# Patient Record
Sex: Female | Born: 1935
Health system: Southern US, Community
[De-identification: ages and names within clinical notes are randomized; demographics above are authoritative.]

## PROBLEM LIST (undated history)

## (undated) DIAGNOSIS — M1990 Unspecified osteoarthritis, unspecified site: Secondary | ICD-10-CM

## (undated) DIAGNOSIS — I4891 Unspecified atrial fibrillation: Secondary | ICD-10-CM

## (undated) DIAGNOSIS — S82853A Displaced trimalleolar fracture of unspecified lower leg, initial encounter for closed fracture: Secondary | ICD-10-CM

## (undated) DIAGNOSIS — I700 Atherosclerosis of aorta: Secondary | ICD-10-CM

## (undated) DIAGNOSIS — J449 Chronic obstructive pulmonary disease, unspecified: Secondary | ICD-10-CM

## (undated) DIAGNOSIS — G629 Polyneuropathy, unspecified: Secondary | ICD-10-CM

## (undated) DIAGNOSIS — L089 Local infection of the skin and subcutaneous tissue, unspecified: Secondary | ICD-10-CM

## (undated) DIAGNOSIS — T148XXA Other injury of unspecified body region, initial encounter: Secondary | ICD-10-CM

## (undated) DIAGNOSIS — E039 Hypothyroidism, unspecified: Secondary | ICD-10-CM

## (undated) DIAGNOSIS — I509 Heart failure, unspecified: Secondary | ICD-10-CM

## (undated) DIAGNOSIS — L98429 Non-pressure chronic ulcer of back with unspecified severity: Secondary | ICD-10-CM

## (undated) HISTORY — DX: Polyneuropathy, unspecified: G62.9

## (undated) HISTORY — DX: Unspecified atrial fibrillation: I48.91

## (undated) HISTORY — DX: Hypothyroidism, unspecified: E03.9

## (undated) HISTORY — DX: Unspecified osteoarthritis, unspecified site: M19.90

## (undated) HISTORY — DX: Displaced trimalleolar fracture of unspecified lower leg, initial encounter for closed fracture: S82.853A

## (undated) HISTORY — PX: VAGINAL HYSTERECTOMY: SUR661

---

## 2004-06-17 ENCOUNTER — Ambulatory Visit: Payer: Self-pay | Admitting: Family Medicine

## 2004-07-08 ENCOUNTER — Ambulatory Visit: Payer: Self-pay | Admitting: Family Medicine

## 2004-08-19 ENCOUNTER — Ambulatory Visit: Payer: Self-pay | Admitting: Family Medicine

## 2004-09-22 ENCOUNTER — Ambulatory Visit: Payer: Self-pay | Admitting: Family Medicine

## 2004-10-22 ENCOUNTER — Ambulatory Visit: Payer: Self-pay | Admitting: Family Medicine

## 2005-05-28 ENCOUNTER — Ambulatory Visit: Payer: Self-pay | Admitting: Family Medicine

## 2005-08-19 ENCOUNTER — Ambulatory Visit (HOSPITAL_COMMUNITY): Admission: RE | Admit: 2005-08-19 | Discharge: 2005-08-19 | Payer: Self-pay | Admitting: Family Medicine

## 2007-01-31 ENCOUNTER — Encounter (HOSPITAL_COMMUNITY): Admission: RE | Admit: 2007-01-31 | Discharge: 2007-04-19 | Payer: Self-pay | Admitting: Neurology

## 2007-03-02 DIAGNOSIS — I4891 Unspecified atrial fibrillation: Secondary | ICD-10-CM | POA: Insufficient documentation

## 2007-03-02 DIAGNOSIS — E039 Hypothyroidism, unspecified: Secondary | ICD-10-CM | POA: Insufficient documentation

## 2007-03-02 DIAGNOSIS — H409 Unspecified glaucoma: Secondary | ICD-10-CM | POA: Insufficient documentation

## 2007-03-02 DIAGNOSIS — L408 Other psoriasis: Secondary | ICD-10-CM | POA: Insufficient documentation

## 2007-05-09 ENCOUNTER — Encounter (HOSPITAL_COMMUNITY): Admission: RE | Admit: 2007-05-09 | Discharge: 2007-08-07 | Payer: Self-pay | Admitting: Neurology

## 2007-12-26 ENCOUNTER — Encounter: Admission: RE | Admit: 2007-12-26 | Discharge: 2007-12-26 | Payer: Self-pay | Admitting: Family Medicine

## 2008-01-05 ENCOUNTER — Encounter: Admission: RE | Admit: 2008-01-05 | Discharge: 2008-01-05 | Payer: Self-pay | Admitting: Family Medicine

## 2008-02-07 ENCOUNTER — Emergency Department (HOSPITAL_COMMUNITY): Admission: EM | Admit: 2008-02-07 | Discharge: 2008-02-07 | Payer: Self-pay | Admitting: Emergency Medicine

## 2008-02-09 ENCOUNTER — Inpatient Hospital Stay (HOSPITAL_COMMUNITY): Admission: RE | Admit: 2008-02-09 | Discharge: 2008-02-11 | Payer: Self-pay | Admitting: Orthopedic Surgery

## 2010-11-16 ENCOUNTER — Emergency Department (HOSPITAL_COMMUNITY): Payer: Medicare Other

## 2010-11-16 ENCOUNTER — Inpatient Hospital Stay (HOSPITAL_COMMUNITY)
Admission: EM | Admit: 2010-11-16 | Discharge: 2010-11-29 | DRG: 492 | Disposition: A | Discharge status: Home Health | Payer: Medicare Other | Attending: Internal Medicine | Admitting: Internal Medicine

## 2010-11-16 DIAGNOSIS — Z7901 Long term (current) use of anticoagulants: Secondary | ICD-10-CM

## 2010-11-16 DIAGNOSIS — Z87311 Personal history of (healed) other pathological fracture: Secondary | ICD-10-CM

## 2010-11-16 DIAGNOSIS — D62 Acute posthemorrhagic anemia: Secondary | ICD-10-CM | POA: Diagnosis not present

## 2010-11-16 DIAGNOSIS — S82853A Displaced trimalleolar fracture of unspecified lower leg, initial encounter for closed fracture: Principal | ICD-10-CM | POA: Diagnosis present

## 2010-11-16 DIAGNOSIS — K644 Residual hemorrhoidal skin tags: Secondary | ICD-10-CM | POA: Diagnosis present

## 2010-11-16 DIAGNOSIS — D126 Benign neoplasm of colon, unspecified: Secondary | ICD-10-CM | POA: Diagnosis present

## 2010-11-16 DIAGNOSIS — E876 Hypokalemia: Secondary | ICD-10-CM | POA: Diagnosis not present

## 2010-11-16 DIAGNOSIS — E039 Hypothyroidism, unspecified: Secondary | ICD-10-CM | POA: Diagnosis present

## 2010-11-16 DIAGNOSIS — J9600 Acute respiratory failure, unspecified whether with hypoxia or hypercapnia: Secondary | ICD-10-CM | POA: Diagnosis not present

## 2010-11-16 DIAGNOSIS — I4891 Unspecified atrial fibrillation: Secondary | ICD-10-CM | POA: Diagnosis present

## 2010-11-16 DIAGNOSIS — F329 Major depressive disorder, single episode, unspecified: Secondary | ICD-10-CM | POA: Diagnosis present

## 2010-11-16 DIAGNOSIS — H9190 Unspecified hearing loss, unspecified ear: Secondary | ICD-10-CM | POA: Diagnosis present

## 2010-11-16 DIAGNOSIS — K648 Other hemorrhoids: Secondary | ICD-10-CM | POA: Diagnosis present

## 2010-11-16 DIAGNOSIS — W010XXA Fall on same level from slipping, tripping and stumbling without subsequent striking against object, initial encounter: Secondary | ICD-10-CM | POA: Diagnosis present

## 2010-11-16 DIAGNOSIS — M810 Age-related osteoporosis without current pathological fracture: Secondary | ICD-10-CM | POA: Diagnosis present

## 2010-11-16 DIAGNOSIS — G609 Hereditary and idiopathic neuropathy, unspecified: Secondary | ICD-10-CM | POA: Diagnosis present

## 2010-11-16 DIAGNOSIS — F3289 Other specified depressive episodes: Secondary | ICD-10-CM | POA: Diagnosis present

## 2010-11-16 DIAGNOSIS — J189 Pneumonia, unspecified organism: Secondary | ICD-10-CM | POA: Diagnosis not present

## 2010-11-16 DIAGNOSIS — Y92009 Unspecified place in unspecified non-institutional (private) residence as the place of occurrence of the external cause: Secondary | ICD-10-CM

## 2010-11-16 HISTORY — PX: COLONOSCOPY W/ POLYPECTOMY: SHX1380

## 2010-11-16 LAB — CBC
HCT: 36.9 % (ref 36.0–46.0)
HCT: 34.7 % — ABNORMAL LOW (ref 36.0–46.0)
Hemoglobin: 11.2 g/dL — ABNORMAL LOW (ref 12.0–15.0)
MCH: 30.9 pg (ref 26.0–34.0)
MCH: 29.9 pg (ref 26.0–34.0)
MCV: 94.1 fL (ref 78.0–100.0)
MCV: 92.5 fL (ref 78.0–100.0)
RBC: 3.92 MIL/uL (ref 3.87–5.11)
RBC: 3.75 MIL/uL — ABNORMAL LOW (ref 3.87–5.11)
RDW: 12.8 % (ref 11.5–15.5)
WBC: 8.7 10*3/uL (ref 4.0–10.5)

## 2010-11-16 LAB — DIFFERENTIAL
Eosinophils Relative: 1 % (ref 0–5)
Lymphocytes Relative: 6 % — ABNORMAL LOW (ref 12–46)
Lymphs Abs: 0.6 10*3/uL — ABNORMAL LOW (ref 0.7–4.0)
Monocytes Relative: 4 % (ref 3–12)

## 2010-11-16 LAB — BASIC METABOLIC PANEL
BUN: 25 mg/dL — ABNORMAL HIGH (ref 6–23)
CO2: 24 mEq/L (ref 19–32)
Chloride: 108 mEq/L (ref 96–112)
Creatinine, Ser: 1.23 mg/dL — ABNORMAL HIGH (ref 0.4–1.2)
Glucose, Bld: 113 mg/dL — ABNORMAL HIGH (ref 70–99)

## 2010-11-16 LAB — POCT I-STAT, CHEM 8
BUN: 28 mg/dL — ABNORMAL HIGH (ref 6–23)
Calcium, Ion: 1.24 mmol/L (ref 1.12–1.32)
Chloride: 108 mEq/L (ref 96–112)
Glucose, Bld: 118 mg/dL — ABNORMAL HIGH (ref 70–99)
TCO2: 25 mmol/L (ref 0–100)

## 2010-11-17 LAB — PROTIME-INR: INR: 2.44 — ABNORMAL HIGH (ref 0.00–1.49)

## 2010-11-18 LAB — BASIC METABOLIC PANEL
BUN: 15 mg/dL (ref 6–23)
Chloride: 105 mEq/L (ref 96–112)
Potassium: 3.7 mEq/L (ref 3.5–5.1)

## 2010-11-18 LAB — CBC
HCT: 31.1 % — ABNORMAL LOW (ref 36.0–46.0)
Hemoglobin: 10 g/dL — ABNORMAL LOW (ref 12.0–15.0)
MCH: 30.5 pg (ref 26.0–34.0)
MCHC: 32.2 g/dL (ref 30.0–36.0)
MCV: 94.8 fL (ref 78.0–100.0)

## 2010-11-18 LAB — PROTIME-INR: INR: 3.03 — ABNORMAL HIGH (ref 0.00–1.49)

## 2010-11-18 LAB — BRAIN NATRIURETIC PEPTIDE: Pro B Natriuretic peptide (BNP): 293 pg/mL — ABNORMAL HIGH (ref 0.0–100.0)

## 2010-11-19 LAB — HEPARIN LEVEL (UNFRACTIONATED): Heparin Unfractionated: <0.1 IU/mL — ABNORMAL LOW (ref 0.30–0.70)

## 2010-11-19 NOTE — H&P (Signed)
[name], [name]                ACCOUNT NO.:  [id number]  MEDICAL RECORD NO.:  [id number]           PATIENT TYPE:  I  LOCATION:  1440                         FACILITY:  [facility]  PHYSICIAN:  Della Goo, M.D. DATE OF BIRTH:  August 13, 1927  DATE OF ADMISSION:  11/16/2010 DATE OF DISCHARGE:                             HISTORY & PHYSICAL   PRIMARY CARE PHYSICIAN:  Dr. Aida Puffer.  CHIEF COMPLAINT:  Fall, left ankle pain.  HISTORY OF PRESENT ILLNESS:  This is an 77 year old female who was brought to the emergency department emergently after suffering a fall. The patient denies having any syncope or dizziness associated with the fall.  She reports having neuropathy and occasionally falls and fell this afternoon and suffered intense left ankle pain.  EMS was called. The patient was brought to the emergency department, evaluated and x- rays were performed which revealed a left trimalleolar fracture, dislocation with oblique fracture of the distal fibular diaphysis and this was noted as being mildly comminuted.  The orthopedic service was called.  The patient regularly sees Dr. Brynda Greathouse and Dr. Priscille Kluver was the on-call consultant.  The patient was referred for medical service for admission since she has multiple medical problems and is on Coumadin therapy.  There are no plans for immediate surgery at this time.  The patient was placed in a splint in the emergency department.  PAST MEDICAL HISTORY:  Significant for: 1. Atrial fibrillation, on chronic Coumadin therapy. 2. Hypothyroidism. 3. Glaucoma. 4. Peripheral neuropathy. 5. Osteoporosis. 6. Osteoarthritis. 7. She is extremely hard-of-hearing and has bilateral hearing aids.  PAST SURGICAL HISTORY: 1. History of hysterectomy. 2. History of an open reduction internal fixation of the right lower     tibia and fibula fracture.  MEDICATIONS:  Her medications at this time include: 1. Alendronate 70 mg 1 p.o. q. week. 2. Coumadin  2.5 mg on Mondays, Wednesdays, Fridays and 5 mg on the     other days. 3. Vitamin D2 50,000 units once weekly. 4. Fluoxetine 20 mg one p.o. daily. 5. Calcium tablets 1 p.o. b.i.d. 6. Levothyroxine 112 mcg 1 p.o. daily. 7. Oxycodone/APAP 5/225 one p.o. b.i.d. p.r.n. pain. 8. Pilocarpine ophthalmic drops 4%, 1 drop both eyes twice daily. 9. Travatan ophthalmic drops 0.004% 1 drop to both eyes once daily. 10.Dorzolamide/Timolol 2.23%/0.66% 1 drop both eyes twice daily. 11.The patient is on an eye vitamin that she takes daily.  ALLERGIES:  No known drug allergies.  SOCIAL HISTORY:  The patient lives alone.  She is a nonsmoker, nondrinker.  No history of illicit drug usage.  FAMILY HISTORY:  Noncontributory.  REVIEW OF SYSTEMS:  Pertinents mentioned above.  They are all negative.  PHYSICAL EXAMINATION FINDINGS:  GENERAL:  This is an elderly thin, frail 77 year old Caucasian female who is in no acute distress. VITAL SIGNS:  Temperature 97.4, blood pressure 131/90, heart rate 84, respirations 20, O2 sats 95 to 97%. HEENT EXAMINATION:  Normocephalic, atraumatic.  Pupils equally round and reactive to light.  Extraocular movements intact.  Funduscopic unable to visualize at this time.  Nares are patent bilaterally.  Oropharynx is clear. NECK:  Supple.  Full range of motion.  No thyromegaly, adenopathy, jugular venous distention. CARDIOVASCULAR:  Irregular rhythm.  No murmurs, gallops or rubs appreciated. LUNGS:  Clear to auscultation bilaterally.  No rales, rhonchi or wheezes.  Normal excursion bilaterally.  Breathing is unlabored. ABDOMEN:  Positive bowel sounds, soft, nontender, nondistended.  No hepatosplenomegaly. EXTREMITIES:  The right lower extremity is without cyanosis, clubbing or edema.  The left lower extremity is immobilized at this time in a temporary splint/cast. NEUROLOGIC EXAMINATION:  Grossly nonfocal except for her hearing and visual deficits.  LABORATORY STUDIES:   White blood cell count 8.7, hemoglobin 12.1, hematocrit 36.9, MCV 94.1, platelets 197, neutrophils 89%, lymphocytes 6%.  Sodium 141, potassium 4.3, chloride 108.  CO2 25, BUN 28, creatinine 1.4 and glucose 118, ionized calcium 1.24.  Protime 25.9, INR 2.36.  Left ankle x-ray as mentioned above in the HPI.  Chest x-ray performed revealed clear lung fields.  ASSESSMENT:  An 77 year old female being admitted with: 1. Left ankle fracture, status post mechanical fall. 2. Peripheral neuropathy. 3. Atrial fibrillation, on chronic Coumadin therapy. 4. Coagulopathy secondary to Coumadin therapy. 5. Osteoporosis. 6. Hypothyroidism. 7. Glaucoma.  PLAN:  The patient will be admitted to telemetry area for monitoring. The patient will be placed on pain medication as needed for her pain. The patient's regular medications will be reconciled and the orthopedic service will see the patient for formal consultation in the a.m.  Dr. Brynda Greathouse is on call coverage.  The patient will continue on her Coumadin therapy until concrete plans for surgical intervention have been made.  A TSH level will be ordered and laboratory studies will be ordered in the a.m. and corrected as needed.  The patient is a full code at this time.     Della Goo, M.D.     HJ/MEDQ  D:  11/16/2010  T:  11/16/2010  Job:  [id number]  cc:   Aida Puffer Fax: [phone]  Electronically Signed by Della Goo M.D. on 11/19/2010 06:28:46 AM

## 2010-11-20 LAB — HEPARIN LEVEL (UNFRACTIONATED)
Heparin Unfractionated: 0.26 IU/mL — ABNORMAL LOW (ref 0.30–0.70)
Heparin Unfractionated: 0.46 IU/mL (ref 0.30–0.70)

## 2010-11-20 LAB — PROTIME-INR: INR: 1.53 — ABNORMAL HIGH (ref 0.00–1.49)

## 2010-11-21 ENCOUNTER — Inpatient Hospital Stay (HOSPITAL_COMMUNITY): Payer: Medicare Other

## 2010-11-21 DIAGNOSIS — J9600 Acute respiratory failure, unspecified whether with hypoxia or hypercapnia: Secondary | ICD-10-CM

## 2010-11-21 DIAGNOSIS — J810 Acute pulmonary edema: Secondary | ICD-10-CM

## 2010-11-21 LAB — BLOOD GAS, ARTERIAL
Acid-base deficit: 11.6 mmol/L — ABNORMAL HIGH (ref 0.0–2.0)
Acid-base deficit: 5.8 mmol/L — ABNORMAL HIGH (ref 0.0–2.0)
Acid-base deficit: 3.6 mmol/L — ABNORMAL HIGH (ref 0.0–2.0)
Bicarbonate: 21.3 mEq/L (ref 20.0–24.0)
Drawn by: 30996
Drawn by: 103701
FIO2: 0.4 %
MECHVT: 500 mL
MECHVT: 500 mL
O2 Saturation: 98.3 %
O2 Saturation: 99.4 %
Patient temperature: 98.6
Patient temperature: 98.6
RATE: 14 resp/min
RATE: 14 resp/min
TCO2: 22.2 mmol/L (ref 0–100)
TCO2: 18.8 mmol/L (ref 0–100)
pH, Arterial: 6.982 — CL (ref 7.350–7.400)
pH, Arterial: 7.296 — ABNORMAL LOW (ref 7.350–7.400)

## 2010-11-21 LAB — CBC
MCH: 31.2 pg (ref 26.0–34.0)
MCHC: 33.8 g/dL (ref 30.0–36.0)
MCV: 92.3 fL (ref 78.0–100.0)
Platelets: 191 10*3/uL (ref 150–400)
RDW: 12.6 % (ref 11.5–15.5)
WBC: 5.2 10*3/uL (ref 4.0–10.5)

## 2010-11-21 LAB — BASIC METABOLIC PANEL
BUN: 9 mg/dL (ref 6–23)
Calcium: 8.6 mg/dL (ref 8.4–10.5)
Creatinine, Ser: 0.84 mg/dL (ref 0.4–1.2)
GFR calc non Af Amer: >60 mL/min (ref >60)

## 2010-11-21 LAB — CARDIAC PANEL(CRET KIN+CKTOT+MB+TROPI): Troponin I: 0.03 ng/mL (ref 0.00–0.06)

## 2010-11-21 LAB — ABO/RH: ABO/RH(D): A POS

## 2010-11-21 LAB — TYPE AND SCREEN: ABO/RH(D): A POS

## 2010-11-21 LAB — HEPARIN LEVEL (UNFRACTIONATED): Heparin Unfractionated: 0.51 IU/mL (ref 0.30–0.70)

## 2010-11-22 ENCOUNTER — Inpatient Hospital Stay (HOSPITAL_COMMUNITY): Payer: Medicare Other

## 2010-11-22 DIAGNOSIS — I4891 Unspecified atrial fibrillation: Secondary | ICD-10-CM

## 2010-11-22 DIAGNOSIS — I059 Rheumatic mitral valve disease, unspecified: Secondary | ICD-10-CM

## 2010-11-22 LAB — CBC
MCH: 30.1 pg (ref 26.0–34.0)
MCHC: 32.3 g/dL (ref 30.0–36.0)
MCV: 93.2 fL (ref 78.0–100.0)
Platelets: 217 10*3/uL (ref 150–400)
RDW: 12.6 % (ref 11.5–15.5)

## 2010-11-22 LAB — CARDIAC PANEL(CRET KIN+CKTOT+MB+TROPI)
Total CK: 175 U/L (ref 7–177)
Troponin I: 0.05 ng/mL (ref 0.00–0.06)

## 2010-11-22 LAB — BLOOD GAS, ARTERIAL
Acid-base deficit: 1.6 mmol/L (ref 0.0–2.0)
Drawn by: 340271
FIO2: 0.3 %
RATE: 14 resp/min
pCO2 arterial: 29.7 mmHg — ABNORMAL LOW (ref 35.0–45.0)
pO2, Arterial: 98.4 mmHg (ref 80.0–100.0)

## 2010-11-22 LAB — HEPARIN LEVEL (UNFRACTIONATED): Heparin Unfractionated: 0.44 IU/mL (ref 0.30–0.70)

## 2010-11-22 LAB — COMPREHENSIVE METABOLIC PANEL
AST: 178 U/L — ABNORMAL HIGH (ref 0–37)
BUN: 9 mg/dL (ref 6–23)
CO2: 23 mEq/L (ref 19–32)
Chloride: 105 mEq/L (ref 96–112)
Creatinine, Ser: 0.91 mg/dL (ref 0.4–1.2)
GFR calc non Af Amer: 59 mL/min — ABNORMAL LOW (ref >60)
Total Bilirubin: 1.2 mg/dL (ref 0.3–1.2)

## 2010-11-22 LAB — URINALYSIS, ROUTINE W REFLEX MICROSCOPIC
Glucose, UA: NEGATIVE mg/dL
Ketones, ur: 40 mg/dL — AB
Protein, ur: NEGATIVE mg/dL

## 2010-11-22 LAB — BRAIN NATRIURETIC PEPTIDE: Pro B Natriuretic peptide (BNP): 496 pg/mL — ABNORMAL HIGH (ref 0.0–100.0)

## 2010-11-22 LAB — VITAMIN B12: Vitamin B-12: 1091 pg/mL — ABNORMAL HIGH (ref 211–911)

## 2010-11-22 LAB — IRON AND TIBC: Iron: 22 ug/dL — ABNORMAL LOW (ref 42–135)

## 2010-11-23 ENCOUNTER — Inpatient Hospital Stay (HOSPITAL_COMMUNITY): Payer: Medicare Other

## 2010-11-23 DIAGNOSIS — I4891 Unspecified atrial fibrillation: Secondary | ICD-10-CM

## 2010-11-23 LAB — URINE CULTURE
Colony Count: NO GROWTH
Culture: NO GROWTH
Special Requests: NEGATIVE

## 2010-11-23 LAB — BASIC METABOLIC PANEL
BUN: 9 mg/dL (ref 6–23)
CO2: 24 mEq/L (ref 19–32)
Chloride: 106 mEq/L (ref 96–112)
Creatinine, Ser: 1.01 mg/dL (ref 0.4–1.2)
Glucose, Bld: 76 mg/dL (ref 70–99)

## 2010-11-23 LAB — CBC
MCH: 30 pg (ref 26.0–34.0)
MCHC: 32.6 g/dL (ref 30.0–36.0)
MCV: 92.2 fL (ref 78.0–100.0)
Platelets: 197 10*3/uL (ref 150–400)
RDW: 12.8 % (ref 11.5–15.5)
WBC: 6.2 10*3/uL (ref 4.0–10.5)

## 2010-11-23 LAB — CARDIAC PANEL(CRET KIN+CKTOT+MB+TROPI): Relative Index: 0.4 (ref 0.0–2.5)

## 2010-11-24 DIAGNOSIS — I4891 Unspecified atrial fibrillation: Secondary | ICD-10-CM

## 2010-11-24 DIAGNOSIS — J9600 Acute respiratory failure, unspecified whether with hypoxia or hypercapnia: Secondary | ICD-10-CM

## 2010-11-24 DIAGNOSIS — J810 Acute pulmonary edema: Secondary | ICD-10-CM

## 2010-11-24 LAB — CULTURE, BAL-QUANTITATIVE W GRAM STAIN: Colony Count: NO GROWTH

## 2010-11-24 LAB — BASIC METABOLIC PANEL
CO2: 27 mEq/L (ref 19–32)
Calcium: 8.5 mg/dL (ref 8.4–10.5)
Chloride: 107 mEq/L (ref 96–112)
Glucose, Bld: 93 mg/dL (ref 70–99)
Sodium: 139 mEq/L (ref 135–145)

## 2010-11-24 LAB — CBC
Hemoglobin: 8 g/dL — ABNORMAL LOW (ref 12.0–15.0)
MCH: 30.3 pg (ref 26.0–34.0)
MCHC: 32.7 g/dL (ref 30.0–36.0)
MCV: 92.8 fL (ref 78.0–100.0)

## 2010-11-24 LAB — CARDIAC PANEL(CRET KIN+CKTOT+MB+TROPI): CK, MB: 1.8 ng/mL (ref 0.3–4.0)

## 2010-11-24 LAB — HEPARIN LEVEL (UNFRACTIONATED): Heparin Unfractionated: 0.34 IU/mL (ref 0.30–0.70)

## 2010-11-25 ENCOUNTER — Inpatient Hospital Stay (HOSPITAL_COMMUNITY): Payer: Medicare Other

## 2010-11-25 LAB — BASIC METABOLIC PANEL
CO2: 25 mEq/L (ref 19–32)
Calcium: 8.9 mg/dL (ref 8.4–10.5)
Chloride: 111 mEq/L (ref 96–112)
GFR calc Af Amer: >60 mL/min (ref >60)
Glucose, Bld: 97 mg/dL (ref 70–99)
Potassium: 3.5 mEq/L (ref 3.5–5.1)
Sodium: 142 mEq/L (ref 135–145)

## 2010-11-25 LAB — CBC
HCT: 25.3 % — ABNORMAL LOW (ref 36.0–46.0)
Hemoglobin: 8.3 g/dL — ABNORMAL LOW (ref 12.0–15.0)
MCH: 30.5 pg (ref 26.0–34.0)
MCHC: 32.8 g/dL (ref 30.0–36.0)
MCV: 93 fL (ref 78.0–100.0)
RBC: 2.72 MIL/uL — ABNORMAL LOW (ref 3.87–5.11)

## 2010-11-26 LAB — BASIC METABOLIC PANEL
CO2: 25 mEq/L (ref 19–32)
Chloride: 108 mEq/L (ref 96–112)
Creatinine, Ser: 0.88 mg/dL (ref 0.4–1.2)
GFR calc Af Amer: >60 mL/min (ref >60)
Glucose, Bld: 91 mg/dL (ref 70–99)

## 2010-11-26 LAB — DIFFERENTIAL
Basophils Absolute: 0 10*3/uL (ref 0.0–0.1)
Basophils Relative: 0 % (ref 0–1)
Eosinophils Relative: 3 % (ref 0–5)
Lymphocytes Relative: 21 % (ref 12–46)
Lymphs Abs: 1.3 10*3/uL (ref 0.7–4.0)
Monocytes Relative: 10 % (ref 3–12)
Neutro Abs: 3.9 10*3/uL (ref 1.7–7.7)

## 2010-11-26 LAB — CBC
HCT: 19.6 % — ABNORMAL LOW (ref 36.0–46.0)
Hemoglobin: 6.5 g/dL — CL (ref 12.0–15.0)
MCH: 30.4 pg (ref 26.0–34.0)
RBC: 2.14 MIL/uL — ABNORMAL LOW (ref 3.87–5.11)

## 2010-11-26 LAB — PROTIME-INR
INR: 1.48 (ref 0.00–1.49)
Prothrombin Time: 18.1 seconds — ABNORMAL HIGH (ref 11.6–15.2)

## 2010-11-26 LAB — TSH: TSH: 0.205 u[IU]/mL — ABNORMAL LOW (ref 0.350–4.500)

## 2010-11-27 DIAGNOSIS — D649 Anemia, unspecified: Secondary | ICD-10-CM

## 2010-11-27 DIAGNOSIS — K921 Melena: Secondary | ICD-10-CM

## 2010-11-27 LAB — CROSSMATCH
ABO/RH(D): A POS
Antibody Screen: NEGATIVE
Unit division: 0

## 2010-11-27 LAB — CBC
HCT: 32 % — ABNORMAL LOW (ref 36.0–46.0)
MCH: 29.1 pg (ref 26.0–34.0)
MCHC: 32.8 g/dL (ref 30.0–36.0)
MCV: 88.6 fL (ref 78.0–100.0)
RDW: 15 % (ref 11.5–15.5)

## 2010-11-27 LAB — BASIC METABOLIC PANEL
BUN: 5 mg/dL — ABNORMAL LOW (ref 6–23)
CO2: 24 mEq/L (ref 19–32)
Chloride: 111 mEq/L (ref 96–112)
Creatinine, Ser: 0.81 mg/dL (ref 0.4–1.2)
Glucose, Bld: 94 mg/dL (ref 70–99)

## 2010-11-27 NOTE — Consult Note (Signed)
NAMEFRADEL, BALDONADO                ACCOUNT NO.:  [id number]  MEDICAL RECORD NO.:  [id number]           PATIENT TYPE:  I  LOCATION:  1231                         FACILITY:  [facility]  PHYSICIAN:  Cassell Clement, M.D. DATE OF BIRTH:  1927-06-15  DATE OF CONSULTATION:  11/22/2010 DATE OF DISCHARGE:                                CONSULTATION   HISTORY:  We were asked to see this pleasant 77 year old Caucasian female in regard to management of her postoperative atrial fibrillation with rapid ventricular response.  The patient was admitted on November 16, 2010, with a fractured ankle.  She underwent surgery on her ankle on November 20, 2010.  Postoperatively, she required reintubation in the PACU because of increased respiratory distress secondary to atrial fibrillation with a rapid ventricular response and probable congestive heart failure.  She remains on a ventilator today.  Overnight, her ventricular response remained rapid and she is now on an IV Cardizem drip at 5 mg per minute.  The patient does have a history of chronic atrial fibrillation.  She is a former patient of Dr. Alonza Smoker.  He attempted to have her see a cardiologist years ago but she refused.  She has been on long-term Coumadin.  She now sees Dr. Aida Puffer in [place] who manages her Coumadin.  He has also talked to her about seeing a cardiologist and having a workup of her atrial fibrillation but she has refused.  The patient has been on long-term warfarin but has not been on any long-term AV blocking drugs.  The patient does not have any history of known angina pectoris, according to the family.  She does not have any history of a myocardial infarction.  The patient has been noted to have exertional dyspnea by the family.  The patient is relatively sedentary but walks with a walker at home and manages her own household at home.  The patient does not have any history of diabetes mellitus. She does have a history of a severe  idiopathic peripheral neuropathy which was unresponsive to IV gammaglobulin or to p.o. Neurontin.  She previously had seen Dr. Avie Echevaria for evaluation and treatment of her neuropathy.  The patient does not have any past history of high blood pressure, according to the family.  FAMILY HISTORY:  Her family history is positive for congestive heart failure and myocardial infarctions in her parents and in the brother.  SOCIAL HISTORY:  Reveals that she does not use alcohol or tobacco.  She is a widow.  She has 2 children, in good health.  She lives by herself but family members live nearby.  PAST SURGICAL HISTORY:  Positive for open reduction and internal fixation of a right lower tibia and fibula fracture several years ago and since then she has walked with a walker.  The patient also has a history of remote total hysterectomy.  PAST MEDICAL HISTORY:  Positive for hypothyroidism, glaucoma, peripheral neuropathy, osteoporosis with compression fractures, osteoarthritis, atrial fibrillation and on Coumadin, and extremely hard of hearing and wears bilateral hearing aids.  REVIEW OF SYSTEMS:  Not obtainable at present since the patient is on  a ventilator and is sedated.  PHYSICAL EXAMINATION:  VITAL SIGNS:  On physical examination, blood pressure is 106/66, pulse is 106 on IV Cardizem 5 mg per hour.  The patient is sedated on a vent.  She is in no respiratory distress.  Color is good. SKIN:  Warm and dry. HEENT:  Head and neck exam unremarkable.  Carotids reveal no bruits. The jugular venous pressure is normal.  The thyroid is prominent.  There is no lymphadenopathy. CHEST:  The chest reveals a few scattered rhonchi. HEART:  Reveals no murmur, gallop, rub or click.  Aortic closure sound is crisp. ABDOMEN:  The abdomen is soft and nontender without palpable masses or organomegaly. EXTREMITIES:  Show 1+ right dorsalis pedis pulse.  Left foot is a cast  LABORATORY DATA AND RADIOLOGIC  STUDIES:  Chest x-ray shows mild congestive heart failure, heart size is upper limit of normal.  Laboratory work includes sodium 134, potassium 4.1, BUN 9, creatinine 0.91.  Her B natriuretic peptide is elevated at 496, hemoglobin is 10.1, CK-MB is 1.8 and troponin 0.03 yesterday with repeat values pending today.  Her electrocardiogram dated November 22, 2010, shows atrial fib with a rapid ventricular response and she has increased lateral ST-segment depression when compared to previous EKGs.  She has occasional PVCs.  IMPRESSION: 1. Chronic atrial fibrillation, now with rapid ventricular response     secondary to stress of surgery, rule out contributing causes such     as "silent" myocardial infarction and repeat cardiac enzymes have     been ordered for this morning. 2. Congestive heart failure, etiology to be determined and her echo     has been done and will be reviewed later today at [facility]  RECOMMENDATIONS:  Continue rate control with low-dose IV Cardizem at this point.  If rate increases further later, we can consider increasing the dose of Cardizem of her soft blood pressure will allow it.  Would continue gentle diuresis with Lasix.  We will follow with you.  Her long- term goal will be to reestablish to oral anticoagulation with Coumadin and to assure adequate rate control with oral agent  Many thanks for the opportunity to see this pleasant woman with you.          ______________________________ Cassell Clement, M.D.     TB/MEDQ  D:  11/22/2010  T:  11/22/2010  Job:  [id number]  cc:   Charlcie Cradle. Delford Field, MD, FCCP [address] [place] [address]  Aida Puffer Fax: [phone]  Toni Arthurs, MD Fax: [phone]  Electronically Signed by Cassell Clement M.D. on 11/27/2010 12:32:17 PM

## 2010-11-28 ENCOUNTER — Other Ambulatory Visit: Payer: Self-pay | Admitting: Gastroenterology

## 2010-11-28 DIAGNOSIS — D126 Benign neoplasm of colon, unspecified: Secondary | ICD-10-CM

## 2010-11-28 DIAGNOSIS — K5730 Diverticulosis of large intestine without perforation or abscess without bleeding: Secondary | ICD-10-CM

## 2010-11-28 LAB — CBC
HCT: 36.8 % (ref 36.0–46.0)
MCHC: 33.4 g/dL (ref 30.0–36.0)
MCV: 89.3 fL (ref 78.0–100.0)
RDW: 14.8 % (ref 11.5–15.5)
WBC: 6.9 10*3/uL (ref 4.0–10.5)

## 2010-11-28 LAB — CULTURE, BLOOD (ROUTINE X 2)
Culture  Setup Time: 201204071719
Culture: NO GROWTH

## 2010-11-29 LAB — BASIC METABOLIC PANEL
CO2: 22 mEq/L (ref 19–32)
Chloride: 110 mEq/L (ref 96–112)
Creatinine, Ser: 0.78 mg/dL (ref 0.4–1.2)
GFR calc Af Amer: >60 mL/min (ref >60)

## 2010-11-29 LAB — CBC
HCT: 36.6 % (ref 36.0–46.0)
MCV: 88.8 fL (ref 78.0–100.0)
RDW: 14.5 % (ref 11.5–15.5)
WBC: 6.6 10*3/uL (ref 4.0–10.5)

## 2010-12-01 NOTE — Discharge Summary (Signed)
NAMEMCKENZEE, BEEM                ACCOUNT NO.:  [id number]  MEDICAL RECORD NO.:  [id number]           PATIENT TYPE:  I  LOCATION:  1519                         FACILITY:  [facility]  PHYSICIAN:  Conley Canal, MD      DATE OF BIRTH:  09-10-1926  DATE OF ADMISSION:  11/16/2010 DATE OF DISCHARGE:                         DISCHARGE SUMMARY-REFERRING   PROBLEM LIST: 1. Gastrointestinal bleeding on anticoagulation. 2. Left ankle fracture after a fall status post open reduction and     internal fixation of left ankle trimalleolar fracture. 3. Atrial fibrillation, chronically anticoagulated. 4. Hypothyroidism. 5. Peripheral neuropathy. 6. Osteoporosis. 7. Osteoarthritis. 8. Hard-of-hearing. 9. Glaucoma. 10.Acute blood loss anemia requiring PRBC transfusion related to     rectal bleeding. 11.Pneumonia, day 6 of antibiotics.  CONSULTING PHYSICIANS:  Pulmonary and Critical Care, Dr. Toni Arthurs Orthopedics, and Dr. Cassell Clement Cardiology, [facility].  PROCEDURES PERFORMED: 1. Endotracheal intubation for ventilator support. 2. Open reduction and internal fixation of left ankle trimalleolar     fracture on November 21, 2010. 3. Chest x-ray, latest one on November 25, 2010, which showed bibasilar     linear atelectasis. 4. X-ray of the left ankle on November 16, 2010, showed unstable     trimalleolar fracture dislocation of the ankle. 5. 2-D echocardiogram on November 22, 2010, showed EF 50% to 55%, trivial     aortic regurgitation, and mild mitral regurgitation.  HOSPITAL COURSE:  [name] is a pleasant 77 year old female who came in after a mechanical fall at home resulting in trimalleolar fracture, which was eventually fixed by Dr. Toni Arthurs; however, the patient required prolonged ventilator support and was thought to have some pneumonia for which she is on moxifloxacin to finish course of antibiotics on November 28, 2010.  The patient was also noted to have AFib with rapid  ventricular response, hence seen by Cardiology, and now rate controlled on Lopressor; however, she developed rectal bleeding with hemoglobin falling from 12 to 6.5 yesterday, but has responded to 2 units of PRBC transfusion with hemoglobin of 10.5, hematocrit 32 today. The patient was taken off Coumadin and heparin due to the bleeding and she will be seen by [facility] today for further evaluation and to make recommendations regarding long-term anticoagulation.  Otherwise, she has made significant progress.  She feels generally weak, but she would prefer to go home, although Physical Therapy has recommended Skilled Nursing Facility for the short term.  I discussed this with the patient and her daughter yesterday and this needs further followup.  Otherwise, the patient says she feels weak, no other complaints, and normal bleeding.  PHYSICAL EXAMINATION:  VITAL SIGNS:  Blood pressure 135/87, heart rate 90, temperature 99.1, respirations 20, oxygen saturation 96% on room air. HEAD, EYES, EARS, NOSE, AND THROAT:  Pupils equal and reacting to light. No jugular venous distention.  No carotid bruits. RESPIRATORY:  Bilateral air entry with no rhonchi, rales, or wheezes. CARDIOVASCULAR:  First and second heart sounds heard.  No murmurs. Pulse rate irregular. ABDOMEN:  Scaphoid, soft, nontender.  No palpable organomegaly.  Bowel sounds are normal. CNS:  The patient is alert  and oriented to person, place, and time with no acute focal neurological deficits. EXTREMITIES:  Left leg in dressing.  No pedal edema.  Peripheral pulses equal.  Labs reviewed significant for WBC 6.1, hemoglobin 10.5, hematocrit 32, platelet count 257.  Electrolytes unremarkable.  PT 15.9, INR 1.25.  PLAN: 1. Status post fall with trimalleolar fracture status post ORIF,     management per Orthopedics.  Continue physical therapy. 2. Acute rectal bleeding with acute blood loss anemia, cause unclear.     The  patient to be seen by Gastroenterology today.  She responded to     PRBC transfusion and is off heparin and Coumadin. 3. Atrial fibrillation, now rate controlled on Lopressor, heparin, and     Coumadin on hold, awaiting GI evaluation.  We will place the     patient on low-dose aspirin meanwhile. 4. Depression.  We will resume fluoxetine. 5. Hypothyroidism.  TSH 0.205, which is on the low side.  Synthroid     dose reduced to 100 mcg daily.  This will need further evaluation     in another 4-6 weeks. 6. DVT/GI prophylaxis. 7. Disposition home versus short-term rehab to follow up with family.     Conley Canal, MD     SR/MEDQ  D:  11/27/2010  T:  11/27/2010  Job:  [id number]  Electronically Signed by Conley Canal  on 11/30/2010 05:04:25 PM

## 2010-12-01 NOTE — Discharge Summary (Signed)
NAMEMAEVE, [name]                ACCOUNT NO.:  [id number]  MEDICAL RECORD NO.:  [id number]           PATIENT TYPE:  I  LOCATION:  1519                         [facility]ACILITY:  [facility]  PHYSICIAN:  Andreas Blower, MD       DATE OF BIRTH:  [birth year]  DATE OF ADMISSION:  11/16/2010 DATE OF DISCHARGE:                              DISCHARGE SUMMARY   This is an addendum discharge summary dictation.  Please refer to discharge summary dictated by Dr. Venetia Constable on November 27, 2010 for more details.  DISCHARGE DIAGNOSES: 1. Gastrointestinal bleed most likely due to internal and external     hemorrhoids while on anticoagulation. 2. Left ankle fracture status post open reduction internal fixation of     left ankle trimalleolar fracture. 3. Atrial fibrillation, rate controlled, chronically anticoagulated. 4. History of depression. 5. Hypothyroidism. 6. Osteoporosis. 7. Osteoarthritis. 8. Hard of hearing. 9. History of glaucoma. 10.Acute blood loss anemia due to gastrointestinal bleed requiring     packed red blood cell transfusion. 11.Healthcare-associated pneumonia, completed course of antibiotics. 12.History of the lumbar compression fractures. 13.History of peripheral neuropathy.  DISCHARGE MEDICATIONS: 1. Docusate 100 mg p.o. twice daily. 2. Hydrocortisone suppository 25 mg rectally daily at bedtime as     needed. 3. Metoprolol 25 mg p.o. twice daily. 4. Polyethylene glycol 17 g daily as needed. 5. Senna 1 tablet p.o. twice daily. 6. Oxycodone/acetaminophen 5/325 mg every 6 hours as needed for pain.     The patient given total of 20 tablets. 7. Alendronate 70 mg p.o. every week. 8. Calcium citrate/vitamin D 600/400 units 1 tablet p.o. twice daily. 9. Coumadin to resume her home dose 5 mg on Monday, Tuesday,     Wednesday, Thursday; 1.5 mg on Friday, Saturday and Sunday. 10.Fluoxetine 20 mg p.o. q.a.m. 11.Levothyroxine 112 mcg p.o. q.a.m. 12.Pilocarpine ophthalmic solution 4% one drop in  left eye twice daily 13.Timolol/dorzolamide 0.6%/2.33% one drop left eye twice daily. 14.Travoprost 0.004% one drop in left eye daily at bedtime. 15.Vitamin D2 50,000 units 1 capsule p.o. every week. 16.Vision Formula over-the-counter 1 tablet p.o. q.a.m.  BRIEF ADMITTING HISTORY AND PHYSICAL:  [name] is an 77 year old Caucasian female who was brought into the he ER after the patient had a fall with left ankle pain on April 1 of 2012.  The patient has had a prolonged hospitalization requiring prolonged ventilatory support and for pneumonia, she also went into AFib with RVR and was started on metoprolol.  The patient also had rectal bleeding with hemoglobin drop from 12 to 6.5 which has showed improved with 2 units of PRBC.  RADIOLOGY/IMAGING:  The patient had 2-D echocardiogram on November 22, 2010, which showed left ventricle cavity size was normal.  Borderline LVH. Systolic function was low normal.  Ejection fraction was 50-55%.  Aortic valve showed mildly calcified annulus, trileaflet, showed mildly calcified leaflets, trivial regurgitation.  Mitral valve showed calcified annulus, mild regurgitation.  Left atrium was moderately dilated.  Right ventricle cavity size was normal.  Wall thickness was mildly increased.  Right atrium was mildly dilated.  Atrial septum shows no defect or patent foramen ovale.  The patient has had multiple chest x-rays most recently on November 25, 2010, which showed little change to slightly improved aeration. Endotracheal tube was removed.  The patient had x-ray of the left ankle on November 16, 2010, which showed unstable trimalleolar fracture/dislocation of the ankle.  LABORATORY DATA:  CBC shows a white count of 6.6, hemoglobin 12.3, hematocrit 36.6, platelet count 320, INR is 1.34.  Electrolytes normal with a creatinine of 0.78.  HOSPITAL COURSE BY PROBLEM: 1. Status post fall with trimalleolar fracture status post surgery by     Dr. Victorino Dike.  The  patient is to continue Physical Therapy.  After     initial evaluation by Physical Therapy, they recommended skilled     nursing facility, however, the patient declined.  As a result, home     health will be arranged for her at the time of discharge. 2. AFib.  During the course of hospital stay, the patient went into     rapid ventricular rate.  She was started on metoprolol with good     heart rate control.  Initially was on heparin.  However, this was     discontinued due to rectal bleed.  The patient at the time of     discharge was resumed on Coumadin.  The patient to follow up with     [facility] as an outpatient.  [facility] Dr.     Patty Sermons evaluated the patient during the course of hospital stay.     A 2-D echocardiogram was obtained with results as above.  Further     management as an outpatient.  At the time of discharge, the     patient's heart rate was well controlled at 64. 3. Acute GI bleed likely from hemorrhoidal source from internal and     external.  The patient was evaluated by GI for her acute blood     loss anemia, received 2 units of PRBC, with appropriate response.     The patient had a colonoscopy and had multiple polyps removed.      Found no other source of bleeding.  The patient had to be called     with biopsy results of the polyps as outpatient. 4. Depression.  Continue the patient of fluoxetine. 5. Hypothyroidism.  TSH was low.  Her Synthroid dose initially had     been reduced to 100 mcg p.o. daily; however, at the time of     discharge, the patient will be resumed on home dose because we are     uncertain when thyroid dose was changed.  We will defer to her     primary care physician for further management and followup. 6. Pneumonia and ventilatory dependent respiratory failure.  During     the course of hospital stay, the patient was difficult to wean off     ventilation, however, was weaned successfully.  She was started on      moxifloxacin for healthcare associated pneumonia.  The patient     completed course of antibiotics for her pneumonia during the course     of her hospital stay.  DISPOSITION AND FOLLOWUP:  The patient to follow up with Dr. Aida Puffer, her primary care physician, in 1 week.  The patient to follow with Dr. Victorino Dike with Ortho in 10 days.  The patient to follow up with [facility] in 2 to 3 weeks.  The patient to follow up with [facility] and they will call the patient with the results  of the biopsy.  The patient to also have a PT/INR checked on December 01, 2010, or December 02, 2010, and have her dose adjusted thereafter.  [facility] agency has been arranged for her at the time of discharge.  Time spent on discharge, talking to the patient, talking to the daughter, and coordinating care was 40 minutes.   Andreas Blower, MD   SR/MEDQ  D:  11/29/2010  T:  11/29/2010  Job:  [id number]  Electronically Signed by Wardell Heath Yasiel Goyne  on 11/30/2010 10:55:08 AM

## 2010-12-05 ENCOUNTER — Telehealth: Payer: Self-pay | Admitting: Gastroenterology

## 2010-12-05 NOTE — Consult Note (Signed)
[name], [name]                ACCOUNT NO.:  [id number]  MEDICAL RECORD NO.:  [id number]           PATIENT TYPE:  I  LOCATION:  1440                         FACILITY:  [facility]  PHYSICIAN:  Toni Arthurs, MD        DATE OF BIRTH:  17-Jan-1927  DATE OF CONSULTATION:  11/17/2010 DATE OF DISCHARGE:                                CONSULTATION   PRIMARY CARE PROVIDER:  Aida Puffer, MD, in [place], [place].  REASON FOR CONSULTATION:  Left ankle injury.  HISTORY OF PRESENT ILLNESS:  The patient is an 77 year old female who fell at home yesterday.  She was brought to the emergency department. She has a history of atrial fibrillation and takes Coumadin.  She also has a history of right tibial pilon and fibular fractures.  She was treated by Dr. Priscille Kluver several years ago for this injury.  She is healed up from this and was bearing weight as tolerated with a walker prior to her injury.  At this time, she complains of left ankle pain, it is dull and aching, it is worse with keeping it down and better with pain medicine and elevation.  She denies numbness, tingling, or weakness in her foot.  She is not diabetic.  She is not a smoker.  She does have a history of psoriasis.  Her pain is moderate in intensity.  PAST MEDICAL HISTORY: 1. Atrial fibrillation, on chronic Coumadin. 2. Hypothyroidism. 3. Glaucoma. 4. Peripheral neuropathy. 5. Osteoporosis. 6. Hard of hearing. 7. Status post ORIF, right distal tibia and fibula fractures.  PAST SURGICAL HISTORY:  Hysterectomy and ORIF, as above.  MEDICATIONS:  See admission med rec sheet.  SOCIAL HISTORY:  The patient lives alone.  She does not use any tobacco products.  FAMILY HISTORY:  Negative for hypertension and diabetes.  REVIEW OF SYSTEMS:  No recent fever, chills, nausea, vomiting, or changes in her appetite, frequent falls.  Review of systems as above and otherwise negative.  PHYSICAL EXAMINATION:  GENERAL:  The patient is an  elderly woman, in no apparent stress, alert and oriented x4.  Mood and affect normal. HEENT:  Extraocular motions are intact. LUNGS:  Respirations are unlabored. NECK:  No lymphadenopathy is noted. EXTREMITIES:  Her left lower extremity is immobilized in a splint.  The skin is otherwise healthy and intact.  Sensibility to light touch is intact in the toes. She has brisk capillary refill in the toes with 5/5 strength in plantar flexion and dorsiflexion.  X-RAYS:  Plain films from the ER of her left ankle show a displaced trimalleolar ankle fracture.  ASSESSMENT: 1. Left displaced ankle trimalleolar fracture. 2. Atrial fibrillation with Coumadin chronically.  PLAN:  I explained to the patient and her daughter in detail the nature of this injury.  She needs an open reduction and internal fixation of this unstable displaced fracture.  She will stop her Coumadin.  She will keep her foot elevated.  We are going to schedule for surgery at the end of the week once her INR has normalized.  In the meantime, she will have SCDs and keep her foot elevated.  She and her daughter understand this plan and agree.     Toni Arthurs, MD     JH/MEDQ  D:  11/17/2010  T:  11/18/2010  Job:  [id number]  cc:   Dr. Benn Moulder, [place]  Electronically Signed by Toni Arthurs  on 12/05/2010 10:31:34 PM

## 2010-12-05 NOTE — Op Note (Signed)
[name], [name]                ACCOUNT NO.:  [id number]  MEDICAL RECORD NO.:  [id number]           PATIENT TYPE:  I  LOCATION:  1440                         FACILITY:  [facility]  PHYSICIAN:  Toni Arthurs, MD        DATE OF BIRTH:  [birth year]  DATE OF PROCEDURE:  11/21/2010 DATE OF DISCHARGE:                              OPERATIVE REPORT   PREOPERATIVE DIAGNOSIS:  Left ankle trimalleolar fracture.  POSTOPERATIVE DIAGNOSIS:  Left ankle trimalleolar fracture.  PROCEDURE: 1. Open reduction and internal fixation of left ankle trimalleolar     fracture. 2. Intraoperative stress examination under fluoroscopy. 3. Intraoperative interpretation of fluoroscopic images greater than 1     hour.  SURGEON:  Toni Arthurs, MD  ANESTHESIA:  General.  IV FLUIDS:  See Anesthesia record.  ESTIMATED BLOOD LOSS:  Minimal.  TOURNIQUET TIME:  1 hour and 5 minutes at 250 mmHg.  COMPLICATIONS:  None apparent.  DISPOSITION:  Extubated, awakened, stable to recovery.  INDICATIONS FOR PROCEDURE:  The patient is an 77 year old female who slipped and fell at home injuring her left ankle approximately 5 days ago.  She sustained a trimalleolar ankle fracture.  She was admitted to the hospital due to chronic Coumadin therapy for atrial fibrillation. She presents now for operative treatment now that her INR has normalized.  She understands the risks and benefits of this treatment including bleeding, infection, nerve damage, blood clots, need for additional surgery, amputation, and death.  She understands the risks and alternative treatment options as well, she would like to proceed.  PROCEDURE IN DETAIL:  After preoperative consent was obtained and the correct operative site was identified, the patient was brought to the operating room and placed supine on the operating table.  General anesthesia was induced.  Preoperative antibiotics were administered. Surgical time-out was taken.  The left lower  extremity was prepped and draped in standard sterile fashion with tourniquet around the thigh. Longitudinal incision were marked over the medial lateral malleoli. Extremity was exsanguinated and tourniquet was inflated to 250 mmHg.  A lateral incision was made.  Sharp dissection was carried down through the skin.  Blunt dissection was carried down through the subcutaneous tissue to the fracture site.  Fracture was noted to be comminuted into many small fragments.  The anterior cortex was still intact so this was used as a reference for reestablishing the length of the fibula.  An anatomic fibular plate was selected from the DePuy system.  It was applied to the lateral fibula and pinned into position.  It was pinned proximally and the fracture site was clamped in the central portion to the plate.  AP and lateral fluoroscopic views were obtained showing appropriate reduction of the fracture and appropriate position of the plate.  The distal end of the plate was drilled and filled with approximately 5 unicortical locking screws and 1 bicortical nonlocking screw to pull the plate securely down to the bone.  The proximal end of the plate was secured with 3 bicortical nonlocking screws and a single locking screw.  The AP and lateral x-rays again showed anatomic reduction of  the fibular fracture and appropriate position and length of the hardware.  Attention was then turned to the medial malleolus. Curvilinear incision was marked over the tip of the medial malleolus. Blunt dissection was carried down through the skin.  The fracture site was identified and cleared of all periosteum intervening.  A tenaculum was used to reduce and clamp the fractured medial malleolus.  Two K- wires were inserted through the tip of the medial malleolus up into metaphyseal bone of the tibia.  AP and lateral views confirmed appropriate position of both pins.  Two 4.0 partially-threaded cannulated screws were then  inserted over the guidewires and were noted to have excellent purchase.  The guide pins were removed and mortise view was then obtained showing appropriate reduction of the medial malleolus fracture and appropriate position and length of both screws. A dorsiflexion and external rotation stress was then applied under live fluoroscopic imaging.  The stress views showed no evidence of widening at either the medial clear space or the syndesmosis.  Both wounds were then irrigated copiously.  Subcutaneous tissue was closed with inverted simple sutures of 3-0 Monocryl, skin was closed with running 3-0 Prolene sutures.  Sterile dressings were applied followed by a well-padded short leg splint.  Tourniquet was released at an hour and 5 minutes after application of the dressings.  The patient was awakened by Anesthesia and transported to the recovery room in stable condition.  FOLLOWUP PLAN:  The patient will be nonweightbearing on the left lower extremity.  She will resume her Coumadin therapy and followup with me in approximately 2 weeks.     Toni Arthurs, MD     JH/MEDQ  D:  11/21/2010  T:  11/22/2010  Job:  [id number]  Electronically Signed by Toni Arthurs  on 12/05/2010 10:31:37 PM

## 2010-12-05 NOTE — Telephone Encounter (Signed)
Left message for patient to call back  

## 2010-12-05 NOTE — Telephone Encounter (Signed)
Granddaughter notified of the results.

## 2010-12-10 ENCOUNTER — Encounter: Payer: Self-pay | Admitting: Gastroenterology

## 2010-12-26 ENCOUNTER — Encounter: Payer: Self-pay | Admitting: Nurse Practitioner

## 2010-12-26 DIAGNOSIS — M810 Age-related osteoporosis without current pathological fracture: Secondary | ICD-10-CM | POA: Insufficient documentation

## 2010-12-26 DIAGNOSIS — H409 Unspecified glaucoma: Secondary | ICD-10-CM | POA: Insufficient documentation

## 2010-12-26 DIAGNOSIS — E039 Hypothyroidism, unspecified: Secondary | ICD-10-CM | POA: Insufficient documentation

## 2010-12-26 DIAGNOSIS — G629 Polyneuropathy, unspecified: Secondary | ICD-10-CM | POA: Insufficient documentation

## 2010-12-26 DIAGNOSIS — I4891 Unspecified atrial fibrillation: Secondary | ICD-10-CM | POA: Insufficient documentation

## 2010-12-26 DIAGNOSIS — W19XXXA Unspecified fall, initial encounter: Secondary | ICD-10-CM | POA: Insufficient documentation

## 2010-12-26 DIAGNOSIS — M1990 Unspecified osteoarthritis, unspecified site: Secondary | ICD-10-CM | POA: Insufficient documentation

## 2010-12-26 DIAGNOSIS — S82853A Displaced trimalleolar fracture of unspecified lower leg, initial encounter for closed fracture: Secondary | ICD-10-CM | POA: Insufficient documentation

## 2010-12-29 ENCOUNTER — Other Ambulatory Visit: Payer: Self-pay | Admitting: Nurse Practitioner

## 2010-12-29 ENCOUNTER — Ambulatory Visit: Payer: Medicare Other | Admitting: Nurse Practitioner

## 2010-12-29 MED ORDER — METOPROLOL TARTRATE 25 MG PO TABS: 25.0000 mg | ORAL_TABLET | Freq: Two times a day (BID) | ORAL | Status: DC

## 2010-12-29 NOTE — Telephone Encounter (Signed)
Cancelled app today due to weather and having broken leg. Wanted Metoprolol called in. Rescheduled app.

## 2010-12-29 NOTE — Telephone Encounter (Signed)
Patient cancelled for today due to weather and her having a broken leg. She is Out of her meds for metoprolol 25mg . Which was given to her in hospital. We  Rescheduled the appointment for  Wed. 5/16. Is there anyway she can get some called into [facility] on [address]  [phone]

## 2010-12-30 NOTE — Op Note (Signed)
NAMEMARILUZ, [name]                ACCOUNT NO.:  [id number]   MEDICAL RECORD NO.:  [id number]          PATIENT TYPE:  INP   LOCATION:  0006                         FACILITY:  [facility]   PHYSICIAN:  John L. Rendall, M.D.  DATE OF BIRTH:  23-Nov-1926   DATE OF PROCEDURE:  02/09/2008  DATE OF DISCHARGE:                               OPERATIVE REPORT   PREOPERATIVE DIAGNOSIS:  Displaced bimalleolar ankle fracture right.   POSTOPERATIVE DIAGNOSIS:  Displaced bimalleolar ankle fracture right.   SURGICAL PROCEDURE:  Open treatment of bimalleolar ankle fracture with  two screws medially and 10 hole plate and screws laterally.   SURGEON:  John L. Rendall, M.D.   ASSISTANT:  Legrand Pitts. Duffy, PAC   ANESTHESIA:  General.   PATHOLOGY:  The patient has an abduction external rotation type of  fracture with an unstable medial malleolus that is obliquely angled  proximally from the corner of the plafond and laterally.  There is  external rotation fracture beginning obliquely at the level of the  plafond extending superiorly and laterally approximately 2-1/2 inches in  length.  This is an intrinsically unstable fracture and her bone is very  soft.  The syndesmosis is most certainly disrupted.   PROCEDURE IN DETAIL:  Under general anesthesia the right foot and ankle  was prepared with DuraPrep and draped as a sterile field.  The medial  malleolar incision is made first and the medial malleolus is reduced  with two small K-wires for Ace fracture small fragment fracture set and  two cancellous lag screws are used.  The first one gave relatively  little bite in her cancellous bone and the second screw was actually  placed through the cortex on purpose in the region of the tib-fib  syndesmosis to grab the cortex to maintain better grip.  With these two  in place the medial malleolus was virtually anatomically reduced.  Attention was turned to the lateral malleolus.  An approximate 6 inch  incision is  made.  The fracture is seen to be reduced and a 10 hole  plate was applied and locking screws were used on the distal five holes  and standard screws on the proximal four.  One screw hole was left empty  as it crosses a very obvious portion of the fracture.  With the plate in  place the tib-fib syndesmosis was reduced and the talus was anatomic in  the mortise.  Two screws nonlocking are started in the diaphysis  proximally and then the locking screws were placed distally and then  additional standard screws at the proximal end of the plate.  This is a  new low-profile baseplate and fit the bone very nicely.  Following this  the wound was irrigated after C-arm pictures documented rib position and  alignment.  The periosteal tissue is closed over the plate, subcu with 2-  0 Vicryl and skin with clips.  Operative time skin to skin an hour and  nine minutes.      John L. Rendall, M.D.  Electronically Signed     JLR/MEDQ  D:  02/09/2008  T:  02/09/2008  Job:  [id number]

## 2010-12-31 ENCOUNTER — Ambulatory Visit: Payer: Medicare Other | Admitting: Nurse Practitioner

## 2011-01-02 NOTE — Discharge Summary (Signed)
[name], [name]                ACCOUNT NO.:  [id number]   MEDICAL RECORD NO.:  [id number]          PATIENT TYPE:  INP   LOCATION:  1617                         FACILITY:  [facility]   PHYSICIAN:  John L. Rendall, M.D.  DATE OF BIRTH:  April 20, 1927   DATE OF ADMISSION:  02/09/2008  DATE OF DISCHARGE:  02/11/2008                               DISCHARGE SUMMARY   ADMISSION DIAGNOSES:  1. Right bimalleolar ankle fracture.  2. Peripheral neuropathy.  3. Lumbar compression fractures.  4. Glaucoma.  5. Hypothyroidism.  6. History of atrial fibrillation.  7. Osteoporosis.   DISCHARGE DIAGNOSES:  1. Right displaced bimalleolar ankle fracture status post open      reduction with internal fixation.  2. Acute blood loss anemia secondary to surgery.  3. Urinary retention, now resolved.  4. Peripheral neuropathy.  5. Lumbar compression fractures.  6. Glaucoma.  7. Hypothyroidism.  8. Atrial fibrillation.  9. Osteoporosis.   SURGICAL PROCEDURES:  On February 09, 2008, [name] underwent an open  reduction with internal fixation of a displaced right bimalleolar ankle  fracture by Dr. Jonny Ruiz L.Rendall assisted by Arnoldo Morale, PA-C.   COMPLICATIONS:  None.   CONSULTANT:  1. Pharmacy consult for Coumadin therapy February 09, 2008, in addition to      a case management consult.  2. Physical therapy consult February 10, 2008, and occupational therapy      consult also on that day.   HISTORY OF PRESENT ILLNESS:  A 77 year old white female patient  presented to our office with right ankle pain after a fall.  She reports  she got up from a chair on June 23 and her foot inverted and she fell.  She continued to complain of pain and when she was seen in the office  she was found to have a right bimalleolar ankle fracture.  She was  placed in a splint and is admitted now for surgical fixation of  fracture.   HOSPITAL COURSE:  [name] tolerated her surgical procedure well  without immediate postoperative  complications.  She was noted to have  very soft bone during the surgical procedure.  She was transferred to  the orthopedic floor.  On postop day #1 she was complaining of the  splint being too tight.  It was loosened and did relieve her pain.  She  did require I&O cath twice during the night and that was monitored.  She  was afebrile with vitals stable.  Hemoglobin was 10.3 and hematocrit 30.  The leg was neurovascularly intact.  She was weaned off her PCA an  oxygen and started on Arixtra until her Coumadin was therapeutic.   Postop day #2 she was doing well.  She was voiding without difficulty.  Pain was well controlled.  She was doing well with therapy.  Hemoglobin  10.4, hematocrit 29.9.  Leg was neurovascularly intact.  It was felt she  was ready for discharge home and was discharged home with her family at  that time.   DISCHARGE DIET:  She can resume her regular prehospitalization diet.   MEDICATIONS:  She  may resume her home meds as follows:  1. Levothyroxine 12 mcg p.o. q.a.m..  2. Simvastatin 10 mg p.o. q.p.m.Marland Kitchen  3. Coumadin:  She is to have no Coumadin today and then 2.5 mg the day      after discharge and then resume her home dosing regimen which is 5      mg 1/2 tablet every Monday and Thursday and then 1 tablet the rest      of the week.  4. Multivitamin 1 tablet p.o. q.a.m..  5. Pilocarpine 4% ophthalmic solution 1 drop left eye q.i.d.  6. Atenolol 0.5 ophthalmic solution 1 drop left eye q.a.m.Marland Kitchen  7. Travatan Z 0.004% 1 drop left eye q.p.m.  8. Artificial tears p.r.n..  9. Fosamax 70 mg p.o. q. week.  10.Calcium plus vitamin D 1 tablet p.o. q.a.m.  11.Percocet 1 tablet p.o. q.i.d. p.r.n. pain.  12.Flexeril 10 mg p.o. q.8 h. p.r.n. for spasms.  13.Prozac 10 mg p.o. q.a.m.Marland Kitchen  14.Vitamin B12 shots every month.   Additional meds at this time include:  Percocet 5/325, 1-2 p.o. q.4 h p.r.n. for pain, 60 with no refills.  She  will not use her other Percocet while on  that.   ACTIVITY:  She can increase activity slowly.  Nonweightbearing on that  right leg with use of the walker.  No lifting or driving for six weeks.  Keep the splint clean and dry.   WOUND CARE:  The splint needs to be kept clean and dry.  Elevate the  right leg on pillows and above the heart as much as possible.  Notify  Dr. Priscille Kluver if temperature greater than 101.5, chills, pain unrelieved  by pain meds, or foul-smelling drainage from the wound.   FOLLOWUP:  She needs to follow up with Dr. Priscille Kluver in our office on  Thursday, July 2 and needs to call [phone] for that appointment.   She is arranged for home health PT, OT and RN per [facility].   LABORATORY DATA:  Hemoglobin/hematocrit ranged from 12.4 and 37 on the  25th to 10.4 and 29.9 on the 27th.  Platelet and white count were within  normal limits.   PT and INR were 17.6 and 1.4 with a PTT of 40 on the 25th and then went  to 28.7 and 2.6 on the 27th.   Potassium was 5.4 but hemolyzed on the 25th and then was 4.3 on the 26.  Sodium dropped to a low of 133 on the 26th.  Glucose was high at 103 on  the 25th.   Calcium dropped to a low of 8 on the 26th.  Total bilirubin was high at  2.4 on the 25th.  All other laboratory studies were within normal  limits.      Legrand Pitts Duffy, P.A.      John L. Rendall, M.D.  Electronically Signed    KED/MEDQ  D:  02/29/2008  T:  02/29/2008  Job:  [id number]

## 2011-05-14 LAB — CBC
HCT: 29.9 — ABNORMAL LOW
HCT: 30 — ABNORMAL LOW
Hemoglobin: 10.3 — ABNORMAL LOW
Hemoglobin: 12.4
MCHC: 34.3
MCHC: 34.8
MCV: 90.9
MCV: 91.4
Platelets: 198
Platelets: 255
Platelets: 286
RDW: 13.5
RDW: 13.6
RDW: 13.9
WBC: 8.7

## 2011-05-14 LAB — BASIC METABOLIC PANEL
BUN: 8
CO2: 27
Chloride: 102
GFR calc non Af Amer: >60
Glucose, Bld: 91
Potassium: 4.3
Sodium: 133 — ABNORMAL LOW

## 2011-05-14 LAB — COMPREHENSIVE METABOLIC PANEL
ALT: 12
AST: 30
Albumin: 3.5
Alkaline Phosphatase: 81
Chloride: 105
Potassium: 5.4 — ABNORMAL HIGH
Sodium: 140
Total Bilirubin: 2.4 — ABNORMAL HIGH
Total Protein: 6.9

## 2011-05-14 LAB — PROTIME-INR: Prothrombin Time: 28.7 — ABNORMAL HIGH

## 2011-08-20 DIAGNOSIS — I720 Aneurysm of carotid artery: Secondary | ICD-10-CM | POA: Diagnosis not present

## 2011-08-20 DIAGNOSIS — G9009 Other idiopathic peripheral autonomic neuropathy: Secondary | ICD-10-CM | POA: Diagnosis not present

## 2011-08-20 DIAGNOSIS — I4891 Unspecified atrial fibrillation: Secondary | ICD-10-CM | POA: Diagnosis not present

## 2011-08-20 DIAGNOSIS — M810 Age-related osteoporosis without current pathological fracture: Secondary | ICD-10-CM | POA: Diagnosis not present

## 2011-08-20 DIAGNOSIS — D518 Other vitamin B12 deficiency anemias: Secondary | ICD-10-CM | POA: Diagnosis not present

## 2011-09-30 DIAGNOSIS — E782 Mixed hyperlipidemia: Secondary | ICD-10-CM | POA: Diagnosis not present

## 2011-09-30 DIAGNOSIS — I4891 Unspecified atrial fibrillation: Secondary | ICD-10-CM | POA: Diagnosis not present

## 2011-09-30 DIAGNOSIS — D518 Other vitamin B12 deficiency anemias: Secondary | ICD-10-CM | POA: Diagnosis not present

## 2011-09-30 DIAGNOSIS — G894 Chronic pain syndrome: Secondary | ICD-10-CM | POA: Diagnosis not present

## 2011-10-09 DIAGNOSIS — H4011X0 Primary open-angle glaucoma, stage unspecified: Secondary | ICD-10-CM | POA: Diagnosis not present

## 2011-10-09 DIAGNOSIS — H409 Unspecified glaucoma: Secondary | ICD-10-CM | POA: Diagnosis not present

## 2011-10-09 DIAGNOSIS — H2510 Age-related nuclear cataract, unspecified eye: Secondary | ICD-10-CM | POA: Diagnosis not present

## 2011-11-02 DIAGNOSIS — G609 Hereditary and idiopathic neuropathy, unspecified: Secondary | ICD-10-CM | POA: Diagnosis not present

## 2011-11-02 DIAGNOSIS — Z7901 Long term (current) use of anticoagulants: Secondary | ICD-10-CM | POA: Diagnosis not present

## 2011-11-02 DIAGNOSIS — M8448XA Pathological fracture, other site, initial encounter for fracture: Secondary | ICD-10-CM | POA: Diagnosis not present

## 2011-11-02 DIAGNOSIS — H269 Unspecified cataract: Secondary | ICD-10-CM | POA: Diagnosis not present

## 2011-11-02 DIAGNOSIS — Z5181 Encounter for therapeutic drug level monitoring: Secondary | ICD-10-CM | POA: Diagnosis not present

## 2011-11-02 DIAGNOSIS — H2510 Age-related nuclear cataract, unspecified eye: Secondary | ICD-10-CM | POA: Diagnosis not present

## 2011-11-17 DIAGNOSIS — D518 Other vitamin B12 deficiency anemias: Secondary | ICD-10-CM | POA: Diagnosis not present

## 2011-11-17 DIAGNOSIS — I720 Aneurysm of carotid artery: Secondary | ICD-10-CM | POA: Diagnosis not present

## 2011-11-17 DIAGNOSIS — G9009 Other idiopathic peripheral autonomic neuropathy: Secondary | ICD-10-CM | POA: Diagnosis not present

## 2011-11-17 DIAGNOSIS — F17200 Nicotine dependence, unspecified, uncomplicated: Secondary | ICD-10-CM | POA: Diagnosis not present

## 2011-11-26 DIAGNOSIS — H4011X0 Primary open-angle glaucoma, stage unspecified: Secondary | ICD-10-CM | POA: Diagnosis not present

## 2011-11-26 DIAGNOSIS — M8448XA Pathological fracture, other site, initial encounter for fracture: Secondary | ICD-10-CM | POA: Diagnosis not present

## 2011-11-26 DIAGNOSIS — Z7901 Long term (current) use of anticoagulants: Secondary | ICD-10-CM | POA: Diagnosis not present

## 2011-11-26 DIAGNOSIS — Z961 Presence of intraocular lens: Secondary | ICD-10-CM | POA: Diagnosis not present

## 2011-11-26 DIAGNOSIS — G609 Hereditary and idiopathic neuropathy, unspecified: Secondary | ICD-10-CM | POA: Diagnosis not present

## 2011-11-26 DIAGNOSIS — E039 Hypothyroidism, unspecified: Secondary | ICD-10-CM | POA: Diagnosis not present

## 2011-11-26 DIAGNOSIS — H2589 Other age-related cataract: Secondary | ICD-10-CM | POA: Diagnosis not present

## 2011-11-26 DIAGNOSIS — H409 Unspecified glaucoma: Secondary | ICD-10-CM | POA: Diagnosis not present

## 2011-11-26 DIAGNOSIS — M810 Age-related osteoporosis without current pathological fracture: Secondary | ICD-10-CM | POA: Diagnosis not present

## 2011-11-26 DIAGNOSIS — I10 Essential (primary) hypertension: Secondary | ICD-10-CM | POA: Diagnosis not present

## 2011-11-26 DIAGNOSIS — I4891 Unspecified atrial fibrillation: Secondary | ICD-10-CM | POA: Diagnosis not present

## 2011-12-28 DIAGNOSIS — I4891 Unspecified atrial fibrillation: Secondary | ICD-10-CM | POA: Diagnosis not present

## 2011-12-28 DIAGNOSIS — D518 Other vitamin B12 deficiency anemias: Secondary | ICD-10-CM | POA: Diagnosis not present

## 2011-12-28 DIAGNOSIS — G894 Chronic pain syndrome: Secondary | ICD-10-CM | POA: Diagnosis not present

## 2012-01-12 DIAGNOSIS — M25579 Pain in unspecified ankle and joints of unspecified foot: Secondary | ICD-10-CM | POA: Diagnosis not present

## 2012-02-01 DIAGNOSIS — D518 Other vitamin B12 deficiency anemias: Secondary | ICD-10-CM | POA: Diagnosis not present

## 2012-02-01 DIAGNOSIS — Z0189 Encounter for other specified special examinations: Secondary | ICD-10-CM | POA: Diagnosis not present

## 2012-02-01 DIAGNOSIS — E782 Mixed hyperlipidemia: Secondary | ICD-10-CM | POA: Diagnosis not present

## 2012-02-01 DIAGNOSIS — E039 Hypothyroidism, unspecified: Secondary | ICD-10-CM | POA: Diagnosis not present

## 2012-02-01 DIAGNOSIS — G894 Chronic pain syndrome: Secondary | ICD-10-CM | POA: Diagnosis not present

## 2012-02-01 DIAGNOSIS — I4891 Unspecified atrial fibrillation: Secondary | ICD-10-CM | POA: Diagnosis not present

## 2012-02-17 DIAGNOSIS — H903 Sensorineural hearing loss, bilateral: Secondary | ICD-10-CM | POA: Diagnosis not present

## 2012-02-17 DIAGNOSIS — H93299 Other abnormal auditory perceptions, unspecified ear: Secondary | ICD-10-CM | POA: Diagnosis not present

## 2012-02-22 DIAGNOSIS — H4011X0 Primary open-angle glaucoma, stage unspecified: Secondary | ICD-10-CM | POA: Diagnosis not present

## 2012-02-22 DIAGNOSIS — H409 Unspecified glaucoma: Secondary | ICD-10-CM | POA: Diagnosis not present

## 2012-02-22 DIAGNOSIS — H2510 Age-related nuclear cataract, unspecified eye: Secondary | ICD-10-CM | POA: Diagnosis not present

## 2012-03-01 DIAGNOSIS — H905 Unspecified sensorineural hearing loss: Secondary | ICD-10-CM | POA: Diagnosis not present

## 2012-03-08 DIAGNOSIS — Z79899 Other long term (current) drug therapy: Secondary | ICD-10-CM | POA: Diagnosis not present

## 2012-03-08 DIAGNOSIS — D518 Other vitamin B12 deficiency anemias: Secondary | ICD-10-CM | POA: Diagnosis not present

## 2012-03-08 DIAGNOSIS — I4891 Unspecified atrial fibrillation: Secondary | ICD-10-CM | POA: Diagnosis not present

## 2012-03-08 DIAGNOSIS — E782 Mixed hyperlipidemia: Secondary | ICD-10-CM | POA: Diagnosis not present

## 2012-03-08 DIAGNOSIS — Z5181 Encounter for therapeutic drug level monitoring: Secondary | ICD-10-CM | POA: Diagnosis not present

## 2012-03-14 DIAGNOSIS — R5383 Other fatigue: Secondary | ICD-10-CM | POA: Diagnosis not present

## 2012-03-14 DIAGNOSIS — Z79899 Other long term (current) drug therapy: Secondary | ICD-10-CM | POA: Diagnosis not present

## 2012-03-14 DIAGNOSIS — I4891 Unspecified atrial fibrillation: Secondary | ICD-10-CM | POA: Diagnosis not present

## 2012-03-14 DIAGNOSIS — R5381 Other malaise: Secondary | ICD-10-CM | POA: Diagnosis not present

## 2012-03-14 DIAGNOSIS — I259 Chronic ischemic heart disease, unspecified: Secondary | ICD-10-CM | POA: Diagnosis not present

## 2012-03-14 DIAGNOSIS — G894 Chronic pain syndrome: Secondary | ICD-10-CM | POA: Diagnosis not present

## 2012-03-14 DIAGNOSIS — Z5181 Encounter for therapeutic drug level monitoring: Secondary | ICD-10-CM | POA: Diagnosis not present

## 2012-04-01 ENCOUNTER — Ambulatory Visit
Admission: RE | Admit: 2012-04-01 | Discharge: 2012-04-01 | Disposition: A | Discharge status: Home / Self Care | Payer: Medicare Other | Source: Ambulatory Visit | Attending: Cardiology | Admitting: Cardiology

## 2012-04-01 ENCOUNTER — Other Ambulatory Visit: Payer: Self-pay | Admitting: Cardiology

## 2012-04-01 DIAGNOSIS — M8448XA Pathological fracture, other site, initial encounter for fracture: Secondary | ICD-10-CM | POA: Diagnosis not present

## 2012-04-01 DIAGNOSIS — R0602 Shortness of breath: Secondary | ICD-10-CM | POA: Diagnosis not present

## 2012-04-01 DIAGNOSIS — R0989 Other specified symptoms and signs involving the circulatory and respiratory systems: Secondary | ICD-10-CM | POA: Diagnosis not present

## 2012-04-01 DIAGNOSIS — E781 Pure hyperglyceridemia: Secondary | ICD-10-CM | POA: Diagnosis not present

## 2012-04-01 DIAGNOSIS — R0609 Other forms of dyspnea: Secondary | ICD-10-CM | POA: Diagnosis not present

## 2012-04-01 DIAGNOSIS — K449 Diaphragmatic hernia without obstruction or gangrene: Secondary | ICD-10-CM | POA: Diagnosis not present

## 2012-04-01 DIAGNOSIS — Z7901 Long term (current) use of anticoagulants: Secondary | ICD-10-CM | POA: Diagnosis not present

## 2012-04-01 DIAGNOSIS — I4891 Unspecified atrial fibrillation: Secondary | ICD-10-CM | POA: Diagnosis not present

## 2012-04-06 DIAGNOSIS — Z79899 Other long term (current) drug therapy: Secondary | ICD-10-CM | POA: Diagnosis not present

## 2012-04-06 DIAGNOSIS — I4891 Unspecified atrial fibrillation: Secondary | ICD-10-CM | POA: Diagnosis not present

## 2012-04-06 DIAGNOSIS — F17200 Nicotine dependence, unspecified, uncomplicated: Secondary | ICD-10-CM | POA: Diagnosis not present

## 2012-04-06 DIAGNOSIS — G9009 Other idiopathic peripheral autonomic neuropathy: Secondary | ICD-10-CM | POA: Diagnosis not present

## 2012-04-06 DIAGNOSIS — G894 Chronic pain syndrome: Secondary | ICD-10-CM | POA: Diagnosis not present

## 2012-04-06 DIAGNOSIS — Z5181 Encounter for therapeutic drug level monitoring: Secondary | ICD-10-CM | POA: Diagnosis not present

## 2012-04-06 DIAGNOSIS — D518 Other vitamin B12 deficiency anemias: Secondary | ICD-10-CM | POA: Diagnosis not present

## 2012-04-06 DIAGNOSIS — M810 Age-related osteoporosis without current pathological fracture: Secondary | ICD-10-CM | POA: Diagnosis not present

## 2012-04-21 DIAGNOSIS — R0602 Shortness of breath: Secondary | ICD-10-CM | POA: Diagnosis not present

## 2012-04-21 DIAGNOSIS — I4891 Unspecified atrial fibrillation: Secondary | ICD-10-CM | POA: Diagnosis not present

## 2012-05-06 DIAGNOSIS — I4891 Unspecified atrial fibrillation: Secondary | ICD-10-CM | POA: Diagnosis not present

## 2012-05-06 DIAGNOSIS — R0602 Shortness of breath: Secondary | ICD-10-CM | POA: Diagnosis not present

## 2012-05-11 DIAGNOSIS — Z5181 Encounter for therapeutic drug level monitoring: Secondary | ICD-10-CM | POA: Diagnosis not present

## 2012-05-11 DIAGNOSIS — G894 Chronic pain syndrome: Secondary | ICD-10-CM | POA: Diagnosis not present

## 2012-05-11 DIAGNOSIS — F17200 Nicotine dependence, unspecified, uncomplicated: Secondary | ICD-10-CM | POA: Diagnosis not present

## 2012-05-11 DIAGNOSIS — D518 Other vitamin B12 deficiency anemias: Secondary | ICD-10-CM | POA: Diagnosis not present

## 2012-05-11 DIAGNOSIS — E039 Hypothyroidism, unspecified: Secondary | ICD-10-CM | POA: Diagnosis not present

## 2012-05-11 DIAGNOSIS — I4891 Unspecified atrial fibrillation: Secondary | ICD-10-CM | POA: Diagnosis not present

## 2012-05-11 DIAGNOSIS — Z79899 Other long term (current) drug therapy: Secondary | ICD-10-CM | POA: Diagnosis not present

## 2012-05-12 DIAGNOSIS — R0609 Other forms of dyspnea: Secondary | ICD-10-CM | POA: Diagnosis not present

## 2012-05-12 DIAGNOSIS — E781 Pure hyperglyceridemia: Secondary | ICD-10-CM | POA: Diagnosis not present

## 2012-05-12 DIAGNOSIS — Z7901 Long term (current) use of anticoagulants: Secondary | ICD-10-CM | POA: Diagnosis not present

## 2012-05-12 DIAGNOSIS — R0989 Other specified symptoms and signs involving the circulatory and respiratory systems: Secondary | ICD-10-CM | POA: Diagnosis not present

## 2012-05-12 DIAGNOSIS — I4891 Unspecified atrial fibrillation: Secondary | ICD-10-CM | POA: Diagnosis not present

## 2012-05-24 ENCOUNTER — Emergency Department (HOSPITAL_COMMUNITY)
Admission: EM | Admit: 2012-05-24 | Discharge: 2012-05-24 | Disposition: A | Discharge status: Home / Self Care | Payer: Medicare Other | Attending: Emergency Medicine | Admitting: Emergency Medicine

## 2012-05-24 ENCOUNTER — Emergency Department (HOSPITAL_COMMUNITY): Payer: Medicare Other

## 2012-05-24 ENCOUNTER — Encounter (HOSPITAL_COMMUNITY): Payer: Self-pay | Admitting: Emergency Medicine

## 2012-05-24 DIAGNOSIS — J4489 Other specified chronic obstructive pulmonary disease: Secondary | ICD-10-CM | POA: Diagnosis not present

## 2012-05-24 DIAGNOSIS — M810 Age-related osteoporosis without current pathological fracture: Secondary | ICD-10-CM | POA: Diagnosis not present

## 2012-05-24 DIAGNOSIS — J449 Chronic obstructive pulmonary disease, unspecified: Secondary | ICD-10-CM | POA: Diagnosis not present

## 2012-05-24 DIAGNOSIS — Z7901 Long term (current) use of anticoagulants: Secondary | ICD-10-CM | POA: Insufficient documentation

## 2012-05-24 DIAGNOSIS — M4150 Other secondary scoliosis, site unspecified: Secondary | ICD-10-CM | POA: Diagnosis not present

## 2012-05-24 DIAGNOSIS — W19XXXA Unspecified fall, initial encounter: Secondary | ICD-10-CM | POA: Insufficient documentation

## 2012-05-24 DIAGNOSIS — G319 Degenerative disease of nervous system, unspecified: Secondary | ICD-10-CM | POA: Diagnosis not present

## 2012-05-24 DIAGNOSIS — F17200 Nicotine dependence, unspecified, uncomplicated: Secondary | ICD-10-CM | POA: Diagnosis not present

## 2012-05-24 DIAGNOSIS — R0602 Shortness of breath: Secondary | ICD-10-CM | POA: Diagnosis not present

## 2012-05-24 DIAGNOSIS — S22009A Unspecified fracture of unspecified thoracic vertebra, initial encounter for closed fracture: Secondary | ICD-10-CM | POA: Diagnosis not present

## 2012-05-24 DIAGNOSIS — M549 Dorsalgia, unspecified: Secondary | ICD-10-CM | POA: Insufficient documentation

## 2012-05-24 DIAGNOSIS — S22050A Wedge compression fracture of T5-T6 vertebra, initial encounter for closed fracture: Secondary | ICD-10-CM

## 2012-05-24 DIAGNOSIS — S0990XA Unspecified injury of head, initial encounter: Secondary | ICD-10-CM | POA: Diagnosis not present

## 2012-05-24 DIAGNOSIS — Y92009 Unspecified place in unspecified non-institutional (private) residence as the place of occurrence of the external cause: Secondary | ICD-10-CM | POA: Insufficient documentation

## 2012-05-24 HISTORY — DX: Chronic obstructive pulmonary disease, unspecified: J44.9

## 2012-05-24 LAB — PROTIME-INR
INR: 2.11 — ABNORMAL HIGH (ref 0.00–1.49)
Prothrombin Time: 22.8 seconds — ABNORMAL HIGH (ref 11.6–15.2)

## 2012-05-24 LAB — CBC
MCH: 30.4 pg (ref 26.0–34.0)
MCHC: 33.5 g/dL (ref 30.0–36.0)
MCV: 90.7 fL (ref 78.0–100.0)
Platelets: 208 10*3/uL (ref 150–400)
RBC: 3.75 MIL/uL — ABNORMAL LOW (ref 3.87–5.11)

## 2012-05-24 LAB — BASIC METABOLIC PANEL
CO2: 22 mEq/L (ref 19–32)
Calcium: 9.6 mg/dL (ref 8.4–10.5)
Creatinine, Ser: 0.75 mg/dL (ref 0.50–1.10)
GFR calc non Af Amer: 76 mL/min — ABNORMAL LOW (ref >90)
Sodium: 138 mEq/L (ref 135–145)

## 2012-05-24 MED ORDER — HYDROMORPHONE HCL PF 1 MG/ML IJ SOLN
0.5000 mg | Freq: Once | INTRAMUSCULAR | Status: AC
  Administered 2012-05-24: 12:00:00 via INTRAVENOUS
  Filled 2012-05-24: qty 1

## 2012-05-24 MED ORDER — ONDANSETRON HCL 4 MG/2ML IJ SOLN
4.0000 mg | Freq: Once | INTRAMUSCULAR | Status: AC
  Administered 2012-05-24: 4 mg via INTRAVENOUS
  Filled 2012-05-24: qty 2

## 2012-05-24 MED ORDER — IPRATROPIUM BROMIDE 0.02 % IN SOLN
RESPIRATORY_TRACT | Status: AC
  Filled 2012-05-24: qty 2.5

## 2012-05-24 MED ORDER — OXYCODONE-ACETAMINOPHEN 5-325 MG PO TABS: 1.0000 | ORAL_TABLET | ORAL | Status: DC | PRN

## 2012-05-24 MED ORDER — PREDNISONE 10 MG PO TABS: 20.0000 mg | ORAL_TABLET | Freq: Every day | ORAL | Status: DC

## 2012-05-24 MED ORDER — ALBUTEROL SULFATE HFA 108 (90 BASE) MCG/ACT IN AERS
2.0000 | INHALATION_SPRAY | RESPIRATORY_TRACT | Status: DC | PRN
  Administered 2012-05-24: 2 via RESPIRATORY_TRACT
  Filled 2012-05-24: qty 6.7

## 2012-05-24 MED ORDER — AEROCHAMBER PLUS FLO-VU LARGE MISC
1.0000 | Freq: Once | Status: AC
  Administered 2012-05-24: 1
  Filled 2012-05-24: qty 1

## 2012-05-24 MED ORDER — PREDNISONE 20 MG PO TABS
40.0000 mg | ORAL_TABLET | Freq: Once | ORAL | Status: AC
  Administered 2012-05-24: 40 mg via ORAL
  Filled 2012-05-24: qty 2

## 2012-05-24 MED ORDER — CALCITONIN (SALMON) 200 UNIT/ACT NA SOLN: 1.0000 | Freq: Every day | NASAL | Status: DC

## 2012-05-24 MED ORDER — IPRATROPIUM BROMIDE 0.02 % IN SOLN
0.5000 mg | RESPIRATORY_TRACT | Status: DC
  Administered 2012-05-24 (×2): 0.5 mg via RESPIRATORY_TRACT
  Filled 2012-05-24: qty 2.5

## 2012-05-24 MED ORDER — ALBUTEROL SULFATE (5 MG/ML) 0.5% IN NEBU
INHALATION_SOLUTION | RESPIRATORY_TRACT | Status: AC
  Filled 2012-05-24: qty 2

## 2012-05-24 MED ORDER — HYDROMORPHONE HCL PF 1 MG/ML IJ SOLN
0.5000 mg | Freq: Once | INTRAMUSCULAR | Status: AC
  Administered 2012-05-24: 11:00:00 via INTRAVENOUS
  Filled 2012-05-24: qty 1

## 2012-05-24 MED ORDER — ALBUTEROL SULFATE (5 MG/ML) 0.5% IN NEBU
2.5000 mg | INHALATION_SOLUTION | RESPIRATORY_TRACT | Status: DC
  Administered 2012-05-24 (×2): 2.5 mg via RESPIRATORY_TRACT
  Filled 2012-05-24: qty 0.5

## 2012-05-24 NOTE — ED Notes (Signed)
Unable to get iv started poor vein iv team called

## 2012-05-24 NOTE — ED Notes (Signed)
rn present when vital signs were taken.

## 2012-05-24 NOTE — ED Notes (Signed)
HQI:[id number]<XB> Expected date:05/24/12<BR> Expected time: 7:28 AM<BR> Means of arrival:Ambulance<BR> Comments:<BR> Difficulty breathing

## 2012-05-24 NOTE — ED Provider Notes (Signed)
History     CSN: [id number]  Arrival date & time 05/24/12  4098   First MD Initiated Contact with Patient 05/24/12 [phone]      Chief Complaint  Patient presents with  . Shortness of Breath    (Consider location/radiation/quality/duration/timing/severity/associated sxs/prior treatment) HPIPeggy Reece Agar [name] is a 78 y.o. female a history of multiple falls in the past as well as Coumadin therapy for atrial fibrillation presents with worsening shortness of breath, recent diagnosis of COPD and multiple falls including the most recent last Friday. She fell in her kitchen on Friday, hit the back of her head, did not lose consciousness. She denies any focal neurologic deficits. Patient presents complaining about upper back pain and shortness of breath. She says her upper back pain is severe, nonradiating, is in the mid back and in the region of prior compression fractures. Does not hurt worse to take a deep breath. She denies any chest pain. She denies any productive cough, fevers or chills. No nausea vomiting or diarrhea. Pain has not gotten better with Percocet at home.  Past Medical History  Diagnosis Date  . Atrial fibrillation   . Hypothyroidism   . Glaucoma(365)   . Neuropathy     PERIPHERAL  . Osteoporosis   . Osteoarthritis   . Fall   . Trimalleolar fracture     LEFT TRIMALLEOLAR FRACTURE, DISLOCATION WITH OBLIQUE FRACTURE OF THE DISTAL FIBULAR DIAPHYSIS, AND NOTED BEING MILDLY COMMINUTED  . COPD (chronic obstructive pulmonary disease)     Past Surgical History  Procedure Date  . Orif tibia & fibula fractures   . Vaginal hysterectomy     History reviewed. No pertinent family history.  History  Substance Use Topics  . Smoking status: Never Smoker   . Smokeless tobacco: Not on file  . Alcohol Use: No    OB History    Grav Para Term Preterm Abortions TAB SAB Ect Mult Living                  Review of Systems At least 10pt or greater review of systems completed and are  negative except where specified in the HPI.  Allergies  Review of patient's allergies indicates no known allergies.  Home Medications   Current Outpatient Rx  Name Route Sig Dispense Refill  . CALCIUM PO Oral Take by mouth 2 (two) times daily.      . DORZOLAMIDE HCL-TIMOLOL MAL 22.3-6.8 MG/ML OP SOLN Both Eyes Place 1 drop into both eyes 2 (two) times daily.      . ERGOCALCIFEROL 50000 UNITS PO CAPS Oral Take 50,000 Units by mouth once a week.      Marland Kitchen FLUOXETINE HCL 20 MG PO TABS Oral Take 20 mg by mouth daily.      Marland Kitchen LEVOTHYROXINE SODIUM 112 MCG PO TABS Oral Take 112 mcg by mouth daily.      Marland Kitchen EYE VITAMINS PO Oral Take by mouth daily.      Marland Kitchen PILOCARPINE HCL 4 % OP SOLN Both Eyes Place 1 drop into both eyes 2 (two) times daily.      . TRAVOPROST 0.004 % OP SOLN Both Eyes Place 1 drop into both eyes at bedtime.      . WARFARIN SODIUM 5 MG PO TABS Oral Take 5 mg by mouth daily. AS DIRECTED       BP 146/65  Pulse 33  Temp 99 F (37.2 C) (Oral)  Resp 25  SpO2 94%  Physical Exam  Nursing notes reviewed.  Electronic medical record reviewed. VITAL SIGNS:   Filed Vitals:   05/24/12 0821 05/24/12 0828 05/24/12 0909  BP:  146/65   Pulse:  33   Temp:  98.2 F (36.8 C) 99 F (37.2 C)  TempSrc:  Oral Oral  Resp:  25   SpO2: 98% 94%    CONSTITUTIONAL: Awake, oriented, appears non-toxic HENT: Atraumatic, normocephalic, oral mucosa pink and moist, airway patent. Nares patent without drainage. External ears normal. EYES: Conjunctiva clear, EOMI, people in the right is 5 mm and not perfectly round-history of glaucoma in this eye, left eye 3 mm, bilateral pseudophakia NECK: Trachea midline, non-tender, supple CARDIOVASCULAR: Normal heart rate, irregular rhythm, No murmurs, rubs, gallops PULMONARY/CHEST: Clear to auscultation, no rhonchi, wheezes, or rales. Symmetrical breath sounds. Non-tender. ABDOMINAL: Non-distended, soft, non-tender - no rebound or guarding.  BS normal. NEUROLOGIC:  Non-focal, moving all four extremities, no gross sensory or motor deficits. BACK: Tenderness to palpation in the midline T8-T10 EXTREMITIES: No clubbing, cyanosis, or edema SKIN: Warm, Dry, No erythema, No rash  ED Course  Procedures (including critical care time)   Labs Reviewed  CBC  BASIC METABOLIC PANEL  PROTIME-INR   Dg Chest 2 View  05/24/2012  *RADIOLOGY REPORT*  Clinical Data: Back pain, smoker, shortness of breath, COPD  CHEST - 2 VIEW  Comparison: 04/01/2012  Findings: Enlargement of cardiac silhouette. Calcified tortuous aorta. Moderate sized hiatal hernia. Pulmonary vascular congestion. Emphysematous changes without acute infiltrate, pleural effusion or pneumothorax. Diffuse osseous demineralization with marked compression deformity of a lower thoracic vertebra, unchanged.  IMPRESSION: Enlargement of cardiac silhouette with pulmonary vascular congestion. Moderate sized hiatal hernia. Changes of COPD. Old inferior thoracic compression fracture.   Original Report Authenticated By: Lollie Marrow, M.D.    Dg Thoracic Spine 2 View  05/24/2012  *RADIOLOGY REPORT*  Clinical Data: Back pain  THORACIC SPINE - 2 VIEW  Comparison: Chest radiograph 04/01/2012  Findings: Osseous demineralization. 12 pairs of ribs. Old marked anterior compression fracture of the T11 with associated focal thoracic kyphosis. Additional moderate anterior compression fracture of T5 vertebral body with 50-60% anterior height loss, new since previous exam. No additional fracture, subluxation or bone destruction. Cardiac silhouette appears enlarged. Visualized portions of the posterior ribs appear intact.  IMPRESSION: New moderate anterior compression fracture of the T5 vertebral body with 50-60% anterior height loss. Old marked anterior compression fracture of the T11 vertebral body.   Original Report Authenticated By: Lollie Marrow, M.D.    Ct Head Wo Contrast  05/24/2012  *RADIOLOGY REPORT*  Clinical Data: Fall 3 days  ago.  CT HEAD WITHOUT CONTRAST  Technique:  Contiguous axial images were obtained from the base of the skull through the vertex without contrast.  Comparison: None.  Findings: No skull fracture or intracranial hemorrhage.  Small vessel disease type changes without CT evidence of large acute infarct.  Global atrophy without hydrocephalus.  No intracranial mass lesion detected on this unenhanced exam.  Vascular calcifications.  IMPRESSION: No skull fracture or intracranial hemorrhage.  Small vessel disease type changes without CT evidence of large acute infarct.  Global atrophy without hydrocephalus.   Original Report Authenticated By: Fuller Canada, M.D.      1. Wedge compression fracture of T5 vertebra   2. Back pain   3. Shortness of breath       MDM  [name] is a 78 y.o. female history of atrial fibrillation on Coumadin therapy presenting with a history of falls, hitting her head without  loss of consciousness as well as worsened back pain which is now severe in the mid to lower thoracic region.  We'll treat the patient's pain aggressively obtain some basic labs including a CBC to rule out anemia as a cause of her shortness of breath, chest x-ray but a free focal infiltrates rule out pneumonia as a cause of her shortness of breath, also a dedicated thoracic spine series to see she's got worsening of compression fractures versus other fracture in the thoracic spine. Also obtain a CT of the head, her she has a normal neurologic exam-he did fall on Friday, but my suspicion of an intracranial bleed causing mass effect is extremely low.  Patient has no history of DVT or PE.  Patient does demonstrate a new moderate anterior compression fracture at T5-this is gone a 50-60% anterior height loss. I discussed this with neurosurgery on call, who agreed that a vertebroplasty has limited efficacy and would not be appropriate. No other aggressive therapies are needed at this time.   Patient's granddaughter  will be able to stay with her, and she'll have pain medicine to go home with. I discussed this medical plan with the patient, her daughter and granddaughter - they are urged to extreme caution with use of pain medicine as it may increase her risk of falls.  She does not ambulate at home due to her severe neuropathy instead has a scooter chair which she is reticent to use. I have reiterated to the patient that she must use her scooter chair to get around her home because of her extreme osteopenia/osteoporosis. She understands that any further falls may result in either lethal bleed or fracture such as hip fracture with this subsequent increase in morbidity and mortality.  I have prescribed calcitonin to use intranasally every day for 2-4 weeks as an adjunct if she is not getting adequate pain relief from oral pain relievers. They do have the option of not filling this prescription should it be too expensive- family and patient acknowledged understanding.  I explained the diagnosis and have given explicit precautions to return to the ER including worsening pain despite medication or any other new or worsening symptoms. The patient understands and accepts the medical plan as it's been dictated and I have answered their questions. Discharge instructions concerning home care and prescriptions have been given.  The patient is STABLE and is discharged to home in good condition.           Jones Skene, MD 05/25/12 [phone]

## 2012-05-24 NOTE — ED Notes (Signed)
Pt brought in by ems for SOB pt c/o mid back pain after a fall last week, bruise notedfamily stated that pt started having sob,doe last night with has progressed to bad.pt  given Duoneb in route to [facility]

## 2012-05-24 NOTE — ED Notes (Addendum)
Pt and family given instructions for the use of the aerochamber and albuterol HFA voiced understanding and concerns

## 2012-05-25 DIAGNOSIS — S22009A Unspecified fracture of unspecified thoracic vertebra, initial encounter for closed fracture: Secondary | ICD-10-CM | POA: Diagnosis not present

## 2012-05-25 DIAGNOSIS — M810 Age-related osteoporosis without current pathological fracture: Secondary | ICD-10-CM | POA: Diagnosis not present

## 2012-05-25 DIAGNOSIS — G894 Chronic pain syndrome: Secondary | ICD-10-CM | POA: Diagnosis not present

## 2012-06-16 DIAGNOSIS — E039 Hypothyroidism, unspecified: Secondary | ICD-10-CM | POA: Diagnosis not present

## 2012-06-16 DIAGNOSIS — Z79899 Other long term (current) drug therapy: Secondary | ICD-10-CM | POA: Diagnosis not present

## 2012-06-16 DIAGNOSIS — Z23 Encounter for immunization: Secondary | ICD-10-CM | POA: Diagnosis not present

## 2012-06-16 DIAGNOSIS — I720 Aneurysm of carotid artery: Secondary | ICD-10-CM | POA: Diagnosis not present

## 2012-06-16 DIAGNOSIS — G894 Chronic pain syndrome: Secondary | ICD-10-CM | POA: Diagnosis not present

## 2012-06-16 DIAGNOSIS — D518 Other vitamin B12 deficiency anemias: Secondary | ICD-10-CM | POA: Diagnosis not present

## 2012-06-17 DIAGNOSIS — Z23 Encounter for immunization: Secondary | ICD-10-CM | POA: Diagnosis not present

## 2012-07-19 DIAGNOSIS — G894 Chronic pain syndrome: Secondary | ICD-10-CM | POA: Diagnosis not present

## 2012-07-19 DIAGNOSIS — D518 Other vitamin B12 deficiency anemias: Secondary | ICD-10-CM | POA: Diagnosis not present

## 2012-07-19 DIAGNOSIS — Z5181 Encounter for therapeutic drug level monitoring: Secondary | ICD-10-CM | POA: Diagnosis not present

## 2012-07-19 DIAGNOSIS — G9009 Other idiopathic peripheral autonomic neuropathy: Secondary | ICD-10-CM | POA: Diagnosis not present

## 2012-07-19 DIAGNOSIS — Z79899 Other long term (current) drug therapy: Secondary | ICD-10-CM | POA: Diagnosis not present

## 2012-07-19 DIAGNOSIS — M810 Age-related osteoporosis without current pathological fracture: Secondary | ICD-10-CM | POA: Diagnosis not present

## 2012-08-16 DIAGNOSIS — S22009A Unspecified fracture of unspecified thoracic vertebra, initial encounter for closed fracture: Secondary | ICD-10-CM | POA: Diagnosis not present

## 2012-08-16 DIAGNOSIS — I720 Aneurysm of carotid artery: Secondary | ICD-10-CM | POA: Diagnosis not present

## 2012-08-16 DIAGNOSIS — M810 Age-related osteoporosis without current pathological fracture: Secondary | ICD-10-CM | POA: Diagnosis not present

## 2012-08-16 DIAGNOSIS — D518 Other vitamin B12 deficiency anemias: Secondary | ICD-10-CM | POA: Diagnosis not present

## 2012-08-22 DIAGNOSIS — H4011X0 Primary open-angle glaucoma, stage unspecified: Secondary | ICD-10-CM | POA: Diagnosis not present

## 2012-08-22 DIAGNOSIS — H353190 Nonexudative age-related macular degeneration, unspecified eye, stage unspecified: Secondary | ICD-10-CM | POA: Diagnosis not present

## 2012-08-22 DIAGNOSIS — H409 Unspecified glaucoma: Secondary | ICD-10-CM | POA: Diagnosis not present

## 2012-08-22 DIAGNOSIS — Z961 Presence of intraocular lens: Secondary | ICD-10-CM | POA: Diagnosis not present

## 2012-08-29 DIAGNOSIS — S20219A Contusion of unspecified front wall of thorax, initial encounter: Secondary | ICD-10-CM | POA: Diagnosis not present

## 2012-09-19 DIAGNOSIS — I4891 Unspecified atrial fibrillation: Secondary | ICD-10-CM | POA: Diagnosis not present

## 2012-09-19 DIAGNOSIS — Z5181 Encounter for therapeutic drug level monitoring: Secondary | ICD-10-CM | POA: Diagnosis not present

## 2012-09-19 DIAGNOSIS — G894 Chronic pain syndrome: Secondary | ICD-10-CM | POA: Diagnosis not present

## 2012-09-19 DIAGNOSIS — Z79899 Other long term (current) drug therapy: Secondary | ICD-10-CM | POA: Diagnosis not present

## 2012-09-19 DIAGNOSIS — S22009A Unspecified fracture of unspecified thoracic vertebra, initial encounter for closed fracture: Secondary | ICD-10-CM | POA: Diagnosis not present

## 2012-09-19 DIAGNOSIS — D518 Other vitamin B12 deficiency anemias: Secondary | ICD-10-CM | POA: Diagnosis not present

## 2012-10-20 DIAGNOSIS — G894 Chronic pain syndrome: Secondary | ICD-10-CM | POA: Diagnosis not present

## 2012-10-20 DIAGNOSIS — D518 Other vitamin B12 deficiency anemias: Secondary | ICD-10-CM | POA: Diagnosis not present

## 2012-10-20 DIAGNOSIS — I720 Aneurysm of carotid artery: Secondary | ICD-10-CM | POA: Diagnosis not present

## 2012-10-20 DIAGNOSIS — I4891 Unspecified atrial fibrillation: Secondary | ICD-10-CM | POA: Diagnosis not present

## 2012-11-16 DIAGNOSIS — I720 Aneurysm of carotid artery: Secondary | ICD-10-CM | POA: Diagnosis not present

## 2012-11-16 DIAGNOSIS — G894 Chronic pain syndrome: Secondary | ICD-10-CM | POA: Diagnosis not present

## 2012-11-16 DIAGNOSIS — D518 Other vitamin B12 deficiency anemias: Secondary | ICD-10-CM | POA: Diagnosis not present

## 2012-12-01 DIAGNOSIS — D518 Other vitamin B12 deficiency anemias: Secondary | ICD-10-CM | POA: Diagnosis not present

## 2012-12-01 DIAGNOSIS — Z79899 Other long term (current) drug therapy: Secondary | ICD-10-CM | POA: Diagnosis not present

## 2012-12-01 DIAGNOSIS — E039 Hypothyroidism, unspecified: Secondary | ICD-10-CM | POA: Diagnosis not present

## 2012-12-01 DIAGNOSIS — G894 Chronic pain syndrome: Secondary | ICD-10-CM | POA: Diagnosis not present

## 2012-12-01 DIAGNOSIS — I720 Aneurysm of carotid artery: Secondary | ICD-10-CM | POA: Diagnosis not present

## 2012-12-14 DIAGNOSIS — I4891 Unspecified atrial fibrillation: Secondary | ICD-10-CM | POA: Diagnosis not present

## 2012-12-14 DIAGNOSIS — G894 Chronic pain syndrome: Secondary | ICD-10-CM | POA: Diagnosis not present

## 2012-12-14 DIAGNOSIS — D518 Other vitamin B12 deficiency anemias: Secondary | ICD-10-CM | POA: Diagnosis not present

## 2012-12-22 DIAGNOSIS — H4011X0 Primary open-angle glaucoma, stage unspecified: Secondary | ICD-10-CM | POA: Diagnosis not present

## 2012-12-22 DIAGNOSIS — Z961 Presence of intraocular lens: Secondary | ICD-10-CM | POA: Diagnosis not present

## 2012-12-22 DIAGNOSIS — H353190 Nonexudative age-related macular degeneration, unspecified eye, stage unspecified: Secondary | ICD-10-CM | POA: Diagnosis not present

## 2012-12-22 DIAGNOSIS — H409 Unspecified glaucoma: Secondary | ICD-10-CM | POA: Diagnosis not present

## 2013-01-11 DIAGNOSIS — D518 Other vitamin B12 deficiency anemias: Secondary | ICD-10-CM | POA: Diagnosis not present

## 2013-01-11 DIAGNOSIS — I4891 Unspecified atrial fibrillation: Secondary | ICD-10-CM | POA: Diagnosis not present

## 2013-01-11 DIAGNOSIS — E039 Hypothyroidism, unspecified: Secondary | ICD-10-CM | POA: Diagnosis not present

## 2013-02-08 DIAGNOSIS — G9009 Other idiopathic peripheral autonomic neuropathy: Secondary | ICD-10-CM | POA: Diagnosis not present

## 2013-02-08 DIAGNOSIS — I4891 Unspecified atrial fibrillation: Secondary | ICD-10-CM | POA: Diagnosis not present

## 2013-02-08 DIAGNOSIS — E782 Mixed hyperlipidemia: Secondary | ICD-10-CM | POA: Diagnosis not present

## 2013-03-08 DIAGNOSIS — I4891 Unspecified atrial fibrillation: Secondary | ICD-10-CM | POA: Diagnosis not present

## 2013-03-08 DIAGNOSIS — Z79899 Other long term (current) drug therapy: Secondary | ICD-10-CM | POA: Diagnosis not present

## 2013-03-08 DIAGNOSIS — D518 Other vitamin B12 deficiency anemias: Secondary | ICD-10-CM | POA: Diagnosis not present

## 2013-03-08 DIAGNOSIS — E782 Mixed hyperlipidemia: Secondary | ICD-10-CM | POA: Diagnosis not present

## 2013-03-08 DIAGNOSIS — Z5181 Encounter for therapeutic drug level monitoring: Secondary | ICD-10-CM | POA: Diagnosis not present

## 2013-03-08 DIAGNOSIS — G894 Chronic pain syndrome: Secondary | ICD-10-CM | POA: Diagnosis not present

## 2013-04-05 DIAGNOSIS — I4891 Unspecified atrial fibrillation: Secondary | ICD-10-CM | POA: Diagnosis not present

## 2013-04-05 DIAGNOSIS — G894 Chronic pain syndrome: Secondary | ICD-10-CM | POA: Diagnosis not present

## 2013-04-18 DIAGNOSIS — M810 Age-related osteoporosis without current pathological fracture: Secondary | ICD-10-CM | POA: Diagnosis not present

## 2013-04-18 DIAGNOSIS — E782 Mixed hyperlipidemia: Secondary | ICD-10-CM | POA: Diagnosis not present

## 2013-04-18 DIAGNOSIS — G894 Chronic pain syndrome: Secondary | ICD-10-CM | POA: Diagnosis not present

## 2013-04-18 DIAGNOSIS — I4891 Unspecified atrial fibrillation: Secondary | ICD-10-CM | POA: Diagnosis not present

## 2013-04-24 DIAGNOSIS — H409 Unspecified glaucoma: Secondary | ICD-10-CM | POA: Diagnosis not present

## 2013-04-24 DIAGNOSIS — H4011X0 Primary open-angle glaucoma, stage unspecified: Secondary | ICD-10-CM | POA: Diagnosis not present

## 2013-04-24 DIAGNOSIS — H353190 Nonexudative age-related macular degeneration, unspecified eye, stage unspecified: Secondary | ICD-10-CM | POA: Diagnosis not present

## 2013-05-02 DIAGNOSIS — E039 Hypothyroidism, unspecified: Secondary | ICD-10-CM | POA: Diagnosis not present

## 2013-05-02 DIAGNOSIS — R791 Abnormal coagulation profile: Secondary | ICD-10-CM | POA: Diagnosis not present

## 2013-05-02 DIAGNOSIS — IMO0002 Reserved for concepts with insufficient information to code with codable children: Secondary | ICD-10-CM | POA: Diagnosis not present

## 2013-05-02 DIAGNOSIS — E559 Vitamin D deficiency, unspecified: Secondary | ICD-10-CM | POA: Diagnosis not present

## 2013-05-02 DIAGNOSIS — E538 Deficiency of other specified B group vitamins: Secondary | ICD-10-CM | POA: Diagnosis not present

## 2013-05-03 ENCOUNTER — Other Ambulatory Visit: Payer: Self-pay | Admitting: Family Medicine

## 2013-05-03 ENCOUNTER — Ambulatory Visit
Admission: RE | Admit: 2013-05-03 | Discharge: 2013-05-03 | Disposition: A | Discharge status: Home / Self Care | Payer: Medicare Other | Source: Ambulatory Visit | Attending: Family Medicine | Admitting: Family Medicine

## 2013-05-03 DIAGNOSIS — M79609 Pain in unspecified limb: Secondary | ICD-10-CM

## 2013-05-03 DIAGNOSIS — M25559 Pain in unspecified hip: Secondary | ICD-10-CM | POA: Diagnosis not present

## 2013-05-09 DIAGNOSIS — R791 Abnormal coagulation profile: Secondary | ICD-10-CM | POA: Diagnosis not present

## 2013-05-16 DIAGNOSIS — R791 Abnormal coagulation profile: Secondary | ICD-10-CM | POA: Diagnosis not present

## 2013-06-01 DIAGNOSIS — Z23 Encounter for immunization: Secondary | ICD-10-CM | POA: Diagnosis not present

## 2013-06-01 DIAGNOSIS — E538 Deficiency of other specified B group vitamins: Secondary | ICD-10-CM | POA: Diagnosis not present

## 2013-06-01 DIAGNOSIS — R791 Abnormal coagulation profile: Secondary | ICD-10-CM | POA: Diagnosis not present

## 2013-06-05 DIAGNOSIS — R791 Abnormal coagulation profile: Secondary | ICD-10-CM | POA: Diagnosis not present

## 2013-06-12 DIAGNOSIS — R791 Abnormal coagulation profile: Secondary | ICD-10-CM | POA: Diagnosis not present

## 2013-06-28 DIAGNOSIS — R791 Abnormal coagulation profile: Secondary | ICD-10-CM | POA: Diagnosis not present

## 2013-07-05 DIAGNOSIS — E538 Deficiency of other specified B group vitamins: Secondary | ICD-10-CM | POA: Diagnosis not present

## 2013-07-05 DIAGNOSIS — R791 Abnormal coagulation profile: Secondary | ICD-10-CM | POA: Diagnosis not present

## 2013-07-20 DIAGNOSIS — R791 Abnormal coagulation profile: Secondary | ICD-10-CM | POA: Diagnosis not present

## 2013-08-21 DIAGNOSIS — Z7901 Long term (current) use of anticoagulants: Secondary | ICD-10-CM | POA: Diagnosis not present

## 2013-08-29 ENCOUNTER — Other Ambulatory Visit (HOSPITAL_COMMUNITY): Payer: Self-pay | Admitting: Family Medicine

## 2013-08-29 DIAGNOSIS — I4891 Unspecified atrial fibrillation: Secondary | ICD-10-CM | POA: Diagnosis not present

## 2013-08-29 DIAGNOSIS — E538 Deficiency of other specified B group vitamins: Secondary | ICD-10-CM | POA: Diagnosis not present

## 2013-08-29 DIAGNOSIS — R0989 Other specified symptoms and signs involving the circulatory and respiratory systems: Principal | ICD-10-CM

## 2013-08-29 DIAGNOSIS — E039 Hypothyroidism, unspecified: Secondary | ICD-10-CM | POA: Diagnosis not present

## 2013-08-29 DIAGNOSIS — R0609 Other forms of dyspnea: Secondary | ICD-10-CM

## 2013-08-29 DIAGNOSIS — Z79899 Other long term (current) drug therapy: Secondary | ICD-10-CM | POA: Diagnosis not present

## 2013-09-05 ENCOUNTER — Encounter (HOSPITAL_COMMUNITY): Payer: Medicare Other

## 2013-09-08 DIAGNOSIS — Z79899 Other long term (current) drug therapy: Secondary | ICD-10-CM | POA: Diagnosis not present

## 2013-09-08 DIAGNOSIS — Z7901 Long term (current) use of anticoagulants: Secondary | ICD-10-CM | POA: Diagnosis not present

## 2013-09-12 ENCOUNTER — Ambulatory Visit (HOSPITAL_COMMUNITY)
Admission: RE | Admit: 2013-09-12 | Discharge: 2013-09-12 | Disposition: A | Discharge status: Home / Self Care | Payer: Medicare Other | Source: Ambulatory Visit | Attending: Family Medicine | Admitting: Family Medicine

## 2013-09-12 DIAGNOSIS — R0609 Other forms of dyspnea: Secondary | ICD-10-CM | POA: Insufficient documentation

## 2013-09-12 DIAGNOSIS — Z87891 Personal history of nicotine dependence: Secondary | ICD-10-CM | POA: Diagnosis not present

## 2013-09-12 DIAGNOSIS — R0989 Other specified symptoms and signs involving the circulatory and respiratory systems: Secondary | ICD-10-CM | POA: Diagnosis not present

## 2013-09-12 MED ORDER — ALBUTEROL SULFATE (2.5 MG/3ML) 0.083% IN NEBU
2.5000 mg | INHALATION_SOLUTION | Freq: Once | RESPIRATORY_TRACT | Status: AC
  Administered 2013-09-12: 2.5 mg via RESPIRATORY_TRACT

## 2013-09-14 LAB — PULMONARY FUNCTION TEST
FEF 25-75 POST: 0.53 L/s
FEF 25-75 PRE: 0.71 L/s
FEF2575-%CHANGE-POST: -25 %
FEF2575-%PRED-PRE: 57 %
FEF2575-%Pred-Post: 42 %
FEV1-%Change-Post: -5 %
FEV1-%PRED-PRE: 52 %
FEV1-%Pred-Post: 49 %
FEV1-PRE: 0.98 L
FEV1-Post: 0.92 L
FEV1FVC-%Change-Post: 0 %
FEV1FVC-%PRED-PRE: 97 %
FEV6-%CHANGE-POST: -5 %
FEV6-%PRED-POST: 54 %
FEV6-%Pred-Pre: 57 %
FEV6-POST: 1.29 L
FEV6-Pre: 1.37 L
FEV6FVC-%Pred-Post: 106 %
FEV6FVC-%Pred-Pre: 106 %
FVC-%CHANGE-POST: -5 %
FVC-%PRED-POST: 51 %
FVC-%PRED-PRE: 54 %
FVC-POST: 1.29 L
FVC-Pre: 1.37 L
POST FEV1/FVC RATIO: 71 %
Post FEV6/FVC ratio: 100 %
Pre FEV1/FVC ratio: 71 %
Pre FEV6/FVC Ratio: 100 %
RV % pred: 86 %
RV: 2.19 L
TLC % PRED: 68 %
TLC: 3.57 L

## 2013-09-22 DIAGNOSIS — Z7901 Long term (current) use of anticoagulants: Secondary | ICD-10-CM | POA: Diagnosis not present

## 2013-10-09 DIAGNOSIS — I4891 Unspecified atrial fibrillation: Secondary | ICD-10-CM | POA: Diagnosis not present

## 2013-10-31 DIAGNOSIS — Z7901 Long term (current) use of anticoagulants: Secondary | ICD-10-CM | POA: Diagnosis not present

## 2013-11-21 DIAGNOSIS — Z7901 Long term (current) use of anticoagulants: Secondary | ICD-10-CM | POA: Diagnosis not present

## 2013-12-08 DIAGNOSIS — R791 Abnormal coagulation profile: Secondary | ICD-10-CM | POA: Diagnosis not present

## 2014-01-11 DIAGNOSIS — Z5181 Encounter for therapeutic drug level monitoring: Secondary | ICD-10-CM | POA: Diagnosis not present

## 2014-01-11 DIAGNOSIS — Z961 Presence of intraocular lens: Secondary | ICD-10-CM | POA: Diagnosis not present

## 2014-01-11 DIAGNOSIS — H353190 Nonexudative age-related macular degeneration, unspecified eye, stage unspecified: Secondary | ICD-10-CM | POA: Diagnosis not present

## 2014-01-11 DIAGNOSIS — Z7901 Long term (current) use of anticoagulants: Secondary | ICD-10-CM | POA: Diagnosis not present

## 2014-01-11 DIAGNOSIS — H4011X0 Primary open-angle glaucoma, stage unspecified: Secondary | ICD-10-CM | POA: Diagnosis not present

## 2014-01-11 DIAGNOSIS — H409 Unspecified glaucoma: Secondary | ICD-10-CM | POA: Diagnosis not present

## 2014-01-16 DIAGNOSIS — R791 Abnormal coagulation profile: Secondary | ICD-10-CM | POA: Diagnosis not present

## 2014-01-18 DIAGNOSIS — R791 Abnormal coagulation profile: Secondary | ICD-10-CM | POA: Diagnosis not present

## 2014-01-25 DIAGNOSIS — R791 Abnormal coagulation profile: Secondary | ICD-10-CM | POA: Diagnosis not present

## 2014-02-08 DIAGNOSIS — I4891 Unspecified atrial fibrillation: Secondary | ICD-10-CM | POA: Diagnosis not present

## 2014-03-16 DIAGNOSIS — Z7901 Long term (current) use of anticoagulants: Secondary | ICD-10-CM | POA: Diagnosis not present

## 2014-03-16 DIAGNOSIS — Z79899 Other long term (current) drug therapy: Secondary | ICD-10-CM | POA: Diagnosis not present

## 2014-03-16 DIAGNOSIS — E039 Hypothyroidism, unspecified: Secondary | ICD-10-CM | POA: Diagnosis not present

## 2014-03-16 DIAGNOSIS — E538 Deficiency of other specified B group vitamins: Secondary | ICD-10-CM | POA: Diagnosis not present

## 2014-03-16 DIAGNOSIS — G8929 Other chronic pain: Secondary | ICD-10-CM | POA: Diagnosis not present

## 2014-03-16 DIAGNOSIS — I4891 Unspecified atrial fibrillation: Secondary | ICD-10-CM | POA: Diagnosis not present

## 2014-03-16 DIAGNOSIS — E559 Vitamin D deficiency, unspecified: Secondary | ICD-10-CM | POA: Diagnosis not present

## 2014-04-18 DIAGNOSIS — R791 Abnormal coagulation profile: Secondary | ICD-10-CM | POA: Diagnosis not present

## 2014-05-04 DIAGNOSIS — H9190 Unspecified hearing loss, unspecified ear: Secondary | ICD-10-CM | POA: Diagnosis not present

## 2014-05-04 DIAGNOSIS — Z23 Encounter for immunization: Secondary | ICD-10-CM | POA: Diagnosis not present

## 2014-05-04 DIAGNOSIS — R791 Abnormal coagulation profile: Secondary | ICD-10-CM | POA: Diagnosis not present

## 2014-05-11 DIAGNOSIS — H903 Sensorineural hearing loss, bilateral: Secondary | ICD-10-CM | POA: Diagnosis not present

## 2014-06-05 DIAGNOSIS — Z7901 Long term (current) use of anticoagulants: Secondary | ICD-10-CM | POA: Diagnosis not present

## 2014-07-03 DIAGNOSIS — S7002XA Contusion of left hip, initial encounter: Secondary | ICD-10-CM | POA: Diagnosis not present

## 2014-07-03 DIAGNOSIS — S72112A Displaced fracture of greater trochanter of left femur, initial encounter for closed fracture: Secondary | ICD-10-CM | POA: Diagnosis not present

## 2014-07-03 DIAGNOSIS — S300XXA Contusion of lower back and pelvis, initial encounter: Secondary | ICD-10-CM | POA: Diagnosis not present

## 2014-07-03 DIAGNOSIS — S72115A Nondisplaced fracture of greater trochanter of left femur, initial encounter for closed fracture: Secondary | ICD-10-CM | POA: Diagnosis not present

## 2014-07-06 DIAGNOSIS — R0609 Other forms of dyspnea: Secondary | ICD-10-CM | POA: Diagnosis not present

## 2014-07-06 DIAGNOSIS — W19XXXA Unspecified fall, initial encounter: Secondary | ICD-10-CM | POA: Diagnosis not present

## 2014-07-06 DIAGNOSIS — S72112A Displaced fracture of greater trochanter of left femur, initial encounter for closed fracture: Secondary | ICD-10-CM | POA: Diagnosis not present

## 2014-07-06 DIAGNOSIS — R791 Abnormal coagulation profile: Secondary | ICD-10-CM | POA: Diagnosis not present

## 2014-07-13 DIAGNOSIS — R791 Abnormal coagulation profile: Secondary | ICD-10-CM | POA: Diagnosis not present

## 2014-07-30 DIAGNOSIS — Z7901 Long term (current) use of anticoagulants: Secondary | ICD-10-CM | POA: Diagnosis not present

## 2014-07-30 DIAGNOSIS — R791 Abnormal coagulation profile: Secondary | ICD-10-CM | POA: Diagnosis not present

## 2014-08-03 DIAGNOSIS — S72115A Nondisplaced fracture of greater trochanter of left femur, initial encounter for closed fracture: Secondary | ICD-10-CM | POA: Diagnosis not present

## 2014-08-03 DIAGNOSIS — S72102A Unspecified trochanteric fracture of left femur, initial encounter for closed fracture: Secondary | ICD-10-CM | POA: Diagnosis not present

## 2014-08-17 HISTORY — PX: ORIF TIBIA & FIBULA FRACTURES: SHX2131

## 2014-09-17 DIAGNOSIS — R791 Abnormal coagulation profile: Secondary | ICD-10-CM | POA: Diagnosis not present

## 2014-10-16 DIAGNOSIS — Z1389 Encounter for screening for other disorder: Secondary | ICD-10-CM | POA: Diagnosis not present

## 2014-10-16 DIAGNOSIS — I4891 Unspecified atrial fibrillation: Secondary | ICD-10-CM | POA: Diagnosis not present

## 2014-10-16 DIAGNOSIS — R791 Abnormal coagulation profile: Secondary | ICD-10-CM | POA: Diagnosis not present

## 2014-10-16 DIAGNOSIS — E559 Vitamin D deficiency, unspecified: Secondary | ICD-10-CM | POA: Diagnosis not present

## 2014-10-16 DIAGNOSIS — G8929 Other chronic pain: Secondary | ICD-10-CM | POA: Diagnosis not present

## 2014-10-16 DIAGNOSIS — N183 Chronic kidney disease, stage 3 (moderate): Secondary | ICD-10-CM | POA: Diagnosis not present

## 2014-10-16 DIAGNOSIS — Z79899 Other long term (current) drug therapy: Secondary | ICD-10-CM | POA: Diagnosis not present

## 2014-10-16 DIAGNOSIS — E538 Deficiency of other specified B group vitamins: Secondary | ICD-10-CM | POA: Diagnosis not present

## 2014-10-16 DIAGNOSIS — E039 Hypothyroidism, unspecified: Secondary | ICD-10-CM | POA: Diagnosis not present

## 2014-10-16 DIAGNOSIS — Z9181 History of falling: Secondary | ICD-10-CM | POA: Diagnosis not present

## 2014-11-19 DIAGNOSIS — R791 Abnormal coagulation profile: Secondary | ICD-10-CM | POA: Diagnosis not present

## 2014-12-03 DIAGNOSIS — H4011X3 Primary open-angle glaucoma, severe stage: Secondary | ICD-10-CM | POA: Diagnosis not present

## 2014-12-03 DIAGNOSIS — H3531 Nonexudative age-related macular degeneration: Secondary | ICD-10-CM | POA: Diagnosis not present

## 2014-12-10 DIAGNOSIS — R791 Abnormal coagulation profile: Secondary | ICD-10-CM | POA: Diagnosis not present

## 2015-01-16 DIAGNOSIS — R791 Abnormal coagulation profile: Secondary | ICD-10-CM | POA: Diagnosis not present

## 2015-01-28 DIAGNOSIS — R791 Abnormal coagulation profile: Secondary | ICD-10-CM | POA: Diagnosis not present

## 2015-02-20 DIAGNOSIS — Z7901 Long term (current) use of anticoagulants: Secondary | ICD-10-CM | POA: Diagnosis not present

## 2015-03-26 DIAGNOSIS — Z7901 Long term (current) use of anticoagulants: Secondary | ICD-10-CM | POA: Diagnosis not present

## 2015-04-19 DIAGNOSIS — I4891 Unspecified atrial fibrillation: Secondary | ICD-10-CM | POA: Diagnosis not present

## 2015-04-19 DIAGNOSIS — Z79899 Other long term (current) drug therapy: Secondary | ICD-10-CM | POA: Diagnosis not present

## 2015-04-19 DIAGNOSIS — R079 Chest pain, unspecified: Secondary | ICD-10-CM | POA: Diagnosis not present

## 2015-04-19 DIAGNOSIS — G8929 Other chronic pain: Secondary | ICD-10-CM | POA: Diagnosis not present

## 2015-04-19 DIAGNOSIS — E559 Vitamin D deficiency, unspecified: Secondary | ICD-10-CM | POA: Diagnosis not present

## 2015-04-19 DIAGNOSIS — E039 Hypothyroidism, unspecified: Secondary | ICD-10-CM | POA: Diagnosis not present

## 2015-04-19 DIAGNOSIS — N183 Chronic kidney disease, stage 3 (moderate): Secondary | ICD-10-CM | POA: Diagnosis not present

## 2015-04-19 DIAGNOSIS — Z9181 History of falling: Secondary | ICD-10-CM | POA: Diagnosis not present

## 2015-04-29 DIAGNOSIS — Z7901 Long term (current) use of anticoagulants: Secondary | ICD-10-CM | POA: Diagnosis not present

## 2015-04-30 ENCOUNTER — Encounter (HOSPITAL_COMMUNITY): Payer: Self-pay | Admitting: *Deleted

## 2015-04-30 ENCOUNTER — Emergency Department (HOSPITAL_COMMUNITY): Payer: Medicare Other

## 2015-04-30 ENCOUNTER — Emergency Department (HOSPITAL_COMMUNITY)
Admission: EM | Admit: 2015-04-30 | Discharge: 2015-04-30 | Disposition: A | Discharge status: Home / Self Care | Payer: Medicare Other | Attending: Emergency Medicine | Admitting: Emergency Medicine

## 2015-04-30 DIAGNOSIS — I4891 Unspecified atrial fibrillation: Secondary | ICD-10-CM | POA: Insufficient documentation

## 2015-04-30 DIAGNOSIS — J441 Chronic obstructive pulmonary disease with (acute) exacerbation: Secondary | ICD-10-CM | POA: Diagnosis not present

## 2015-04-30 DIAGNOSIS — J449 Chronic obstructive pulmonary disease, unspecified: Secondary | ICD-10-CM | POA: Diagnosis not present

## 2015-04-30 DIAGNOSIS — Z7901 Long term (current) use of anticoagulants: Secondary | ICD-10-CM | POA: Insufficient documentation

## 2015-04-30 DIAGNOSIS — R0602 Shortness of breath: Secondary | ICD-10-CM | POA: Diagnosis not present

## 2015-04-30 DIAGNOSIS — M1990 Unspecified osteoarthritis, unspecified site: Secondary | ICD-10-CM | POA: Diagnosis not present

## 2015-04-30 DIAGNOSIS — G629 Polyneuropathy, unspecified: Secondary | ICD-10-CM | POA: Insufficient documentation

## 2015-04-30 DIAGNOSIS — E039 Hypothyroidism, unspecified: Secondary | ICD-10-CM | POA: Diagnosis not present

## 2015-04-30 DIAGNOSIS — Z79899 Other long term (current) drug therapy: Secondary | ICD-10-CM | POA: Insufficient documentation

## 2015-04-30 DIAGNOSIS — Z87828 Personal history of other (healed) physical injury and trauma: Secondary | ICD-10-CM | POA: Diagnosis not present

## 2015-04-30 LAB — CBC WITH DIFFERENTIAL/PLATELET
BASOS ABS: 0 10*3/uL (ref 0.0–0.1)
Basophils Relative: 0 % (ref 0–1)
EOS ABS: 0 10*3/uL (ref 0.0–0.7)
Eosinophils Relative: 0 % (ref 0–5)
HCT: 35.2 % — ABNORMAL LOW (ref 36.0–46.0)
HEMOGLOBIN: 11.9 g/dL — AB (ref 12.0–15.0)
LYMPHS ABS: 0.4 10*3/uL — AB (ref 0.7–4.0)
LYMPHS PCT: 4 % — AB (ref 12–46)
MCH: 32.1 pg (ref 26.0–34.0)
MCHC: 33.8 g/dL (ref 30.0–36.0)
MCV: 94.9 fL (ref 78.0–100.0)
Monocytes Absolute: 0.3 10*3/uL (ref 0.1–1.0)
Monocytes Relative: 4 % (ref 3–12)
NEUTROS PCT: 92 % — AB (ref 43–77)
Neutro Abs: 8 10*3/uL — ABNORMAL HIGH (ref 1.7–7.7)
Platelets: 204 10*3/uL (ref 150–400)
RBC: 3.71 MIL/uL — AB (ref 3.87–5.11)
RDW: 12.9 % (ref 11.5–15.5)
WBC: 8.8 10*3/uL (ref 4.0–10.5)

## 2015-04-30 LAB — COMPREHENSIVE METABOLIC PANEL
ALT: 12 U/L — ABNORMAL LOW (ref 14–54)
ANION GAP: 8 (ref 5–15)
AST: 19 U/L (ref 15–41)
Albumin: 4.2 g/dL (ref 3.5–5.0)
Alkaline Phosphatase: 53 U/L (ref 38–126)
BUN: 12 mg/dL (ref 6–20)
CHLORIDE: 104 mmol/L (ref 101–111)
CO2: 27 mmol/L (ref 22–32)
Calcium: 9.3 mg/dL (ref 8.9–10.3)
Creatinine, Ser: 0.84 mg/dL (ref 0.44–1.00)
Glucose, Bld: 123 mg/dL — ABNORMAL HIGH (ref 65–99)
POTASSIUM: 4.3 mmol/L (ref 3.5–5.1)
Sodium: 139 mmol/L (ref 135–145)
TOTAL PROTEIN: 7.5 g/dL (ref 6.5–8.1)
Total Bilirubin: 1.5 mg/dL — ABNORMAL HIGH (ref 0.3–1.2)

## 2015-04-30 LAB — I-STAT TROPONIN, ED
TROPONIN I, POC: 0 ng/mL (ref 0.00–0.08)
TROPONIN I, POC: 0 ng/mL (ref 0.00–0.08)

## 2015-04-30 LAB — PROTIME-INR
INR: 2.6 — AB (ref 0.00–1.49)
PROTHROMBIN TIME: 27.5 s — AB (ref 11.6–15.2)

## 2015-04-30 LAB — BRAIN NATRIURETIC PEPTIDE: B NATRIURETIC PEPTIDE 5: 490.1 pg/mL — AB (ref 0.0–100.0)

## 2015-04-30 MED ORDER — AZITHROMYCIN 250 MG PO TABS: 250.0000 mg | ORAL_TABLET | Freq: Every day | ORAL | Status: DC

## 2015-04-30 MED ORDER — PREDNISONE 10 MG PO TABS: 40.0000 mg | ORAL_TABLET | Freq: Every day | ORAL | Status: DC

## 2015-04-30 NOTE — ED Notes (Signed)
Pt is a&ox3, baseline orientation. Pt /family denied questions r/t dc instruction.

## 2015-04-30 NOTE — ED Provider Notes (Signed)
CSN: [id number]     Arrival date & time 04/30/15  1049 History   First MD Initiated Contact with Patient 04/30/15 1053     Chief Complaint  Patient presents with  . Shortness of Breath     (Consider location/radiation/quality/duration/timing/severity/associated sxs/prior Treatment)  HPI 81 year old female who presents with shortness of breath. History of atrial fibrillation on Coumadin and COPD. Poor historian and reports waking up this morning feeling short of breath and called EMS. On EMS arrival they noticed diffuse wheezing and was given nebulizing treatments and 125 mg of Solu-Medrol. Patient poor history, but reports maybe feeling short of breath off and on for past few weeks. Her son, who accompanies her today, reports that for the past 2 weeks she has had on-and-off difficulty breathing, which she has been taking her inhaler for. He reports that she had called him this morning because her inhalers did not seem to help her symptoms. Denies any chest pain, lower extremity edema, PND, but does difficulty lying flat at night, which is her baseline. Denies cough, fevers, chills, congestion, sore throat or runny nose. Has noticed more phelgm than more recently.   Past Medical History  Diagnosis Date  . Atrial fibrillation   . Hypothyroidism   . Glaucoma   . Neuropathy     PERIPHERAL  . Osteoporosis   . Osteoarthritis   . Fall   . Trimalleolar fracture     LEFT TRIMALLEOLAR FRACTURE, DISLOCATION WITH OBLIQUE FRACTURE OF THE DISTAL FIBULAR DIAPHYSIS, AND NOTED BEING MILDLY COMMINUTED  . COPD (chronic obstructive pulmonary disease)    Past Surgical History  Procedure Laterality Date  . Orif tibia & fibula fractures    . Vaginal hysterectomy     History reviewed. No pertinent family history. Social History  Substance Use Topics  . Smoking status: Never Smoker   . Smokeless tobacco: None  . Alcohol Use: No   OB History    No data available     Review of Systems 10/14  systems reviewed and are negative other than those stated in the HPI    Allergies  Review of patient's allergies indicates no known allergies.  Home Medications   Prior to Admission medications   Medication Sig Start Date End Date Taking? Authorizing Provider  acetaminophen (TYLENOL) 500 MG tablet Take 500 mg by mouth every 6 (six) hours as needed for mild pain.   Yes Historical Provider, MD  albuterol (PROVENTIL HFA;VENTOLIN HFA) 108 (90 BASE) MCG/ACT inhaler Inhale 1-2 puffs into the lungs every 6 (six) hours as needed for wheezing or shortness of breath.   Yes Historical Provider, MD  cyanocobalamin (,VITAMIN B-12,) 1000 MCG/ML injection Inject 1,000 mcg into the muscle every 30 (thirty) days. Pt gets this at her md office   Yes Historical Provider, MD  dorzolamide-timolol (COSOPT) 22.3-6.8 MG/ML ophthalmic solution Place 1 drop into the left eye daily.    Yes Historical Provider, MD  ergocalciferol (VITAMIN D2) 50000 UNITS capsule Take 50,000 Units by mouth once a week. thursday   Yes Historical Provider, MD  FLUoxetine (PROZAC) 20 MG tablet Take 20 mg by mouth daily.     Yes Historical Provider, MD  gabapentin (NEURONTIN) 600 MG tablet Take 600 mg by mouth at bedtime. 03/27/15  Yes Historical Provider, MD  levothyroxine (SYNTHROID, LEVOTHROID) 112 MCG tablet Take 112 mcg by mouth daily.     Yes Historical Provider, MD  methocarbamol (ROBAXIN) 750 MG tablet Take 750 mg by mouth at bedtime. 03/27/15  Yes Historical  Provider, MD  metoprolol tartrate (LOPRESSOR) 25 MG tablet Take 1 tablet (25 mg total) by mouth 2 (two) times daily. 12/29/10 04/30/15 Yes Peter M Martinique, MD  Multiple Vitamins-Minerals (EYE VITAMINS PO) Take by mouth daily.     Yes Historical Provider, MD  oxyCODONE-acetaminophen (PERCOCET/ROXICET) 5-325 MG per tablet Take 1-2 tablets by mouth every 4 (four) hours as needed for pain. Patient taking differently: Take 1-2 tablets by mouth every 4 (four) hours as needed.  05/24/12  Yes  John-Adam Bonk, MD  pilocarpine (PILOCAR) 4 % ophthalmic solution Place 1 drop into the left eye 2 (two) times daily.    Yes Historical Provider, MD  travoprost, benzalkonium, (TRAVATAN) 0.004 % ophthalmic solution Place 1 drop into the left eye at bedtime.    Yes Historical Provider, MD  warfarin (COUMADIN) 3 MG tablet Take 1.5-3 mg by mouth daily. Mon,wed,fri,sat take 3 mgs tues,thurs,sun 1.5mg s   Yes Historical Provider, MD  azithromycin (ZITHROMAX) 250 MG tablet Take 1 tablet (250 mg total) by mouth daily. Take first 2 tablets together, then 1 every day until finished. 04/30/15   Forde Dandy, MD  predniSONE (DELTASONE) 10 MG tablet Take 4 tablets (40 mg total) by mouth daily. 04/30/15   Forde Dandy, MD   BP 105/83 mmHg  Pulse 79  Temp(Src) 98 F (36.7 C) (Oral)  Resp 18  SpO2 95% Physical Exam Physical Exam  Nursing note and vitals reviewed. Constitutional: Thin, well developed, well nourished, non-toxic, and in no acute distress Head: Normocephalic and atraumatic.  Mouth/Throat: Oropharynx is clear and moist.  Neck: Normal range of motion. Neck supple. No appreciable JVD.  Cardiovascular: Normal rate and regular rhythm.  No lower extremity edema. Pulmonary/Chest: Effort normal. Coarse breath sounds at bases. No wheezing.  Abdominal: Soft. There is no tenderness. There is no rebound and no guarding.  Musculoskeletal: Normal range of motion.  Neurological: Alert, no facial droop, fluent speech, moves all extremities symmetrically Skin: Skin is warm and dry.  Psychiatric: Cooperative  ED Course  Procedures (including critical care time) Labs Review Labs Reviewed  CBC WITH DIFFERENTIAL/PLATELET - Abnormal; Notable for the following:    RBC 3.71 (*)    Hemoglobin 11.9 (*)    HCT 35.2 (*)    Neutrophils Relative % 92 (*)    Neutro Abs 8.0 (*)    Lymphocytes Relative 4 (*)    Lymphs Abs 0.4 (*)    All other components within normal limits  BRAIN NATRIURETIC PEPTIDE - Abnormal;  Notable for the following:    B Natriuretic Peptide 490.1 (*)    All other components within normal limits  PROTIME-INR - Abnormal; Notable for the following:    Prothrombin Time 27.5 (*)    INR 2.60 (*)    All other components within normal limits  COMPREHENSIVE METABOLIC PANEL - Abnormal; Notable for the following:    Glucose, Bld 123 (*)    ALT 12 (*)    Total Bilirubin 1.5 (*)    All other components within normal limits  I-STAT TROPOININ, ED  I-STAT TROPOININ, ED    Imaging Review Dg Chest 2 View  04/30/2015   CLINICAL DATA:  Shortness of breath upon awakening at 0500 hours today, no relief with inhaler, BILATERAL lower lobe wheezing, vomiting with vagal response, atrial fibrillation, COPD  EXAM: CHEST  2 VIEW  COMPARISON:  05/24/2012 Enlargement of cardiac silhouette.  FINDINGS: Enlargement of cardiac silhouette.  Kyphotic positioning.  Atherosclerotic calcification aorta.  Lungs appear emphysematous with  minimal bibasilar atelectasis.  No definite acute infiltrate, pleural effusion or pneumothorax.  Bones demineralized.  Old RIGHT rib fractures.  Marked compression fracture of a lower thoracic vertebra approximately T11 with additional compression fractures of mid thoracic vertebra, stable a T5 and new at T7.  IMPRESSION: COPD changes with minimal bibasilar atelectasis.  Osseous demineralization with multiple compression fractures of the thoracic spine as above, new at T7.   Electronically Signed   By: Lavonia Tiffnay Bossi M.D.   On: 04/30/2015 12:28   I have personally reviewed and evaluated these images and lab results as part of my medical decision-making.   EKG Interpretation   Date/Time:  Tuesday April 30 2015 11:01:00 EDT Ventricular Rate:  77 PR Interval:  70 QRS Duration: 84 QT Interval:  436 QTC Calculation: 493 R Axis:   20 Text Interpretation:  Partially atrially paced rhythm Atrial fibrillation  ST depression in lateral leads Confirmed by Chelci Wintermute MD, Analyse Angst ([id number]) on   04/30/2015 11:08:44 AM      MDM   Final diagnoses:  COPD with exacerbation    81 year old female with history of COPD and atrial fibrillation on Coumadin who presents with shortness of breath. She on presentation is nontoxic and in no acute distress. She is on room air, speaking in full sentences, with normal work of breathing and normal oxygenation. She reports that at time of arrival, her symptoms had resolved and that treatments with nebulizers had improved her symptoms en route by EMS. On my examination, she no longer has any wheezing that was initially noted by EMS. She does not appear fluid overloaded, and symptoms does not seem consistent with new onset CHF. No evidence of pulmonary edema on chest x-ray or infiltrate. EKG is nonischemic, and serial troponins and serial EKGs are unremarkable. The symptoms does not seem consistent with that of underlying ACS at this time. No concern for PE, as she is not tachycardia, tachypneic, hypoxic and symptoms fully resolved with COPD treatment. She is observed in the ED with no recurrent wheezing or shortness of breath. Likely COPD exacerbation and will be discharged home with a course of steroids and Z-Pak for treatment. Strict return and follow-up instructions are reviewed with the patient and her family. They express understanding of all discharge instructions and felt comfortable with the plan of care.    Forde Dandy, MD 04/30/15 [phone]

## 2015-04-30 NOTE — ED Notes (Signed)
Bed: WA09 Expected date:  Expected time:  Means of arrival:  Comments: EMS sob

## 2015-04-30 NOTE — ED Notes (Signed)
Pt is from home. She awoke at about 5 am this am with SOB, inhaler used without relief. Wheezes in BLL. Duo neb, 125 mg solumedrol given in field. C/O CP with respiration, 12 lead shows Afib/COPD, pt has hx Afib. Pt did have a vagal episode with vomiting, 4mg  Zofran given.

## 2015-04-30 NOTE — Discharge Instructions (Signed)
Return without fail for worsening symptoms including difficulty breathing, chest pain, fever, confusion, or any other symptoms concerning to you. Use your rescue inhaler every 4 hours schedule for first day. Then use as needed. Take medications as prescribed.  Chronic Obstructive Pulmonary Disease Chronic obstructive pulmonary disease (COPD) is a common lung problem. In COPD, the flow of air from the lungs is limited. The way your lungs work will probably never return to normal, but there are things you can do to improve your lungs and make yourself feel better. HOME CARE  Take all medicines as told by your doctor.  Avoid medicines or cough syrups that dry up your airway (such as antihistamines) and do not allow you to get rid of thick spit. You do not need to avoid them if told differently by your doctor.  If you smoke, stop. Smoking makes the problem worse.  Avoid being around things that make your breathing worse (like smoke, chemicals, and fumes).  Use oxygen therapy and therapy to help improve your lungs (pulmonary rehabilitation) if told by your doctor. If you need home oxygen therapy, ask your doctor if you should buy a tool to measure your oxygen level (oximeter).  Avoid people who have a sickness you can catch (contagious).  Avoid going outside when it is very hot, cold, or humid.  Eat healthy foods. Eat smaller meals more often. Rest before meals.  Stay active, but remember to also rest.  Make sure to get all the shots (vaccines) your doctor recommends. Ask your doctor if you need a pneumonia shot.  Learn and use tips on how to relax.  Learn and use tips on how to control your breathing as told by your doctor. Try:  Breathing in (inhaling) through your nose for 1 second. Then, pucker your lips and breath out (exhale) through your lips for 2 seconds.  Putting one hand on your belly (abdomen). Breathe in slowly through your nose for 1 second. Your hand on your belly should  move out. Pucker your lips and breathe out slowly through your lips. Your hand on your belly should move in as you breathe out.  Learn and use controlled coughing to clear thick spit from your lungs. The steps are: 1. Lean your head a little forward. 2. Breathe in deeply. 3. Try to hold your breath for 3 seconds. 4. Keep your mouth slightly open while coughing 2 times. 5. Spit any thick spit out into a tissue. 6. Rest and do the steps again 1 or 2 times as needed. GET HELP IF:  You cough up more thick spit than usual.  There is a change in the color or thickness of the spit.  It is harder to breathe than usual.  Your breathing is faster than usual. GET HELP RIGHT AWAY IF:   You have shortness of breath while resting.  You have shortness of breath that stops you from:  Being able to talk.  Doing normal activities.  You chest hurts for longer than 5 minutes.  Your skin color is more blue than usual.  Your pulse oximeter shows that you have low oxygen for longer than 5 minutes. MAKE SURE YOU:   Understand these instructions.  Will watch your condition.  Will get help right away if you are not doing well or get worse. Document Released: 01/20/2008 Document Revised: 12/18/2013 Document Reviewed: 03/30/2013 [facility] Patient Information 2015 [place], [place]. This information is not intended to replace advice given to you by your health care provider.  Make sure you discuss any questions you have with your health care provider. ° °

## 2015-04-30 NOTE — ED Notes (Signed)
Pt not able to walk, per norm, due to neuropathy

## 2015-05-03 DIAGNOSIS — R791 Abnormal coagulation profile: Secondary | ICD-10-CM | POA: Diagnosis not present

## 2015-05-03 DIAGNOSIS — J441 Chronic obstructive pulmonary disease with (acute) exacerbation: Secondary | ICD-10-CM | POA: Diagnosis not present

## 2015-05-06 DIAGNOSIS — H4011X3 Primary open-angle glaucoma, severe stage: Secondary | ICD-10-CM | POA: Diagnosis not present

## 2015-05-06 DIAGNOSIS — H52203 Unspecified astigmatism, bilateral: Secondary | ICD-10-CM | POA: Diagnosis not present

## 2015-05-06 DIAGNOSIS — H3531 Nonexudative age-related macular degeneration: Secondary | ICD-10-CM | POA: Diagnosis not present

## 2015-05-06 DIAGNOSIS — H524 Presbyopia: Secondary | ICD-10-CM | POA: Diagnosis not present

## 2015-05-07 ENCOUNTER — Emergency Department (HOSPITAL_COMMUNITY): Payer: Medicare Other

## 2015-05-07 ENCOUNTER — Emergency Department (HOSPITAL_COMMUNITY)
Admission: EM | Admit: 2015-05-07 | Discharge: 2015-05-07 | Disposition: A | Discharge status: Home / Self Care | Payer: Medicare Other | Attending: Emergency Medicine | Admitting: Emergency Medicine

## 2015-05-07 ENCOUNTER — Encounter (HOSPITAL_COMMUNITY): Payer: Self-pay | Admitting: Emergency Medicine

## 2015-05-07 DIAGNOSIS — Z9181 History of falling: Secondary | ICD-10-CM | POA: Insufficient documentation

## 2015-05-07 DIAGNOSIS — R404 Transient alteration of awareness: Secondary | ICD-10-CM | POA: Diagnosis not present

## 2015-05-07 DIAGNOSIS — E039 Hypothyroidism, unspecified: Secondary | ICD-10-CM | POA: Insufficient documentation

## 2015-05-07 DIAGNOSIS — I4891 Unspecified atrial fibrillation: Secondary | ICD-10-CM | POA: Insufficient documentation

## 2015-05-07 DIAGNOSIS — J449 Chronic obstructive pulmonary disease, unspecified: Secondary | ICD-10-CM | POA: Diagnosis not present

## 2015-05-07 DIAGNOSIS — Z79899 Other long term (current) drug therapy: Secondary | ICD-10-CM | POA: Insufficient documentation

## 2015-05-07 DIAGNOSIS — H409 Unspecified glaucoma: Secondary | ICD-10-CM | POA: Diagnosis not present

## 2015-05-07 DIAGNOSIS — Z7901 Long term (current) use of anticoagulants: Secondary | ICD-10-CM | POA: Diagnosis not present

## 2015-05-07 DIAGNOSIS — R531 Weakness: Secondary | ICD-10-CM | POA: Diagnosis not present

## 2015-05-07 DIAGNOSIS — M1990 Unspecified osteoarthritis, unspecified site: Secondary | ICD-10-CM | POA: Insufficient documentation

## 2015-05-07 DIAGNOSIS — G629 Polyneuropathy, unspecified: Secondary | ICD-10-CM | POA: Insufficient documentation

## 2015-05-07 DIAGNOSIS — J441 Chronic obstructive pulmonary disease with (acute) exacerbation: Secondary | ICD-10-CM | POA: Insufficient documentation

## 2015-05-07 DIAGNOSIS — R0602 Shortness of breath: Secondary | ICD-10-CM | POA: Diagnosis not present

## 2015-05-07 LAB — PROTIME-INR
INR: 2.64 — ABNORMAL HIGH (ref 0.00–1.49)
Prothrombin Time: 27.8 seconds — ABNORMAL HIGH (ref 11.6–15.2)

## 2015-05-07 LAB — COMPREHENSIVE METABOLIC PANEL
ALK PHOS: 44 U/L (ref 38–126)
ALT: 15 U/L (ref 14–54)
ANION GAP: 8 (ref 5–15)
AST: 20 U/L (ref 15–41)
Albumin: 3.7 g/dL (ref 3.5–5.0)
BUN: 18 mg/dL (ref 6–20)
CALCIUM: 9.1 mg/dL (ref 8.9–10.3)
CO2: 25 mmol/L (ref 22–32)
Chloride: 102 mmol/L (ref 101–111)
Creatinine, Ser: 0.98 mg/dL (ref 0.44–1.00)
GFR, EST AFRICAN AMERICAN: 59 mL/min — AB (ref >60)
GFR, EST NON AFRICAN AMERICAN: 51 mL/min — AB (ref >60)
Glucose, Bld: 127 mg/dL — ABNORMAL HIGH (ref 65–99)
Potassium: 3.4 mmol/L — ABNORMAL LOW (ref 3.5–5.1)
SODIUM: 135 mmol/L (ref 135–145)
Total Bilirubin: 1.5 mg/dL — ABNORMAL HIGH (ref 0.3–1.2)
Total Protein: 6.8 g/dL (ref 6.5–8.1)

## 2015-05-07 LAB — CBC WITH DIFFERENTIAL/PLATELET
Basophils Absolute: 0 10*3/uL (ref 0.0–0.1)
Basophils Relative: 0 %
EOS ABS: 0 10*3/uL (ref 0.0–0.7)
EOS PCT: 0 %
HCT: 36.6 % (ref 36.0–46.0)
HEMOGLOBIN: 12.5 g/dL (ref 12.0–15.0)
LYMPHS ABS: 0.6 10*3/uL — AB (ref 0.7–4.0)
Lymphocytes Relative: 7 %
MCH: 31.9 pg (ref 26.0–34.0)
MCHC: 34.2 g/dL (ref 30.0–36.0)
MCV: 93.4 fL (ref 78.0–100.0)
MONOS PCT: 9 %
Monocytes Absolute: 0.9 10*3/uL (ref 0.1–1.0)
Neutro Abs: 8 10*3/uL — ABNORMAL HIGH (ref 1.7–7.7)
Neutrophils Relative %: 84 %
PLATELETS: 251 10*3/uL (ref 150–400)
RBC: 3.92 MIL/uL (ref 3.87–5.11)
RDW: 12.9 % (ref 11.5–15.5)
WBC: 9.5 10*3/uL (ref 4.0–10.5)

## 2015-05-07 LAB — I-STAT TROPONIN, ED: TROPONIN I, POC: 0.01 ng/mL (ref 0.00–0.08)

## 2015-05-07 MED ORDER — SODIUM CHLORIDE 0.9 % IV BOLUS (SEPSIS)
1000.0000 mL | Freq: Once | INTRAVENOUS | Status: AC
  Administered 2015-05-07: 1000 mL via INTRAVENOUS

## 2015-05-07 MED ORDER — ALBUTEROL (5 MG/ML) CONTINUOUS INHALATION SOLN
10.0000 mg/h | INHALATION_SOLUTION | Freq: Once | RESPIRATORY_TRACT | Status: AC
  Administered 2015-05-07: 10 mg/h via RESPIRATORY_TRACT
  Filled 2015-05-07: qty 20

## 2015-05-07 MED ORDER — METHYLPREDNISOLONE SODIUM SUCC 125 MG IJ SOLR
125.0000 mg | Freq: Once | INTRAMUSCULAR | Status: AC
  Administered 2015-05-07: 125 mg via INTRAVENOUS
  Filled 2015-05-07: qty 2

## 2015-05-07 MED ORDER — PREDNISONE 20 MG PO TABS: ORAL_TABLET | ORAL | Status: DC

## 2015-05-07 MED ORDER — ALBUTEROL SULFATE (2.5 MG/3ML) 0.083% IN NEBU
5.0000 mg | INHALATION_SOLUTION | Freq: Once | RESPIRATORY_TRACT | Status: AC
Start: 2015-05-07 — End: 2015-05-07
  Administered 2015-05-07: 5 mg via RESPIRATORY_TRACT
  Filled 2015-05-07: qty 6

## 2015-05-07 NOTE — ED Notes (Signed)
Bed: WA07 Expected date:  Expected time:  Means of arrival:  Comments: EMS gen weakness x 1 week

## 2015-05-07 NOTE — Discharge Instructions (Signed)
Take prednisone as prescribed for 6 days.   Use albuterol 4-6 times daily for 2 days then as needed.  Stay hydrated.   See your doctor.  Return to ER if you have trouble breathing, worse wheezing, fever, weakness.

## 2015-05-07 NOTE — ED Notes (Signed)
Patient states that she is having trouble breathing. Patient went to dr office this AM for a breathing treatment. Patient says it got better but now she is having trouble breathing.

## 2015-05-07 NOTE — ED Provider Notes (Signed)
CSN: [id number]     Arrival date & time 05/07/15  2013 History   First MD Initiated Contact with Patient 05/07/15 2100     Chief Complaint  Patient presents with  . Shortness of Breath    Patient states that she is having trouble breathing. Patient went to dr office this AM for a breathing treatment. Patient says it got better but now she is having trouble breathing.     (Consider location/radiation/quality/duration/timing/severity/associated sxs/prior Treatment) The history is provided by the patient.  [name] is a 81 y.o. female hx of afib on coumadin, hypothyroidism, COPD here with shortness of breath. Patient has been having shortness of breath since yesterday. She has been coughing as well.  Also has generalized weakness. Denies any fevers. She went to see her primary care doctor and was given one nebulizer treatment. Patient went home afterwards was supposed to get another nebulizer but was too weak and has some shortness of breath so came here for evaluation. Of note patient was seen in the ED about a week ago and finished 3 days of prednisone, Zpack. Hx of COPD, former smoker.    Past Medical History  Diagnosis Date  . Atrial fibrillation   . Hypothyroidism   . Glaucoma   . Neuropathy     PERIPHERAL  . Osteoporosis   . Osteoarthritis   . Fall   . Trimalleolar fracture     LEFT TRIMALLEOLAR FRACTURE, DISLOCATION WITH OBLIQUE FRACTURE OF THE DISTAL FIBULAR DIAPHYSIS, AND NOTED BEING MILDLY COMMINUTED  . COPD (chronic obstructive pulmonary disease)    Past Surgical History  Procedure Laterality Date  . Orif tibia & fibula fractures    . Vaginal hysterectomy     History reviewed. No pertinent family history. Social History  Substance Use Topics  . Smoking status: Never Smoker   . Smokeless tobacco: None  . Alcohol Use: No   OB History    No data available     Review of Systems  Respiratory: Positive for shortness of breath.   All other systems reviewed and  are negative.     Allergies  Review of patient's allergies indicates no known allergies.  Home Medications   Prior to Admission medications   Medication Sig Start Date End Date Taking? Authorizing Provider  acetaminophen (TYLENOL) 500 MG tablet Take 500 mg by mouth every 6 (six) hours as needed for mild pain.   Yes Historical Provider, MD  albuterol (PROVENTIL HFA;VENTOLIN HFA) 108 (90 BASE) MCG/ACT inhaler Inhale 1-2 puffs into the lungs every 6 (six) hours as needed for wheezing or shortness of breath.   Yes Historical Provider, MD  cyanocobalamin (,VITAMIN B-12,) 1000 MCG/ML injection Inject 1,000 mcg into the muscle every 30 (thirty) days. Pt gets this at her md office   Yes Historical Provider, MD  dorzolamide-timolol (COSOPT) 22.3-6.8 MG/ML ophthalmic solution Place 1 drop into the left eye daily.    Yes Historical Provider, MD  ergocalciferol (VITAMIN D2) 50000 UNITS capsule Take 50,000 Units by mouth once a week. thursday   Yes Historical Provider, MD  FLUoxetine (PROZAC) 20 MG tablet Take 20 mg by mouth daily.     Yes Historical Provider, MD  gabapentin (NEURONTIN) 600 MG tablet Take 600 mg by mouth at bedtime. 03/27/15  Yes Historical Provider, MD  levothyroxine (SYNTHROID, LEVOTHROID) 112 MCG tablet Take 112 mcg by mouth daily.     Yes Historical Provider, MD  methocarbamol (ROBAXIN) 750 MG tablet Take 750 mg by mouth at bedtime.  03/27/15  Yes Historical Provider, MD  Multiple Vitamins-Minerals (EYE VITAMINS PO) Take by mouth daily.     Yes Historical Provider, MD  oxyCODONE-acetaminophen (PERCOCET/ROXICET) 5-325 MG per tablet Take 1-2 tablets by mouth every 4 (four) hours as needed for pain. Patient taking differently: Take 1-2 tablets by mouth every 4 (four) hours as needed.  05/24/12  Yes John-Adam Bonk, MD  pilocarpine (PILOCAR) 4 % ophthalmic solution Place 1 drop into the left eye 2 (two) times daily.    Yes Historical Provider, MD  travoprost, benzalkonium, (TRAVATAN) 0.004 %  ophthalmic solution Place 1 drop into the left eye at bedtime.    Yes Historical Provider, MD  warfarin (COUMADIN) 3 MG tablet Take 1.5-3 mg by mouth daily. Mon,wed,fri,sat take 3 mgs tues,thurs,sun 1.5mg s   Yes Historical Provider, MD  azithromycin (ZITHROMAX) 250 MG tablet Take 1 tablet (250 mg total) by mouth daily. Take first 2 tablets together, then 1 every day until finished. Patient not taking: Reported on 05/07/2015 04/30/15   Forde Dandy, MD  metoprolol tartrate (LOPRESSOR) 25 MG tablet Take 1 tablet (25 mg total) by mouth 2 (two) times daily. 12/29/10 04/30/15  Peter M Martinique, MD  predniSONE (DELTASONE) 10 MG tablet Take 4 tablets (40 mg total) by mouth daily. Patient not taking: Reported on 05/07/2015 04/30/15   Forde Dandy, MD   BP 126/56 mmHg  Pulse 71  Temp(Src) 98.4 F (36.9 C) (Oral)  Resp 24  Ht 5\' 7"  (1.702 m)  Wt 138 lb (62.596 kg)  BMI 21.61 kg/m2  SpO2 100% Physical Exam  Constitutional: She is oriented to person, place, and time.  Chronically ill   HENT:  Head: Normocephalic.  Mouth/Throat: Oropharynx is clear and moist.  Eyes: Conjunctivae are normal. Pupils are equal, round, and reactive to light.  Neck: Normal range of motion. Neck supple.  Cardiovascular: Normal rate, regular rhythm and normal heart sounds.   Pulmonary/Chest:  Diminished throughout. Mild diffuse wheezing. No retractions   Abdominal: Soft. Bowel sounds are normal. She exhibits no distension. There is no tenderness. There is no rebound.  Musculoskeletal: Normal range of motion. She exhibits no edema or tenderness.  Neurological: She is alert and oriented to person, place, and time.  Skin: Skin is warm and dry.  Psychiatric: She has a normal mood and affect. Her behavior is normal. Judgment and thought content normal.  Nursing note and vitals reviewed.   ED Course  Procedures (including critical care time) Labs Review Labs Reviewed  CBC WITH DIFFERENTIAL/PLATELET - Abnormal; Notable for the  following:    Neutro Abs 8.0 (*)    Lymphs Abs 0.6 (*)    All other components within normal limits  COMPREHENSIVE METABOLIC PANEL - Abnormal; Notable for the following:    Potassium 3.4 (*)    Glucose, Bld 127 (*)    Total Bilirubin 1.5 (*)    GFR calc non Af Amer 51 (*)    GFR calc Af Amer 59 (*)    All other components within normal limits  PROTIME-INR - Abnormal; Notable for the following:    Prothrombin Time 27.8 (*)    INR 2.64 (*)    All other components within normal limits  I-STAT TROPOININ, ED    Imaging Review Dg Chest 2 View  05/07/2015   CLINICAL DATA:  Shortness of breath and weakness. History of COPD and atrial fibrillation.  EXAM: CHEST  2 VIEW  COMPARISON:  04/30/2015  FINDINGS: The heart is enlarged. Aorta is tortuous.  Patient is rotated. There are no focal consolidations or pleural effusions. Mild bronchitic changes are present and probably chronic. Hiatal hernia is present. Stable wedge compression fractures noted in the thoracic spine.  IMPRESSION: 1. Cardiomegaly. 2.  No evidence for acute pulmonary abnormality.   Electronically Signed   By: Nolon Nations M.D.   On: 05/07/2015 21:43   I have personally reviewed and evaluated these images and lab results as part of my medical decision-making.   EKG Interpretation   Date/Time:  Tuesday May 07 2015 20:23:04 EDT Ventricular Rate:  63 PR Interval:    QRS Duration: 93 QT Interval:  492 QTC Calculation: 504 R Axis:   15 Text Interpretation:  Atrial fibrillation Borderline repolarization  abnormality Prolonged QT interval No significant change since last tracing  Confirmed by YAO  MD, DAVID ([id number]) on 05/07/2015 9:04:42 PM      MDM   Final diagnoses:  None    [name] is a 81 y.o. female here with cough, wheezing, shortness of breath. On coumadin for afib and INR 2.6 so I doubt PE. Likely COPD vs pneumonia. Will get CXR, labs, give continuous nebs and IV steroids and reassess.   11:18  PM Labs at baseline. INR 2.6. CXR showed no pneumonia. Felt better now. Never hypoxic. Increased breath sounds now, minimal wheezing. Will dc home with longer course of steroids.     Wandra Arthurs, MD 05/07/15 [phone]

## 2015-05-20 DIAGNOSIS — J40 Bronchitis, not specified as acute or chronic: Secondary | ICD-10-CM | POA: Diagnosis not present

## 2015-05-20 DIAGNOSIS — G894 Chronic pain syndrome: Secondary | ICD-10-CM | POA: Diagnosis not present

## 2015-05-20 DIAGNOSIS — D511 Vitamin B12 deficiency anemia due to selective vitamin B12 malabsorption with proteinuria: Secondary | ICD-10-CM | POA: Diagnosis not present

## 2015-05-27 DIAGNOSIS — Z7901 Long term (current) use of anticoagulants: Secondary | ICD-10-CM | POA: Diagnosis not present

## 2015-05-27 DIAGNOSIS — J189 Pneumonia, unspecified organism: Secondary | ICD-10-CM | POA: Diagnosis not present

## 2015-06-03 DIAGNOSIS — G894 Chronic pain syndrome: Secondary | ICD-10-CM | POA: Diagnosis not present

## 2015-06-03 DIAGNOSIS — F410 Panic disorder [episodic paroxysmal anxiety] without agoraphobia: Secondary | ICD-10-CM | POA: Diagnosis not present

## 2015-06-03 DIAGNOSIS — E039 Hypothyroidism, unspecified: Secondary | ICD-10-CM | POA: Diagnosis not present

## 2015-06-03 DIAGNOSIS — J449 Chronic obstructive pulmonary disease, unspecified: Secondary | ICD-10-CM | POA: Diagnosis not present

## 2015-06-03 DIAGNOSIS — F17200 Nicotine dependence, unspecified, uncomplicated: Secondary | ICD-10-CM | POA: Diagnosis not present

## 2015-06-05 DIAGNOSIS — J449 Chronic obstructive pulmonary disease, unspecified: Secondary | ICD-10-CM | POA: Diagnosis not present

## 2015-06-06 DIAGNOSIS — Z961 Presence of intraocular lens: Secondary | ICD-10-CM | POA: Diagnosis not present

## 2015-06-06 DIAGNOSIS — H401133 Primary open-angle glaucoma, bilateral, severe stage: Secondary | ICD-10-CM | POA: Diagnosis not present

## 2015-06-06 DIAGNOSIS — H353112 Nonexudative age-related macular degeneration, right eye, intermediate dry stage: Secondary | ICD-10-CM | POA: Diagnosis not present

## 2015-06-06 DIAGNOSIS — H353123 Nonexudative age-related macular degeneration, left eye, advanced atrophic without subfoveal involvement: Secondary | ICD-10-CM | POA: Diagnosis not present

## 2015-06-17 DIAGNOSIS — Z23 Encounter for immunization: Secondary | ICD-10-CM | POA: Diagnosis not present

## 2015-06-17 DIAGNOSIS — J449 Chronic obstructive pulmonary disease, unspecified: Secondary | ICD-10-CM | POA: Diagnosis not present

## 2015-06-17 DIAGNOSIS — I4891 Unspecified atrial fibrillation: Secondary | ICD-10-CM | POA: Diagnosis not present

## 2015-06-17 DIAGNOSIS — G629 Polyneuropathy, unspecified: Secondary | ICD-10-CM | POA: Diagnosis not present

## 2015-06-17 DIAGNOSIS — R269 Unspecified abnormalities of gait and mobility: Secondary | ICD-10-CM | POA: Diagnosis not present

## 2015-06-17 DIAGNOSIS — S90819A Abrasion, unspecified foot, initial encounter: Secondary | ICD-10-CM | POA: Diagnosis not present

## 2015-06-27 DIAGNOSIS — R791 Abnormal coagulation profile: Secondary | ICD-10-CM | POA: Diagnosis not present

## 2015-07-01 DIAGNOSIS — R791 Abnormal coagulation profile: Secondary | ICD-10-CM | POA: Diagnosis not present

## 2015-07-08 DIAGNOSIS — Z7901 Long term (current) use of anticoagulants: Secondary | ICD-10-CM | POA: Diagnosis not present

## 2015-07-17 DIAGNOSIS — R0602 Shortness of breath: Secondary | ICD-10-CM | POA: Diagnosis not present

## 2015-07-17 DIAGNOSIS — L97509 Non-pressure chronic ulcer of other part of unspecified foot with unspecified severity: Secondary | ICD-10-CM | POA: Diagnosis not present

## 2015-07-17 DIAGNOSIS — J432 Centrilobular emphysema: Secondary | ICD-10-CM | POA: Diagnosis not present

## 2015-07-17 DIAGNOSIS — R791 Abnormal coagulation profile: Secondary | ICD-10-CM | POA: Diagnosis not present

## 2015-07-17 DIAGNOSIS — I482 Chronic atrial fibrillation: Secondary | ICD-10-CM | POA: Diagnosis not present

## 2015-07-24 DIAGNOSIS — R0602 Shortness of breath: Secondary | ICD-10-CM | POA: Diagnosis not present

## 2015-07-24 DIAGNOSIS — I4891 Unspecified atrial fibrillation: Secondary | ICD-10-CM | POA: Diagnosis not present

## 2015-08-01 ENCOUNTER — Emergency Department (HOSPITAL_COMMUNITY)
Admission: EM | Admit: 2015-08-01 | Discharge: 2015-08-01 | Disposition: A | Discharge status: Home / Self Care | Payer: Medicare Other | Attending: Emergency Medicine | Admitting: Emergency Medicine

## 2015-08-01 ENCOUNTER — Emergency Department (HOSPITAL_COMMUNITY): Payer: Medicare Other

## 2015-08-01 ENCOUNTER — Encounter (HOSPITAL_COMMUNITY): Payer: Self-pay | Admitting: Emergency Medicine

## 2015-08-01 DIAGNOSIS — M810 Age-related osteoporosis without current pathological fracture: Secondary | ICD-10-CM | POA: Diagnosis not present

## 2015-08-01 DIAGNOSIS — W010XXA Fall on same level from slipping, tripping and stumbling without subsequent striking against object, initial encounter: Secondary | ICD-10-CM | POA: Diagnosis not present

## 2015-08-01 DIAGNOSIS — M1990 Unspecified osteoarthritis, unspecified site: Secondary | ICD-10-CM | POA: Insufficient documentation

## 2015-08-01 DIAGNOSIS — Z8679 Personal history of other diseases of the circulatory system: Secondary | ICD-10-CM | POA: Insufficient documentation

## 2015-08-01 DIAGNOSIS — S82141A Displaced bicondylar fracture of right tibia, initial encounter for closed fracture: Secondary | ICD-10-CM

## 2015-08-01 DIAGNOSIS — S8991XA Unspecified injury of right lower leg, initial encounter: Secondary | ICD-10-CM | POA: Diagnosis present

## 2015-08-01 DIAGNOSIS — Y9389 Activity, other specified: Secondary | ICD-10-CM | POA: Insufficient documentation

## 2015-08-01 DIAGNOSIS — Z7901 Long term (current) use of anticoagulants: Secondary | ICD-10-CM | POA: Diagnosis not present

## 2015-08-01 DIAGNOSIS — Y998 Other external cause status: Secondary | ICD-10-CM | POA: Diagnosis not present

## 2015-08-01 DIAGNOSIS — H409 Unspecified glaucoma: Secondary | ICD-10-CM | POA: Insufficient documentation

## 2015-08-01 DIAGNOSIS — Y92009 Unspecified place in unspecified non-institutional (private) residence as the place of occurrence of the external cause: Secondary | ICD-10-CM | POA: Insufficient documentation

## 2015-08-01 DIAGNOSIS — Z79899 Other long term (current) drug therapy: Secondary | ICD-10-CM | POA: Diagnosis not present

## 2015-08-01 DIAGNOSIS — R52 Pain, unspecified: Secondary | ICD-10-CM | POA: Diagnosis not present

## 2015-08-01 DIAGNOSIS — M79604 Pain in right leg: Secondary | ICD-10-CM | POA: Diagnosis not present

## 2015-08-01 DIAGNOSIS — S82234A Nondisplaced oblique fracture of shaft of right tibia, initial encounter for closed fracture: Secondary | ICD-10-CM | POA: Diagnosis not present

## 2015-08-01 DIAGNOSIS — J449 Chronic obstructive pulmonary disease, unspecified: Secondary | ICD-10-CM | POA: Insufficient documentation

## 2015-08-01 DIAGNOSIS — E039 Hypothyroidism, unspecified: Secondary | ICD-10-CM | POA: Insufficient documentation

## 2015-08-01 DIAGNOSIS — S82191A Other fracture of upper end of right tibia, initial encounter for closed fracture: Secondary | ICD-10-CM | POA: Diagnosis not present

## 2015-08-01 MED ORDER — HYDROCODONE-ACETAMINOPHEN 5-325 MG PO TABS: 1.0000 | ORAL_TABLET | Freq: Four times a day (QID) | ORAL | Status: DC | PRN

## 2015-08-01 MED ORDER — HYDROCODONE-ACETAMINOPHEN 5-325 MG PO TABS
1.0000 | ORAL_TABLET | Freq: Once | ORAL | Status: AC
  Administered 2015-08-01: 1 via ORAL
  Filled 2015-08-01: qty 1

## 2015-08-01 NOTE — ED Notes (Signed)
Bed: WA15 Expected date:  Expected time:  Means of arrival:  Comments: fall 

## 2015-08-01 NOTE — Care Management Note (Signed)
Case Management Note  Patient Details  Name: [name] MRN: [id number] Date of Birth: [birth year]  Subjective/Objective:  Patient presents to ED past fall sustaining fracture to right tibia.  Knee immobilizer applied.  EDCM consulted to speak to patient and family about home health services.              Action/Plan:  EDCM spoke to patient and family at bedside.  Provided patient's daughter [name] [phone] home health agency list and private duty nursing list.  [facility] explained that private duty nursing services would be an out of pocket expense.  Patient and family agreeable to home health services and chose [facility] as they have used this agency in the past.  Patient has a bedside commode, walker, lift chair, shower bench and electric wheelchair at home.  Patient's daughter reports patient does not use the walker at home because the patient is unable to stand per her baseline.  Patient's daughter reports patient receives 24 hour care at home between family members.  EDCM assessed for further dme needs at this time.  No further dme needs per family.  Discussed patient with EDP who has placed home health orders for RN, PT, aide and Education officer, museum.  [facility] faxed home health orders to [facility] with confirmation of receipt.  No further EDCM needs at this time.   Expected Discharge Date:                  Expected Discharge Plan:  [place]  In-House Referral:     Discharge planning Services  CM Consult  Post Acute Care Choice:  Home Health Choice offered to:  Patient, Adult Children  DME Arranged:    DME Agency:  [place] Arranged:  RN, PT, OT, Nurse's Aide, Social Work [employer] Agency:     Status of Service:  Completed, signed off  Medicare Important Message Given:    Date Medicare IM Given:    Medicare IM give by:    Date Additional Medicare IM Given:    Additional Medicare Important Message give by:     If discussed at [place] of Stay Meetings, dates  discussed:    Additional CommentsLivia Snellen, RN 08/01/2015, 9:45 PM

## 2015-08-01 NOTE — ED Notes (Signed)
Patient presents from home via EMS for fall. Fell when transferring self from toilet to w/c. No LOC, no anticoagulants, pain to right leg 8/10. No deformities, no swelling.   Last VS: 144/74, 74hr, 96%ra.

## 2015-08-01 NOTE — Discharge Instructions (Signed)
Nondisplaced Tibial Plateau Fracture °A tibial plateau fracture is a break in the bone that forms the bottom of your knee joint (tibia or shin bone). The lower end of your thigh bone (femur) forms the upper surface of your knee joint. The top of the tibia has a flat, smooth surface (tibial plateau). This part of the shin bone is made of softer bone than the shaft of the shin bone. If a strong force shoves your femur down into your tibial plateau, the tibial plateau can collapse or break away at the edges. This is also called an intra-articular fracture. °A nondisplaced fracture means that the broken piece or pieces of your tibial plateau have not moved out of their normal position. This type of fracture can usually be treated without surgery. You will need to wear a brace or a splint and avoid using your knee to support your body weight while the fracture is healing. °CAUSES °Common causes of this type of fracture include: °· Car accidents. °· Jumps or falls from a significant height. °· Injuries from high-energy sports or contact sports. °RISK FACTORS °You may be at higher risk for this type of fracture if: °· You play high-energy sports or contact sports. °· You have a history of bone infections. °· You are an older person with a condition that causes weak bones (osteoporosis). °SIGNS AND SYMPTOMS °Signs and symptoms of a nondisplaced tibial plateau fracture begin immediately after the injury. They may include: °· Pain that is worse when you use your knee to support your body weight. °· Swelling of your knee. °· Bruising around your knee. °DIAGNOSIS °Your health care provider may suspect a nondisplaced tibial plateau fracture based on your signs and symptoms, especially if you had a recent injury. Your health care provider will also do a physical exam. This may include imaging tests such as: °· X-ray of your knee. This is used to confirm the diagnosis. °· CT scan or MRI. This checks to make sure that no bones have  moved out of place and that there are no other injuries to your knee. °TREATMENT °Treatment for a nondisplaced tibial plateau fracture may involve wearing a brace or a splint to keep your leg in one position while it heals. You may also need to use crutches, a scooter, a walker, or a wheelchair so you can move around without using your injured leg to support your body weight. You may also be prescribed pain medicine. °While your knee is healing, you may see a physical therapist. Your physical therapist will move your knee without putting weight on it (passive exercise). This helps to keep your knee from becoming stiff. After your leg has healed, your health care provider will show you how to do range-of-motion exercises to strengthen your knee muscles and prevent stiffness. °HOME CARE INSTRUCTIONS °If You Have a Brace or a Splint: °· Wear it as directed by your health care provider. Remove it only as directed by your health care provider. °· Loosen the brace or splint if your toes become numb and tingle, or if they turn cold and blue. °Bathing °· Cover the brace or splint with a watertight plastic bag to protect it from water while you bathe or shower. Do not let the brace or splint get wet. °Managing Pain, Stiffness, and Swelling °· If directed, apply ice to the injured area: °¨ Put ice in a plastic bag. °¨ Place a towel between your skin and the bag. °¨ Leave the ice on for 20   minutes, 2-3 times a day. °· Move your toes and ankle often to avoid stiffness and to lessen swelling. °· Raise the injured area above the level of your heart while you are sitting or lying down. °Driving °· Do not drive or operate heavy machinery while taking pain medicine. °· Do not drive while wearing a brace or a splint on a leg that you use for driving. °Activity °· Return to your normal activities as directed by your health care provider. Ask your health care provider what activities are safe for you. °· Perform range-of-motion  exercises only as directed by your health care provider. °Safety °· Do not use the injured limb to support your body weight until your health care provider says that you can. Use crutches, a scooter, a walker, or a wheelchair as directed by your health care provider. °General Instructions °· Keep the brace or splint clean and dry. °· Do not use any tobacco products, including cigarettes, chewing tobacco, or electronic cigarettes. Tobacco can delay bone healing. If you need help quitting, ask your health care provider. °· Take medicines only as directed by your health care provider. °· Keep all follow-up visits as directed by your health care provider. This is important. °SEEK MEDICAL CARE IF: °· Your pain is getting worse. °· Your pain medicine is not helping. °SEEK IMMEDIATE MEDICAL CARE IF: °· You have very bad pain in your injured leg. °· You have swelling or redness in your foot that is getting worse. °· You begin to lose feeling in your foot or your toes. °· Your foot or your toes are cold or turn blue. °  °This information is not intended to replace advice given to you by your health care provider. Make sure you discuss any questions you have with your health care provider. °  °Document Released: 05/13/2005 Document Revised: 08/24/2014 Document Reviewed: 03/21/2014 °Elsevier Interactive Patient Education ©2016 [employer] ° °

## 2015-08-02 NOTE — ED Provider Notes (Signed)
CSN: [id number]     Arrival date & time 08/01/15  1842 History   First MD Initiated Contact with Patient 08/01/15 1844     No chief complaint on file.    (Consider location/radiation/quality/duration/timing/severity/associated sxs/prior Treatment) Patient is a 81 y.o. female presenting with knee pain. The history is provided by the patient and a relative.  Knee Pain Location:  Knee Time since incident:  30 minutes Injury: yes   Mechanism of injury: fall   Fall:    Fall occurred:  Tripped   Entrapped after fall: no   Knee location:  R knee Pain details:    Quality:  Aching   Radiates to:  Does not radiate   Severity:  Moderate   Onset quality:  Sudden   Timing:  Constant   Progression:  Unchanged Chronicity:  New Prior injury to area:  No Relieved by:  Nothing Worsened by:  Nothing tried Ineffective treatments:  None tried Associated symptoms: decreased ROM and swelling   Associated symptoms: no numbness     Past Medical History  Diagnosis Date  . Atrial fibrillation ([place])   . Hypothyroidism   . Glaucoma   . Neuropathy ([facility])     PERIPHERAL  . Osteoporosis   . Osteoarthritis   . Fall   . Trimalleolar fracture     LEFT TRIMALLEOLAR FRACTURE, DISLOCATION WITH OBLIQUE FRACTURE OF THE DISTAL FIBULAR DIAPHYSIS, AND NOTED BEING MILDLY COMMINUTED  . COPD (chronic obstructive pulmonary disease) [facility])    Past Surgical History  Procedure Laterality Date  . Orif tibia & fibula fractures    . Vaginal hysterectomy     No family history on file. Social History  Substance Use Topics  . Smoking status: Never Smoker   . Smokeless tobacco: None  . Alcohol Use: No   OB History    No data available     Review of Systems  All other systems reviewed and are negative.     Allergies  Review of patient's allergies indicates no known allergies.  Home Medications   Prior to Admission medications   Medication Sig Start Date End Date Taking? Authorizing Provider   acetaminophen (TYLENOL) 500 MG tablet Take 500 mg by mouth every 6 (six) hours as needed for mild pain.    Historical Provider, MD  albuterol (PROVENTIL HFA;VENTOLIN HFA) 108 (90 BASE) MCG/ACT inhaler Inhale 1-2 puffs into the lungs every 6 (six) hours as needed for wheezing or shortness of breath.    Historical Provider, MD  azithromycin (ZITHROMAX) 250 MG tablet Take 1 tablet (250 mg total) by mouth daily. Take first 2 tablets together, then 1 every day until finished. Patient not taking: Reported on 05/07/2015 04/30/15   Forde Dandy, MD  cyanocobalamin (,VITAMIN B-12,) 1000 MCG/ML injection Inject 1,000 mcg into the muscle every 30 (thirty) days. Pt gets this at her md office    Historical Provider, MD  dorzolamide-timolol (COSOPT) 22.3-6.8 MG/ML ophthalmic solution Place 1 drop into the left eye daily.     Historical Provider, MD  ergocalciferol (VITAMIN D2) 50000 UNITS capsule Take 50,000 Units by mouth once a week. thursday    Historical Provider, MD  FLUoxetine (PROZAC) 20 MG tablet Take 20 mg by mouth daily.      Historical Provider, MD  gabapentin (NEURONTIN) 600 MG tablet Take 600 mg by mouth at bedtime. 03/27/15   Historical Provider, MD  HYDROcodone-acetaminophen (NORCO/VICODIN) 5-325 MG tablet Take 1 tablet by mouth every 6 (six) hours as needed for severe pain.  08/01/15   Leo Grosser, MD  levothyroxine (SYNTHROID, LEVOTHROID) 112 MCG tablet Take 112 mcg by mouth daily.      Historical Provider, MD  methocarbamol (ROBAXIN) 750 MG tablet Take 750 mg by mouth at bedtime. 03/27/15   Historical Provider, MD  metoprolol tartrate (LOPRESSOR) 25 MG tablet Take 1 tablet (25 mg total) by mouth 2 (two) times daily. 12/29/10 04/30/15  Peter M Martinique, MD  Multiple Vitamins-Minerals (EYE VITAMINS PO) Take by mouth daily.      Historical Provider, MD  oxyCODONE-acetaminophen (PERCOCET/ROXICET) 5-325 MG per tablet Take 1-2 tablets by mouth every 4 (four) hours as needed for pain. Patient taking  differently: Take 1-2 tablets by mouth every 4 (four) hours as needed.  05/24/12   John-Adam Bonk, MD  pilocarpine (PILOCAR) 4 % ophthalmic solution Place 1 drop into the left eye 2 (two) times daily.     Historical Provider, MD  predniSONE (DELTASONE) 20 MG tablet Take 60 mg daily x 2 days then 40 mg daily x 2 days then 20 mg daily x 2 days 05/07/15   Wandra Arthurs, MD  travoprost, benzalkonium, (TRAVATAN) 0.004 % ophthalmic solution Place 1 drop into the left eye at bedtime.     Historical Provider, MD  warfarin (COUMADIN) 3 MG tablet Take 1.5-3 mg by mouth daily. Mon,wed,fri,sat take 3 mgs tues,thurs,sun 1.5mg s    Historical Provider, MD   BP 165/81 mmHg  Pulse 78  Temp(Src) 98.1 F (36.7 C) (Oral)  Resp 23  SpO2 95% Physical Exam  Constitutional: She is oriented to person, place, and time. She appears well-developed and well-nourished. No distress.  HENT:  Head: Normocephalic.  Eyes: Conjunctivae are normal.  Neck: Neck supple. No tracheal deviation present.  Cardiovascular: Normal rate and regular rhythm.   Pulmonary/Chest: Effort normal. No respiratory distress.  Abdominal: Soft. She exhibits no distension.  Musculoskeletal:       Right knee: She exhibits decreased range of motion, swelling and effusion. She exhibits no deformity, normal alignment, no LCL laxity and no MCL laxity. Tenderness (anterior tibia) found.  Neurological: She is alert and oriented to person, place, and time.  Skin: Skin is warm and dry.  Psychiatric: She has a normal mood and affect.    ED Course  Procedures (including critical care time)  SPLINT APPLICATION Date/Time: 0000000 PM Authorized by: Leo Grosser Consent: Verbal consent obtained. Risks and benefits: risks, benefits and alternatives were discussed Consent given by: patient Splint applied by: orthopedic technician Location details: right knee Splint type: knee immobilizer Supplies used: splint Post-procedure: The splinted body part was  neurovascularly unchanged following the procedure. Patient tolerance: Patient tolerated the procedure well with no immediate complications.     Labs Review Labs Reviewed - No data to display  Imaging Review Dg Knee Complete 4 Views Right  08/01/2015  CLINICAL DATA:  Right anterior knee pain and swelling after a fall. EXAM: RIGHT KNEE - COMPLETE 4+ VIEW COMPARISON:  None. FINDINGS: There is an oblique fracture of the right tibial metaphysis with fracture line beginning at the lateral margin of the distal aspect of the tibial metaphysis and extending in an oblique fraction through the tibial metaphysis superiorly and to the central aspect of the medial tibial plateau at the base of the tibial spine. There is no significant displacement of the fracture fragments. There is a moderate size right knee effusion. The distal femur, patella, and fibula appear intact. IMPRESSION: Nondisplaced acute oblique linear fracture through the right proximal tibial metaphysis and  extending to the medial tibial plateau at at the tibial spine. Electronically Signed   By: Lucienne Capers M.D.   On: 08/01/2015 19:27   I have personally reviewed and evaluated these images and lab results as part of my medical decision-making.   EKG Interpretation None      MDM   Final diagnoses:  Fracture, tibial plateau, right, closed, initial encounter    81 y.o. female presents with fall from standing height to ground attempting transfer. Pt non-ambulatory at baseline. Sustained tibia fracture involving plateau that is nondisplaced. Placed in knee immobilizer. NWB although Pt is nonambulatory at baseline. Son and daughter who care for Pt in turns agree with follow up, pain control, and plan for home health visit to help with mobility and care going forward.     Leo Grosser, MD 08/02/15 [phone]

## 2015-08-03 DIAGNOSIS — G4710 Hypersomnia, unspecified: Secondary | ICD-10-CM | POA: Diagnosis not present

## 2015-08-03 DIAGNOSIS — J449 Chronic obstructive pulmonary disease, unspecified: Secondary | ICD-10-CM | POA: Diagnosis not present

## 2015-08-03 DIAGNOSIS — M810 Age-related osteoporosis without current pathological fracture: Secondary | ICD-10-CM | POA: Diagnosis not present

## 2015-08-03 DIAGNOSIS — R5383 Other fatigue: Secondary | ICD-10-CM | POA: Diagnosis not present

## 2015-08-03 DIAGNOSIS — Z9181 History of falling: Secondary | ICD-10-CM | POA: Diagnosis not present

## 2015-08-03 DIAGNOSIS — R32 Unspecified urinary incontinence: Secondary | ICD-10-CM | POA: Diagnosis not present

## 2015-08-03 DIAGNOSIS — I5033 Acute on chronic diastolic (congestive) heart failure: Secondary | ICD-10-CM | POA: Diagnosis not present

## 2015-08-03 DIAGNOSIS — J9811 Atelectasis: Secondary | ICD-10-CM | POA: Diagnosis not present

## 2015-08-03 DIAGNOSIS — M25561 Pain in right knee: Secondary | ICD-10-CM | POA: Diagnosis not present

## 2015-08-03 DIAGNOSIS — G629 Polyneuropathy, unspecified: Secondary | ICD-10-CM | POA: Diagnosis not present

## 2015-08-03 DIAGNOSIS — M6281 Muscle weakness (generalized): Secondary | ICD-10-CM | POA: Diagnosis not present

## 2015-08-03 DIAGNOSIS — J9601 Acute respiratory failure with hypoxia: Secondary | ICD-10-CM | POA: Diagnosis not present

## 2015-08-03 DIAGNOSIS — S82141D Displaced bicondylar fracture of right tibia, subsequent encounter for closed fracture with routine healing: Secondary | ICD-10-CM | POA: Diagnosis not present

## 2015-08-03 DIAGNOSIS — Z993 Dependence on wheelchair: Secondary | ICD-10-CM | POA: Diagnosis not present

## 2015-08-03 DIAGNOSIS — R4182 Altered mental status, unspecified: Secondary | ICD-10-CM | POA: Diagnosis not present

## 2015-08-03 DIAGNOSIS — R0602 Shortness of breath: Secondary | ICD-10-CM | POA: Diagnosis not present

## 2015-08-03 DIAGNOSIS — I4891 Unspecified atrial fibrillation: Secondary | ICD-10-CM | POA: Diagnosis not present

## 2015-08-03 DIAGNOSIS — S9031XD Contusion of right foot, subsequent encounter: Secondary | ICD-10-CM | POA: Diagnosis not present

## 2015-08-03 DIAGNOSIS — Z7901 Long term (current) use of anticoagulants: Secondary | ICD-10-CM | POA: Diagnosis not present

## 2015-08-03 DIAGNOSIS — E876 Hypokalemia: Secondary | ICD-10-CM | POA: Diagnosis not present

## 2015-08-03 DIAGNOSIS — I110 Hypertensive heart disease with heart failure: Secondary | ICD-10-CM | POA: Diagnosis not present

## 2015-08-03 DIAGNOSIS — R600 Localized edema: Secondary | ICD-10-CM | POA: Diagnosis not present

## 2015-08-03 DIAGNOSIS — G9340 Encephalopathy, unspecified: Secondary | ICD-10-CM | POA: Diagnosis not present

## 2015-08-03 DIAGNOSIS — R791 Abnormal coagulation profile: Secondary | ICD-10-CM | POA: Diagnosis not present

## 2015-08-03 DIAGNOSIS — H3530 Unspecified macular degeneration: Secondary | ICD-10-CM | POA: Diagnosis not present

## 2015-08-04 DIAGNOSIS — S82254A Nondisplaced comminuted fracture of shaft of right tibia, initial encounter for closed fracture: Secondary | ICD-10-CM | POA: Diagnosis not present

## 2015-08-04 DIAGNOSIS — I517 Cardiomegaly: Secondary | ICD-10-CM | POA: Diagnosis not present

## 2015-08-04 DIAGNOSIS — K222 Esophageal obstruction: Secondary | ICD-10-CM | POA: Diagnosis present

## 2015-08-04 DIAGNOSIS — I482 Chronic atrial fibrillation: Secondary | ICD-10-CM | POA: Diagnosis not present

## 2015-08-04 DIAGNOSIS — R1310 Dysphagia, unspecified: Secondary | ICD-10-CM | POA: Diagnosis not present

## 2015-08-04 DIAGNOSIS — J449 Chronic obstructive pulmonary disease, unspecified: Secondary | ICD-10-CM | POA: Diagnosis present

## 2015-08-04 DIAGNOSIS — Z6823 Body mass index (BMI) 23.0-23.9, adult: Secondary | ICD-10-CM | POA: Diagnosis not present

## 2015-08-04 DIAGNOSIS — Z79899 Other long term (current) drug therapy: Secondary | ICD-10-CM | POA: Diagnosis not present

## 2015-08-04 DIAGNOSIS — S82121A Displaced fracture of lateral condyle of right tibia, initial encounter for closed fracture: Secondary | ICD-10-CM | POA: Diagnosis not present

## 2015-08-04 DIAGNOSIS — I083 Combined rheumatic disorders of mitral, aortic and tricuspid valves: Secondary | ICD-10-CM | POA: Diagnosis not present

## 2015-08-04 DIAGNOSIS — R9430 Abnormal result of cardiovascular function study, unspecified: Secondary | ICD-10-CM | POA: Diagnosis not present

## 2015-08-04 DIAGNOSIS — S82141A Displaced bicondylar fracture of right tibia, initial encounter for closed fracture: Secondary | ICD-10-CM | POA: Diagnosis not present

## 2015-08-04 DIAGNOSIS — I5020 Unspecified systolic (congestive) heart failure: Secondary | ICD-10-CM | POA: Diagnosis not present

## 2015-08-04 DIAGNOSIS — I509 Heart failure, unspecified: Secondary | ICD-10-CM | POA: Diagnosis not present

## 2015-08-04 DIAGNOSIS — R5383 Other fatigue: Secondary | ICD-10-CM | POA: Diagnosis not present

## 2015-08-04 DIAGNOSIS — G8929 Other chronic pain: Secondary | ICD-10-CM | POA: Diagnosis present

## 2015-08-04 DIAGNOSIS — E876 Hypokalemia: Secondary | ICD-10-CM | POA: Diagnosis present

## 2015-08-04 DIAGNOSIS — I110 Hypertensive heart disease with heart failure: Secondary | ICD-10-CM | POA: Diagnosis present

## 2015-08-04 DIAGNOSIS — D130 Benign neoplasm of esophagus: Secondary | ICD-10-CM | POA: Diagnosis not present

## 2015-08-04 DIAGNOSIS — Z7901 Long term (current) use of anticoagulants: Secondary | ICD-10-CM | POA: Diagnosis not present

## 2015-08-04 DIAGNOSIS — S82101A Unspecified fracture of upper end of right tibia, initial encounter for closed fracture: Secondary | ICD-10-CM | POA: Diagnosis not present

## 2015-08-04 DIAGNOSIS — J9690 Respiratory failure, unspecified, unspecified whether with hypoxia or hypercapnia: Secondary | ICD-10-CM | POA: Diagnosis not present

## 2015-08-04 DIAGNOSIS — I5033 Acute on chronic diastolic (congestive) heart failure: Secondary | ICD-10-CM | POA: Diagnosis present

## 2015-08-04 DIAGNOSIS — M1990 Unspecified osteoarthritis, unspecified site: Secondary | ICD-10-CM | POA: Diagnosis present

## 2015-08-04 DIAGNOSIS — S82141D Displaced bicondylar fracture of right tibia, subsequent encounter for closed fracture with routine healing: Secondary | ICD-10-CM | POA: Diagnosis not present

## 2015-08-04 DIAGNOSIS — R0602 Shortness of breath: Secondary | ICD-10-CM | POA: Diagnosis not present

## 2015-08-04 DIAGNOSIS — R791 Abnormal coagulation profile: Secondary | ICD-10-CM | POA: Diagnosis not present

## 2015-08-04 DIAGNOSIS — M810 Age-related osteoporosis without current pathological fracture: Secondary | ICD-10-CM | POA: Diagnosis present

## 2015-08-04 DIAGNOSIS — J9601 Acute respiratory failure with hypoxia: Secondary | ICD-10-CM | POA: Diagnosis not present

## 2015-08-04 DIAGNOSIS — M79671 Pain in right foot: Secondary | ICD-10-CM | POA: Diagnosis not present

## 2015-08-04 DIAGNOSIS — R931 Abnormal findings on diagnostic imaging of heart and coronary circulation: Secondary | ICD-10-CM | POA: Diagnosis not present

## 2015-08-04 DIAGNOSIS — J9811 Atelectasis: Secondary | ICD-10-CM | POA: Diagnosis present

## 2015-08-04 DIAGNOSIS — E039 Hypothyroidism, unspecified: Secondary | ICD-10-CM | POA: Diagnosis not present

## 2015-08-08 DIAGNOSIS — M6281 Muscle weakness (generalized): Secondary | ICD-10-CM | POA: Diagnosis not present

## 2015-08-08 DIAGNOSIS — R4182 Altered mental status, unspecified: Secondary | ICD-10-CM | POA: Diagnosis not present

## 2015-08-08 DIAGNOSIS — G629 Polyneuropathy, unspecified: Secondary | ICD-10-CM | POA: Diagnosis not present

## 2015-08-08 DIAGNOSIS — S9031XD Contusion of right foot, subsequent encounter: Secondary | ICD-10-CM | POA: Diagnosis not present

## 2015-08-08 DIAGNOSIS — S82141D Displaced bicondylar fracture of right tibia, subsequent encounter for closed fracture with routine healing: Secondary | ICD-10-CM | POA: Diagnosis not present

## 2015-08-08 DIAGNOSIS — M25561 Pain in right knee: Secondary | ICD-10-CM | POA: Diagnosis not present

## 2015-08-09 DIAGNOSIS — R4182 Altered mental status, unspecified: Secondary | ICD-10-CM | POA: Diagnosis not present

## 2015-08-09 DIAGNOSIS — S9031XD Contusion of right foot, subsequent encounter: Secondary | ICD-10-CM | POA: Diagnosis not present

## 2015-08-09 DIAGNOSIS — S82141D Displaced bicondylar fracture of right tibia, subsequent encounter for closed fracture with routine healing: Secondary | ICD-10-CM | POA: Diagnosis not present

## 2015-08-09 DIAGNOSIS — G629 Polyneuropathy, unspecified: Secondary | ICD-10-CM | POA: Diagnosis not present

## 2015-08-09 DIAGNOSIS — M6281 Muscle weakness (generalized): Secondary | ICD-10-CM | POA: Diagnosis not present

## 2015-08-09 DIAGNOSIS — M25561 Pain in right knee: Secondary | ICD-10-CM | POA: Diagnosis not present

## 2015-08-12 DIAGNOSIS — G629 Polyneuropathy, unspecified: Secondary | ICD-10-CM | POA: Diagnosis not present

## 2015-08-12 DIAGNOSIS — M6281 Muscle weakness (generalized): Secondary | ICD-10-CM | POA: Diagnosis not present

## 2015-08-12 DIAGNOSIS — M25561 Pain in right knee: Secondary | ICD-10-CM | POA: Diagnosis not present

## 2015-08-12 DIAGNOSIS — S82141D Displaced bicondylar fracture of right tibia, subsequent encounter for closed fracture with routine healing: Secondary | ICD-10-CM | POA: Diagnosis not present

## 2015-08-12 DIAGNOSIS — S9031XD Contusion of right foot, subsequent encounter: Secondary | ICD-10-CM | POA: Diagnosis not present

## 2015-08-12 DIAGNOSIS — R4182 Altered mental status, unspecified: Secondary | ICD-10-CM | POA: Diagnosis not present

## 2015-08-13 DIAGNOSIS — R4182 Altered mental status, unspecified: Secondary | ICD-10-CM | POA: Diagnosis not present

## 2015-08-13 DIAGNOSIS — S82141D Displaced bicondylar fracture of right tibia, subsequent encounter for closed fracture with routine healing: Secondary | ICD-10-CM | POA: Diagnosis not present

## 2015-08-13 DIAGNOSIS — M25561 Pain in right knee: Secondary | ICD-10-CM | POA: Diagnosis not present

## 2015-08-13 DIAGNOSIS — G629 Polyneuropathy, unspecified: Secondary | ICD-10-CM | POA: Diagnosis not present

## 2015-08-13 DIAGNOSIS — S9031XD Contusion of right foot, subsequent encounter: Secondary | ICD-10-CM | POA: Diagnosis not present

## 2015-08-13 DIAGNOSIS — M6281 Muscle weakness (generalized): Secondary | ICD-10-CM | POA: Diagnosis not present

## 2015-08-15 DIAGNOSIS — G629 Polyneuropathy, unspecified: Secondary | ICD-10-CM | POA: Diagnosis not present

## 2015-08-15 DIAGNOSIS — S9031XD Contusion of right foot, subsequent encounter: Secondary | ICD-10-CM | POA: Diagnosis not present

## 2015-08-15 DIAGNOSIS — R4182 Altered mental status, unspecified: Secondary | ICD-10-CM | POA: Diagnosis not present

## 2015-08-15 DIAGNOSIS — M25561 Pain in right knee: Secondary | ICD-10-CM | POA: Diagnosis not present

## 2015-08-15 DIAGNOSIS — S82141D Displaced bicondylar fracture of right tibia, subsequent encounter for closed fracture with routine healing: Secondary | ICD-10-CM | POA: Diagnosis not present

## 2015-08-15 DIAGNOSIS — M6281 Muscle weakness (generalized): Secondary | ICD-10-CM | POA: Diagnosis not present

## 2015-08-16 DIAGNOSIS — S9031XD Contusion of right foot, subsequent encounter: Secondary | ICD-10-CM | POA: Diagnosis not present

## 2015-08-16 DIAGNOSIS — M6281 Muscle weakness (generalized): Secondary | ICD-10-CM | POA: Diagnosis not present

## 2015-08-16 DIAGNOSIS — G629 Polyneuropathy, unspecified: Secondary | ICD-10-CM | POA: Diagnosis not present

## 2015-08-16 DIAGNOSIS — R4182 Altered mental status, unspecified: Secondary | ICD-10-CM | POA: Diagnosis not present

## 2015-08-16 DIAGNOSIS — M25561 Pain in right knee: Secondary | ICD-10-CM | POA: Diagnosis not present

## 2015-08-16 DIAGNOSIS — S82141D Displaced bicondylar fracture of right tibia, subsequent encounter for closed fracture with routine healing: Secondary | ICD-10-CM | POA: Diagnosis not present

## 2015-08-17 DIAGNOSIS — M6281 Muscle weakness (generalized): Secondary | ICD-10-CM | POA: Diagnosis not present

## 2015-08-17 DIAGNOSIS — S9031XD Contusion of right foot, subsequent encounter: Secondary | ICD-10-CM | POA: Diagnosis not present

## 2015-08-17 DIAGNOSIS — M25561 Pain in right knee: Secondary | ICD-10-CM | POA: Diagnosis not present

## 2015-08-17 DIAGNOSIS — S82141D Displaced bicondylar fracture of right tibia, subsequent encounter for closed fracture with routine healing: Secondary | ICD-10-CM | POA: Diagnosis not present

## 2015-08-17 DIAGNOSIS — R4182 Altered mental status, unspecified: Secondary | ICD-10-CM | POA: Diagnosis not present

## 2015-08-17 DIAGNOSIS — G629 Polyneuropathy, unspecified: Secondary | ICD-10-CM | POA: Diagnosis not present

## 2015-08-19 DIAGNOSIS — S9031XD Contusion of right foot, subsequent encounter: Secondary | ICD-10-CM | POA: Diagnosis not present

## 2015-08-19 DIAGNOSIS — M25561 Pain in right knee: Secondary | ICD-10-CM | POA: Diagnosis not present

## 2015-08-19 DIAGNOSIS — S82141D Displaced bicondylar fracture of right tibia, subsequent encounter for closed fracture with routine healing: Secondary | ICD-10-CM | POA: Diagnosis not present

## 2015-08-19 DIAGNOSIS — R4182 Altered mental status, unspecified: Secondary | ICD-10-CM | POA: Diagnosis not present

## 2015-08-19 DIAGNOSIS — M6281 Muscle weakness (generalized): Secondary | ICD-10-CM | POA: Diagnosis not present

## 2015-08-19 DIAGNOSIS — G629 Polyneuropathy, unspecified: Secondary | ICD-10-CM | POA: Diagnosis not present

## 2015-08-20 DIAGNOSIS — S9031XD Contusion of right foot, subsequent encounter: Secondary | ICD-10-CM | POA: Diagnosis not present

## 2015-08-20 DIAGNOSIS — S82141D Displaced bicondylar fracture of right tibia, subsequent encounter for closed fracture with routine healing: Secondary | ICD-10-CM | POA: Diagnosis not present

## 2015-08-20 DIAGNOSIS — M6281 Muscle weakness (generalized): Secondary | ICD-10-CM | POA: Diagnosis not present

## 2015-08-20 DIAGNOSIS — M25561 Pain in right knee: Secondary | ICD-10-CM | POA: Diagnosis not present

## 2015-08-20 DIAGNOSIS — G629 Polyneuropathy, unspecified: Secondary | ICD-10-CM | POA: Diagnosis not present

## 2015-08-20 DIAGNOSIS — R4182 Altered mental status, unspecified: Secondary | ICD-10-CM | POA: Diagnosis not present

## 2015-08-21 DIAGNOSIS — M25561 Pain in right knee: Secondary | ICD-10-CM | POA: Diagnosis not present

## 2015-08-21 DIAGNOSIS — R4182 Altered mental status, unspecified: Secondary | ICD-10-CM | POA: Diagnosis not present

## 2015-08-21 DIAGNOSIS — S82141D Displaced bicondylar fracture of right tibia, subsequent encounter for closed fracture with routine healing: Secondary | ICD-10-CM | POA: Diagnosis not present

## 2015-08-21 DIAGNOSIS — M6281 Muscle weakness (generalized): Secondary | ICD-10-CM | POA: Diagnosis not present

## 2015-08-21 DIAGNOSIS — S9031XD Contusion of right foot, subsequent encounter: Secondary | ICD-10-CM | POA: Diagnosis not present

## 2015-08-21 DIAGNOSIS — G629 Polyneuropathy, unspecified: Secondary | ICD-10-CM | POA: Diagnosis not present

## 2015-08-23 DIAGNOSIS — M25561 Pain in right knee: Secondary | ICD-10-CM | POA: Diagnosis not present

## 2015-08-23 DIAGNOSIS — S82141D Displaced bicondylar fracture of right tibia, subsequent encounter for closed fracture with routine healing: Secondary | ICD-10-CM | POA: Diagnosis not present

## 2015-08-23 DIAGNOSIS — S9031XD Contusion of right foot, subsequent encounter: Secondary | ICD-10-CM | POA: Diagnosis not present

## 2015-08-23 DIAGNOSIS — M6281 Muscle weakness (generalized): Secondary | ICD-10-CM | POA: Diagnosis not present

## 2015-08-23 DIAGNOSIS — G629 Polyneuropathy, unspecified: Secondary | ICD-10-CM | POA: Diagnosis not present

## 2015-08-23 DIAGNOSIS — R4182 Altered mental status, unspecified: Secondary | ICD-10-CM | POA: Diagnosis not present

## 2015-08-26 DIAGNOSIS — S9031XD Contusion of right foot, subsequent encounter: Secondary | ICD-10-CM | POA: Diagnosis not present

## 2015-08-26 DIAGNOSIS — S82141D Displaced bicondylar fracture of right tibia, subsequent encounter for closed fracture with routine healing: Secondary | ICD-10-CM | POA: Diagnosis not present

## 2015-08-26 DIAGNOSIS — R4182 Altered mental status, unspecified: Secondary | ICD-10-CM | POA: Diagnosis not present

## 2015-08-26 DIAGNOSIS — M6281 Muscle weakness (generalized): Secondary | ICD-10-CM | POA: Diagnosis not present

## 2015-08-26 DIAGNOSIS — G629 Polyneuropathy, unspecified: Secondary | ICD-10-CM | POA: Diagnosis not present

## 2015-08-26 DIAGNOSIS — M25561 Pain in right knee: Secondary | ICD-10-CM | POA: Diagnosis not present

## 2015-08-28 DIAGNOSIS — M25561 Pain in right knee: Secondary | ICD-10-CM | POA: Diagnosis not present

## 2015-08-28 DIAGNOSIS — S9031XD Contusion of right foot, subsequent encounter: Secondary | ICD-10-CM | POA: Diagnosis not present

## 2015-08-28 DIAGNOSIS — G629 Polyneuropathy, unspecified: Secondary | ICD-10-CM | POA: Diagnosis not present

## 2015-08-28 DIAGNOSIS — R4182 Altered mental status, unspecified: Secondary | ICD-10-CM | POA: Diagnosis not present

## 2015-08-28 DIAGNOSIS — S82141D Displaced bicondylar fracture of right tibia, subsequent encounter for closed fracture with routine healing: Secondary | ICD-10-CM | POA: Diagnosis not present

## 2015-08-28 DIAGNOSIS — M6281 Muscle weakness (generalized): Secondary | ICD-10-CM | POA: Diagnosis not present

## 2015-08-30 DIAGNOSIS — R4182 Altered mental status, unspecified: Secondary | ICD-10-CM | POA: Diagnosis not present

## 2015-08-30 DIAGNOSIS — G629 Polyneuropathy, unspecified: Secondary | ICD-10-CM | POA: Diagnosis not present

## 2015-08-30 DIAGNOSIS — S82141D Displaced bicondylar fracture of right tibia, subsequent encounter for closed fracture with routine healing: Secondary | ICD-10-CM | POA: Diagnosis not present

## 2015-08-30 DIAGNOSIS — M6281 Muscle weakness (generalized): Secondary | ICD-10-CM | POA: Diagnosis not present

## 2015-08-30 DIAGNOSIS — S9031XD Contusion of right foot, subsequent encounter: Secondary | ICD-10-CM | POA: Diagnosis not present

## 2015-08-30 DIAGNOSIS — M25561 Pain in right knee: Secondary | ICD-10-CM | POA: Diagnosis not present

## 2015-09-02 DIAGNOSIS — M6281 Muscle weakness (generalized): Secondary | ICD-10-CM | POA: Diagnosis not present

## 2015-09-02 DIAGNOSIS — S82141D Displaced bicondylar fracture of right tibia, subsequent encounter for closed fracture with routine healing: Secondary | ICD-10-CM | POA: Diagnosis not present

## 2015-09-02 DIAGNOSIS — R4182 Altered mental status, unspecified: Secondary | ICD-10-CM | POA: Diagnosis not present

## 2015-09-02 DIAGNOSIS — S9031XD Contusion of right foot, subsequent encounter: Secondary | ICD-10-CM | POA: Diagnosis not present

## 2015-09-02 DIAGNOSIS — G629 Polyneuropathy, unspecified: Secondary | ICD-10-CM | POA: Diagnosis not present

## 2015-09-02 DIAGNOSIS — M25561 Pain in right knee: Secondary | ICD-10-CM | POA: Diagnosis not present

## 2015-09-03 DIAGNOSIS — R4182 Altered mental status, unspecified: Secondary | ICD-10-CM | POA: Diagnosis not present

## 2015-09-03 DIAGNOSIS — G629 Polyneuropathy, unspecified: Secondary | ICD-10-CM | POA: Diagnosis not present

## 2015-09-03 DIAGNOSIS — S9031XD Contusion of right foot, subsequent encounter: Secondary | ICD-10-CM | POA: Diagnosis not present

## 2015-09-03 DIAGNOSIS — M6281 Muscle weakness (generalized): Secondary | ICD-10-CM | POA: Diagnosis not present

## 2015-09-03 DIAGNOSIS — S82141D Displaced bicondylar fracture of right tibia, subsequent encounter for closed fracture with routine healing: Secondary | ICD-10-CM | POA: Diagnosis not present

## 2015-09-03 DIAGNOSIS — M25561 Pain in right knee: Secondary | ICD-10-CM | POA: Diagnosis not present

## 2015-09-04 DIAGNOSIS — G629 Polyneuropathy, unspecified: Secondary | ICD-10-CM | POA: Diagnosis not present

## 2015-09-04 DIAGNOSIS — S82141D Displaced bicondylar fracture of right tibia, subsequent encounter for closed fracture with routine healing: Secondary | ICD-10-CM | POA: Diagnosis not present

## 2015-09-04 DIAGNOSIS — M25561 Pain in right knee: Secondary | ICD-10-CM | POA: Diagnosis not present

## 2015-09-04 DIAGNOSIS — M6281 Muscle weakness (generalized): Secondary | ICD-10-CM | POA: Diagnosis not present

## 2015-09-04 DIAGNOSIS — S9031XD Contusion of right foot, subsequent encounter: Secondary | ICD-10-CM | POA: Diagnosis not present

## 2015-09-04 DIAGNOSIS — R4182 Altered mental status, unspecified: Secondary | ICD-10-CM | POA: Diagnosis not present

## 2015-09-05 DIAGNOSIS — M6281 Muscle weakness (generalized): Secondary | ICD-10-CM | POA: Diagnosis not present

## 2015-09-05 DIAGNOSIS — M25561 Pain in right knee: Secondary | ICD-10-CM | POA: Diagnosis not present

## 2015-09-05 DIAGNOSIS — R4182 Altered mental status, unspecified: Secondary | ICD-10-CM | POA: Diagnosis not present

## 2015-09-05 DIAGNOSIS — S82141D Displaced bicondylar fracture of right tibia, subsequent encounter for closed fracture with routine healing: Secondary | ICD-10-CM | POA: Diagnosis not present

## 2015-09-05 DIAGNOSIS — S9031XD Contusion of right foot, subsequent encounter: Secondary | ICD-10-CM | POA: Diagnosis not present

## 2015-09-05 DIAGNOSIS — G629 Polyneuropathy, unspecified: Secondary | ICD-10-CM | POA: Diagnosis not present

## 2015-09-06 DIAGNOSIS — R4182 Altered mental status, unspecified: Secondary | ICD-10-CM | POA: Diagnosis not present

## 2015-09-06 DIAGNOSIS — G629 Polyneuropathy, unspecified: Secondary | ICD-10-CM | POA: Diagnosis not present

## 2015-09-06 DIAGNOSIS — M25561 Pain in right knee: Secondary | ICD-10-CM | POA: Diagnosis not present

## 2015-09-06 DIAGNOSIS — M6281 Muscle weakness (generalized): Secondary | ICD-10-CM | POA: Diagnosis not present

## 2015-09-06 DIAGNOSIS — S9031XD Contusion of right foot, subsequent encounter: Secondary | ICD-10-CM | POA: Diagnosis not present

## 2015-09-06 DIAGNOSIS — S82141D Displaced bicondylar fracture of right tibia, subsequent encounter for closed fracture with routine healing: Secondary | ICD-10-CM | POA: Diagnosis not present

## 2015-09-09 DIAGNOSIS — M6281 Muscle weakness (generalized): Secondary | ICD-10-CM | POA: Diagnosis not present

## 2015-09-09 DIAGNOSIS — S9031XD Contusion of right foot, subsequent encounter: Secondary | ICD-10-CM | POA: Diagnosis not present

## 2015-09-09 DIAGNOSIS — M25561 Pain in right knee: Secondary | ICD-10-CM | POA: Diagnosis not present

## 2015-09-09 DIAGNOSIS — S82141D Displaced bicondylar fracture of right tibia, subsequent encounter for closed fracture with routine healing: Secondary | ICD-10-CM | POA: Diagnosis not present

## 2015-09-09 DIAGNOSIS — G629 Polyneuropathy, unspecified: Secondary | ICD-10-CM | POA: Diagnosis not present

## 2015-09-09 DIAGNOSIS — R4182 Altered mental status, unspecified: Secondary | ICD-10-CM | POA: Diagnosis not present

## 2015-09-11 DIAGNOSIS — R4182 Altered mental status, unspecified: Secondary | ICD-10-CM | POA: Diagnosis not present

## 2015-09-11 DIAGNOSIS — M6281 Muscle weakness (generalized): Secondary | ICD-10-CM | POA: Diagnosis not present

## 2015-09-11 DIAGNOSIS — G629 Polyneuropathy, unspecified: Secondary | ICD-10-CM | POA: Diagnosis not present

## 2015-09-11 DIAGNOSIS — M25561 Pain in right knee: Secondary | ICD-10-CM | POA: Diagnosis not present

## 2015-09-11 DIAGNOSIS — S82141D Displaced bicondylar fracture of right tibia, subsequent encounter for closed fracture with routine healing: Secondary | ICD-10-CM | POA: Diagnosis not present

## 2015-09-11 DIAGNOSIS — S9031XD Contusion of right foot, subsequent encounter: Secondary | ICD-10-CM | POA: Diagnosis not present

## 2015-09-11 DIAGNOSIS — M79661 Pain in right lower leg: Secondary | ICD-10-CM | POA: Diagnosis not present

## 2015-09-12 DIAGNOSIS — M6281 Muscle weakness (generalized): Secondary | ICD-10-CM | POA: Diagnosis not present

## 2015-09-12 DIAGNOSIS — R0602 Shortness of breath: Secondary | ICD-10-CM | POA: Diagnosis not present

## 2015-09-12 DIAGNOSIS — M25561 Pain in right knee: Secondary | ICD-10-CM | POA: Diagnosis not present

## 2015-09-12 DIAGNOSIS — S9031XD Contusion of right foot, subsequent encounter: Secondary | ICD-10-CM | POA: Diagnosis not present

## 2015-09-12 DIAGNOSIS — R4182 Altered mental status, unspecified: Secondary | ICD-10-CM | POA: Diagnosis not present

## 2015-09-12 DIAGNOSIS — I482 Chronic atrial fibrillation: Secondary | ICD-10-CM | POA: Diagnosis not present

## 2015-09-12 DIAGNOSIS — G629 Polyneuropathy, unspecified: Secondary | ICD-10-CM | POA: Diagnosis not present

## 2015-09-12 DIAGNOSIS — J432 Centrilobular emphysema: Secondary | ICD-10-CM | POA: Diagnosis not present

## 2015-09-12 DIAGNOSIS — S82141D Displaced bicondylar fracture of right tibia, subsequent encounter for closed fracture with routine healing: Secondary | ICD-10-CM | POA: Diagnosis not present

## 2015-09-12 DIAGNOSIS — I5032 Chronic diastolic (congestive) heart failure: Secondary | ICD-10-CM | POA: Diagnosis not present

## 2015-09-13 DIAGNOSIS — S82141D Displaced bicondylar fracture of right tibia, subsequent encounter for closed fracture with routine healing: Secondary | ICD-10-CM | POA: Diagnosis not present

## 2015-09-13 DIAGNOSIS — M25561 Pain in right knee: Secondary | ICD-10-CM | POA: Diagnosis not present

## 2015-09-13 DIAGNOSIS — G629 Polyneuropathy, unspecified: Secondary | ICD-10-CM | POA: Diagnosis not present

## 2015-09-13 DIAGNOSIS — M6281 Muscle weakness (generalized): Secondary | ICD-10-CM | POA: Diagnosis not present

## 2015-09-13 DIAGNOSIS — R4182 Altered mental status, unspecified: Secondary | ICD-10-CM | POA: Diagnosis not present

## 2015-09-13 DIAGNOSIS — S9031XD Contusion of right foot, subsequent encounter: Secondary | ICD-10-CM | POA: Diagnosis not present

## 2015-09-16 DIAGNOSIS — H401112 Primary open-angle glaucoma, right eye, moderate stage: Secondary | ICD-10-CM | POA: Diagnosis not present

## 2015-09-16 DIAGNOSIS — M6281 Muscle weakness (generalized): Secondary | ICD-10-CM | POA: Diagnosis not present

## 2015-09-16 DIAGNOSIS — S9031XD Contusion of right foot, subsequent encounter: Secondary | ICD-10-CM | POA: Diagnosis not present

## 2015-09-16 DIAGNOSIS — S82141D Displaced bicondylar fracture of right tibia, subsequent encounter for closed fracture with routine healing: Secondary | ICD-10-CM | POA: Diagnosis not present

## 2015-09-16 DIAGNOSIS — H353112 Nonexudative age-related macular degeneration, right eye, intermediate dry stage: Secondary | ICD-10-CM | POA: Diagnosis not present

## 2015-09-16 DIAGNOSIS — H401123 Primary open-angle glaucoma, left eye, severe stage: Secondary | ICD-10-CM | POA: Diagnosis not present

## 2015-09-16 DIAGNOSIS — G629 Polyneuropathy, unspecified: Secondary | ICD-10-CM | POA: Diagnosis not present

## 2015-09-16 DIAGNOSIS — H353123 Nonexudative age-related macular degeneration, left eye, advanced atrophic without subfoveal involvement: Secondary | ICD-10-CM | POA: Diagnosis not present

## 2015-09-16 DIAGNOSIS — M25561 Pain in right knee: Secondary | ICD-10-CM | POA: Diagnosis not present

## 2015-09-16 DIAGNOSIS — R4182 Altered mental status, unspecified: Secondary | ICD-10-CM | POA: Diagnosis not present

## 2015-09-17 DIAGNOSIS — M6281 Muscle weakness (generalized): Secondary | ICD-10-CM | POA: Diagnosis not present

## 2015-09-17 DIAGNOSIS — M25561 Pain in right knee: Secondary | ICD-10-CM | POA: Diagnosis not present

## 2015-09-17 DIAGNOSIS — S9031XD Contusion of right foot, subsequent encounter: Secondary | ICD-10-CM | POA: Diagnosis not present

## 2015-09-17 DIAGNOSIS — R4182 Altered mental status, unspecified: Secondary | ICD-10-CM | POA: Diagnosis not present

## 2015-09-17 DIAGNOSIS — S82141D Displaced bicondylar fracture of right tibia, subsequent encounter for closed fracture with routine healing: Secondary | ICD-10-CM | POA: Diagnosis not present

## 2015-09-17 DIAGNOSIS — G629 Polyneuropathy, unspecified: Secondary | ICD-10-CM | POA: Diagnosis not present

## 2015-09-18 DIAGNOSIS — M6281 Muscle weakness (generalized): Secondary | ICD-10-CM | POA: Diagnosis not present

## 2015-09-18 DIAGNOSIS — S82141D Displaced bicondylar fracture of right tibia, subsequent encounter for closed fracture with routine healing: Secondary | ICD-10-CM | POA: Diagnosis not present

## 2015-09-18 DIAGNOSIS — M25561 Pain in right knee: Secondary | ICD-10-CM | POA: Diagnosis not present

## 2015-09-18 DIAGNOSIS — G629 Polyneuropathy, unspecified: Secondary | ICD-10-CM | POA: Diagnosis not present

## 2015-09-18 DIAGNOSIS — R4182 Altered mental status, unspecified: Secondary | ICD-10-CM | POA: Diagnosis not present

## 2015-09-18 DIAGNOSIS — S9031XD Contusion of right foot, subsequent encounter: Secondary | ICD-10-CM | POA: Diagnosis not present

## 2015-09-19 DIAGNOSIS — S9031XD Contusion of right foot, subsequent encounter: Secondary | ICD-10-CM | POA: Diagnosis not present

## 2015-09-19 DIAGNOSIS — M25561 Pain in right knee: Secondary | ICD-10-CM | POA: Diagnosis not present

## 2015-09-19 DIAGNOSIS — S82141D Displaced bicondylar fracture of right tibia, subsequent encounter for closed fracture with routine healing: Secondary | ICD-10-CM | POA: Diagnosis not present

## 2015-09-19 DIAGNOSIS — G629 Polyneuropathy, unspecified: Secondary | ICD-10-CM | POA: Diagnosis not present

## 2015-09-19 DIAGNOSIS — M6281 Muscle weakness (generalized): Secondary | ICD-10-CM | POA: Diagnosis not present

## 2015-09-19 DIAGNOSIS — R4182 Altered mental status, unspecified: Secondary | ICD-10-CM | POA: Diagnosis not present

## 2015-09-20 DIAGNOSIS — M25561 Pain in right knee: Secondary | ICD-10-CM | POA: Diagnosis not present

## 2015-09-20 DIAGNOSIS — S82141D Displaced bicondylar fracture of right tibia, subsequent encounter for closed fracture with routine healing: Secondary | ICD-10-CM | POA: Diagnosis not present

## 2015-09-20 DIAGNOSIS — M6281 Muscle weakness (generalized): Secondary | ICD-10-CM | POA: Diagnosis not present

## 2015-09-20 DIAGNOSIS — R4182 Altered mental status, unspecified: Secondary | ICD-10-CM | POA: Diagnosis not present

## 2015-09-20 DIAGNOSIS — S9031XD Contusion of right foot, subsequent encounter: Secondary | ICD-10-CM | POA: Diagnosis not present

## 2015-09-20 DIAGNOSIS — G629 Polyneuropathy, unspecified: Secondary | ICD-10-CM | POA: Diagnosis not present

## 2015-09-23 DIAGNOSIS — S82141D Displaced bicondylar fracture of right tibia, subsequent encounter for closed fracture with routine healing: Secondary | ICD-10-CM | POA: Diagnosis not present

## 2015-09-23 DIAGNOSIS — M25561 Pain in right knee: Secondary | ICD-10-CM | POA: Diagnosis not present

## 2015-09-23 DIAGNOSIS — M6281 Muscle weakness (generalized): Secondary | ICD-10-CM | POA: Diagnosis not present

## 2015-09-23 DIAGNOSIS — G629 Polyneuropathy, unspecified: Secondary | ICD-10-CM | POA: Diagnosis not present

## 2015-09-23 DIAGNOSIS — S9031XD Contusion of right foot, subsequent encounter: Secondary | ICD-10-CM | POA: Diagnosis not present

## 2015-09-23 DIAGNOSIS — R4182 Altered mental status, unspecified: Secondary | ICD-10-CM | POA: Diagnosis not present

## 2015-09-24 DIAGNOSIS — M25561 Pain in right knee: Secondary | ICD-10-CM | POA: Diagnosis not present

## 2015-09-24 DIAGNOSIS — M6281 Muscle weakness (generalized): Secondary | ICD-10-CM | POA: Diagnosis not present

## 2015-09-24 DIAGNOSIS — S9031XD Contusion of right foot, subsequent encounter: Secondary | ICD-10-CM | POA: Diagnosis not present

## 2015-09-24 DIAGNOSIS — R4182 Altered mental status, unspecified: Secondary | ICD-10-CM | POA: Diagnosis not present

## 2015-09-24 DIAGNOSIS — G629 Polyneuropathy, unspecified: Secondary | ICD-10-CM | POA: Diagnosis not present

## 2015-09-24 DIAGNOSIS — S82141D Displaced bicondylar fracture of right tibia, subsequent encounter for closed fracture with routine healing: Secondary | ICD-10-CM | POA: Diagnosis not present

## 2015-09-25 DIAGNOSIS — S9031XD Contusion of right foot, subsequent encounter: Secondary | ICD-10-CM | POA: Diagnosis not present

## 2015-09-25 DIAGNOSIS — G629 Polyneuropathy, unspecified: Secondary | ICD-10-CM | POA: Diagnosis not present

## 2015-09-25 DIAGNOSIS — S82141D Displaced bicondylar fracture of right tibia, subsequent encounter for closed fracture with routine healing: Secondary | ICD-10-CM | POA: Diagnosis not present

## 2015-09-25 DIAGNOSIS — R4182 Altered mental status, unspecified: Secondary | ICD-10-CM | POA: Diagnosis not present

## 2015-09-25 DIAGNOSIS — M25561 Pain in right knee: Secondary | ICD-10-CM | POA: Diagnosis not present

## 2015-09-25 DIAGNOSIS — M6281 Muscle weakness (generalized): Secondary | ICD-10-CM | POA: Diagnosis not present

## 2015-09-26 DIAGNOSIS — S9031XD Contusion of right foot, subsequent encounter: Secondary | ICD-10-CM | POA: Diagnosis not present

## 2015-09-26 DIAGNOSIS — M6281 Muscle weakness (generalized): Secondary | ICD-10-CM | POA: Diagnosis not present

## 2015-09-26 DIAGNOSIS — S82141D Displaced bicondylar fracture of right tibia, subsequent encounter for closed fracture with routine healing: Secondary | ICD-10-CM | POA: Diagnosis not present

## 2015-09-26 DIAGNOSIS — G629 Polyneuropathy, unspecified: Secondary | ICD-10-CM | POA: Diagnosis not present

## 2015-09-26 DIAGNOSIS — R4182 Altered mental status, unspecified: Secondary | ICD-10-CM | POA: Diagnosis not present

## 2015-09-26 DIAGNOSIS — M25561 Pain in right knee: Secondary | ICD-10-CM | POA: Diagnosis not present

## 2015-09-27 DIAGNOSIS — M25561 Pain in right knee: Secondary | ICD-10-CM | POA: Diagnosis not present

## 2015-09-27 DIAGNOSIS — M6281 Muscle weakness (generalized): Secondary | ICD-10-CM | POA: Diagnosis not present

## 2015-09-27 DIAGNOSIS — G629 Polyneuropathy, unspecified: Secondary | ICD-10-CM | POA: Diagnosis not present

## 2015-09-27 DIAGNOSIS — S82141D Displaced bicondylar fracture of right tibia, subsequent encounter for closed fracture with routine healing: Secondary | ICD-10-CM | POA: Diagnosis not present

## 2015-09-27 DIAGNOSIS — S9031XD Contusion of right foot, subsequent encounter: Secondary | ICD-10-CM | POA: Diagnosis not present

## 2015-09-27 DIAGNOSIS — R4182 Altered mental status, unspecified: Secondary | ICD-10-CM | POA: Diagnosis not present

## 2015-09-30 DIAGNOSIS — S9031XD Contusion of right foot, subsequent encounter: Secondary | ICD-10-CM | POA: Diagnosis not present

## 2015-09-30 DIAGNOSIS — G629 Polyneuropathy, unspecified: Secondary | ICD-10-CM | POA: Diagnosis not present

## 2015-09-30 DIAGNOSIS — M25561 Pain in right knee: Secondary | ICD-10-CM | POA: Diagnosis not present

## 2015-09-30 DIAGNOSIS — R4182 Altered mental status, unspecified: Secondary | ICD-10-CM | POA: Diagnosis not present

## 2015-09-30 DIAGNOSIS — M6281 Muscle weakness (generalized): Secondary | ICD-10-CM | POA: Diagnosis not present

## 2015-09-30 DIAGNOSIS — S82141D Displaced bicondylar fracture of right tibia, subsequent encounter for closed fracture with routine healing: Secondary | ICD-10-CM | POA: Diagnosis not present

## 2015-10-01 DIAGNOSIS — S9031XD Contusion of right foot, subsequent encounter: Secondary | ICD-10-CM | POA: Diagnosis not present

## 2015-10-01 DIAGNOSIS — S82141D Displaced bicondylar fracture of right tibia, subsequent encounter for closed fracture with routine healing: Secondary | ICD-10-CM | POA: Diagnosis not present

## 2015-10-01 DIAGNOSIS — M6281 Muscle weakness (generalized): Secondary | ICD-10-CM | POA: Diagnosis not present

## 2015-10-01 DIAGNOSIS — M25561 Pain in right knee: Secondary | ICD-10-CM | POA: Diagnosis not present

## 2015-10-01 DIAGNOSIS — R4182 Altered mental status, unspecified: Secondary | ICD-10-CM | POA: Diagnosis not present

## 2015-10-01 DIAGNOSIS — G629 Polyneuropathy, unspecified: Secondary | ICD-10-CM | POA: Diagnosis not present

## 2015-10-02 DIAGNOSIS — J449 Chronic obstructive pulmonary disease, unspecified: Secondary | ICD-10-CM | POA: Diagnosis not present

## 2015-10-02 DIAGNOSIS — Z993 Dependence on wheelchair: Secondary | ICD-10-CM | POA: Diagnosis not present

## 2015-10-02 DIAGNOSIS — Z7901 Long term (current) use of anticoagulants: Secondary | ICD-10-CM | POA: Diagnosis not present

## 2015-10-02 DIAGNOSIS — R32 Unspecified urinary incontinence: Secondary | ICD-10-CM | POA: Diagnosis not present

## 2015-10-02 DIAGNOSIS — H3530 Unspecified macular degeneration: Secondary | ICD-10-CM | POA: Diagnosis not present

## 2015-10-02 DIAGNOSIS — I5033 Acute on chronic diastolic (congestive) heart failure: Secondary | ICD-10-CM | POA: Diagnosis not present

## 2015-10-02 DIAGNOSIS — G629 Polyneuropathy, unspecified: Secondary | ICD-10-CM | POA: Diagnosis not present

## 2015-10-02 DIAGNOSIS — I4891 Unspecified atrial fibrillation: Secondary | ICD-10-CM | POA: Diagnosis not present

## 2015-10-02 DIAGNOSIS — S9031XD Contusion of right foot, subsequent encounter: Secondary | ICD-10-CM | POA: Diagnosis not present

## 2015-10-02 DIAGNOSIS — Z9181 History of falling: Secondary | ICD-10-CM | POA: Diagnosis not present

## 2015-10-02 DIAGNOSIS — S82141D Displaced bicondylar fracture of right tibia, subsequent encounter for closed fracture with routine healing: Secondary | ICD-10-CM | POA: Diagnosis not present

## 2015-10-04 DIAGNOSIS — G629 Polyneuropathy, unspecified: Secondary | ICD-10-CM | POA: Diagnosis not present

## 2015-10-04 DIAGNOSIS — I4891 Unspecified atrial fibrillation: Secondary | ICD-10-CM | POA: Diagnosis not present

## 2015-10-04 DIAGNOSIS — S9031XD Contusion of right foot, subsequent encounter: Secondary | ICD-10-CM | POA: Diagnosis not present

## 2015-10-04 DIAGNOSIS — I5033 Acute on chronic diastolic (congestive) heart failure: Secondary | ICD-10-CM | POA: Diagnosis not present

## 2015-10-04 DIAGNOSIS — J449 Chronic obstructive pulmonary disease, unspecified: Secondary | ICD-10-CM | POA: Diagnosis not present

## 2015-10-04 DIAGNOSIS — S82141D Displaced bicondylar fracture of right tibia, subsequent encounter for closed fracture with routine healing: Secondary | ICD-10-CM | POA: Diagnosis not present

## 2015-10-07 DIAGNOSIS — J449 Chronic obstructive pulmonary disease, unspecified: Secondary | ICD-10-CM | POA: Diagnosis not present

## 2015-10-07 DIAGNOSIS — I5033 Acute on chronic diastolic (congestive) heart failure: Secondary | ICD-10-CM | POA: Diagnosis not present

## 2015-10-07 DIAGNOSIS — S82141D Displaced bicondylar fracture of right tibia, subsequent encounter for closed fracture with routine healing: Secondary | ICD-10-CM | POA: Diagnosis not present

## 2015-10-07 DIAGNOSIS — S9031XD Contusion of right foot, subsequent encounter: Secondary | ICD-10-CM | POA: Diagnosis not present

## 2015-10-07 DIAGNOSIS — I4891 Unspecified atrial fibrillation: Secondary | ICD-10-CM | POA: Diagnosis not present

## 2015-10-07 DIAGNOSIS — G629 Polyneuropathy, unspecified: Secondary | ICD-10-CM | POA: Diagnosis not present

## 2015-10-08 DIAGNOSIS — I5033 Acute on chronic diastolic (congestive) heart failure: Secondary | ICD-10-CM | POA: Diagnosis not present

## 2015-10-08 DIAGNOSIS — S9031XD Contusion of right foot, subsequent encounter: Secondary | ICD-10-CM | POA: Diagnosis not present

## 2015-10-08 DIAGNOSIS — J449 Chronic obstructive pulmonary disease, unspecified: Secondary | ICD-10-CM | POA: Diagnosis not present

## 2015-10-08 DIAGNOSIS — I4891 Unspecified atrial fibrillation: Secondary | ICD-10-CM | POA: Diagnosis not present

## 2015-10-08 DIAGNOSIS — S82141D Displaced bicondylar fracture of right tibia, subsequent encounter for closed fracture with routine healing: Secondary | ICD-10-CM | POA: Diagnosis not present

## 2015-10-08 DIAGNOSIS — G629 Polyneuropathy, unspecified: Secondary | ICD-10-CM | POA: Diagnosis not present

## 2015-10-09 DIAGNOSIS — M79661 Pain in right lower leg: Secondary | ICD-10-CM | POA: Diagnosis not present

## 2015-10-10 DIAGNOSIS — J449 Chronic obstructive pulmonary disease, unspecified: Secondary | ICD-10-CM | POA: Diagnosis not present

## 2015-10-10 DIAGNOSIS — S82141D Displaced bicondylar fracture of right tibia, subsequent encounter for closed fracture with routine healing: Secondary | ICD-10-CM | POA: Diagnosis not present

## 2015-10-10 DIAGNOSIS — S9031XD Contusion of right foot, subsequent encounter: Secondary | ICD-10-CM | POA: Diagnosis not present

## 2015-10-10 DIAGNOSIS — I4891 Unspecified atrial fibrillation: Secondary | ICD-10-CM | POA: Diagnosis not present

## 2015-10-10 DIAGNOSIS — G629 Polyneuropathy, unspecified: Secondary | ICD-10-CM | POA: Diagnosis not present

## 2015-10-10 DIAGNOSIS — I5033 Acute on chronic diastolic (congestive) heart failure: Secondary | ICD-10-CM | POA: Diagnosis not present

## 2015-10-14 DIAGNOSIS — J449 Chronic obstructive pulmonary disease, unspecified: Secondary | ICD-10-CM | POA: Diagnosis not present

## 2015-10-14 DIAGNOSIS — I4891 Unspecified atrial fibrillation: Secondary | ICD-10-CM | POA: Diagnosis not present

## 2015-10-14 DIAGNOSIS — S82141D Displaced bicondylar fracture of right tibia, subsequent encounter for closed fracture with routine healing: Secondary | ICD-10-CM | POA: Diagnosis not present

## 2015-10-14 DIAGNOSIS — G629 Polyneuropathy, unspecified: Secondary | ICD-10-CM | POA: Diagnosis not present

## 2015-10-14 DIAGNOSIS — S9031XD Contusion of right foot, subsequent encounter: Secondary | ICD-10-CM | POA: Diagnosis not present

## 2015-10-14 DIAGNOSIS — I5033 Acute on chronic diastolic (congestive) heart failure: Secondary | ICD-10-CM | POA: Diagnosis not present

## 2015-10-16 DIAGNOSIS — S9031XD Contusion of right foot, subsequent encounter: Secondary | ICD-10-CM | POA: Diagnosis not present

## 2015-10-16 DIAGNOSIS — I4891 Unspecified atrial fibrillation: Secondary | ICD-10-CM | POA: Diagnosis not present

## 2015-10-16 DIAGNOSIS — J449 Chronic obstructive pulmonary disease, unspecified: Secondary | ICD-10-CM | POA: Diagnosis not present

## 2015-10-16 DIAGNOSIS — I5033 Acute on chronic diastolic (congestive) heart failure: Secondary | ICD-10-CM | POA: Diagnosis not present

## 2015-10-16 DIAGNOSIS — G629 Polyneuropathy, unspecified: Secondary | ICD-10-CM | POA: Diagnosis not present

## 2015-10-16 DIAGNOSIS — S82141D Displaced bicondylar fracture of right tibia, subsequent encounter for closed fracture with routine healing: Secondary | ICD-10-CM | POA: Diagnosis not present

## 2015-10-17 DIAGNOSIS — I5033 Acute on chronic diastolic (congestive) heart failure: Secondary | ICD-10-CM | POA: Diagnosis not present

## 2015-10-17 DIAGNOSIS — J449 Chronic obstructive pulmonary disease, unspecified: Secondary | ICD-10-CM | POA: Diagnosis not present

## 2015-10-17 DIAGNOSIS — S9031XD Contusion of right foot, subsequent encounter: Secondary | ICD-10-CM | POA: Diagnosis not present

## 2015-10-17 DIAGNOSIS — S82141D Displaced bicondylar fracture of right tibia, subsequent encounter for closed fracture with routine healing: Secondary | ICD-10-CM | POA: Diagnosis not present

## 2015-10-17 DIAGNOSIS — I4891 Unspecified atrial fibrillation: Secondary | ICD-10-CM | POA: Diagnosis not present

## 2015-10-17 DIAGNOSIS — G629 Polyneuropathy, unspecified: Secondary | ICD-10-CM | POA: Diagnosis not present

## 2015-10-21 DIAGNOSIS — S9031XD Contusion of right foot, subsequent encounter: Secondary | ICD-10-CM | POA: Diagnosis not present

## 2015-10-21 DIAGNOSIS — E559 Vitamin D deficiency, unspecified: Secondary | ICD-10-CM | POA: Diagnosis not present

## 2015-10-21 DIAGNOSIS — J449 Chronic obstructive pulmonary disease, unspecified: Secondary | ICD-10-CM | POA: Diagnosis not present

## 2015-10-21 DIAGNOSIS — G629 Polyneuropathy, unspecified: Secondary | ICD-10-CM | POA: Diagnosis not present

## 2015-10-21 DIAGNOSIS — N183 Chronic kidney disease, stage 3 (moderate): Secondary | ICD-10-CM | POA: Diagnosis not present

## 2015-10-21 DIAGNOSIS — Z79899 Other long term (current) drug therapy: Secondary | ICD-10-CM | POA: Diagnosis not present

## 2015-10-21 DIAGNOSIS — R791 Abnormal coagulation profile: Secondary | ICD-10-CM | POA: Diagnosis not present

## 2015-10-21 DIAGNOSIS — G8929 Other chronic pain: Secondary | ICD-10-CM | POA: Diagnosis not present

## 2015-10-21 DIAGNOSIS — E782 Mixed hyperlipidemia: Secondary | ICD-10-CM | POA: Diagnosis not present

## 2015-10-21 DIAGNOSIS — Z6821 Body mass index (BMI) 21.0-21.9, adult: Secondary | ICD-10-CM | POA: Diagnosis not present

## 2015-10-21 DIAGNOSIS — S82141D Displaced bicondylar fracture of right tibia, subsequent encounter for closed fracture with routine healing: Secondary | ICD-10-CM | POA: Diagnosis not present

## 2015-10-21 DIAGNOSIS — E039 Hypothyroidism, unspecified: Secondary | ICD-10-CM | POA: Diagnosis not present

## 2015-10-21 DIAGNOSIS — I5033 Acute on chronic diastolic (congestive) heart failure: Secondary | ICD-10-CM | POA: Diagnosis not present

## 2015-10-21 DIAGNOSIS — Z1389 Encounter for screening for other disorder: Secondary | ICD-10-CM | POA: Diagnosis not present

## 2015-10-21 DIAGNOSIS — I4891 Unspecified atrial fibrillation: Secondary | ICD-10-CM | POA: Diagnosis not present

## 2015-10-23 DIAGNOSIS — G629 Polyneuropathy, unspecified: Secondary | ICD-10-CM | POA: Diagnosis not present

## 2015-10-23 DIAGNOSIS — S9031XD Contusion of right foot, subsequent encounter: Secondary | ICD-10-CM | POA: Diagnosis not present

## 2015-10-23 DIAGNOSIS — S82141D Displaced bicondylar fracture of right tibia, subsequent encounter for closed fracture with routine healing: Secondary | ICD-10-CM | POA: Diagnosis not present

## 2015-10-23 DIAGNOSIS — I5033 Acute on chronic diastolic (congestive) heart failure: Secondary | ICD-10-CM | POA: Diagnosis not present

## 2015-10-23 DIAGNOSIS — I4891 Unspecified atrial fibrillation: Secondary | ICD-10-CM | POA: Diagnosis not present

## 2015-10-23 DIAGNOSIS — J449 Chronic obstructive pulmonary disease, unspecified: Secondary | ICD-10-CM | POA: Diagnosis not present

## 2015-11-04 DIAGNOSIS — R791 Abnormal coagulation profile: Secondary | ICD-10-CM | POA: Diagnosis not present

## 2015-11-18 DIAGNOSIS — Z7901 Long term (current) use of anticoagulants: Secondary | ICD-10-CM | POA: Diagnosis not present

## 2015-12-19 DIAGNOSIS — Z7901 Long term (current) use of anticoagulants: Secondary | ICD-10-CM | POA: Diagnosis not present

## 2015-12-26 DIAGNOSIS — R0602 Shortness of breath: Secondary | ICD-10-CM | POA: Diagnosis not present

## 2015-12-26 DIAGNOSIS — I5032 Chronic diastolic (congestive) heart failure: Secondary | ICD-10-CM | POA: Diagnosis not present

## 2015-12-26 DIAGNOSIS — I482 Chronic atrial fibrillation: Secondary | ICD-10-CM | POA: Diagnosis not present

## 2015-12-26 DIAGNOSIS — J432 Centrilobular emphysema: Secondary | ICD-10-CM | POA: Diagnosis not present

## 2016-01-20 DIAGNOSIS — Z7901 Long term (current) use of anticoagulants: Secondary | ICD-10-CM | POA: Diagnosis not present

## 2016-02-24 DIAGNOSIS — Z7901 Long term (current) use of anticoagulants: Secondary | ICD-10-CM | POA: Diagnosis not present

## 2016-03-30 DIAGNOSIS — Z7901 Long term (current) use of anticoagulants: Secondary | ICD-10-CM | POA: Diagnosis not present

## 2016-04-22 DIAGNOSIS — E782 Mixed hyperlipidemia: Secondary | ICD-10-CM | POA: Diagnosis not present

## 2016-04-22 DIAGNOSIS — Z79899 Other long term (current) drug therapy: Secondary | ICD-10-CM | POA: Diagnosis not present

## 2016-04-22 DIAGNOSIS — R32 Unspecified urinary incontinence: Secondary | ICD-10-CM | POA: Diagnosis not present

## 2016-04-22 DIAGNOSIS — N183 Chronic kidney disease, stage 3 (moderate): Secondary | ICD-10-CM | POA: Diagnosis not present

## 2016-04-22 DIAGNOSIS — I4891 Unspecified atrial fibrillation: Secondary | ICD-10-CM | POA: Diagnosis not present

## 2016-04-22 DIAGNOSIS — E876 Hypokalemia: Secondary | ICD-10-CM | POA: Diagnosis not present

## 2016-04-22 DIAGNOSIS — Z9181 History of falling: Secondary | ICD-10-CM | POA: Diagnosis not present

## 2016-04-22 DIAGNOSIS — G8929 Other chronic pain: Secondary | ICD-10-CM | POA: Diagnosis not present

## 2016-04-22 DIAGNOSIS — E039 Hypothyroidism, unspecified: Secondary | ICD-10-CM | POA: Diagnosis not present

## 2016-04-22 DIAGNOSIS — E559 Vitamin D deficiency, unspecified: Secondary | ICD-10-CM | POA: Diagnosis not present

## 2016-04-30 DIAGNOSIS — Z7901 Long term (current) use of anticoagulants: Secondary | ICD-10-CM | POA: Diagnosis not present

## 2016-05-08 DIAGNOSIS — H401133 Primary open-angle glaucoma, bilateral, severe stage: Secondary | ICD-10-CM | POA: Diagnosis not present

## 2016-05-08 DIAGNOSIS — H353190 Nonexudative age-related macular degeneration, unspecified eye, stage unspecified: Secondary | ICD-10-CM | POA: Diagnosis not present

## 2016-06-01 DIAGNOSIS — Z23 Encounter for immunization: Secondary | ICD-10-CM | POA: Diagnosis not present

## 2016-06-01 DIAGNOSIS — Z7901 Long term (current) use of anticoagulants: Secondary | ICD-10-CM | POA: Diagnosis not present

## 2016-06-15 DIAGNOSIS — J069 Acute upper respiratory infection, unspecified: Secondary | ICD-10-CM | POA: Diagnosis not present

## 2016-07-01 ENCOUNTER — Emergency Department (HOSPITAL_COMMUNITY): Payer: Medicare Other

## 2016-07-01 ENCOUNTER — Encounter (HOSPITAL_COMMUNITY): Payer: Self-pay | Admitting: Nurse Practitioner

## 2016-07-01 ENCOUNTER — Emergency Department (HOSPITAL_COMMUNITY)
Admission: EM | Admit: 2016-07-01 | Discharge: 2016-07-01 | Disposition: A | Discharge status: Home / Self Care | Payer: Medicare Other | Attending: Emergency Medicine | Admitting: Emergency Medicine

## 2016-07-01 DIAGNOSIS — R531 Weakness: Secondary | ICD-10-CM | POA: Diagnosis not present

## 2016-07-01 DIAGNOSIS — Z7901 Long term (current) use of anticoagulants: Secondary | ICD-10-CM | POA: Insufficient documentation

## 2016-07-01 DIAGNOSIS — R0602 Shortness of breath: Secondary | ICD-10-CM | POA: Diagnosis present

## 2016-07-01 DIAGNOSIS — R069 Unspecified abnormalities of breathing: Secondary | ICD-10-CM | POA: Diagnosis not present

## 2016-07-01 DIAGNOSIS — R0600 Dyspnea, unspecified: Secondary | ICD-10-CM | POA: Diagnosis not present

## 2016-07-01 DIAGNOSIS — E039 Hypothyroidism, unspecified: Secondary | ICD-10-CM | POA: Insufficient documentation

## 2016-07-01 DIAGNOSIS — I509 Heart failure, unspecified: Secondary | ICD-10-CM | POA: Insufficient documentation

## 2016-07-01 DIAGNOSIS — J449 Chronic obstructive pulmonary disease, unspecified: Secondary | ICD-10-CM | POA: Diagnosis not present

## 2016-07-01 LAB — CBC WITH DIFFERENTIAL/PLATELET
Basophils Absolute: 0 10*3/uL (ref 0.0–0.1)
Basophils Relative: 0 %
Eosinophils Absolute: 0 10*3/uL (ref 0.0–0.7)
Eosinophils Relative: 1 %
HCT: 36 % (ref 36.0–46.0)
Hemoglobin: 11.9 g/dL — ABNORMAL LOW (ref 12.0–15.0)
Lymphocytes Relative: 7 %
Lymphs Abs: 0.4 10*3/uL — ABNORMAL LOW (ref 0.7–4.0)
MCH: 30.6 pg (ref 26.0–34.0)
MCHC: 33.1 g/dL (ref 30.0–36.0)
MCV: 92.5 fL (ref 78.0–100.0)
Monocytes Absolute: 0.3 10*3/uL (ref 0.1–1.0)
Monocytes Relative: 4 %
Neutro Abs: 5.6 10*3/uL (ref 1.7–7.7)
Neutrophils Relative %: 88 %
Platelets: 206 10*3/uL (ref 150–400)
RBC: 3.89 MIL/uL (ref 3.87–5.11)
RDW: 13.7 % (ref 11.5–15.5)
WBC: 6.3 10*3/uL (ref 4.0–10.5)

## 2016-07-01 LAB — BASIC METABOLIC PANEL
Anion gap: 9 (ref 5–15)
BUN: 10 mg/dL (ref 6–20)
CO2: 28 mmol/L (ref 22–32)
Calcium: 9.2 mg/dL (ref 8.9–10.3)
Chloride: 99 mmol/L — ABNORMAL LOW (ref 101–111)
Creatinine, Ser: 0.81 mg/dL (ref 0.44–1.00)
GFR calc Af Amer: >60 mL/min (ref >60)
GFR calc non Af Amer: >60 mL/min (ref >60)
Glucose, Bld: 109 mg/dL — ABNORMAL HIGH (ref 65–99)
Potassium: 3.1 mmol/L — ABNORMAL LOW (ref 3.5–5.1)
Sodium: 136 mmol/L (ref 135–145)

## 2016-07-01 LAB — TROPONIN I: Troponin I: <0.03 ng/mL (ref <0.03)

## 2016-07-01 LAB — BRAIN NATRIURETIC PEPTIDE: B Natriuretic Peptide: 983.1 pg/mL — ABNORMAL HIGH (ref 0.0–100.0)

## 2016-07-01 MED ORDER — FUROSEMIDE 20 MG PO TABS: 20.0000 mg | ORAL_TABLET | Freq: Two times a day (BID) | ORAL | 0 refills | Status: DC

## 2016-07-01 MED ORDER — POTASSIUM CHLORIDE CRYS ER 20 MEQ PO TBCR
60.0000 meq | EXTENDED_RELEASE_TABLET | Freq: Once | ORAL | Status: AC
  Administered 2016-07-01: 60 meq via ORAL
  Filled 2016-07-01: qty 3

## 2016-07-01 MED ORDER — FUROSEMIDE 10 MG/ML IJ SOLN
40.0000 mg | Freq: Once | INTRAMUSCULAR | Status: AC
  Administered 2016-07-01: 40 mg via INTRAVENOUS
  Filled 2016-07-01: qty 4

## 2016-07-01 MED ORDER — POTASSIUM CHLORIDE ER 10 MEQ PO TBCR: 10.0000 meq | EXTENDED_RELEASE_TABLET | Freq: Two times a day (BID) | ORAL | 0 refills | Status: DC

## 2016-07-01 NOTE — ED Notes (Signed)
Assisted patient to wheelchair and to vehicle after D/C.

## 2016-07-01 NOTE — ED Notes (Signed)
Patient d/c'd in the care of family.  D/C instructions and medications reviewed with patient and family.  Patient and family verbalized understanding.

## 2016-07-01 NOTE — ED Provider Notes (Signed)
[place] DEPT Provider Note   CSN: [id number] Arrival date & time: 07/01/16  1005  By signing my name below, I, Sonum Patel, attest that this documentation has been prepared under the direction and in the presence of Virgel Manifold, MD. Electronically Signed: Sonum Patel, Education administrator. 07/01/16. 10:31 AM.  History   Chief Complaint No chief complaint on file.   The history is provided by the patient and a relative. No language interpreter was used.     HPI Comments: [name] is a 82 y.o. female with past medical history of COPD, Afib who presents to the Emergency Department complaining of intermittent SOB for the past few weeks. She was seen by PCP and was started on prednisone and doxycycline. She has completed those medications and daughters state she initially was improving and then her symptoms returned last night. Daughter states patient was agitated and disoriented last night. She states her SOB is worse with lying in a supine position. She has used albuterol nebulizer treatments; last treatment was 5 hours ago. She reports having nausea and chills lately. She takes Coumadin for a history of Afib. She denies coughing, leg swelling, vomiting, any pain, fever.   Past Medical History:  Diagnosis Date  . Atrial fibrillation ([place])   . COPD (chronic obstructive pulmonary disease) ([place])   . Fall   . Glaucoma   . Hypothyroidism   . Neuropathy ([facility])    PERIPHERAL  . Osteoarthritis   . Osteoporosis   . Trimalleolar fracture    LEFT TRIMALLEOLAR FRACTURE, DISLOCATION WITH OBLIQUE FRACTURE OF THE DISTAL FIBULAR DIAPHYSIS, AND NOTED BEING MILDLY COMMINUTED    Patient Active Problem List   Diagnosis Date Noted  . Atrial fibrillation ([place])   . Hypothyroidism   . Glaucoma   . Neuropathy ([address])   . Osteoporosis   . Osteoarthritis   . Fall   . Trimalleolar fracture   . HYPOTHYROIDISM 03/02/2007  . GLAUCOMA 03/02/2007  . ATRIAL FIBRILLATION 03/02/2007  . PSORIASIS 03/02/2007     Past Surgical History:  Procedure Laterality Date  . ORIF TIBIA & FIBULA FRACTURES    . VAGINAL HYSTERECTOMY      OB History    No data available       Home Medications    Prior to Admission medications   Medication Sig Start Date End Date Taking? Authorizing Provider  albuterol (PROVENTIL) (2.5 MG/3ML) 0.083% nebulizer solution Take 2.5 mg by nebulization every 6 (six) hours as needed for wheezing or shortness of breath.   Yes Historical Provider, MD  dorzolamide-timolol (COSOPT) 22.3-6.8 MG/ML ophthalmic solution Place 1 drop into the left eye daily.    Yes Historical Provider, MD  ergocalciferol (VITAMIN D2) 50000 UNITS capsule Take 50,000 Units by mouth once a week. thursday   Yes Historical Provider, MD  FLUoxetine (PROZAC) 20 MG tablet Take 20 mg by mouth daily.     Yes Historical Provider, MD  gabapentin (NEURONTIN) 600 MG tablet Take 600 mg by mouth at bedtime. 03/27/15  Yes Historical Provider, MD  levothyroxine (SYNTHROID, LEVOTHROID) 88 MCG tablet Take 88 mcg by mouth daily before breakfast.   Yes Historical Provider, MD  methocarbamol (ROBAXIN) 500 MG tablet Take 500 mg by mouth 2 (two) times daily.   Yes Historical Provider, MD  metoprolol tartrate (LOPRESSOR) 25 MG tablet Take 1 tablet (25 mg total) by mouth 2 (two) times daily. Patient taking differently: Take 12.5 mg by mouth 2 (two) times daily.  12/29/10 07/01/16 Yes Peter M Martinique, MD  Multiple Vitamins-Minerals (EYE VITAMINS PO) Take 1 tablet by mouth daily.    Yes Historical Provider, MD  omeprazole (PRILOSEC) 40 MG capsule Take 40 mg by mouth daily.   Yes Historical Provider, MD  oxyCODONE-acetaminophen (PERCOCET/ROXICET) 5-325 MG per tablet Take 1-2 tablets by mouth every 4 (four) hours as needed for pain. Patient taking differently: Take 1-2 tablets by mouth every 4 (four) hours as needed.  05/24/12  Yes John-Adam Bonk, MD  pilocarpine (PILOCAR) 4 % ophthalmic solution Place 1 drop into the left eye 2 (two)  times daily.    Yes Historical Provider, MD  travoprost, benzalkonium, (TRAVATAN) 0.004 % ophthalmic solution Place 1 drop into the left eye at bedtime.    Yes Historical Provider, MD  acetaminophen (TYLENOL) 500 MG tablet Take 500 mg by mouth every 6 (six) hours as needed for mild pain.    Historical Provider, MD  albuterol (PROVENTIL HFA;VENTOLIN HFA) 108 (90 BASE) MCG/ACT inhaler Inhale 1-2 puffs into the lungs every 6 (six) hours as needed for wheezing or shortness of breath.    Historical Provider, MD  azithromycin (ZITHROMAX) 250 MG tablet Take 1 tablet (250 mg total) by mouth daily. Take first 2 tablets together, then 1 every day until finished. Patient not taking: Reported on 05/07/2015 04/30/15   Forde Dandy, MD  cyanocobalamin (,VITAMIN B-12,) 1000 MCG/ML injection Inject 1,000 mcg into the muscle every 30 (thirty) days. Pt gets this at her md office    Historical Provider, MD  HYDROcodone-acetaminophen (NORCO/VICODIN) 5-325 MG tablet Take 1 tablet by mouth every 6 (six) hours as needed for severe pain. Patient not taking: Reported on 07/01/2016 08/01/15   Leo Grosser, MD  predniSONE (DELTASONE) 20 MG tablet Take 60 mg daily x 2 days then 40 mg daily x 2 days then 20 mg daily x 2 days Patient not taking: Reported on 07/01/2016 05/07/15   Drenda Freeze, MD  warfarin (COUMADIN) 3 MG tablet Take 1.5-3 mg by mouth daily. Mon,wed,fri,sat take 3 mgs tues,thurs,sun 1.5mg s    Historical Provider, MD    Family History History reviewed. No pertinent family history.  Social History Social History  Substance Use Topics  . Smoking status: Never Smoker  . Smokeless tobacco: Never Used  . Alcohol use No     Allergies   Patient has no known allergies.   Review of Systems Review of Systems  A complete 10 system review of systems was obtained and all systems are negative except as noted in the HPI and PMH.    Physical Exam Updated Vital Signs BP 151/83   Pulse 89   Temp 98.9 F  (37.2 C)   Resp 22   Ht 5\' 6"  (1.676 m)   Wt 138 lb (62.6 kg)   SpO2 96%   BMI 22.27 kg/m   Physical Exam  Constitutional: She is oriented to person, place, and time. She appears well-developed and well-nourished. No distress.  HENT:  Head: Normocephalic and atraumatic.  Eyes: EOM are normal.  Neck: Normal range of motion.  Cardiovascular: Normal rate and normal heart sounds.  An irregularly irregular rhythm present.  Pulmonary/Chest: Effort normal. No respiratory distress. She has no wheezes. She has rales.  Crackles to bilateral bases.   Abdominal: Soft. She exhibits no distension. There is no tenderness.  Musculoskeletal: Normal range of motion.  Neurological: She is alert and oriented to person, place, and time.  Skin: Skin is warm and dry.  Psychiatric: She has a normal mood and affect. Judgment  normal.  Nursing note and vitals reviewed.    ED Treatments / Results  DIAGNOSTIC STUDIES: Oxygen Saturation is 96% on RA, adequate by my interpretation.    COORDINATION OF CARE: 10:32 AM Discussed treatment plan with pt at bedside and pt agreed to plan.    Labs (all labs ordered are listed, but only abnormal results are displayed) Labs Reviewed  CBC WITH DIFFERENTIAL/PLATELET - Abnormal; Notable for the following:       Result Value   Hemoglobin 11.9 (*)    Lymphs Abs 0.4 (*)    All other components within normal limits  BASIC METABOLIC PANEL - Abnormal; Notable for the following:    Potassium 3.1 (*)    Chloride 99 (*)    Glucose, Bld 109 (*)    All other components within normal limits  BRAIN NATRIURETIC PEPTIDE - Abnormal; Notable for the following:    B Natriuretic Peptide 983.1 (*)    All other components within normal limits  TROPONIN I    EKG  EKG Interpretation None       Radiology No results found.   Dg Chest 2 View  Result Date: 07/01/2016 CLINICAL DATA:  Several weeks of shortness of breath unresponsive to antibiotics and steroids over the  past week. History of COPD and CHF. EXAM: CHEST  2 VIEW COMPARISON:  PA and lateral chest x-ray of May 07, 2015 FINDINGS: The lungs are mildly hyperinflated. This is accentuated by prominent thoracic kyphosis due to an old T11 compression fracture. There is no alveolar infiltrate. There is no pleural effusion. The cardiac silhouette is enlarged but stable. The pulmonary vascularity is mildly prominent centrally. The interstitial markings are slightly increased overall. There is calcification in the wall of the aortic arch. IMPRESSION: COPD. Chronic cardiomegaly with new mild pulmonary vascular congestion and mild interstitial edema. No alveolar pneumonia or pleural effusion. Thoracic aortic atherosclerosis. Electronically Signed   By: David  Martinique M.D.   On: 07/01/2016 11:00    Procedures Procedures (including critical care time)  Medications Ordered in ED Medications - No data to display   Initial Impression / Assessment and Plan / ED Course  I have reviewed the triage vital signs and the nursing notes.  Pertinent labs & imaging results that were available during my care of the patient were reviewed by me and considered in my medical decision making (see chart for details).  Clinical Course    12:22 PM Updated patient and family on test results.   89yF with persistent dyspnea. Recently tx'd for COPD exacerbation with steroids and abx w/o much improvement. No wheezing on my exam but crackles at bases. Orthopnea. BNP elevated. Some edema on CXR. Clinically more HF that primary pulmonary issue. She is not distressed. O2 sats fine. Will give course of diuretic. I feel she is appropriate for outpt tx at this tme.   Final Clinical Impressions(s) / ED Diagnoses   Final diagnoses:  Dyspnea, unspecified type  Heart failure, unspecified heart failure chronicity, unspecified heart failure type [facility])    New Prescriptions New Prescriptions   No medications on file   I personally preformed  the services scribed in my presence. The recorded information has been reviewed is accurate. Virgel Manifold, MD.    Virgel Manifold, MD 07/05/16 [phone]

## 2016-07-01 NOTE — ED Triage Notes (Signed)
Patient has a history of COPD however she recently went to the dr with SOB and md put her on a steriod and antibiotic. She has completed those medications and still feels SOB and fevers. Her SOB has gotten worse since she completed therapies. Sounds very diminished lower lobes.

## 2016-07-01 NOTE — ED Triage Notes (Signed)
She received 15mg  albuterol 1mg  Atrovent and 125mg  of solumedrol IV with EMS

## 2016-07-01 NOTE — ED Notes (Signed)
Bed: WA12 Expected date:  Expected time:  Means of arrival:  Comments: EMS-SOB 

## 2016-07-01 NOTE — ED Notes (Signed)
RN starting an IV 

## 2016-07-03 DIAGNOSIS — J432 Centrilobular emphysema: Secondary | ICD-10-CM | POA: Diagnosis not present

## 2016-07-03 DIAGNOSIS — I5032 Chronic diastolic (congestive) heart failure: Secondary | ICD-10-CM | POA: Diagnosis not present

## 2016-07-03 DIAGNOSIS — I482 Chronic atrial fibrillation: Secondary | ICD-10-CM | POA: Diagnosis not present

## 2016-07-03 DIAGNOSIS — R0602 Shortness of breath: Secondary | ICD-10-CM | POA: Diagnosis not present

## 2016-07-05 ENCOUNTER — Emergency Department (HOSPITAL_COMMUNITY)
Admission: EM | Admit: 2016-07-05 | Discharge: 2016-07-05 | Disposition: A | Discharge status: Home / Self Care | Payer: Medicare Other | Attending: Emergency Medicine | Admitting: Emergency Medicine

## 2016-07-05 ENCOUNTER — Emergency Department (HOSPITAL_COMMUNITY): Payer: Medicare Other

## 2016-07-05 ENCOUNTER — Encounter (HOSPITAL_COMMUNITY): Payer: Self-pay | Admitting: Emergency Medicine

## 2016-07-05 DIAGNOSIS — Z79899 Other long term (current) drug therapy: Secondary | ICD-10-CM | POA: Insufficient documentation

## 2016-07-05 DIAGNOSIS — J449 Chronic obstructive pulmonary disease, unspecified: Secondary | ICD-10-CM | POA: Insufficient documentation

## 2016-07-05 DIAGNOSIS — R0602 Shortness of breath: Secondary | ICD-10-CM | POA: Insufficient documentation

## 2016-07-05 DIAGNOSIS — Z7901 Long term (current) use of anticoagulants: Secondary | ICD-10-CM | POA: Diagnosis not present

## 2016-07-05 DIAGNOSIS — E039 Hypothyroidism, unspecified: Secondary | ICD-10-CM | POA: Diagnosis not present

## 2016-07-05 LAB — CBC WITH DIFFERENTIAL/PLATELET
Basophils Absolute: 0 10*3/uL (ref 0.0–0.1)
Basophils Relative: 0 %
Eosinophils Absolute: 0 10*3/uL (ref 0.0–0.7)
Eosinophils Relative: 0 %
HCT: 34.9 % — ABNORMAL LOW (ref 36.0–46.0)
Hemoglobin: 11.6 g/dL — ABNORMAL LOW (ref 12.0–15.0)
Lymphocytes Relative: 7 %
Lymphs Abs: 0.4 10*3/uL — ABNORMAL LOW (ref 0.7–4.0)
MCH: 30.6 pg (ref 26.0–34.0)
MCHC: 33.2 g/dL (ref 30.0–36.0)
MCV: 92.1 fL (ref 78.0–100.0)
Monocytes Absolute: 0.3 10*3/uL (ref 0.1–1.0)
Monocytes Relative: 6 %
Neutro Abs: 4.5 10*3/uL (ref 1.7–7.7)
Neutrophils Relative %: 87 %
Platelets: 219 10*3/uL (ref 150–400)
RBC: 3.79 MIL/uL — ABNORMAL LOW (ref 3.87–5.11)
RDW: 13.5 % (ref 11.5–15.5)
WBC: 5.2 10*3/uL (ref 4.0–10.5)

## 2016-07-05 LAB — TROPONIN I: Troponin I: <0.03 ng/mL (ref <0.03)

## 2016-07-05 LAB — BASIC METABOLIC PANEL
Anion gap: 7 (ref 5–15)
BUN: 18 mg/dL (ref 6–20)
CO2: 29 mmol/L (ref 22–32)
Calcium: 9 mg/dL (ref 8.9–10.3)
Chloride: 101 mmol/L (ref 101–111)
Creatinine, Ser: 1.05 mg/dL — ABNORMAL HIGH (ref 0.44–1.00)
GFR calc Af Amer: 54 mL/min — ABNORMAL LOW (ref >60)
GFR calc non Af Amer: 46 mL/min — ABNORMAL LOW (ref >60)
Glucose, Bld: 99 mg/dL (ref 65–99)
Potassium: 3.8 mmol/L (ref 3.5–5.1)
Sodium: 137 mmol/L (ref 135–145)

## 2016-07-05 LAB — BLOOD GAS, ARTERIAL
Acid-Base Excess: 3.2 mmol/L — ABNORMAL HIGH (ref 0.0–2.0)
Bicarbonate: 26.8 mmol/L (ref 20.0–28.0)
Drawn by: 331471
O2 Content: 2 L/min
O2 Saturation: 97.9 %
Patient temperature: 98.6
pCO2 arterial: 38.5 mmHg (ref 32.0–48.0)
pH, Arterial: 7.456 — ABNORMAL HIGH (ref 7.350–7.450)
pO2, Arterial: 101 mmHg (ref 83.0–108.0)

## 2016-07-05 LAB — URINALYSIS, ROUTINE W REFLEX MICROSCOPIC
Bilirubin Urine: NEGATIVE
Glucose, UA: NEGATIVE mg/dL
Hgb urine dipstick: NEGATIVE
Ketones, ur: NEGATIVE mg/dL
Leukocytes, UA: NEGATIVE
Nitrite: NEGATIVE
Protein, ur: NEGATIVE mg/dL
Specific Gravity, Urine: 1.019 (ref 1.005–1.030)
pH: 6 (ref 5.0–8.0)

## 2016-07-05 LAB — PROTIME-INR
INR: 5.47
Prothrombin Time: 51.4 seconds — ABNORMAL HIGH (ref 11.4–15.2)

## 2016-07-05 LAB — BRAIN NATRIURETIC PEPTIDE: B Natriuretic Peptide: 458.7 pg/mL — ABNORMAL HIGH (ref 0.0–100.0)

## 2016-07-05 NOTE — ED Provider Notes (Signed)
[place] DEPT Provider Note   CSN: [id number] Arrival date & time: 07/05/16  [id number]     History   Chief Complaint Chief Complaint  Patient presents with  . Shortness of Breath    HPI [name] is a 82 y.o. female.  HPI Patient presents to the emergency department with shortness of breath and weakness.  The patient's having ongoing issues with this shortness of breath over the last few weeks.  Patient was seen by her doctor and placed on antibiotics and steroids for her COPD issue.  The daughter states that starting on Friday she started having some increased shortness of breath and weaknessThe patient denies chest pain,  headache,blurred vision, neck pain, fever, cough,  numbness, dizziness, anorexia, edema, abdominal pain, nausea, vomiting, diarrhea, rash, back pain, dysuria, hematemesis, bloody stool, near syncope, or syncope. Past Medical History:  Diagnosis Date  . Atrial fibrillation ([place])   . COPD (chronic obstructive pulmonary disease) ([place])   . Fall   . Glaucoma   . Hypothyroidism   . Neuropathy ([facility])    PERIPHERAL  . Osteoarthritis   . Osteoporosis   . Trimalleolar fracture    LEFT TRIMALLEOLAR FRACTURE, DISLOCATION WITH OBLIQUE FRACTURE OF THE DISTAL FIBULAR DIAPHYSIS, AND NOTED BEING MILDLY COMMINUTED    Patient Active Problem List   Diagnosis Date Noted  . Atrial fibrillation ([place])   . Hypothyroidism   . Glaucoma   . Neuropathy ([place])   . Osteoporosis   . Osteoarthritis   . Fall   . Trimalleolar fracture   . HYPOTHYROIDISM 03/02/2007  . GLAUCOMA 03/02/2007  . ATRIAL FIBRILLATION 03/02/2007  . PSORIASIS 03/02/2007    Past Surgical History:  Procedure Laterality Date  . ORIF TIBIA & FIBULA FRACTURES    . VAGINAL HYSTERECTOMY      OB History    No data available       Home Medications    Prior to Admission medications   Medication Sig Start Date End Date Taking? Authorizing Provider  acetaminophen (TYLENOL) 500 MG tablet Take 500 mg  by mouth every 6 (six) hours as needed for mild pain.   Yes Historical Provider, MD  albuterol (PROVENTIL) (2.5 MG/3ML) 0.083% nebulizer solution Take 2.5 mg by nebulization every 6 (six) hours as needed for wheezing or shortness of breath.   Yes Historical Provider, MD  dorzolamide-timolol (COSOPT) 22.3-6.8 MG/ML ophthalmic solution Place 1 drop into the left eye daily.    Yes Historical Provider, MD  ergocalciferol (VITAMIN D2) 50000 UNITS capsule Take 50,000 Units by mouth once a week. thursday   Yes Historical Provider, MD  FLUoxetine (PROZAC) 20 MG tablet Take 20 mg by mouth daily.     Yes Historical Provider, MD  furosemide (LASIX) 20 MG tablet Take 1 tablet (20 mg total) by mouth 2 (two) times daily. Patient taking differently: Take 20 mg by mouth 2 (two) times daily as needed for fluid or edema.  07/01/16  Yes Virgel Manifold, MD  gabapentin (NEURONTIN) 600 MG tablet Take 600 mg by mouth at bedtime. 03/27/15  Yes Historical Provider, MD  levothyroxine (SYNTHROID, LEVOTHROID) 88 MCG tablet Take 88 mcg by mouth daily before breakfast.   Yes Historical Provider, MD  lisinopril (PRINIVIL,ZESTRIL) 5 MG tablet Take 5 mg by mouth daily. 06/12/16  Yes Historical Provider, MD  methocarbamol (ROBAXIN) 500 MG tablet Take 500 mg by mouth 2 (two) times daily.   Yes Historical Provider, MD  metoprolol tartrate (LOPRESSOR) 25 MG tablet Take 1 tablet (25 mg  total) by mouth 2 (two) times daily. Patient taking differently: Take 12.5 mg by mouth 2 (two) times daily.  12/29/10 07/05/17 Yes Peter M Martinique, MD  Misc Natural Products (LUTEIN 20 PO) Take 20 mg by mouth daily.   Yes Historical Provider, MD  omeprazole (PRILOSEC) 40 MG capsule Take 40 mg by mouth daily.   Yes Historical Provider, MD  oxyCODONE-acetaminophen (PERCOCET/ROXICET) 5-325 MG per tablet Take 1-2 tablets by mouth every 4 (four) hours as needed for pain. Patient taking differently: Take 1 tablet by mouth every 8 (eight) hours as needed for moderate  pain.  05/24/12  Yes John-Adam Bonk, MD  pilocarpine (PILOCAR) 4 % ophthalmic solution Place 1 drop into the left eye 2 (two) times daily.    Yes Historical Provider, MD  PROMETHAZINE-DM PO Take 2.5 mg by mouth at bedtime as needed (cough).   Yes Historical Provider, MD  spironolactone (ALDACTONE) 25 MG tablet Take 25 mg by mouth daily.   Yes Historical Provider, MD  travoprost, benzalkonium, (TRAVATAN) 0.004 % ophthalmic solution Place 1 drop into the left eye at bedtime.    Yes Historical Provider, MD  warfarin (COUMADIN) 3 MG tablet Take 1.5-3 mg by mouth daily. 3 mg on Mon and Friday and 1.5 mg on all other days.   Yes Historical Provider, MD  azithromycin (ZITHROMAX) 250 MG tablet Take 1 tablet (250 mg total) by mouth daily. Take first 2 tablets together, then 1 every day until finished. Patient not taking: Reported on 07/05/2016 04/30/15   Forde Dandy, MD  doxycycline (VIBRAMYCIN) 100 MG capsule Take 100 mg by mouth 2 (two) times daily. 06/15/16   Historical Provider, MD  HYDROcodone-acetaminophen (NORCO/VICODIN) 5-325 MG tablet Take 1 tablet by mouth every 6 (six) hours as needed for severe pain. Patient not taking: Reported on 07/05/2016 08/01/15   Leo Grosser, MD  potassium chloride (K-DUR) 10 MEQ tablet Take 1 tablet (10 mEq total) by mouth 2 (two) times daily. Patient not taking: Reported on 07/05/2016 07/01/16   Virgel Manifold, MD  predniSONE (DELTASONE) 20 MG tablet Take 60 mg daily x 2 days then 40 mg daily x 2 days then 20 mg daily x 2 days Patient not taking: Reported on 07/05/2016 05/07/15   Drenda Freeze, MD    Family History History reviewed. No pertinent family history.  Social History Social History  Substance Use Topics  . Smoking status: Never Smoker  . Smokeless tobacco: Never Used  . Alcohol use No     Allergies   Patient has no known allergies.   Review of Systems Review of Systems All other systems negative except as documented in the HPI. All pertinent  positives and negatives as reviewed in the HPI.  Physical Exam Updated Vital Signs BP 127/80   Pulse 74   Temp 97.4 F (36.3 C) (Oral)   Resp 24   Ht 5\' 7"  (1.702 m)   Wt 62.6 kg   SpO2 97%   BMI 21.61 kg/m   Physical Exam  Constitutional: She is oriented to person, place, and time. She appears well-developed and well-nourished. No distress.  HENT:  Head: Normocephalic and atraumatic.  Mouth/Throat: Oropharynx is clear and moist.  Eyes: Pupils are equal, round, and reactive to light.  Neck: Normal range of motion. Neck supple.  Cardiovascular: Normal rate, regular rhythm and normal heart sounds.  Exam reveals no gallop and no friction rub.   No murmur heard. Pulmonary/Chest: Effort normal and breath sounds normal. No respiratory distress. She  has no wheezes.  Abdominal: Soft. Bowel sounds are normal. She exhibits no distension. There is no tenderness.  Neurological: She is alert and oriented to person, place, and time. She exhibits normal muscle tone. Coordination normal.  Skin: Skin is warm and dry. No rash noted. No erythema.  Psychiatric: She has a normal mood and affect. Her behavior is normal.  Nursing note and vitals reviewed.    ED Treatments / Results  Labs (all labs ordered are listed, but only abnormal results are displayed) Labs Reviewed  BASIC METABOLIC PANEL - Abnormal; Notable for the following:       Result Value   Creatinine, Ser 1.05 (*)    GFR calc non Af Amer 46 (*)    GFR calc Af Amer 54 (*)    All other components within normal limits  CBC WITH DIFFERENTIAL/PLATELET - Abnormal; Notable for the following:    RBC 3.79 (*)    Hemoglobin 11.6 (*)    HCT 34.9 (*)    Lymphs Abs 0.4 (*)    All other components within normal limits  BRAIN NATRIURETIC PEPTIDE - Abnormal; Notable for the following:    B Natriuretic Peptide 458.7 (*)    All other components within normal limits  BLOOD GAS, ARTERIAL - Abnormal; Notable for the following:    pH, Arterial  7.456 (*)    Acid-Base Excess 3.2 (*)    All other components within normal limits  TROPONIN I  URINALYSIS, ROUTINE W REFLEX MICROSCOPIC (NOT AT [facility])  PROTIME-INR    EKG  EKG Interpretation  Date/Time:  Sunday July 05 2016 10:13:52 EST Ventricular Rate:  78 PR Interval:    QRS Duration: 92 QT Interval:  357 QTC Calculation: 407 R Axis:   18 Text Interpretation:  Atrial fibrillation Nonspecific repol abnormality, diffuse leads No significant change since last tracing Confirmed by Wilson Singer  MD, STEPHEN [phone]) on 07/05/2016 10:16:03 AM       Radiology Dg Chest 2 View  Result Date: 07/05/2016 CLINICAL DATA:  Shortness of breath for 1 week. EXAM: CHEST  2 VIEW COMPARISON:  07/01/2016 FINDINGS: Cardiomegaly. No overt edema. No confluent opacities or effusions. Severe compression fracture in the lower thoracic spine, stable. IMPRESSION: Cardiomegaly.  No active disease. Electronically Signed   By: Rolm Baptise M.D.   On: 07/05/2016 13:47    Procedures Procedures (including critical care time)  Medications Ordered in ED Medications - No data to display   Initial Impression / Assessment and Plan / ED Course  I have reviewed the triage vital signs and the nursing notes.  Pertinent labs & imaging results that were available during my care of the patient were reviewed by me and considered in my medical decision making (see chart for details).  Clinical Course     Patient was followed over several hours in the emergency department.  She was removed from the nasal cannula oxygen and her condition seemed to stabilize and improve here in the department.  The patient is referred back to her primary care doctor, told to return here as needed.  The patient does not ambulate normally.  The patient is advised return here for any worsening in her condition.  The patient and her family agree to the plan and all questions were answered  Final Clinical Impressions(s) / ED Diagnoses   Final  diagnoses:  None    New Prescriptions New Prescriptions   No medications on file     Dalia Heading, PA-C 07/08/16 0046  Virgel Manifold, MD 07/15/16 [phone]

## 2016-07-05 NOTE — Discharge Instructions (Signed)
Return here as needed. Follow up with your PCP tomorrow.

## 2016-07-05 NOTE — ED Triage Notes (Signed)
Per EMS, pt from home, c/o SOB x1 week. Recently dx with pneumonia and completed antibiotics, per daughter. Diminished in lower lobes. Denies pain.

## 2016-07-05 NOTE — ED Notes (Signed)
Primary RN made aware of patient's critical value.

## 2016-07-05 NOTE — ED Notes (Signed)
Bed: WA03 Expected date:  Expected time:  Means of arrival:  Comments: EMS-SOB 

## 2016-07-05 NOTE — ED Notes (Signed)
Pt placed on bedpan but was unable to void at this time. Pt brief was checked for urine but was noted to be dry.

## 2016-07-10 DIAGNOSIS — R0602 Shortness of breath: Secondary | ICD-10-CM | POA: Diagnosis not present

## 2016-07-10 DIAGNOSIS — J449 Chronic obstructive pulmonary disease, unspecified: Secondary | ICD-10-CM | POA: Diagnosis not present

## 2016-07-10 DIAGNOSIS — I5032 Chronic diastolic (congestive) heart failure: Secondary | ICD-10-CM | POA: Diagnosis not present

## 2016-07-10 DIAGNOSIS — R197 Diarrhea, unspecified: Secondary | ICD-10-CM | POA: Diagnosis not present

## 2016-07-10 DIAGNOSIS — R791 Abnormal coagulation profile: Secondary | ICD-10-CM | POA: Diagnosis not present

## 2016-07-15 DIAGNOSIS — R197 Diarrhea, unspecified: Secondary | ICD-10-CM | POA: Diagnosis not present

## 2016-07-22 DIAGNOSIS — I4891 Unspecified atrial fibrillation: Secondary | ICD-10-CM | POA: Diagnosis not present

## 2016-07-22 DIAGNOSIS — G629 Polyneuropathy, unspecified: Secondary | ICD-10-CM | POA: Diagnosis not present

## 2016-07-22 DIAGNOSIS — R197 Diarrhea, unspecified: Secondary | ICD-10-CM | POA: Diagnosis not present

## 2016-07-22 DIAGNOSIS — R791 Abnormal coagulation profile: Secondary | ICD-10-CM | POA: Diagnosis not present

## 2016-07-22 DIAGNOSIS — E039 Hypothyroidism, unspecified: Secondary | ICD-10-CM | POA: Diagnosis not present

## 2016-07-22 DIAGNOSIS — Z79899 Other long term (current) drug therapy: Secondary | ICD-10-CM | POA: Diagnosis not present

## 2016-07-22 DIAGNOSIS — G8929 Other chronic pain: Secondary | ICD-10-CM | POA: Diagnosis not present

## 2016-07-24 DIAGNOSIS — R791 Abnormal coagulation profile: Secondary | ICD-10-CM | POA: Diagnosis not present

## 2016-07-31 DIAGNOSIS — Z7901 Long term (current) use of anticoagulants: Secondary | ICD-10-CM | POA: Diagnosis not present

## 2016-08-04 DIAGNOSIS — R0602 Shortness of breath: Secondary | ICD-10-CM | POA: Diagnosis not present

## 2016-08-06 DIAGNOSIS — R791 Abnormal coagulation profile: Secondary | ICD-10-CM | POA: Diagnosis not present

## 2016-09-10 DIAGNOSIS — I4891 Unspecified atrial fibrillation: Secondary | ICD-10-CM | POA: Diagnosis not present

## 2016-09-10 DIAGNOSIS — G894 Chronic pain syndrome: Secondary | ICD-10-CM | POA: Diagnosis not present

## 2016-09-10 DIAGNOSIS — D511 Vitamin B12 deficiency anemia due to selective vitamin B12 malabsorption with proteinuria: Secondary | ICD-10-CM | POA: Diagnosis not present

## 2016-09-14 DIAGNOSIS — R0602 Shortness of breath: Secondary | ICD-10-CM | POA: Diagnosis not present

## 2016-09-14 DIAGNOSIS — J189 Pneumonia, unspecified organism: Secondary | ICD-10-CM | POA: Diagnosis not present

## 2016-09-21 DIAGNOSIS — E860 Dehydration: Secondary | ICD-10-CM | POA: Diagnosis not present

## 2016-09-21 DIAGNOSIS — J441 Chronic obstructive pulmonary disease with (acute) exacerbation: Secondary | ICD-10-CM | POA: Diagnosis not present

## 2016-09-21 DIAGNOSIS — R112 Nausea with vomiting, unspecified: Secondary | ICD-10-CM | POA: Diagnosis not present

## 2016-09-21 DIAGNOSIS — I4891 Unspecified atrial fibrillation: Secondary | ICD-10-CM | POA: Diagnosis not present

## 2016-09-23 DIAGNOSIS — G894 Chronic pain syndrome: Secondary | ICD-10-CM | POA: Diagnosis not present

## 2016-09-23 DIAGNOSIS — D518 Other vitamin B12 deficiency anemias: Secondary | ICD-10-CM | POA: Diagnosis not present

## 2016-09-23 DIAGNOSIS — Z79899 Other long term (current) drug therapy: Secondary | ICD-10-CM | POA: Diagnosis not present

## 2016-09-23 DIAGNOSIS — J189 Pneumonia, unspecified organism: Secondary | ICD-10-CM | POA: Diagnosis not present

## 2016-09-23 DIAGNOSIS — E538 Deficiency of other specified B group vitamins: Secondary | ICD-10-CM | POA: Diagnosis not present

## 2016-09-24 ENCOUNTER — Ambulatory Visit
Admission: RE | Admit: 2016-09-24 | Discharge: 2016-09-24 | Disposition: A | Discharge status: Home / Self Care | Payer: Medicare Other | Source: Ambulatory Visit | Attending: Family Medicine | Admitting: Family Medicine

## 2016-09-24 ENCOUNTER — Other Ambulatory Visit: Payer: Self-pay | Admitting: Family Medicine

## 2016-09-24 DIAGNOSIS — R05 Cough: Secondary | ICD-10-CM

## 2016-09-24 DIAGNOSIS — R059 Cough, unspecified: Secondary | ICD-10-CM

## 2016-09-24 DIAGNOSIS — R0600 Dyspnea, unspecified: Secondary | ICD-10-CM

## 2016-09-24 DIAGNOSIS — R0602 Shortness of breath: Secondary | ICD-10-CM | POA: Diagnosis not present

## 2016-10-03 ENCOUNTER — Emergency Department (HOSPITAL_COMMUNITY): Payer: Medicare Other

## 2016-10-03 ENCOUNTER — Inpatient Hospital Stay (HOSPITAL_COMMUNITY)
Admission: EM | Admit: 2016-10-03 | Discharge: 2016-10-15 | DRG: 871 | Disposition: A | Discharge status: Skilled Nursing Facility | Payer: Medicare Other | Attending: Internal Medicine | Admitting: Internal Medicine

## 2016-10-03 ENCOUNTER — Encounter (HOSPITAL_COMMUNITY): Payer: Self-pay | Admitting: Emergency Medicine

## 2016-10-03 DIAGNOSIS — E876 Hypokalemia: Secondary | ICD-10-CM | POA: Diagnosis present

## 2016-10-03 DIAGNOSIS — I5021 Acute systolic (congestive) heart failure: Secondary | ICD-10-CM | POA: Diagnosis present

## 2016-10-03 DIAGNOSIS — Z515 Encounter for palliative care: Secondary | ICD-10-CM | POA: Diagnosis not present

## 2016-10-03 DIAGNOSIS — I130 Hypertensive heart and chronic kidney disease with heart failure and stage 1 through stage 4 chronic kidney disease, or unspecified chronic kidney disease: Secondary | ICD-10-CM | POA: Diagnosis present

## 2016-10-03 DIAGNOSIS — J9601 Acute respiratory failure with hypoxia: Secondary | ICD-10-CM | POA: Diagnosis not present

## 2016-10-03 DIAGNOSIS — I420 Dilated cardiomyopathy: Secondary | ICD-10-CM | POA: Diagnosis not present

## 2016-10-03 DIAGNOSIS — H409 Unspecified glaucoma: Secondary | ICD-10-CM | POA: Diagnosis present

## 2016-10-03 DIAGNOSIS — Z66 Do not resuscitate: Secondary | ICD-10-CM | POA: Diagnosis present

## 2016-10-03 DIAGNOSIS — F05 Delirium due to known physiological condition: Secondary | ICD-10-CM | POA: Diagnosis not present

## 2016-10-03 DIAGNOSIS — R0602 Shortness of breath: Secondary | ICD-10-CM | POA: Diagnosis not present

## 2016-10-03 DIAGNOSIS — N183 Chronic kidney disease, stage 3 (moderate): Secondary | ICD-10-CM | POA: Diagnosis present

## 2016-10-03 DIAGNOSIS — J189 Pneumonia, unspecified organism: Secondary | ICD-10-CM | POA: Diagnosis not present

## 2016-10-03 DIAGNOSIS — J440 Chronic obstructive pulmonary disease with acute lower respiratory infection: Secondary | ICD-10-CM | POA: Diagnosis present

## 2016-10-03 DIAGNOSIS — Z452 Encounter for adjustment and management of vascular access device: Secondary | ICD-10-CM | POA: Diagnosis not present

## 2016-10-03 DIAGNOSIS — I482 Chronic atrial fibrillation: Secondary | ICD-10-CM | POA: Diagnosis not present

## 2016-10-03 DIAGNOSIS — E039 Hypothyroidism, unspecified: Secondary | ICD-10-CM | POA: Diagnosis present

## 2016-10-03 DIAGNOSIS — F329 Major depressive disorder, single episode, unspecified: Secondary | ICD-10-CM | POA: Diagnosis present

## 2016-10-03 DIAGNOSIS — J441 Chronic obstructive pulmonary disease with (acute) exacerbation: Secondary | ICD-10-CM | POA: Diagnosis not present

## 2016-10-03 DIAGNOSIS — M810 Age-related osteoporosis without current pathological fracture: Secondary | ICD-10-CM | POA: Diagnosis present

## 2016-10-03 DIAGNOSIS — D649 Anemia, unspecified: Secondary | ICD-10-CM

## 2016-10-03 DIAGNOSIS — R531 Weakness: Secondary | ICD-10-CM

## 2016-10-03 DIAGNOSIS — R41841 Cognitive communication deficit: Secondary | ICD-10-CM | POA: Diagnosis not present

## 2016-10-03 DIAGNOSIS — I4891 Unspecified atrial fibrillation: Secondary | ICD-10-CM | POA: Diagnosis present

## 2016-10-03 DIAGNOSIS — I959 Hypotension, unspecified: Secondary | ICD-10-CM | POA: Diagnosis present

## 2016-10-03 DIAGNOSIS — R404 Transient alteration of awareness: Secondary | ICD-10-CM | POA: Diagnosis not present

## 2016-10-03 DIAGNOSIS — T502X5A Adverse effect of carbonic-anhydrase inhibitors, benzothiadiazides and other diuretics, initial encounter: Secondary | ICD-10-CM | POA: Diagnosis present

## 2016-10-03 DIAGNOSIS — E860 Dehydration: Secondary | ICD-10-CM | POA: Diagnosis present

## 2016-10-03 DIAGNOSIS — J181 Lobar pneumonia, unspecified organism: Secondary | ICD-10-CM | POA: Diagnosis present

## 2016-10-03 DIAGNOSIS — R1310 Dysphagia, unspecified: Secondary | ICD-10-CM | POA: Diagnosis not present

## 2016-10-03 DIAGNOSIS — R0600 Dyspnea, unspecified: Secondary | ICD-10-CM

## 2016-10-03 DIAGNOSIS — Z7901 Long term (current) use of anticoagulants: Secondary | ICD-10-CM

## 2016-10-03 DIAGNOSIS — R54 Age-related physical debility: Secondary | ICD-10-CM | POA: Diagnosis present

## 2016-10-03 DIAGNOSIS — R05 Cough: Secondary | ICD-10-CM | POA: Diagnosis not present

## 2016-10-03 DIAGNOSIS — I5032 Chronic diastolic (congestive) heart failure: Secondary | ICD-10-CM | POA: Diagnosis present

## 2016-10-03 DIAGNOSIS — A419 Sepsis, unspecified organism: Principal | ICD-10-CM | POA: Diagnosis present

## 2016-10-03 DIAGNOSIS — I110 Hypertensive heart disease with heart failure: Secondary | ICD-10-CM | POA: Diagnosis present

## 2016-10-03 DIAGNOSIS — K224 Dyskinesia of esophagus: Secondary | ICD-10-CM | POA: Diagnosis present

## 2016-10-03 DIAGNOSIS — R0603 Acute respiratory distress: Secondary | ICD-10-CM

## 2016-10-03 DIAGNOSIS — R64 Cachexia: Secondary | ICD-10-CM | POA: Diagnosis present

## 2016-10-03 DIAGNOSIS — R278 Other lack of coordination: Secondary | ICD-10-CM | POA: Diagnosis not present

## 2016-10-03 DIAGNOSIS — E778 Other disorders of glycoprotein metabolism: Secondary | ICD-10-CM | POA: Diagnosis present

## 2016-10-03 DIAGNOSIS — I429 Cardiomyopathy, unspecified: Secondary | ICD-10-CM

## 2016-10-03 DIAGNOSIS — G9340 Encephalopathy, unspecified: Secondary | ICD-10-CM | POA: Diagnosis not present

## 2016-10-03 DIAGNOSIS — I5043 Acute on chronic combined systolic (congestive) and diastolic (congestive) heart failure: Secondary | ICD-10-CM | POA: Diagnosis present

## 2016-10-03 DIAGNOSIS — I5033 Acute on chronic diastolic (congestive) heart failure: Secondary | ICD-10-CM | POA: Diagnosis not present

## 2016-10-03 DIAGNOSIS — M6281 Muscle weakness (generalized): Secondary | ICD-10-CM | POA: Diagnosis not present

## 2016-10-03 DIAGNOSIS — G894 Chronic pain syndrome: Secondary | ICD-10-CM | POA: Diagnosis present

## 2016-10-03 DIAGNOSIS — M1990 Unspecified osteoarthritis, unspecified site: Secondary | ICD-10-CM | POA: Diagnosis present

## 2016-10-03 DIAGNOSIS — Z7189 Other specified counseling: Secondary | ICD-10-CM | POA: Diagnosis not present

## 2016-10-03 DIAGNOSIS — I472 Ventricular tachycardia: Secondary | ICD-10-CM | POA: Diagnosis not present

## 2016-10-03 DIAGNOSIS — E46 Unspecified protein-calorie malnutrition: Secondary | ICD-10-CM | POA: Diagnosis not present

## 2016-10-03 DIAGNOSIS — R918 Other nonspecific abnormal finding of lung field: Secondary | ICD-10-CM | POA: Diagnosis not present

## 2016-10-03 DIAGNOSIS — J449 Chronic obstructive pulmonary disease, unspecified: Secondary | ICD-10-CM | POA: Diagnosis present

## 2016-10-03 DIAGNOSIS — I480 Paroxysmal atrial fibrillation: Secondary | ICD-10-CM | POA: Diagnosis not present

## 2016-10-03 DIAGNOSIS — Z8249 Family history of ischemic heart disease and other diseases of the circulatory system: Secondary | ICD-10-CM

## 2016-10-03 DIAGNOSIS — J9600 Acute respiratory failure, unspecified whether with hypoxia or hypercapnia: Secondary | ICD-10-CM | POA: Diagnosis not present

## 2016-10-03 DIAGNOSIS — Z87891 Personal history of nicotine dependence: Secondary | ICD-10-CM

## 2016-10-03 DIAGNOSIS — Z993 Dependence on wheelchair: Secondary | ICD-10-CM

## 2016-10-03 DIAGNOSIS — I509 Heart failure, unspecified: Secondary | ICD-10-CM | POA: Diagnosis not present

## 2016-10-03 DIAGNOSIS — G629 Polyneuropathy, unspecified: Secondary | ICD-10-CM | POA: Diagnosis present

## 2016-10-03 DIAGNOSIS — R1314 Dysphagia, pharyngoesophageal phase: Secondary | ICD-10-CM | POA: Diagnosis not present

## 2016-10-03 DIAGNOSIS — Z7951 Long term (current) use of inhaled steroids: Secondary | ICD-10-CM

## 2016-10-03 DIAGNOSIS — N179 Acute kidney failure, unspecified: Secondary | ICD-10-CM | POA: Diagnosis not present

## 2016-10-03 DIAGNOSIS — Z6820 Body mass index (BMI) 20.0-20.9, adult: Secondary | ICD-10-CM

## 2016-10-03 LAB — DIFFERENTIAL
BASOS ABS: 0 10*3/uL (ref 0.0–0.1)
BASOS PCT: 0 %
EOS ABS: 0.1 10*3/uL (ref 0.0–0.7)
EOS PCT: 1 %
LYMPHS ABS: 0.7 10*3/uL (ref 0.7–4.0)
Lymphocytes Relative: 9 %
MONOS PCT: 10 %
Monocytes Absolute: 0.8 10*3/uL (ref 0.1–1.0)
Neutro Abs: 6.3 10*3/uL (ref 1.7–7.7)
Neutrophils Relative %: 80 %

## 2016-10-03 LAB — I-STAT TROPONIN, ED: Troponin i, poc: 0.01 ng/mL (ref 0.00–0.08)

## 2016-10-03 LAB — CBC
HEMATOCRIT: 30.5 % — AB (ref 36.0–46.0)
HEMOGLOBIN: 10.1 g/dL — AB (ref 12.0–15.0)
MCH: 31 pg (ref 26.0–34.0)
MCHC: 33.1 g/dL (ref 30.0–36.0)
MCV: 93.6 fL (ref 78.0–100.0)
Platelets: 188 10*3/uL (ref 150–400)
RBC: 3.26 MIL/uL — ABNORMAL LOW (ref 3.87–5.11)
RDW: 14.6 % (ref 11.5–15.5)
WBC: 8 10*3/uL (ref 4.0–10.5)

## 2016-10-03 LAB — BASIC METABOLIC PANEL
ANION GAP: 11 (ref 5–15)
BUN: 18 mg/dL (ref 6–20)
CALCIUM: 8.5 mg/dL — AB (ref 8.9–10.3)
CO2: 32 mmol/L (ref 22–32)
Chloride: 93 mmol/L — ABNORMAL LOW (ref 101–111)
Creatinine, Ser: 1.62 mg/dL — ABNORMAL HIGH (ref 0.44–1.00)
GFR calc Af Amer: 32 mL/min — ABNORMAL LOW (ref >60)
GFR, EST NON AFRICAN AMERICAN: 27 mL/min — AB (ref >60)
GLUCOSE: 88 mg/dL (ref 65–99)
Potassium: 2.5 mmol/L — CL (ref 3.5–5.1)
Sodium: 136 mmol/L (ref 135–145)

## 2016-10-03 LAB — PROTIME-INR
INR: 3.23
Prothrombin Time: 33.7 seconds — ABNORMAL HIGH (ref 11.4–15.2)

## 2016-10-03 LAB — MAGNESIUM: MAGNESIUM: 1.8 mg/dL (ref 1.7–2.4)

## 2016-10-03 LAB — BRAIN NATRIURETIC PEPTIDE: B Natriuretic Peptide: 459.3 pg/mL — ABNORMAL HIGH (ref 0.0–100.0)

## 2016-10-03 MED ORDER — DEXTROSE 5 % IV SOLN
500.0000 mg | Freq: Once | INTRAVENOUS | Status: AC
Start: 2016-10-03 — End: 2016-10-03
  Administered 2016-10-03: 500 mg via INTRAVENOUS
  Filled 2016-10-03: qty 500

## 2016-10-03 MED ORDER — DEXTROSE 5 % IV SOLN
1.0000 g | Freq: Once | INTRAVENOUS | Status: AC
  Administered 2016-10-03: 1 g via INTRAVENOUS
  Filled 2016-10-03: qty 10

## 2016-10-03 MED ORDER — SODIUM CHLORIDE 0.9 % IV SOLN
30.0000 meq | Freq: Once | INTRAVENOUS | Status: AC
  Administered 2016-10-03: 30 meq via INTRAVENOUS
  Filled 2016-10-03: qty 15

## 2016-10-03 MED ORDER — SODIUM CHLORIDE 0.9 % IV BOLUS (SEPSIS)
1000.0000 mL | Freq: Once | INTRAVENOUS | Status: AC
  Administered 2016-10-03: 1000 mL via INTRAVENOUS

## 2016-10-03 MED ORDER — POTASSIUM CHLORIDE CRYS ER 20 MEQ PO TBCR
40.0000 meq | EXTENDED_RELEASE_TABLET | Freq: Once | ORAL | Status: AC
  Administered 2016-10-03: 40 meq via ORAL
  Filled 2016-10-03: qty 2

## 2016-10-03 NOTE — ED Notes (Signed)
Patient transported to X-ray 

## 2016-10-03 NOTE — ED Triage Notes (Signed)
Pt presents via EMS for cough and progressive weakness. Was DX with [facility] 10 days ago per PCP. Finished ABX today but still having a productive cough and still getting weak. A+O per baseline. Pt uses a power chair at home per baseline

## 2016-10-03 NOTE — ED Notes (Signed)
Consulting MD at bedside

## 2016-10-03 NOTE — ED Provider Notes (Signed)
[facility] DEPT Provider Note   CSN: [id number] Arrival date & time: 10/03/16  [id number]     History   Chief Complaint Chief Complaint  Patient presents with  . Weakness    HPI [name] is a 83 y.o. female.  HPI 83 year old female who presents with generalized weakness. History is primarily provided by patient's daughter. She has a history of atrial fibrillation on Coumadin, COPD, hypothyroidism, and hypertension. Her daughter states that over the past 3 days she has not been fully herself. Has been more weak. Normally does not walk, but is able to transfer and use the bathroom, which she has not been able to do. Has been more agitated recently as well. Has not had fever or chills, but has been having cough productive of clear sputum. Has had decreased appetite and not eating and drinking as normally. She has had mild swelling of the left upper extremity but no pain. No lower extremity edema. Denies any chest pain. Did have one episode of vomiting with coughing profusely once. No diarrhea or abdominal pain.   Past Medical History:  Diagnosis Date  . Atrial fibrillation ([place])   . COPD (chronic obstructive pulmonary disease) ([place])   . Fall   . Glaucoma   . Hypothyroidism   . Neuropathy (H[facility])    PERIPHERAL  . Osteoarthritis   . Osteoporosis   . Trimalleolar fracture    LEFT TRIMALLEOLAR FRACTURE, DISLOCATION WITH OBLIQUE FRACTURE OF THE DISTAL FIBULAR DIAPHYSIS, AND NOTED BEING MILDLY COMMINUTED    Patient Active Problem List   Diagnosis Date Noted  . CAP (community acquired pneumonia) 10/03/2016  . AKI (acute kidney injury) ([place]) 10/03/2016  . Hypokalemia 10/03/2016  . Normocytic anemia 10/03/2016  . Acute respiratory failure with hypoxia ([place]) 10/03/2016  . Chronic diastolic CHF (congestive heart failure) ([place]) 10/03/2016  . Hypotension 10/03/2016  . Atrial fibrillation ([place])   . Hypothyroidism   . Glaucoma   . Neuropathy ([place])   . Osteoporosis   .  Osteoarthritis   . Fall   . Trimalleolar fracture   . HYPOTHYROIDISM 03/02/2007  . GLAUCOMA 03/02/2007  . ATRIAL FIBRILLATION 03/02/2007  . PSORIASIS 03/02/2007    Past Surgical History:  Procedure Laterality Date  . ORIF TIBIA & FIBULA FRACTURES    . VAGINAL HYSTERECTOMY      OB History    No data available       Home Medications    Prior to Admission medications   Medication Sig Start Date End Date Taking? Authorizing Provider  acetaminophen (TYLENOL) 325 MG tablet Take 325 mg by mouth daily as needed for headache (pain).   Yes Historical Provider, MD  albuterol (PROVENTIL) (2.5 MG/3ML) 0.083% nebulizer solution Take 2.5 mg by nebulization every 4 (four) hours as needed for wheezing or shortness of breath.    Yes Historical Provider, MD  benzonatate (TESSALON) 100 MG capsule Take 100 mg by mouth 3 (three) times daily. 09/28/16  Yes Historical Provider, MD  budesonide (PULMICORT) 0.5 MG/2ML nebulizer solution Take 0.5 mg by nebulization 2 (two) times daily. 09/14/16  Yes Historical Provider, MD  Dextromethorphan-Guaifenesin (MUCINEX DM PO) Take 10 mLs by mouth every 12 (twelve) hours.   Yes Historical Provider, MD  dorzolamide (TRUSOPT) 2 % ophthalmic solution Place 1 drop into the left eye 2 (two) times daily. 09/11/16  Yes Historical Provider, MD  ergocalciferol (VITAMIN D2) 50000 UNITS capsule Take 50,000 Units by mouth every Thursday. thursday   Yes Historical Provider, MD  FLUoxetine (PROZAC) 20  MG capsule Take 20 mg by mouth daily. 09/11/16  Yes Historical Provider, MD  furosemide (LASIX) 20 MG tablet Take 1 tablet (20 mg total) by mouth 2 (two) times daily. Patient taking differently: Take 20-40 mg by mouth See admin instructions. Take 1 tablet (20 mg) by mouth every morning and 2 tablets (40 mg) at 2pm 07/01/16  Yes Virgel Manifold, MD  gabapentin (NEURONTIN) 600 MG tablet Take 600 mg by mouth at bedtime. 03/27/15  Yes Historical Provider, MD  levofloxacin (LEVAQUIN) 500 MG  tablet Take 500 mg by mouth daily. 10 day course to be completed 10/04/16 09/28/16  Yes Historical Provider, MD  levothyroxine (SYNTHROID, LEVOTHROID) 75 MCG tablet Take 75 mcg by mouth daily before breakfast.   Yes Historical Provider, MD  lisinopril (PRINIVIL,ZESTRIL) 5 MG tablet Take 5 mg by mouth daily. 06/12/16  Yes Historical Provider, MD  loperamide (IMODIUM) 2 MG capsule Take 2-4 mg by mouth See admin instructions. Take 2 capsules (4 mg) by mouth every morning, may also take 1 capsule (2 mg) during the day as needed for loose stools   Yes Historical Provider, MD  LUTEIN-ZEAXANTHIN PO Take 1 tablet by mouth daily with supper.   Yes Historical Provider, MD  methocarbamol (ROBAXIN) 500 MG tablet Take 500 mg by mouth 2 (two) times daily.   Yes Historical Provider, MD  metoprolol tartrate (LOPRESSOR) 25 MG tablet Take 1 tablet (25 mg total) by mouth 2 (two) times daily. Patient taking differently: Take 12.5 mg by mouth 2 (two) times daily.  12/29/10 07/05/17 Yes Peter M Martinique, MD  omeprazole (PRILOSEC) 40 MG capsule Take 40 mg by mouth daily.   Yes Historical Provider, MD  ondansetron (ZOFRAN-ODT) 4 MG disintegrating tablet Take 4 mg by mouth every 8 (eight) hours as needed for nausea or vomiting.  09/23/16  Yes Historical Provider, MD  oxyCODONE-acetaminophen (PERCOCET/ROXICET) 5-325 MG per tablet Take 1-2 tablets by mouth every 4 (four) hours as needed for pain. Patient taking differently: Take 1 tablet by mouth 2 (two) times daily.  05/24/12  Yes John-Adam Bonk, MD  pilocarpine (PILOCAR) 4 % ophthalmic solution Place 1 drop into the left eye 2 (two) times daily.    Yes Historical Provider, MD  spironolactone (ALDACTONE) 25 MG tablet Take 25 mg by mouth daily.   Yes Historical Provider, MD  timolol (TIMOPTIC) 0.5 % ophthalmic solution Place 1 drop into the left eye 2 (two) times daily. 09/11/16  Yes Historical Provider, MD  travoprost, benzalkonium, (TRAVATAN) 0.004 % ophthalmic solution Place 1 drop  into the left eye at bedtime.    Yes Historical Provider, MD  warfarin (COUMADIN) 2 MG tablet Take 1 mg by mouth daily with supper.   Yes Historical Provider, MD  HYDROcodone-acetaminophen (NORCO/VICODIN) 5-325 MG tablet Take 1 tablet by mouth every 6 (six) hours as needed for severe pain. Patient not taking: Reported on 07/05/2016 08/01/15   Leo Grosser, MD  potassium chloride (K-DUR) 10 MEQ tablet Take 1 tablet (10 mEq total) by mouth 2 (two) times daily. Patient not taking: Reported on 07/05/2016 07/01/16   Virgel Manifold, MD    Family History History reviewed. No pertinent family history.  Social History Social History  Substance Use Topics  . Smoking status: Never Smoker  . Smokeless tobacco: Never Used  . Alcohol use No     Allergies   Patient has no known allergies.   Review of Systems Review of Systems 10/14 systems reviewed and are negative other than those stated in the  HPI   Physical Exam Updated Vital Signs BP (!) 89/50   Pulse 80   Temp 97.9 F (36.6 C) (Oral)   Resp 22   SpO2 (!) 89%   Physical Exam Physical Exam  Nursing note and vitals reviewed. Constitutional: Elderly appearing woman,  non-toxic, and in no acute distress Head: Normocephalic and atraumatic.  Mouth/Throat: Oropharynx is clear and moist.  Neck: Normal range of motion. Neck supple.  Cardiovascular: normal rate and irregularly irregular rhythm.   some edema in the left upper extremity. No lower extremity edema Pulmonary/Chest: Effort normal. Speaks in full sentences. Diminished breath sounds with basilar crackles Abdominal: Soft. There is no tenderness. There is no rebound and no guarding.  Musculoskeletal: No deformities  Neurological: Alert, no facial droop, fluent speech, moves all extremities symmetrically Skin: Skin is warm and dry.  Psychiatric: Cooperative   ED Treatments / Results  Labs (all labs ordered are listed, but only abnormal results are displayed) Labs Reviewed    BASIC METABOLIC PANEL - Abnormal; Notable for the following:       Result Value   Potassium 2.5 (*)    Chloride 93 (*)    Creatinine, Ser 1.62 (*)    Calcium 8.5 (*)    GFR calc non Af Amer 27 (*)    GFR calc Af Amer 32 (*)    All other components within normal limits  CBC - Abnormal; Notable for the following:    RBC 3.26 (*)    Hemoglobin 10.1 (*)    HCT 30.5 (*)    All other components within normal limits  BRAIN NATRIURETIC PEPTIDE - Abnormal; Notable for the following:    B Natriuretic Peptide 459.3 (*)    All other components within normal limits  PROTIME-INR - Abnormal; Notable for the following:    Prothrombin Time 33.7 (*)    All other components within normal limits  DIFFERENTIAL  MAGNESIUM  URINALYSIS, ROUTINE W REFLEX MICROSCOPIC  I-STAT TROPOININ, ED  I-STAT CG4 LACTIC ACID, ED    EKG  EKG Interpretation  Date/Time:  Saturday October 03 2016 19:33:43 EST Ventricular Rate:  63 PR Interval:    QRS Duration: 101 QT Interval:  519 QTC Calculation: 532 R Axis:   29 Text Interpretation:  Atrial fibrillation Nonspecific repol abnormality, diffuse leads Prolonged QT interval history of atrial fibrillation. ST changes see on prior EKG  Confirmed by Bastian Andreoli MD, Yifan Auker [phone]) on 10/03/2016 7:40:49 PM       Radiology Dg Chest 2 View  Result Date: 10/03/2016 CLINICAL DATA:  83 year old female with history productive cough and increasing weakness. EXAM: CHEST  2 VIEW COMPARISON:  Chest x-ray 04/13/2017. FINDINGS: Increasing opacity in the lingula concerning for airspace consolidation. Small left pleural effusion. Right lung appears clear. No right pleural effusion. No pneumothorax. No definite suspicious appearing pulmonary nodules or masses. No evidence of pulmonary edema. Heart size is mildly enlarged. Upper mediastinal contours are within normal limits. Atherosclerosis in the thoracic aorta. Old compression fracture of a lower thoracic vertebral body (likely T11) with 90%  loss of anterior vertebral body height, similar to the prior examination, resulting in an acute kyphotic deformity in the lower thoracic spine. IMPRESSION: 1. New airspace consolidation in the lingula with small left-sided pleural effusion, concerning for lingular pneumonia. Followup PA and lateral chest X-ray is recommended in 3-4 weeks following trial of antibiotic therapy to ensure resolution and exclude underlying malignancy. 2. Aortic atherosclerosis. 3. Mild cardiomegaly. Electronically Signed   By: Vinnie Langton  M.D.   On: 10/03/2016 20:50    Procedures Procedures (including critical care time) CRITICAL CARE Performed by: Forde Dandy   Total critical care time: 35 minutes  Critical care time was exclusive of separately billable procedures and treating other patients.  Critical care was necessary to treat or prevent imminent or life-threatening deterioration.  Critical care was time spent personally by me on the following activities: management of hypotension and electrolyte derangements, development of treatment plan with patient and/or surrogate as well as nursing, evaluation of patient's response to treatment, examination of patient, obtaining history from patient or surrogate, ordering and performing treatments and interventions, ordering and review of laboratory studies, ordering and review of radiographic studies, pulse oximetry and re-evaluation of patient's condition.  Medications Ordered in ED Medications  potassium chloride 30 mEq in sodium chloride 0.9 % 265 mL (KCL MULTIRUN) IVPB (30 mEq Intravenous Given 10/03/16 2111)  sodium chloride 0.9 % bolus 1,000 mL (not administered)  potassium chloride SA (K-DUR,KLOR-CON) CR tablet 40 mEq (40 mEq Oral Given 10/03/16 2111)  sodium chloride 0.9 % bolus 1,000 mL (1,000 mLs Intravenous New Bag/Given 10/03/16 2221)  azithromycin (ZITHROMAX) 500 mg in dextrose 5 % 250 mL IVPB (500 mg Intravenous New Bag/Given 10/03/16 2221)  cefTRIAXone  (ROCEPHIN) 1 g in dextrose 5 % 50 mL IVPB (1 g Intravenous New Bag/Given 10/03/16 2220)     Initial Impression / Assessment and Plan / ED Course  I have reviewed the triage vital signs and the nursing notes.  Pertinent labs & imaging results that were available during my care of the patient were reviewed by me and considered in my medical decision making (see chart for details).     83 year old female with history of COPD and afib on coumadin who presents with generalized weakness and productive cough. Non-toxic and in no acute distress. Mild hypoxia on room air w/o increased work of breathing and placed on supplemental oxygen.   Work-up concerning for LLL pneumonia likely causing symptoms. No risk factors for HCAP. Covered with ceftriaxone and azithromycin.   Blood work with no significant leukocytosis. Lactic acid pending. Mild AKI noted. Does have hypokalemia which also may play role in weakness, and repleted orally and with IV.   During ED course does develop hypotension with SBP in the 80s. Is mentating normally. Getting bolused IVF, with some improvement. Discussed with Dr. Myna Hidalgo, who will admit to stepdown for ongoing management.  Final Clinical Impressions(s) / ED Diagnoses   Final diagnoses:  Weakness  Lobar pneumonia ([place])  Hypokalemia    New Prescriptions New Prescriptions   No medications on file     Forde Dandy, MD 10/03/16 2349

## 2016-10-04 ENCOUNTER — Encounter (HOSPITAL_COMMUNITY): Payer: Self-pay | Admitting: Family Medicine

## 2016-10-04 ENCOUNTER — Inpatient Hospital Stay (HOSPITAL_COMMUNITY): Payer: Medicare Other

## 2016-10-04 DIAGNOSIS — J449 Chronic obstructive pulmonary disease, unspecified: Secondary | ICD-10-CM | POA: Diagnosis present

## 2016-10-04 DIAGNOSIS — J189 Pneumonia, unspecified organism: Secondary | ICD-10-CM

## 2016-10-04 DIAGNOSIS — R5381 Other malaise: Secondary | ICD-10-CM | POA: Insufficient documentation

## 2016-10-04 DIAGNOSIS — G9340 Encephalopathy, unspecified: Secondary | ICD-10-CM | POA: Diagnosis present

## 2016-10-04 DIAGNOSIS — A419 Sepsis, unspecified organism: Secondary | ICD-10-CM | POA: Diagnosis present

## 2016-10-04 LAB — MRSA PCR SCREENING: MRSA by PCR: NEGATIVE

## 2016-10-04 LAB — CBC WITH DIFFERENTIAL/PLATELET
Basophils Absolute: 0 10*3/uL (ref 0.0–0.1)
Basophils Relative: 0 %
EOS ABS: 0 10*3/uL (ref 0.0–0.7)
EOS PCT: 1 %
HCT: 28.7 % — ABNORMAL LOW (ref 36.0–46.0)
HEMOGLOBIN: 9.2 g/dL — AB (ref 12.0–15.0)
LYMPHS ABS: 0.3 10*3/uL — AB (ref 0.7–4.0)
Lymphocytes Relative: 4 %
MCH: 30.5 pg (ref 26.0–34.0)
MCHC: 32.1 g/dL (ref 30.0–36.0)
MCV: 95 fL (ref 78.0–100.0)
MONO ABS: 0.3 10*3/uL (ref 0.1–1.0)
Monocytes Relative: 4 %
Neutro Abs: 7.7 10*3/uL (ref 1.7–7.7)
Neutrophils Relative %: 91 %
PLATELETS: 162 10*3/uL (ref 150–400)
RBC: 3.02 MIL/uL — ABNORMAL LOW (ref 3.87–5.11)
RDW: 14.8 % (ref 11.5–15.5)
WBC: 8.4 10*3/uL (ref 4.0–10.5)

## 2016-10-04 LAB — RETICULOCYTES
RBC.: 2.97 MIL/uL — ABNORMAL LOW (ref 3.87–5.11)
RETIC COUNT ABSOLUTE: 41.6 10*3/uL (ref 19.0–186.0)
RETIC CT PCT: 1.4 % (ref 0.4–3.1)

## 2016-10-04 LAB — URINALYSIS, ROUTINE W REFLEX MICROSCOPIC
BILIRUBIN URINE: NEGATIVE
Glucose, UA: NEGATIVE mg/dL
HGB URINE DIPSTICK: NEGATIVE
KETONES UR: NEGATIVE mg/dL
Leukocytes, UA: NEGATIVE
NITRITE: NEGATIVE
PROTEIN: NEGATIVE mg/dL
SPECIFIC GRAVITY, URINE: 1.017 (ref 1.005–1.030)
pH: 6 (ref 5.0–8.0)

## 2016-10-04 LAB — BASIC METABOLIC PANEL
Anion gap: 9 (ref 5–15)
BUN: 16 mg/dL (ref 6–20)
CHLORIDE: 102 mmol/L (ref 101–111)
CO2: 27 mmol/L (ref 22–32)
CREATININE: 1.32 mg/dL — AB (ref 0.44–1.00)
Calcium: 7.9 mg/dL — ABNORMAL LOW (ref 8.9–10.3)
GFR calc Af Amer: 41 mL/min — ABNORMAL LOW (ref >60)
GFR calc non Af Amer: 35 mL/min — ABNORMAL LOW (ref >60)
GLUCOSE: 111 mg/dL — AB (ref 65–99)
Potassium: 3.2 mmol/L — ABNORMAL LOW (ref 3.5–5.1)
Sodium: 138 mmol/L (ref 135–145)

## 2016-10-04 LAB — FERRITIN: FERRITIN: 272 ng/mL (ref 11–307)

## 2016-10-04 LAB — PROTIME-INR
INR: 3.44
Prothrombin Time: 35.5 seconds — ABNORMAL HIGH (ref 11.4–15.2)

## 2016-10-04 LAB — FOLATE: Folate: 10.5 ng/mL (ref >5.9)

## 2016-10-04 LAB — VITAMIN B12: VITAMIN B 12: 989 pg/mL — AB (ref 180–914)

## 2016-10-04 LAB — IRON AND TIBC
Iron: 33 ug/dL (ref 28–170)
Saturation Ratios: 17 % (ref 10.4–31.8)
TIBC: 196 ug/dL — ABNORMAL LOW (ref 250–450)
UIBC: 163 ug/dL

## 2016-10-04 LAB — MAGNESIUM: MAGNESIUM: 1.9 mg/dL (ref 1.7–2.4)

## 2016-10-04 LAB — LACTIC ACID, PLASMA
LACTIC ACID, VENOUS: 0.8 mmol/L (ref 0.5–1.9)
LACTIC ACID, VENOUS: 1.1 mmol/L (ref 0.5–1.9)

## 2016-10-04 LAB — TSH: TSH: 3.249 u[IU]/mL (ref 0.350–4.500)

## 2016-10-04 LAB — STREP PNEUMONIAE URINARY ANTIGEN: STREP PNEUMO URINARY ANTIGEN: NEGATIVE

## 2016-10-04 MED ORDER — GABAPENTIN 600 MG PO TABS
600.0000 mg | ORAL_TABLET | Freq: Every day | ORAL | Status: DC
  Administered 2016-10-04: 600 mg via ORAL
  Filled 2016-10-04: qty 1

## 2016-10-04 MED ORDER — LEVOTHYROXINE SODIUM 75 MCG PO TABS
75.0000 ug | ORAL_TABLET | Freq: Every day | ORAL | Status: DC
  Filled 2016-10-04: qty 1

## 2016-10-04 MED ORDER — IPRATROPIUM-ALBUTEROL 0.5-2.5 (3) MG/3ML IN SOLN
3.0000 mL | Freq: Four times a day (QID) | RESPIRATORY_TRACT | Status: DC
  Administered 2016-10-04 (×3): 3 mL via RESPIRATORY_TRACT
  Filled 2016-10-04 (×4): qty 3

## 2016-10-04 MED ORDER — TIMOLOL MALEATE 0.5 % OP SOLN
1.0000 [drp] | Freq: Two times a day (BID) | OPHTHALMIC | Status: DC
  Administered 2016-10-04 – 2016-10-15 (×24): 1 [drp] via OPHTHALMIC
  Filled 2016-10-04: qty 5

## 2016-10-04 MED ORDER — IPRATROPIUM-ALBUTEROL 0.5-2.5 (3) MG/3ML IN SOLN: 3.0000 mL | RESPIRATORY_TRACT | Status: DC | PRN

## 2016-10-04 MED ORDER — ACETAMINOPHEN 500 MG PO TABS: 500.0000 mg | ORAL_TABLET | Freq: Every day | ORAL | Status: DC | PRN

## 2016-10-04 MED ORDER — MAGNESIUM SULFATE IN D5W 1-5 GM/100ML-% IV SOLN
1.0000 g | Freq: Once | INTRAVENOUS | Status: AC
  Administered 2016-10-04: 1 g via INTRAVENOUS
  Filled 2016-10-04: qty 100

## 2016-10-04 MED ORDER — OXYCODONE-ACETAMINOPHEN 5-325 MG PO TABS: 1.0000 | ORAL_TABLET | ORAL | Status: DC | PRN

## 2016-10-04 MED ORDER — VITAMIN D (ERGOCALCIFEROL) 1.25 MG (50000 UNIT) PO CAPS: 50000.0000 [IU] | ORAL_CAPSULE | ORAL | Status: DC

## 2016-10-04 MED ORDER — KCL IN DEXTROSE-NACL 20-5-0.9 MEQ/L-%-% IV SOLN
INTRAVENOUS | Status: DC
Start: 2016-10-04 — End: 2016-10-06
  Administered 2016-10-04 – 2016-10-05 (×2): via INTRAVENOUS
  Filled 2016-10-04 (×3): qty 1000

## 2016-10-04 MED ORDER — PANTOPRAZOLE SODIUM 40 MG PO TBEC
40.0000 mg | DELAYED_RELEASE_TABLET | Freq: Every day | ORAL | Status: DC
  Filled 2016-10-04: qty 1

## 2016-10-04 MED ORDER — FUROSEMIDE 10 MG/ML IJ SOLN
20.0000 mg | Freq: Once | INTRAMUSCULAR | Status: AC
  Administered 2016-10-05: 20 mg via INTRAVENOUS
  Filled 2016-10-04: qty 2

## 2016-10-04 MED ORDER — DEXTROSE 5 % IV SOLN
1.0000 g | INTRAVENOUS | Status: DC
  Administered 2016-10-04: 1 g via INTRAVENOUS
  Filled 2016-10-04: qty 10

## 2016-10-04 MED ORDER — BUDESONIDE 0.5 MG/2ML IN SUSP
0.5000 mg | Freq: Two times a day (BID) | RESPIRATORY_TRACT | Status: DC
  Administered 2016-10-04 – 2016-10-15 (×23): 0.5 mg via RESPIRATORY_TRACT
  Filled 2016-10-04 (×24): qty 2

## 2016-10-04 MED ORDER — DM-GUAIFENESIN ER 30-600 MG PO TB12
1.0000 | ORAL_TABLET | Freq: Two times a day (BID) | ORAL | Status: DC
  Administered 2016-10-04: 1 via ORAL
  Filled 2016-10-04 (×2): qty 1

## 2016-10-04 MED ORDER — DEXTROSE 5 % IV SOLN
500.0000 mg | INTRAVENOUS | Status: DC
  Administered 2016-10-04 – 2016-10-07 (×3): 500 mg via INTRAVENOUS
  Filled 2016-10-04 (×3): qty 500

## 2016-10-04 MED ORDER — PILOCARPINE HCL 4 % OP SOLN
1.0000 [drp] | Freq: Two times a day (BID) | OPHTHALMIC | Status: DC
  Administered 2016-10-04 – 2016-10-15 (×24): 1 [drp] via OPHTHALMIC
  Filled 2016-10-04: qty 15

## 2016-10-04 MED ORDER — METHOCARBAMOL 500 MG PO TABS
500.0000 mg | ORAL_TABLET | Freq: Two times a day (BID) | ORAL | Status: DC
  Administered 2016-10-04: 500 mg via ORAL
  Filled 2016-10-04 (×2): qty 1

## 2016-10-04 MED ORDER — SODIUM CHLORIDE 0.9 % IV SOLN
INTRAVENOUS | Status: DC
Start: 2016-10-04 — End: 2016-10-04
  Administered 2016-10-04: 03:00:00 via INTRAVENOUS

## 2016-10-04 MED ORDER — LEVOTHYROXINE SODIUM 100 MCG IV SOLR
37.5000 ug | Freq: Every day | INTRAVENOUS | Status: DC
  Administered 2016-10-04 – 2016-10-10 (×7): 37.5 ug via INTRAVENOUS
  Filled 2016-10-04 (×7): qty 5

## 2016-10-04 MED ORDER — ORAL CARE MOUTH RINSE
15.0000 mL | Freq: Two times a day (BID) | OROMUCOSAL | Status: DC
  Administered 2016-10-04 – 2016-10-15 (×20): 15 mL via OROMUCOSAL

## 2016-10-04 MED ORDER — DORZOLAMIDE HCL 2 % OP SOLN
1.0000 [drp] | Freq: Two times a day (BID) | OPHTHALMIC | Status: DC
  Administered 2016-10-04 – 2016-10-15 (×24): 1 [drp] via OPHTHALMIC
  Filled 2016-10-04: qty 10

## 2016-10-04 MED ORDER — METHOCARBAMOL 1000 MG/10ML IJ SOLN
500.0000 mg | Freq: Two times a day (BID) | INTRAMUSCULAR | Status: DC
  Administered 2016-10-04 – 2016-10-05 (×3): 500 mg via INTRAVENOUS
  Filled 2016-10-04 (×4): qty 5

## 2016-10-04 MED ORDER — SODIUM CHLORIDE 0.9% FLUSH
10.0000 mL | Freq: Two times a day (BID) | INTRAVENOUS | Status: DC
  Administered 2016-10-04 – 2016-10-13 (×10): 10 mL

## 2016-10-04 MED ORDER — FLUOXETINE HCL 20 MG PO CAPS: 20.0000 mg | ORAL_CAPSULE | Freq: Every day | ORAL | Status: DC

## 2016-10-04 MED ORDER — TRAVOPROST (BAK FREE) 0.004 % OP SOLN
1.0000 [drp] | Freq: Every day | OPHTHALMIC | Status: DC
  Administered 2016-10-04 – 2016-10-14 (×11): 1 [drp] via OPHTHALMIC
  Filled 2016-10-04 (×2): qty 2.5

## 2016-10-04 MED ORDER — ACETAMINOPHEN 650 MG RE SUPP: 325.0000 mg | RECTAL | Status: DC | PRN

## 2016-10-04 MED ORDER — WARFARIN - PHARMACIST DOSING INPATIENT: Freq: Every day | Status: DC

## 2016-10-04 MED ORDER — IPRATROPIUM-ALBUTEROL 0.5-2.5 (3) MG/3ML IN SOLN
3.0000 mL | RESPIRATORY_TRACT | Status: DC
  Administered 2016-10-05 (×6): 3 mL via RESPIRATORY_TRACT
  Filled 2016-10-04 (×7): qty 3

## 2016-10-04 MED ORDER — BENZONATATE 100 MG PO CAPS: 100.0000 mg | ORAL_CAPSULE | Freq: Three times a day (TID) | ORAL | Status: DC

## 2016-10-04 MED ORDER — SODIUM CHLORIDE 0.9 % IV SOLN
30.0000 meq | Freq: Once | INTRAVENOUS | Status: AC
  Administered 2016-10-04: 30 meq via INTRAVENOUS
  Filled 2016-10-04: qty 15

## 2016-10-04 MED ORDER — PANTOPRAZOLE SODIUM 40 MG IV SOLR
40.0000 mg | Freq: Every day | INTRAVENOUS | Status: DC
  Administered 2016-10-04 – 2016-10-07 (×4): 40 mg via INTRAVENOUS
  Filled 2016-10-04 (×4): qty 40

## 2016-10-04 MED ORDER — METHYLPREDNISOLONE SODIUM SUCC 125 MG IJ SOLR
60.0000 mg | Freq: Four times a day (QID) | INTRAMUSCULAR | Status: DC
  Administered 2016-10-04 (×2): 60 mg via INTRAVENOUS
  Filled 2016-10-04 (×2): qty 2

## 2016-10-04 MED ORDER — SODIUM CHLORIDE 0.9% FLUSH: 10.0000 mL | INTRAVENOUS | Status: DC | PRN

## 2016-10-04 MED ORDER — METHYLPREDNISOLONE SODIUM SUCC 125 MG IJ SOLR
60.0000 mg | Freq: Two times a day (BID) | INTRAMUSCULAR | Status: DC
  Administered 2016-10-05: 60 mg via INTRAVENOUS
  Filled 2016-10-04: qty 2

## 2016-10-04 NOTE — ED Notes (Signed)
Attempted to call report. Unavailable at current time

## 2016-10-04 NOTE — Progress Notes (Signed)
Modified Barium Swallow Progress Note  Patient Details  Name: [name] MRN: [id number] Date of Birth: 28-Feb-1927  Today's Date: 10/04/2016  Modified Barium Swallow completed.  Full report located under Chart Review in the Imaging Section.  Brief recommendations include the following:  Clinical Impression  Patient presents with oropharyngeal swallow which appears grossly within functional limits in keeping with normative age-related changes in swallowing. One instance of frank aspiration of thin liquids noted which occured before the swallow when patient coughed which appeared to be in response to solids sticking in the upper esophagus. Swallow is delayed to the level of the pyriform sinuses with thin liquids, with intermittent transient penetration which clears with the swallow. Rotary mastication and oral manipulation mildly impaired with solids, secondary to ill-fitting dentures. Swallow is timely with no abnormal residue for pureed and regular solids. An esophageal sweep revealed bolus stasis in the upper esophagus. Radiologist was consulted who confirmed severely reduced motility, and reflux, backflow of bolus into the pharynx. Following the assessment, patient with continued coughing and suspected reflux of barium, which was suctioned by RN from the oral cavity. Given the above findings, patient is at severe risk for aspiration of backflow, gastric contents. Recommended to RN for patient to be kept upright to reduce risk of aspirating reflux of barium. Recommend NPO with ice chips for comfort following oral care pending GI consult. Given initial tolerance of thin liquids prior to administering solids, patient may be able to tolerate a liquid diet; will defer to GI and follow up with patient for education and to determine safest, least restrictive diet for adequate nutrition/hydration and for quality of life.    Swallow Evaluation Recommendations   Recommended Consults: Consider GI  evaluation;Consider esophageal assessment   SLP Diet Recommendations: NPO;Ice chips PRN after oral care       Medication Administration: Via alternative means               Oral Care Recommendations: Oral care QID     Deneise Lever, [place] CF-SLP Speech-Language Pathologist [phone]   Aliene Altes 10/04/2016,12:29 PM

## 2016-10-04 NOTE — Progress Notes (Addendum)
ANTICOAGULATION CONSULT NOTE   Pharmacy Consult for Warfarin >>lovenox Indication: atrial fibrillation  No Known Allergies  Vital Signs: Temp: 97.6 F (36.4 C) (02/18 0800) Temp Source: Oral (02/18 0800) BP: 111/78 (02/18 0800) Pulse Rate: 79 (02/18 0800)  Labs:  Recent Labs  10/03/16 1950 10/03/16 1953 10/04/16 0548  HGB 10.1*  --  9.2*  HCT 30.5*  --  28.7*  PLT 188  --  162  LABPROT  --  33.7* 35.5*  INR  --  3.23 3.44  CREATININE 1.62*  --  1.32*   Medical History: Past Medical History:  Diagnosis Date  . Atrial fibrillation ([place])   . COPD (chronic obstructive pulmonary disease) ([place])   . Fall   . Glaucoma   . Hypothyroidism   . Neuropathy ([facility])    PERIPHERAL  . Osteoarthritis   . Osteoporosis   . Trimalleolar fracture    LEFT TRIMALLEOLAR FRACTURE, DISLOCATION WITH OBLIQUE FRACTURE OF THE DISTAL FIBULAR DIAPHYSIS, AND NOTED BEING MILDLY COMMINUTED    Assessment: 83 y/o F on warfarin PTA for afib here with weakness. Hgb 10.1. Scr 1.62. INR is supra-therapeutic on admit at 3.23.   INR trended up slightly to 3.44 this morning, hgb also trended down to 9.2. Patient failed swallow exam. Orders to start lovenox once INR falls below 2.   Goal of Therapy:  INR 2-3 Monitor platelets by anticoagulation protocol: Yes   Plan:  Lovenox when INR<2 Daily INR  Erin Hearing PharmD., BCPS Clinical Pharmacist Pager [phone] 10/04/2016 9:14 AM

## 2016-10-04 NOTE — Progress Notes (Signed)
Notified Dr. Maudie Mercury that couldn't get labs. He ordered for a PICC to be inserted and is aware after multiple attempts am labs and stat lactic acid was not drawn.

## 2016-10-04 NOTE — ED Notes (Signed)
Pt complaining of difficulty breathing, breath sounds diminished, pt able to speak in mostly complete sentences. Pt's BP 74/45. Pt alert and oriented x 4.

## 2016-10-04 NOTE — Progress Notes (Signed)
[facility] TEAM 1 - Stepdown/ICU TEAM  ALVADA OLANO  [id number] DOB: 10/21/1926 DOA: 10/03/2016 PCP: Leonides Sake, MD    Brief Narrative:  83 y.o. female with history of chronic atrial fibrillation on warfarin, COPD, chronic diastolic CHF, hypothyroidism, hypertension, and depression who presented w/ c/o productive cough and generalized weakness progressing over 1 month, unresponsive to 2 outpt courses of abx and steroids.   In the ED patient was afebrile, saturating 88% on room air, with blood pressure of 80/67, and vitals otherwise stable. EKG featured atrial fibrillation with repolarization abnormality and QTc prolonged to 532 ms. Chest x-ray reveals a new air space consolidation in the lingula concerning for pneumonia. Chemistry panels notable for a potassium of 2.5 and creatinine 1.62, up from an apparent baseline of 0.8.   Subjective: Pt is seen for a f/u visit.    Assessment & Plan:  Sepsis w/ acute hypoxic resp failure secondary to LLL PNA - CXR with lingular PNA - empiric Rocephin and azithromycin - evaluating for possible aspiration   Dysphagia? - appears to have an esophageal based dysphagia - consider a trial of prokinetic - consider formal esophagram - npo w/ sips of clears for now (did fine w/ clears during testing, until solids were given)  COPD with acute exacerbation  - cont usual med tx - much improved   Hypotension w/ a hx of HTN    - secondary to the acute infection with poor appetite, dehydration, and continued use of antihypertensives and diuretics   Hypokalemia with prolonged QTc - Serum potassium 2.5 on admission with QTc prolonged to 532 ms  Acute kidney injury  - SCr 1.62 on admission - baseline of 0.8   - prerenal azotemia in setting of soft BP and apparent dehydration;   Recent Labs Lab 10/03/16 1950 10/04/16 0548  CREATININE 1.62* 1.32*    Chronic diastolic CHF  - Appeared dry on admission   - TTE (08/05/15) with EF 60-65%; mild  concentric LVH; severe bi-atrial enlargement; mild-mod AI, MR, and TR - Lasix, metoprolol, and lisinopril held on admission in light of soft BP Filed Weights   10/04/16 0230  Weight: 66.3 kg (146 lb 1.6 oz)    Chronic atrial fibrillation  - rate-controlled  - CHADS-VASc 5 (age x2, gender, CHF, HTN)  - Warfarin per pharmacy - lovenox until oral intake resumed   Hypothyroidism  - Continue current-dose Synthroid   Acute encephalopathy - Resolved by time of admission  Normocytic anemia  - Hgb 10.1 on admission   DVT prophylaxis: warfarin / lovenox  Code Status: FULL CODE Family Communication: spoke w/ daughter at bedside   Disposition Plan: SDU   Consultants:  none  Procedures: none  Antimicrobials:  Anti-infectives    Start     Dose/Rate Route Frequency Ordered Stop   10/04/16 2200  cefTRIAXone (ROCEPHIN) 1 g in dextrose 5 % 50 mL IVPB     1 g 100 mL/hr over 30 Minutes Intravenous Every 24 hours 10/04/16 0018 10/11/16 2159   10/04/16 2200  azithromycin (ZITHROMAX) 500 mg in dextrose 5 % 250 mL IVPB     500 mg 250 mL/hr over 60 Minutes Intravenous Every 24 hours 10/04/16 0018 10/11/16 2159   10/03/16 2200  azithromycin (ZITHROMAX) 500 mg in dextrose 5 % 250 mL IVPB     500 mg 250 mL/hr over 60 Minutes Intravenous  Once 10/03/16 2154 10/03/16 2321   10/03/16 2200  cefTRIAXone (ROCEPHIN) 1 g in dextrose 5 % 50 mL IVPB  1 g 100 mL/hr over 30 Minutes Intravenous  Once 10/03/16 2154 10/03/16 2250      Objective: Blood pressure 113/85, pulse (!) 25, temperature 98.1 F (36.7 C), temperature source Oral, resp. rate (!) 22, height 5\' 5"  (1.651 m), weight 66.3 kg (146 lb 1.6 oz), SpO2 92 %.  Intake/Output Summary (Last 24 hours) at 10/04/16 1220 Last data filed at 10/04/16 0400  Gross per 24 hour  Intake             4451 ml  Output              300 ml  Net             4151 ml   Filed Weights   10/04/16 0230  Weight: 66.3 kg (146 lb 1.6 oz)     Examination: Pt was seen for a f/u visit.    CBC:  Recent Labs Lab 10/03/16 1950 10/03/16 1953 10/04/16 0548  WBC 8.0  --  8.4  NEUTROABS  --  6.3 7.7  HGB 10.1*  --  9.2*  HCT 30.5*  --  28.7*  MCV 93.6  --  95.0  PLT 188  --  0000000   Basic Metabolic Panel:  Recent Labs Lab 10/03/16 1950 10/04/16 0548  NA 136 138  K 2.5* 3.2*  CL 93* 102  CO2 32 27  GLUCOSE 88 111*  BUN 18 16  CREATININE 1.62* 1.32*  CALCIUM 8.5* 7.9*  MG 1.8 1.9   GFR: Estimated Creatinine Clearance: 27 mL/min (by C-G formula based on SCr of 1.32 mg/dL (H)).  Liver Function Tests: No results for input(s): AST, ALT, ALKPHOS, BILITOT, PROT, ALBUMIN in the last 168 hours. No results for input(s): LIPASE, AMYLASE in the last 168 hours. No results for input(s): AMMONIA in the last 168 hours.  Coagulation Profile:  Recent Labs Lab 10/03/16 1953 10/04/16 0548  INR 3.23 3.44    Recent Results (from the past 240 hour(s))  MRSA PCR Screening     Status: None   Collection Time: 10/04/16  1:53 AM  Result Value Ref Range Status   MRSA by PCR NEGATIVE NEGATIVE Final    Comment:        The GeneXpert MRSA Assay (FDA approved for NASAL specimens only), is one component of a comprehensive MRSA colonization surveillance program. It is not intended to diagnose MRSA infection nor to guide or monitor treatment for MRSA infections.      Scheduled Meds: . azithromycin  500 mg Intravenous Q24H  . benzonatate  100 mg Oral TID  . budesonide  0.5 mg Nebulization BID  . cefTRIAXone (ROCEPHIN)  IV  1 g Intravenous Q24H  . dextromethorphan-guaiFENesin  1 tablet Oral Q12H  . dorzolamide  1 drop Left Eye BID  . gabapentin  600 mg Oral QHS  . ipratropium-albuterol  3 mL Nebulization Q6H  . levothyroxine  37.5 mcg Intravenous Daily  . methocarbamol (ROBAXIN)  IV  500 mg Intravenous BID  . methylPREDNISolone (SOLU-MEDROL) injection  60 mg Intravenous Q6H  . pantoprazole (PROTONIX) IV  40 mg  Intravenous Daily  . pilocarpine  1 drop Left Eye BID  . timolol  1 drop Left Eye BID  . Travoprost (BAK Free)  1 drop Left Eye QHS  . [START ON 10/08/2016] Vitamin D (Ergocalciferol)  50,000 Units Oral Q Thu  . Warfarin - Pharmacist Dosing Inpatient   Does not apply q1800     LOS: 1 day   Time spent: No  Charge  Cherene Altes, MD [facility] Office  [phone] Pager - Text Page per Amion as per below:  On-Call/Text Page:      [email or web address]      password [email or web address]  If 7PM-7AM, please contact night-coverage [email or web address] Password [email or web address] 10/04/2016, 12:20 PM

## 2016-10-04 NOTE — Evaluation (Signed)
Clinical/Bedside Swallow Evaluation Patient Details  Name: [name] MRN: [id number] Date of Birth: 08-23-1926  Today's Date: 10/04/2016 Time: SLP Start Time (ACUTE ONLY): 0932 SLP Stop Time (ACUTE ONLY): 0946 SLP Time Calculation (min) (ACUTE ONLY): 14 min  Past Medical History:  Past Medical History:  Diagnosis Date  . Atrial fibrillation ([place])   . COPD (chronic obstructive pulmonary disease) ([place])   . Fall   . Glaucoma   . Hypothyroidism   . Neuropathy ([facility])    PERIPHERAL  . Osteoarthritis   . Osteoporosis   . Trimalleolar fracture    LEFT TRIMALLEOLAR FRACTURE, DISLOCATION WITH OBLIQUE FRACTURE OF THE DISTAL FIBULAR DIAPHYSIS, AND NOTED BEING MILDLY COMMINUTED   Past Surgical History:  Past Surgical History:  Procedure Laterality Date  . ORIF TIBIA & FIBULA FRACTURES    . VAGINAL HYSTERECTOMY     HPI:  Adrienne Burrowes Bowmanis a 83 y.o.femalewith medical history significant forchronic atrial fibrillation on warfarin, COPD, chronic diastolic CHF, hypothyroidism, hypertension, and depression who now presents to the emergency department for evaluation of productive cough and generalized weakness. Patient had reportedly developed a productive cough and dyspnea approximately one month ago and was started on antibiotics at that time. Her symptoms failed to improve and she was recently started on a second course of antibiotics with Levaquin, in addition to prednisone. She completed the prednisone and finished her Levaquin today, but reports no improvement in her cough or dyspnea, and worsening in her generalized weakness and lethargy. Chest x-ray reveals a new air space consolidation in the lingula concerning for pneumonia. Respiratory status improved in the ED, patient on [place]. Admitted to the stepdown unit for ongoing evaluation and management of sepsis secondary to pneumonia. No prior history of dysphagia per chart. Referred by MD for swallowing evaluation due to coughing with fluids,  recurrent pneumonia.    Assessment / Plan / Recommendation Clinical Impression  Patient presents with explosive cough and increase in respiratory rate to 33 following presentation of thin liquids and nectar thick liquids, suggestive of reduced airway protection. She complains, "I can't swallow" and endorses feeling liquids and solids "getting hung up" in her throat (point to thyroid notch). Given recurrent pneumonia and the above overt signs of aspiration, recommend NPO pending objective evaluation to rule out aspiration, aid in determining safest, least restrictive diet and to identify compensatory techniques to improve swallow function. MBS scheduled and will be performed this morning.     Aspiration Risk  Moderate aspiration risk    Diet Recommendation NPO   Medication Administration: Via alternative means    Other  Recommendations Oral Care Recommendations: Oral care QID   Follow up Recommendations        Frequency and Duration            Prognosis Prognosis for Safe Diet Advancement: Good      Swallow Study   General Date of Onset: 10/03/16 HPI: [name] a 83 y.o.femalewith medical history significant forchronic atrial fibrillation on warfarin, COPD, chronic diastolic CHF, hypothyroidism, hypertension, and depression who now presents to the emergency department for evaluation of productive cough and generalized weakness. Patient had reportedly developed a productive cough and dyspnea approximately one month ago and was started on antibiotics at that time. Her symptoms failed to improve and she was recently started on a second course of antibiotics with Levaquin, in addition to prednisone. She completed the prednisone and finished her Levaquin today, but reports no improvement in her cough or dyspnea, and worsening  in her generalized weakness and lethargy. Chest x-ray reveals a new air space consolidation in the lingula concerning for pneumonia. Respiratory status  improved in the ED, patient on [place]. Admitted to the stepdown unit for ongoing evaluation and management of sepsis secondary to pneumonia. No prior history of dysphagia per chart. Referred by MD for swallowing evaluation due to coughing with fluids, recurrent pneumonia.  Type of Study: Bedside Swallow Evaluation Previous Swallow Assessment: none per chart Diet Prior to this Study: Regular;Thin liquids Temperature Spikes Noted: No Respiratory Status: Nasal cannula History of Recent Intubation: No Behavior/Cognition: Alert;Cooperative;Pleasant mood;Confused Oral Cavity Assessment: Dry Oral Care Completed by SLP: Yes Oral Cavity - Dentition: Dentures, top;Dentures, bottom Vision: Functional for self-feeding Patient Positioning: Upright in bed Baseline Vocal Quality: Normal Volitional Cough: Strong Volitional Swallow: Able to elicit    Oral/Motor/Sensory Function Overall Oral Motor/Sensory Function: Within functional limits   Ice Chips Ice chips: Not tested   Thin Liquid Thin Liquid: Impaired Presentation: Cup Pharyngeal  Phase Impairments: Cough - Immediate;Wet Vocal Quality;Change in Vital Signs;Suspected delayed Swallow    Nectar Thick Nectar Thick Liquid: Impaired Presentation: Self Fed;Cup Pharyngeal Phase Impairments: Suspected delayed Swallow;Wet Vocal Quality;Cough - Immediate;Change in Vital Signs   Honey Thick Honey Thick Liquid: Not tested   Puree Puree: Within functional limits   Solid   GO   Solid: Not tested        Aliene Altes 10/04/2016,9:55 AM   Deneise Lever, MS CF-SLP Speech-Language Pathologist [phone]

## 2016-10-04 NOTE — Progress Notes (Signed)
Peripherally Inserted Central Catheter/Midline Placement  The IV Nurse has discussed with the patient and/or persons authorized to consent for the patient, the purpose of this procedure and the potential benefits and risks involved with this procedure.  The benefits include less needle sticks, lab draws from the catheter, and the patient may be discharged home with the catheter. Risks include, but not limited to, infection, bleeding, blood clot (thrombus formation), and puncture of an artery; nerve damage and irregular heartbeat and possibility to perform a PICC exchange if needed/ordered by physician.  Alternatives to this procedure were also discussed.  Bard Power PICC patient education guide, fact sheet on infection prevention and patient information card has been provided to patient /or left at bedside.  Dtr signed consent- all questions answered.  PICC/Midline Placement Documentation  PICC Double Lumen 10/04/16 PICC Right Brachial 40 cm 0 cm (Active)  Indication for Insertion or Continuance of Line Vasoactive infusions;Prolonged intravenous therapies;Limited venous access - need for IV therapy >5 days (PICC only);Poor Vasculature-patient has had multiple peripheral attempts or PIVs lasting less than 24 hours 10/04/2016  3:32 PM  Exposed Catheter (cm) 0 cm 10/04/2016  3:32 PM  Site Assessment Clean;Dry;Intact 10/04/2016  3:32 PM  Lumen #1 Status Flushed;Saline locked;Blood return noted 10/04/2016  3:32 PM  Lumen #2 Status Flushed;Saline locked;Blood return noted 10/04/2016  3:32 PM  Dressing Type Transparent 10/04/2016  3:32 PM  Dressing Status Clean;Dry;Intact;Antimicrobial disc in place 10/04/2016  3:32 PM  Line Care Connections checked and tightened 10/04/2016  3:32 PM  Line Adjustment (NICU/IV Team Only) No 10/04/2016  3:32 PM  Dressing Intervention New dressing 10/04/2016  3:32 PM  Dressing Change Due 10/11/16 10/04/2016  3:32 PM       Rolena Infante 10/04/2016, 3:33 PM

## 2016-10-04 NOTE — Progress Notes (Signed)
ANTICOAGULATION CONSULT NOTE - Initial Consult  Pharmacy Consult for Warfarin  Indication: atrial fibrillation  No Known Allergies  Vital Signs: Temp: 97.9 F (36.6 C) (02/17 1932) Temp Source: Oral (02/17 1932) BP: 112/89 (02/18 0020) Pulse Rate: 70 (02/18 0021)  Labs:  Recent Labs  10/03/16 1950 10/03/16 1953  HGB 10.1*  --   HCT 30.5*  --   PLT 188  --   LABPROT  --  33.7*  INR  --  3.23  CREATININE 1.62*  --    Medical History: Past Medical History:  Diagnosis Date  . Atrial fibrillation ([place])   . COPD (chronic obstructive pulmonary disease) ([place])   . Fall   . Glaucoma   . Hypothyroidism   . Neuropathy ([facility])    PERIPHERAL  . Osteoarthritis   . Osteoporosis   . Trimalleolar fracture    LEFT TRIMALLEOLAR FRACTURE, DISLOCATION WITH OBLIQUE FRACTURE OF THE DISTAL FIBULAR DIAPHYSIS, AND NOTED BEING MILDLY COMMINUTED    Assessment: 83 y/o F on warfarin PTA for afib here with weakness. Hgb 10.1. Scr 1.62. INR is supra-therapeutic on admit at 3.23.   Goal of Therapy:  INR 2-3 Monitor platelets by anticoagulation protocol: Yes   Plan:  -Re-assess INR with AM labs   Narda Bonds 10/04/2016,12:26 AM

## 2016-10-04 NOTE — H&P (Signed)
History and Physical    [name] [id number] DOB: 09-Dec-1926 DOA: 10/03/2016  PCP: Leonides Sake, MD   Patient coming from: Home  Chief Complaint: Gen weakness, productive cough, poor appetite  HPI: [name] is a 83 y.o. female with medical history significant for chronic atrial fibrillation on warfarin, COPD, chronic diastolic CHF, hypothyroidism, hypertension, and depression who now presents to the emergency department for evaluation of productive cough and generalized weakness. Patient had reportedly developed a productive cough and dyspnea approximately one month ago and was started on antibiotics at that time. Her symptoms failed to improve and she was recently started on a second course of antibiotics with Levaquin, in addition to prednisone. She completed the prednisone and finished her Levaquin today, but reports no improvement in her cough or dyspnea, and worsening in her generalized weakness and lethargy. Her daughter at the bedside reports that the patient is typically able to transfer from a power chair to bed or commode, but has been too weak to do this for the past 2 days. She has also had poor appetite in the last several days. There has been no known fevers or chills and the patient denies chest pain or palpitations. Patient's daughter believes that the patient may have been a little confused a couple points today, but the patient denies any headache, lightheadedness, change in vision or hearing, or focal numbness or weakness. In addition to the antibiotics and steroids, the patient is also been using home neb treatments, Mucinex, and Tessalon.   ED Course: Upon arrival to the ED, patient is found to be afebrile, saturating only 88% on room air, with blood pressure of 80/67, and vitals otherwise stable. EKG features atrial fibrillation with repolarization abnormality and QTc prolonged to 532 ms. Chest x-ray reveals a new air space consolidation in the lingula concerning  for pneumonia. Chemistry panels notable for a potassium of 2.5 and creatinine 1.62, up from an apparent baseline of 0.8. CBC features a normocytic anemia with hemoglobin of 10.1, down from 11-12 last year. INR is slightly supratherapeutic at 3.23. Troponin is within the normal limits and BNP is elevated but stable at 459. Lactic acid is drawn but remains pending. Patient was given 40 mEq oral potassium, 30 mEq IV potassium, and empiric Rocephin and azithromycin. Blood pressure trended down in the emergency department and the patient was given 2 L of normal saline. Lactic acid remains pending at this time. Blood pressure remains soft, patient is mentating well, and respiratory status has improved in the ED. She will be admitted to the stepdown unit for ongoing evaluation and management of sepsis secondary to pneumonia.  Review of Systems:  All other systems reviewed and apart from HPI, are negative.  Past Medical History:  Diagnosis Date  . Atrial fibrillation ([place])   . COPD (chronic obstructive pulmonary disease) ([place])   . Fall   . Glaucoma   . Hypothyroidism   . Neuropathy ([facility])    PERIPHERAL  . Osteoarthritis   . Osteoporosis   . Trimalleolar fracture    LEFT TRIMALLEOLAR FRACTURE, DISLOCATION WITH OBLIQUE FRACTURE OF THE DISTAL FIBULAR DIAPHYSIS, AND NOTED BEING MILDLY COMMINUTED    Past Surgical History:  Procedure Laterality Date  . ORIF TIBIA & FIBULA FRACTURES    . VAGINAL HYSTERECTOMY       reports that she has never smoked. She has never used smokeless tobacco. She reports that she does not drink alcohol or use drugs.  No Known Allergies  History reviewed.  No pertinent family history.   Prior to Admission medications   Medication Sig Start Date End Date Taking? Authorizing Provider  acetaminophen (TYLENOL) 325 MG tablet Take 325 mg by mouth daily as needed for headache (pain).   Yes Historical Provider, MD  albuterol (PROVENTIL) (2.5 MG/3ML) 0.083% nebulizer solution Take  2.5 mg by nebulization every 4 (four) hours as needed for wheezing or shortness of breath.    Yes Historical Provider, MD  benzonatate (TESSALON) 100 MG capsule Take 100 mg by mouth 3 (three) times daily. 09/28/16  Yes Historical Provider, MD  budesonide (PULMICORT) 0.5 MG/2ML nebulizer solution Take 0.5 mg by nebulization 2 (two) times daily. 09/14/16  Yes Historical Provider, MD  Dextromethorphan-Guaifenesin (MUCINEX DM PO) Take 10 mLs by mouth every 12 (twelve) hours.   Yes Historical Provider, MD  dorzolamide (TRUSOPT) 2 % ophthalmic solution Place 1 drop into the left eye 2 (two) times daily. 09/11/16  Yes Historical Provider, MD  ergocalciferol (VITAMIN D2) 50000 UNITS capsule Take 50,000 Units by mouth every Thursday. thursday   Yes Historical Provider, MD  FLUoxetine (PROZAC) 20 MG capsule Take 20 mg by mouth daily. 09/11/16  Yes Historical Provider, MD  furosemide (LASIX) 20 MG tablet Take 1 tablet (20 mg total) by mouth 2 (two) times daily. Patient taking differently: Take 20-40 mg by mouth See admin instructions. Take 1 tablet (20 mg) by mouth every morning and 2 tablets (40 mg) at 2pm 07/01/16  Yes Virgel Manifold, MD  gabapentin (NEURONTIN) 600 MG tablet Take 600 mg by mouth at bedtime. 03/27/15  Yes Historical Provider, MD  levofloxacin (LEVAQUIN) 500 MG tablet Take 500 mg by mouth daily. 10 day course to be completed 10/04/16 09/28/16  Yes Historical Provider, MD  levothyroxine (SYNTHROID, LEVOTHROID) 75 MCG tablet Take 75 mcg by mouth daily before breakfast.   Yes Historical Provider, MD  lisinopril (PRINIVIL,ZESTRIL) 5 MG tablet Take 5 mg by mouth daily. 06/12/16  Yes Historical Provider, MD  loperamide (IMODIUM) 2 MG capsule Take 2-4 mg by mouth See admin instructions. Take 2 capsules (4 mg) by mouth every morning, may also take 1 capsule (2 mg) during the day as needed for loose stools   Yes Historical Provider, MD  LUTEIN-ZEAXANTHIN PO Take 1 tablet by mouth daily with supper.   Yes  Historical Provider, MD  methocarbamol (ROBAXIN) 500 MG tablet Take 500 mg by mouth 2 (two) times daily.   Yes Historical Provider, MD  metoprolol tartrate (LOPRESSOR) 25 MG tablet Take 1 tablet (25 mg total) by mouth 2 (two) times daily. Patient taking differently: Take 12.5 mg by mouth 2 (two) times daily.  12/29/10 07/05/17 Yes Peter M Martinique, MD  omeprazole (PRILOSEC) 40 MG capsule Take 40 mg by mouth daily.   Yes Historical Provider, MD  ondansetron (ZOFRAN-ODT) 4 MG disintegrating tablet Take 4 mg by mouth every 8 (eight) hours as needed for nausea or vomiting.  09/23/16  Yes Historical Provider, MD  oxyCODONE-acetaminophen (PERCOCET/ROXICET) 5-325 MG per tablet Take 1-2 tablets by mouth every 4 (four) hours as needed for pain. Patient taking differently: Take 1 tablet by mouth 2 (two) times daily.  05/24/12  Yes John-Adam Bonk, MD  pilocarpine (PILOCAR) 4 % ophthalmic solution Place 1 drop into the left eye 2 (two) times daily.    Yes Historical Provider, MD  spironolactone (ALDACTONE) 25 MG tablet Take 25 mg by mouth daily.   Yes Historical Provider, MD  timolol (TIMOPTIC) 0.5 % ophthalmic solution Place 1 drop into the  left eye 2 (two) times daily. 09/11/16  Yes Historical Provider, MD  travoprost, benzalkonium, (TRAVATAN) 0.004 % ophthalmic solution Place 1 drop into the left eye at bedtime.    Yes Historical Provider, MD  warfarin (COUMADIN) 2 MG tablet Take 1 mg by mouth daily with supper.   Yes Historical Provider, MD  potassium chloride (K-DUR) 10 MEQ tablet Take 1 tablet (10 mEq total) by mouth 2 (two) times daily. Patient not taking: Reported on 07/05/2016 07/01/16   Virgel Manifold, MD    Physical Exam: Vitals:   10/03/16 1934 10/03/16 2107 10/03/16 2137 10/03/16 2300  BP:  (!) 81/67 (!) 84/52 (!) 89/50  Pulse:  79 80   Resp:  19 22 22   Temp:      TempSrc:      SpO2: 97% (!) 89%        Constitutional: No acute distress, calm, comfortable Eyes: PERTLA, lids and conjunctivae  normal ENMT: Mucous membranes are dry. Posterior pharynx clear of any exudate or lesions.   Neck: normal, supple, no masses, no thyromegaly Respiratory: Diminished breath sounds bilaterally with expiratory wheeze on right and rhonchi at left base. No accessory muscle use.  Cardiovascular: Rate ~80 and irregular. No extremity edema. No significant JVD. Abdomen: No distension, no tenderness, no masses palpated. Bowel sounds normal.  Musculoskeletal: no clubbing / cyanosis. No joint deformity upper and lower extremities.    Skin: no significant rashes, lesions, ulcers. Warm, dry, well-perfused. Poor turgor. Neurologic: CN 2-12 grossly intact. Sensation intact, DTR normal. Gross hearing impairment.   Psychiatric: Normal judgment and insight. Alert and oriented x 3. Normal mood and affect.     Labs on Admission: I have personally reviewed following labs and imaging studies  CBC:  Recent Labs Lab 10/03/16 1950 10/03/16 1953  WBC 8.0  --   NEUTROABS  --  6.3  HGB 10.1*  --   HCT 30.5*  --   MCV 93.6  --   PLT 188  --    Basic Metabolic Panel:  Recent Labs Lab 10/03/16 1950  NA 136  K 2.5*  CL 93*  CO2 32  GLUCOSE 88  BUN 18  CREATININE 1.62*  CALCIUM 8.5*  MG 1.8   GFR: CrCl cannot be calculated (Unknown ideal weight.). Liver Function Tests: No results for input(s): AST, ALT, ALKPHOS, BILITOT, PROT, ALBUMIN in the last 168 hours. No results for input(s): LIPASE, AMYLASE in the last 168 hours. No results for input(s): AMMONIA in the last 168 hours. Coagulation Profile:  Recent Labs Lab 10/03/16 1953  INR 3.23   Cardiac Enzymes: No results for input(s): CKTOTAL, CKMB, CKMBINDEX, TROPONINI in the last 168 hours. BNP (last 3 results) No results for input(s): PROBNP in the last 8760 hours. HbA1C: No results for input(s): HGBA1C in the last 72 hours. CBG: No results for input(s): GLUCAP in the last 168 hours. Lipid Profile: No results for input(s): CHOL, HDL,  LDLCALC, TRIG, CHOLHDL, LDLDIRECT in the last 72 hours. Thyroid Function Tests: No results for input(s): TSH, T4TOTAL, FREET4, T3FREE, THYROIDAB in the last 72 hours. Anemia Panel: No results for input(s): VITAMINB12, FOLATE, FERRITIN, TIBC, IRON, RETICCTPCT in the last 72 hours. Urine analysis:    Component Value Date/Time   COLORURINE YELLOW 07/05/2016 [place] 07/05/2016 1511   LABSPEC 1.019 07/05/2016 1511   PHURINE 6.0 07/05/2016 1511   GLUCOSEU NEGATIVE 07/05/2016 1511   HGBUR NEGATIVE 07/05/2016 1511   BILIRUBINUR NEGATIVE 07/05/2016 1511   KETONESUR NEGATIVE 07/05/2016  [place] 07/05/2016 1511   UROBILINOGEN 1.0 11/22/2010 0949   NITRITE NEGATIVE 07/05/2016 1511   LEUKOCYTESUR NEGATIVE 07/05/2016 1511   Sepsis Labs: @LABRCNTIP (procalcitonin:4,lacticidven:4) )No results found for this or any previous visit (from the past 240 hour(s)).   Radiological Exams on Admission: Dg Chest 2 View  Result Date: 10/03/2016 CLINICAL DATA:  83 year old female with history productive cough and increasing weakness. EXAM: CHEST  2 VIEW COMPARISON:  Chest x-ray 04/13/2017. FINDINGS: Increasing opacity in the lingula concerning for airspace consolidation. Small left pleural effusion. Right lung appears clear. No right pleural effusion. No pneumothorax. No definite suspicious appearing pulmonary nodules or masses. No evidence of pulmonary edema. Heart size is mildly enlarged. Upper mediastinal contours are within normal limits. Atherosclerosis in the thoracic aorta. Old compression fracture of a lower thoracic vertebral body (likely T11) with 90% loss of anterior vertebral body height, similar to the prior examination, resulting in an acute kyphotic deformity in the lower thoracic spine. IMPRESSION: 1. New airspace consolidation in the lingula with small left-sided pleural effusion, concerning for lingular pneumonia. Followup PA and lateral chest X-ray is recommended in  3-4 weeks following trial of antibiotic therapy to ensure resolution and exclude underlying malignancy. 2. Aortic atherosclerosis. 3. Mild cardiomegaly. Electronically Signed   By: Vinnie Langton M.D.   On: 10/03/2016 20:50    EKG: Independently reviewed. Atrial fibrillation, non-specific repolarization abnormality, QTc 532 ms   Assessment/Plan  1. Sepsis secondary to CAP, acute hypoxic respiratory failure  - Presents with confusion, AKI, hypotension, and hypoxia secondary to PNA; lactic acid remains pending  - CXR with lingular PNA and she was started on empiric Rocephin and azithromycin  - UA remains pending; abdominal exam benign  - Obtain blood, urine, and sputum cultures; check lactate - Given 30 cc/kg NS with improvement in BP; acute encephalopathy resolved by time of admission - Continue antibiotics, follow-up UA, lactate, and cultures   2. COPD with acute exacerbation  - There is wheezing and obstructed breathing on admission, likely precipitated by the infection  - She has recently completed a course of prednisone  - Continue Pulmicort, start DuoNeb q6h scheduled and q4h prn, systemic steroid with IV Solu-Medrol, abx as above, supplemental O2 prn    3. Hypotension, hx of HTN    - MAP in low 60's in ED, has improved with 2 liters of NS  - Likely secondary to the acute infection with poor appetite, dehydration, and continued use of antihypertensives and diuretics  - No chest pain or troponin elevation to suggest an ischemic cause  - Continue gentle IVF hydration, hold antihypertensives, monitor in stepdown unit initially    4. Hypokalemia with prolonged QTc - Serum potassium 2.5 on admission with QTc prolonged to 532 ms; likely secondary to recent increase in Lasix, recent discontinuation of K-Dur, loose stools, and poor nutrition   - 40 mEq oral, and 30 mEq IV potassium given in ED; 1 g IV magnesium given  - Monitor on telemetry, avoid QT-prolonging agents, replace potassium to  4 and mag to 2   5. Acute kidney injury  - SCr is 1.62 on admission, up from apparent baseline of 0.8   - Likely a prerenal azotemia in setting of soft BP and apparent dehydration; ATN possible, UA pending  - She was given 2 liters NS in ED and is continued on a gentle IVF hydration with NS  - Avoid nephrotoxins where possible, hold lisinopril and Lasix, repeat chemistries in am  6. Chronic diastolic CHF  - Appears dry on admission and is receiving IVF with NS  - TTE (08/05/15) with EF 60-65%; mild concentric LVH; severe bi-atrial enlargement; mild-mod AI, MR, and TR - Managed at home with Lasix (recently increased), metoprolol, and lisinopril; these are held on admission in light of soft BP, apparent dehydration, and AKI - Follow I/O's and daily wts  7. Chronic atrial fibrillation  - In rate-controlled a fib on admission  - CHADS-VASc 5 (age x2, gender, CHF, HTN)  - Warfarin held on admission for INR 3.23; repeat INR in am; pharmacy to assist with dosing and much appreciated  - Metoprolol is held on admission secondary to soft BP, will resume as BP allows   8. Hypothyroidism  - Appears to be stable  - TSH ordered given very low potassium and worsened anemia   - Continue current-dose Synthroid for now    9. Acute encephalopathy - Resolved by time of admission - Pt's daughter reports that there were a couple episodes of transient confusion on day of admission - Likely secondary to sepsis with low BP; this is addressed as above  - Continue monitor  10. Normocytic anemia  - Hgb 10.1 on admission with normal MCV; down from 11-12 range last year  - Pt denies melena or hematochezia  - INR is slightly supratherapeutic and warfarin held, but no bleeding identified  - Check anemia panel    DVT prophylaxis: warfarin Code Status: Full  Family Communication: Daughter updated at bedside Disposition Plan: Admit to stepdown unit Consults called: None Admission status: Inpatient     Vianne Bulls, MD [facility] Pager [phone]  If 7PM-7AM, please contact night-coverage [email or web address] Password [facility]  10/04/2016, 12:19 AM

## 2016-10-05 ENCOUNTER — Inpatient Hospital Stay (HOSPITAL_COMMUNITY): Payer: Medicare Other

## 2016-10-05 LAB — CBC
HCT: 28.9 % — ABNORMAL LOW (ref 36.0–46.0)
HEMOGLOBIN: 9.4 g/dL — AB (ref 12.0–15.0)
MCH: 30.9 pg (ref 26.0–34.0)
MCHC: 32.5 g/dL (ref 30.0–36.0)
MCV: 95.1 fL (ref 78.0–100.0)
Platelets: 193 10*3/uL (ref 150–400)
RBC: 3.04 MIL/uL — AB (ref 3.87–5.11)
RDW: 15 % (ref 11.5–15.5)
WBC: 9.5 10*3/uL (ref 4.0–10.5)

## 2016-10-05 LAB — BLOOD GAS, ARTERIAL
ACID-BASE EXCESS: 0.1 mmol/L (ref 0.0–2.0)
BICARBONATE: 23.6 mmol/L (ref 20.0–28.0)
O2 CONTENT: 3 L/min
O2 Saturation: 97.4 %
PATIENT TEMPERATURE: 98.6
pCO2 arterial: 34.8 mmHg (ref 32.0–48.0)
pH, Arterial: 7.446 (ref 7.350–7.450)
pO2, Arterial: 88.9 mmHg (ref 83.0–108.0)

## 2016-10-05 LAB — COMPREHENSIVE METABOLIC PANEL
ALT: 38 U/L (ref 14–54)
ANION GAP: 9 (ref 5–15)
AST: 60 U/L — ABNORMAL HIGH (ref 15–41)
Albumin: 2.6 g/dL — ABNORMAL LOW (ref 3.5–5.0)
Alkaline Phosphatase: 66 U/L (ref 38–126)
BUN: 15 mg/dL (ref 6–20)
CHLORIDE: 105 mmol/L (ref 101–111)
CO2: 26 mmol/L (ref 22–32)
Calcium: 8.5 mg/dL — ABNORMAL LOW (ref 8.9–10.3)
Creatinine, Ser: 1.16 mg/dL — ABNORMAL HIGH (ref 0.44–1.00)
GFR, EST AFRICAN AMERICAN: 48 mL/min — AB (ref >60)
GFR, EST NON AFRICAN AMERICAN: 41 mL/min — AB (ref >60)
Glucose, Bld: 172 mg/dL — ABNORMAL HIGH (ref 65–99)
POTASSIUM: 3.8 mmol/L (ref 3.5–5.1)
Sodium: 140 mmol/L (ref 135–145)
Total Bilirubin: 0.9 mg/dL (ref 0.3–1.2)
Total Protein: 5.1 g/dL — ABNORMAL LOW (ref 6.5–8.1)

## 2016-10-05 LAB — BASIC METABOLIC PANEL
ANION GAP: 9 (ref 5–15)
BUN: 15 mg/dL (ref 6–20)
CALCIUM: 8.2 mg/dL — AB (ref 8.9–10.3)
CO2: 24 mmol/L (ref 22–32)
Chloride: 106 mmol/L (ref 101–111)
Creatinine, Ser: 1.12 mg/dL — ABNORMAL HIGH (ref 0.44–1.00)
GFR, EST AFRICAN AMERICAN: 50 mL/min — AB (ref >60)
GFR, EST NON AFRICAN AMERICAN: 43 mL/min — AB (ref >60)
Glucose, Bld: 225 mg/dL — ABNORMAL HIGH (ref 65–99)
Potassium: 4.2 mmol/L (ref 3.5–5.1)
Sodium: 139 mmol/L (ref 135–145)

## 2016-10-05 LAB — BRAIN NATRIURETIC PEPTIDE: B NATRIURETIC PEPTIDE 5: 1649.3 pg/mL — AB (ref 0.0–100.0)

## 2016-10-05 LAB — URINE CULTURE: CULTURE: NO GROWTH

## 2016-10-05 LAB — MAGNESIUM
Magnesium: 2 mg/dL (ref 1.7–2.4)
Magnesium: 2.1 mg/dL (ref 1.7–2.4)

## 2016-10-05 LAB — APTT: aPTT: 66 seconds — ABNORMAL HIGH (ref 24–36)

## 2016-10-05 MED ORDER — PIPERACILLIN-TAZOBACTAM 3.375 G IVPB
3.3750 g | Freq: Three times a day (TID) | INTRAVENOUS | Status: DC
  Administered 2016-10-05 – 2016-10-06 (×3): 3.375 g via INTRAVENOUS
  Filled 2016-10-05 (×4): qty 50

## 2016-10-05 MED ORDER — LORAZEPAM 2 MG/ML IJ SOLN
0.5000 mg | Freq: Once | INTRAMUSCULAR | Status: AC
  Administered 2016-10-05: 0.25 mg via INTRAVENOUS
  Filled 2016-10-05: qty 1

## 2016-10-05 MED ORDER — LORAZEPAM 2 MG/ML IJ SOLN
0.5000 mg | INTRAMUSCULAR | Status: DC | PRN
  Administered 2016-10-05: 0.5 mg via INTRAVENOUS
  Filled 2016-10-05: qty 1

## 2016-10-05 MED ORDER — IPRATROPIUM-ALBUTEROL 0.5-2.5 (3) MG/3ML IN SOLN
3.0000 mL | Freq: Four times a day (QID) | RESPIRATORY_TRACT | Status: DC
  Administered 2016-10-06: 3 mL via RESPIRATORY_TRACT
  Filled 2016-10-05: qty 3

## 2016-10-05 MED ORDER — LORAZEPAM 2 MG/ML IJ SOLN
0.5000 mg | Freq: Once | INTRAMUSCULAR | Status: AC
  Administered 2016-10-05: 0.5 mg via INTRAVENOUS
  Filled 2016-10-05: qty 1

## 2016-10-05 NOTE — Progress Notes (Signed)
[name] TEAM 1 - Stepdown/ICU TEAM  [name]  [id number] DOB: [birth year] DOA: 10/03/2016 PCP: Adrienne Sake, MD    Brief Narrative:  83 y.o. female with history of chronic atrial fibrillation on warfarin, COPD, chronic diastolic CHF, hypothyroidism, hypertension, and depression who presented w/ c/o productive cough and generalized weakness progressing over 1 month, unresponsive to 2 outpt courses of abx and steroids.   In the ED patient was afebrile, saturating 88% on room air, with blood pressure of 80/67, and vitals otherwise stable. EKG featured atrial fibrillation with repolarization abnormality and QTc prolonged to 532 ms. Chest x-ray revealed a new air space consolidation in the lingula concerning for pneumonia. Chemistry panels notable for a potassium of 2.5 and creatinine 1.62, up from an apparent baseline of 0.8.   Subjective: The patient became quite agitated starting late last night and into this morning.  She was fighting and is oxygen support and other medical interventions.  A low dose of Ativan was given.  Since that time the patient is been heavily sedated.  At the time of my visit her respirations appear labored.  I'm not able to wake her up.  The patient's daughters at the bedside and states that she has had moments where she is woken up since her Ativan was given but that she has continued to be quite agitated at those times.  I had a lengthy discussion with the patient's daughter at the bedside.  She informs me that her mother has always insisted that she be full code and that "everything be done that can be done".  The patient's daughter has attempted to convince the patient that NO CODE BLUE status may be more appropriate but the patient has insisted that she wanted to remain full code.  Assessment & Plan:  Sepsis w/ acute hypoxic resp failure secondary to LLL PNA - CXR with lingular PNA - empiric Rocephin and azithromycin - possible aspiration - change abx to  zosyn   Resp distress - patient has exhibited significant increased work of breathing this afternoon and is currently lethargic - I will obtain an ABG but anticipate that initiation of BiPAP will be necessary - I discussed in great detail with the patient's daughter that it is unlikely that she will cooperate well with BiPAP and that that might lead [place] w/ no option but to intubate, though I feel that escalation to that level of care would be inappropriate - the patient's daughter states she agrees but does relate that the patient herself absolutely would wish to be intubated if she could speak for herself  Dysphagia? - appears to have an esophageal based dysphagia - consider a trial of prokinetic when pt more alert - consider formal esophagram - npo for now   Acute encephalopathy - recurring - now likely related to sundowning - avoiding haldol due to prolonged QT - very low dose ativan has led to severe sedation - trial of low dose QHS seroquel - B12 and folate are normal   COPD with acute exacerbation  - cont usual med tx - no active wheezing at this time   Hypotension w/ a hx of HTN    - secondary to the acute infection with poor appetite, dehydration, and continued use of antihypertensives and diuretics - improved w/ volume expansion   Prolonged QTc - maximizing lytes - follow on tele   Hypokalemia  - Serum potassium 2.5 on admission with QTc prolonged to 532 ms - corrected to normal range  Recent Labs Lab 10/03/16 1950 10/04/16 0548 10/04/16 2352 10/05/16 0610  K 2.5* 3.2* 4.2 3.8    Acute kidney injury  - SCr 1.62 on admission - baseline of 0.8   - prerenal azotemia in setting of soft BP and apparent dehydration - cont to follow w/ volume expansion   Recent Labs Lab 10/03/16 1950 10/04/16 0548 10/04/16 2352 10/05/16 0610  CREATININE 1.62* 1.32* 1.12* 1.16*    Chronic diastolic CHF  - Appeared dry on admission   - TTE (08/05/15) with EF 60-65%; mild concentric  LVH; severe bi-atrial enlargement; mild-mod AI, MR, and TR - Lasix, metoprolol, and lisinopril held on admission in light of soft BP - baseline wgt appears to be [employer]   10/04/16 0230 10/05/16 0342  Weight: 66.3 kg (146 lb 1.6 oz) 66 kg (145 lb 8 oz)    Chronic atrial fibrillation  - rate-controlled  - CHADS-VASc 5 (age x2, gender, CHF, HTN)  - Warfarin per pharmacy - lovenox until oral intake resumed   Hypothyroidism  - Continue current-dose Synthroid   Normocytic anemia  - Hgb 10.1 on admission   DVT prophylaxis: warfarin / lovenox  Code Status: FULL CODE Family Communication: spoke w/ daughter at bedside at length    Disposition Plan: SDU   Consultants:  none  Procedures: none  Antimicrobials:  Anti-infectives    Start     Dose/Rate Route Frequency Ordered Stop   10/04/16 2200  cefTRIAXone (ROCEPHIN) 1 g in dextrose 5 % 50 mL IVPB     1 g 100 mL/hr over 30 Minutes Intravenous Every 24 hours 10/04/16 0018 10/11/16 2159   10/04/16 2200  azithromycin (ZITHROMAX) 500 mg in dextrose 5 % 250 mL IVPB     500 mg 250 mL/hr over 60 Minutes Intravenous Every 24 hours 10/04/16 0018 10/11/16 2159   10/03/16 2200  azithromycin (ZITHROMAX) 500 mg in dextrose 5 % 250 mL IVPB     500 mg 250 mL/hr over 60 Minutes Intravenous  Once 10/03/16 2154 10/03/16 2321   10/03/16 2200  cefTRIAXone (ROCEPHIN) 1 g in dextrose 5 % 50 mL IVPB     1 g 100 mL/hr over 30 Minutes Intravenous  Once 10/03/16 2154 10/03/16 2250      Objective: Blood pressure (!) 107/50, pulse 76, temperature 97.6 F (36.4 C), temperature source Oral, resp. rate (!) 39, height 5\' 5"  (1.651 m), weight 66 kg (145 lb 8 oz), SpO2 99 %.  Intake/Output Summary (Last 24 hours) at 10/05/16 1327 Last data filed at 10/05/16 1200  Gross per 24 hour  Intake           890.83 ml  Output              350 ml  Net           540.83 ml   Filed Weights   10/04/16 0230 10/05/16 0342  Weight: 66.3 kg (146 lb 1.6  oz) 66 kg (145 lb 8 oz)    Examination: General: Increased work of breathing with tachypnea Lungs: Poor air movement throughout all fields - no wheezing - very diminished breath sounds in left base Cardiovascular: Irregularly irregular - no murmur or gallop Abdomen: Nondistended, soft, bowel sounds positive, no rebound, no ascites, no appreciable mass Extremities: No significant cyanosis, clubbing, or edema bilateral lower extremities  CBC:  Recent Labs Lab 10/03/16 1950 10/03/16 1953 10/04/16 0548 10/05/16 0610  WBC 8.0  --  8.4 9.5  NEUTROABS  --  6.3 7.7  --   HGB 10.1*  --  9.2* 9.4*  HCT 30.5*  --  28.7* 28.9*  MCV 93.6  --  95.0 95.1  PLT 188  --  162 0000000   Basic Metabolic Panel:  Recent Labs Lab 10/03/16 1950 10/04/16 0548 10/04/16 2352 10/05/16 0610  NA 136 138 139 140  K 2.5* 3.2* 4.2 3.8  CL 93* 102 106 105  CO2 32 27 24 26   GLUCOSE 88 111* 225* 172*  BUN 18 16 15 15   CREATININE 1.62* 1.32* 1.12* 1.16*  CALCIUM 8.5* 7.9* 8.2* 8.5*  MG 1.8 1.9 2.1 2.0   GFR: Estimated Creatinine Clearance: 30.7 mL/min (by C-G formula based on SCr of 1.16 mg/dL (H)).  Liver Function Tests:  Recent Labs Lab 10/05/16 0610  AST 60*  ALT 38  ALKPHOS 66  BILITOT 0.9  PROT 5.1*  ALBUMIN 2.6*    Coagulation Profile:  Recent Labs Lab 10/03/16 1953 10/04/16 0548  INR 3.23 3.44    Recent Results (from the past 240 hour(s))  Culture, Urine     Status: None   Collection Time: 10/04/16  1:23 AM  Result Value Ref Range Status   Specimen Description URINE, CATHETERIZED  Final   Special Requests NONE  Final   Culture NO GROWTH  Final   Report Status 10/05/2016 FINAL  Final  MRSA PCR Screening     Status: None   Collection Time: 10/04/16  1:53 AM  Result Value Ref Range Status   MRSA by PCR NEGATIVE NEGATIVE Final    Comment:        The GeneXpert MRSA Assay (FDA approved for NASAL specimens only), is one component of a comprehensive MRSA  colonization surveillance program. It is not intended to diagnose MRSA infection nor to guide or monitor treatment for MRSA infections.      Scheduled Meds: . azithromycin  500 mg Intravenous Q24H  . budesonide  0.5 mg Nebulization BID  . cefTRIAXone (ROCEPHIN)  IV  1 g Intravenous Q24H  . dorzolamide  1 drop Left Eye BID  . ipratropium-albuterol  3 mL Nebulization Q4H  . levothyroxine  37.5 mcg Intravenous Daily  . mouth rinse  15 mL Mouth Rinse BID  . methocarbamol (ROBAXIN)  IV  500 mg Intravenous BID  . methylPREDNISolone (SOLU-MEDROL) injection  60 mg Intravenous Q12H  . pantoprazole (PROTONIX) IV  40 mg Intravenous Daily  . pilocarpine  1 drop Left Eye BID  . sodium chloride flush  10-40 mL Intracatheter Q12H  . timolol  1 drop Left Eye BID  . Travoprost (BAK Free)  1 drop Left Eye QHS     LOS: 2 days    Cherene Altes, MD [facility] Office  [phone] Pager - Text Page per Amion as per below:  On-Call/Text Page:      [email or web address]      password [email or web address]  If 7PM-7AM, please contact night-coverage [email or web address] Password [email or web address] 10/05/2016, 1:27 PM

## 2016-10-05 NOTE — Progress Notes (Signed)
SLP Cancellation Note  Patient Details Name: AYOKA SPAKE MRN: [id number] DOB: [birth year]   Cancelled treatment:       Reason Eval/Treat Not Completed: Fatigue/lethargy limiting ability to participate   Lynann Beaver 10/05/2016, 1:52 PM

## 2016-10-05 NOTE — Progress Notes (Signed)
Becoming restless, desats to 11's  With o2 3l [place]. Md made aware . Ativan 0.5mg  iv given daughter at bedside. Continue to monitor.

## 2016-10-05 NOTE — Progress Notes (Signed)
Inpatient Diabetes Program Recommendations  AACE/ADA: New Consensus Statement on Inpatient Glycemic Control (2015)  Target Ranges:  Prepandial:   less than 140 mg/dL      Peak postprandial:   less than 180 mg/dL (1-2 hours)      Critically ill patients:  140 - 180 mg/dL   No results found for: GLUCAP, HGBA1C  Review of Glycemic Control Results for [name], [name] (MRN [id number]) as of 10/05/2016 10:55  Ref. Range 10/03/2016 19:50 10/04/2016 05:48 10/04/2016 23:52 10/05/2016 06:10  Glucose Latest Ref Range: 65 - 99 mg/dL 88 111 (H) 225 (H) 172 (H)   Inpatient Diabetes Program Recommendations:  Please consider Glycemic control order set with sensitive correction 0-9 units tid.  Thank you, Nani Gasser. Keven Soucy, RN, MSN, CDE Inpatient Glycemic Control Team Team Pager [phone] (8am-5pm) 10/05/2016 10:56 AM

## 2016-10-06 ENCOUNTER — Encounter (HOSPITAL_COMMUNITY): Payer: Self-pay | Admitting: Physician Assistant

## 2016-10-06 ENCOUNTER — Inpatient Hospital Stay (HOSPITAL_COMMUNITY): Payer: Medicare Other

## 2016-10-06 DIAGNOSIS — G9340 Encephalopathy, unspecified: Secondary | ICD-10-CM

## 2016-10-06 DIAGNOSIS — I5033 Acute on chronic diastolic (congestive) heart failure: Secondary | ICD-10-CM

## 2016-10-06 DIAGNOSIS — J9601 Acute respiratory failure with hypoxia: Secondary | ICD-10-CM

## 2016-10-06 DIAGNOSIS — I482 Chronic atrial fibrillation: Secondary | ICD-10-CM

## 2016-10-06 LAB — POCT I-STAT 3, ART BLOOD GAS (G3+)
Acid-Base Excess: 2 mmol/L (ref 0.0–2.0)
Bicarbonate: 23.8 mmol/L (ref 20.0–28.0)
O2 Saturation: 89 %
TCO2: 25 mmol/L (ref 0–100)
pCO2 arterial: 26.6 mmHg — ABNORMAL LOW (ref 32.0–48.0)
pH, Arterial: 7.558 — ABNORMAL HIGH (ref 7.350–7.450)
pO2, Arterial: 47 mmHg — ABNORMAL LOW (ref 83.0–108.0)

## 2016-10-06 LAB — BASIC METABOLIC PANEL
ANION GAP: 11 (ref 5–15)
BUN: 19 mg/dL (ref 6–20)
CALCIUM: 8.3 mg/dL — AB (ref 8.9–10.3)
CO2: 21 mmol/L — ABNORMAL LOW (ref 22–32)
Chloride: 109 mmol/L (ref 101–111)
Creatinine, Ser: 1.3 mg/dL — ABNORMAL HIGH (ref 0.44–1.00)
GFR, EST AFRICAN AMERICAN: 42 mL/min — AB (ref >60)
GFR, EST NON AFRICAN AMERICAN: 36 mL/min — AB (ref >60)
Glucose, Bld: 133 mg/dL — ABNORMAL HIGH (ref 65–99)
POTASSIUM: 4 mmol/L (ref 3.5–5.1)
Sodium: 141 mmol/L (ref 135–145)

## 2016-10-06 LAB — CBC WITH DIFFERENTIAL/PLATELET
Basophils Absolute: 0 10*3/uL (ref 0.0–0.1)
Basophils Relative: 0 %
Eosinophils Absolute: 0 10*3/uL (ref 0.0–0.7)
Eosinophils Relative: 0 %
HEMATOCRIT: 28.4 % — AB (ref 36.0–46.0)
HEMOGLOBIN: 9.5 g/dL — AB (ref 12.0–15.0)
LYMPHS PCT: 4 %
Lymphs Abs: 0.4 10*3/uL — ABNORMAL LOW (ref 0.7–4.0)
MCH: 31.7 pg (ref 26.0–34.0)
MCHC: 33.5 g/dL (ref 30.0–36.0)
MCV: 94.7 fL (ref 78.0–100.0)
MONOS PCT: 7 %
Monocytes Absolute: 0.8 10*3/uL (ref 0.1–1.0)
NEUTROS ABS: 9.7 10*3/uL — AB (ref 1.7–7.7)
NEUTROS PCT: 89 %
Platelets: 206 10*3/uL (ref 150–400)
RBC: 3 MIL/uL — ABNORMAL LOW (ref 3.87–5.11)
RDW: 15.5 % (ref 11.5–15.5)
WBC: 10.9 10*3/uL — ABNORMAL HIGH (ref 4.0–10.5)

## 2016-10-06 LAB — PROCALCITONIN: Procalcitonin: 0.11 ng/mL

## 2016-10-06 LAB — CBC
HCT: 28.9 % — ABNORMAL LOW (ref 36.0–46.0)
HEMOGLOBIN: 9.4 g/dL — AB (ref 12.0–15.0)
MCH: 30.7 pg (ref 26.0–34.0)
MCHC: 32.5 g/dL (ref 30.0–36.0)
MCV: 94.4 fL (ref 78.0–100.0)
Platelets: 200 10*3/uL (ref 150–400)
RBC: 3.06 MIL/uL — AB (ref 3.87–5.11)
RDW: 15.2 % (ref 11.5–15.5)
WBC: 9.5 10*3/uL (ref 4.0–10.5)

## 2016-10-06 LAB — ABO/RH: ABO/RH(D): A POS

## 2016-10-06 LAB — PHOSPHORUS: PHOSPHORUS: 2.6 mg/dL (ref 2.5–4.6)

## 2016-10-06 LAB — TYPE AND SCREEN
ABO/RH(D): A POS
Antibody Screen: NEGATIVE

## 2016-10-06 LAB — PROTIME-INR
INR: 6.84 — AB
PROTHROMBIN TIME: 61.4 s — AB (ref 11.4–15.2)

## 2016-10-06 LAB — MAGNESIUM: MAGNESIUM: 2 mg/dL (ref 1.7–2.4)

## 2016-10-06 MED ORDER — LORAZEPAM 2 MG/ML IJ SOLN: 0.5000 mg | Freq: Four times a day (QID) | INTRAMUSCULAR | Status: DC | PRN

## 2016-10-06 MED ORDER — LEVALBUTEROL HCL 0.63 MG/3ML IN NEBU: 0.6300 mg | INHALATION_SOLUTION | Freq: Four times a day (QID) | RESPIRATORY_TRACT | Status: DC | PRN

## 2016-10-06 MED ORDER — DEXMEDETOMIDINE HCL IN NACL 200 MCG/50ML IV SOLN
0.1000 ug/kg/h | INTRAVENOUS | Status: DC
  Administered 2016-10-06: 1.2 ug/kg/h via INTRAVENOUS
  Administered 2016-10-06: 0.1 ug/kg/h via INTRAVENOUS
  Administered 2016-10-06 – 2016-10-07 (×2): 1.2 ug/kg/h via INTRAVENOUS
  Administered 2016-10-07: 0.6 ug/kg/h via INTRAVENOUS
  Filled 2016-10-06 (×5): qty 50

## 2016-10-06 MED ORDER — VITAMIN K1 10 MG/ML IJ SOLN
2.5000 mg | Freq: Once | INTRAVENOUS | Status: AC
  Administered 2016-10-06: 2.5 mg via INTRAVENOUS
  Filled 2016-10-06: qty 0.25

## 2016-10-06 MED ORDER — IPRATROPIUM BROMIDE 0.02 % IN SOLN
0.5000 mg | Freq: Four times a day (QID) | RESPIRATORY_TRACT | Status: DC
  Administered 2016-10-06 – 2016-10-10 (×15): 0.5 mg via RESPIRATORY_TRACT
  Filled 2016-10-06 (×16): qty 2.5

## 2016-10-06 MED ORDER — FUROSEMIDE 10 MG/ML IJ SOLN
40.0000 mg | Freq: Once | INTRAMUSCULAR | Status: AC
  Administered 2016-10-06: 40 mg via INTRAVENOUS
  Filled 2016-10-06: qty 4

## 2016-10-06 MED ORDER — FUROSEMIDE 10 MG/ML IJ SOLN
40.0000 mg | Freq: Four times a day (QID) | INTRAMUSCULAR | Status: AC
  Administered 2016-10-06 – 2016-10-07 (×3): 40 mg via INTRAVENOUS
  Filled 2016-10-06 (×3): qty 4

## 2016-10-06 MED ORDER — DEXTROSE 5 % IV SOLN
1.0000 g | INTRAVENOUS | Status: DC
  Administered 2016-10-06: 1 g via INTRAVENOUS
  Filled 2016-10-06 (×2): qty 1

## 2016-10-06 MED ORDER — LEVALBUTEROL HCL 0.63 MG/3ML IN NEBU
0.6300 mg | INHALATION_SOLUTION | Freq: Four times a day (QID) | RESPIRATORY_TRACT | Status: DC
  Administered 2016-10-06 – 2016-10-10 (×15): 0.63 mg via RESPIRATORY_TRACT
  Filled 2016-10-06 (×16): qty 3

## 2016-10-06 MED ORDER — METOPROLOL TARTRATE 5 MG/5ML IV SOLN
2.5000 mg | Freq: Four times a day (QID) | INTRAVENOUS | Status: DC
  Administered 2016-10-06: 2.5 mg via INTRAVENOUS
  Filled 2016-10-06 (×2): qty 5

## 2016-10-06 NOTE — Progress Notes (Signed)
RT called to the room to get an ABG on patient.  Patient breathing 47 bpm on Bipap. ABG results (below results) called to CCM and per MD give patient a break off the vent and apply oxygen.  RT applied Venti mask (40%) and administered scheduled breathing treatment.   PH 7.55 CO2 26.6 PaO2 47 HCO3 23.8 SPO2 89

## 2016-10-06 NOTE — Progress Notes (Signed)
INR 6.84 called by labs. Hospitalist notified. Vitamin K 2.5 ordered.

## 2016-10-06 NOTE — Progress Notes (Signed)
Upon bedside report, Pt was yelling, " I cant breathe!!". RN started breathing treatment. Pt also has crackles up mid back upon auscultation. Wendee Beavers MD paged. Verbal order 40 mg IV lasix once. Lasix given. Pt starting to make urine. EKG obtained as pt HR 80-120 afib. 7  beats VT, followed by 4 beats VT. MD notified. Will continue to monitor closely.

## 2016-10-06 NOTE — Progress Notes (Signed)
Pharmacy Antibiotic Note  [name] is a 83 y.o. female admitted on 10/03/2016 with pneumonia.  Pharmacy has been consulted for cefepime dosing. Patient is also on azithromycin. Today is day 4 of antibiotics. WBC up 10.9 s/p steroids, afebrile, CrCl ~ 29 ml/min  Plan: Cefepime 1g IV Q24H Monitor renal fxn, clinical status, cultures F/u abx length of therapy   Height: 5\' 5"  (165.1 cm) Weight: 152 lb 12.8 oz (69.3 kg) IBW/kg (Calculated) : 57  Temp (24hrs), Avg:97.6 F (36.4 C), Min:97.2 F (36.2 C), Max:98 F (36.7 C)   Recent Labs Lab 10/03/16 1950 10/04/16 0019 10/04/16 0548 10/04/16 2352 10/05/16 0610 10/06/16 0609 10/06/16 1320  WBC 8.0  --  8.4  --  9.5 9.5 10.9*  CREATININE 1.62*  --  1.32* 1.12* 1.16* 1.30*  --   LATICACIDVEN  --  0.8 1.1  --   --   --   --     Estimated Creatinine Clearance: 29.8 mL/min (by C-G formula based on SCr of 1.3 mg/dL (H)).    No Known Allergies  Antimicrobials this admission: Cefepime 2/20>> Zosyn 2/19>>2/20 Azith 2/17>>(2/25) Rocephin 2/17>>2/18  Microbiology results: 2/18 MRSA PCR: neg 2/18 Strep pneumo urine: neg 2/18 UCx: neg 2/17 BCx: ngtd  Thank you for allowing pharmacy to be a part of this patient's care.  Gwenlyn Perking, PharmD PGY1 Pharmacy Resident Pager: [phone] 10/06/2016 1:52 PM

## 2016-10-06 NOTE — Progress Notes (Signed)
Pt extremely restless and agitated; not tolerating BiPAP. ELink notified and verbal order given to collect stat ABG. Final result reported to [facility] MD. RT at beside. Verbal order given to switch pt to veniture mask due to pt inability to keep bipap on. Pt currently resting with intermitted use of accessory muscles and increase work of breathing. O2 99%. Will continue to monitor.

## 2016-10-06 NOTE — Progress Notes (Signed)
RT Note: Patient was placed on Bipap per MD order. Patient was placed on Ipap-10, Epap-5, Fio2-30% and is tolerating those settings well with no complications. Rt will continue to monitor.

## 2016-10-06 NOTE — Progress Notes (Signed)
[place] TEAM 1 - Stepdown/ICU TEAM  [name]  [id number] DOB: [birth year] DOA: 10/03/2016 PCP: Leonides Sake, MD    Brief Narrative:  83 y.o. female with history of chronic atrial fibrillation on warfarin, COPD, chronic diastolic CHF, hypothyroidism, hypertension, and depression who presented w/ c/o productive cough and generalized weakness progressing over 1 month, unresponsive to 2 outpt courses of abx and steroids.   In the ED patient was afebrile, saturating 88% on room air, with blood pressure of 80/67, and vitals otherwise stable. EKG featured atrial fibrillation with repolarization abnormality and QTc prolonged to 532 ms. Chest x-ray revealed a new air space consolidation in the lingula concerning for pneumonia. Chemistry panels notable for a potassium of 2.5 and creatinine 1.62, up from an apparent baseline of 0.8.   Subjective: The patient became quite agitated starting late last night and into this morning.  She was fighting and is oxygen support and other medical interventions.  A low dose of Ativan was given.  Since that time the patient is been heavily sedated.  At the time of my visit her respirations appear labored.  I'm not able to wake her up.  The patient's daughters at the bedside and states that she has had moments where she is woken up since her Ativan was given but that she has continued to be quite agitated at those times.  I had a lengthy discussion with the patient's daughter at the bedside.  She informs me that her mother has always insisted that she be full code and that "everything be done that can be done".  The patient's daughter has attempted to convince the patient that NO CODE BLUE status may be more appropriate but the patient has insisted that she wanted to remain full code.  Assessment & Plan:  Sepsis w/ acute hypoxic resp failure secondary to LLL PNA - CXR with lingular PNA - empiric Rocephin and azithromycin - possible aspiration - change rocephin  to zosyn   Resp distress - Will add lasix as this was initially held. Continue duonebs. - continue antibiotics  Dysphagia? - appears to have an esophageal based dysphagia - Speech therapy has recommended GI consultation and npo status. Will consult GI for further evaluation and recommendations. They have requested I order a barium swallow (not modified) based on that they will make further recommendations. (reportedly if it is a dysmotility problem then they dont have anything to offer).  Acute encephalopathy - recurring - now likely related to sundowning - avoiding haldol due to prolonged QT - very low dose ativan has led to severe sedation - trial of low dose QHS seroquel - B12 and folate are normal   COPD with acute exacerbation  - cont usual med tx - no active wheezing at this time   Hypotension w/ a hx of HTN    - Hypotension resolved. And blood pressure elevated currently  Non sustained Vtach - continue B blocker - Mg and phosphorus wnl on last check  Prolonged QTc - maximizing lytes - follow on tele   Hypokalemia  - Serum potassium 2.5 on admission with QTc prolonged to 532 ms - corrected to normal range   Recent Labs Lab 10/03/16 1950 10/04/16 0548 10/04/16 2352 10/05/16 0610 10/06/16 0609  K 2.5* 3.2* 4.2 3.8 4.0    Acute kidney injury  - SCr 1.62 on admission - baseline of 0.8   - prerenal azotemia in setting of soft BP and apparent dehydration - cont to follow w/ volume expansion  Recent Labs Lab 10/03/16 1950 10/04/16 0548 10/04/16 2352 10/05/16 0610 10/06/16 0609  CREATININE 1.62* 1.32* 1.12* 1.16* 1.30*    Chronic diastolic CHF  - Appeared dry on admission   - TTE (08/05/15) with EF 60-65%; mild concentric LVH; severe bi-atrial enlargement; mild-mod AI, MR, and TR - Lasix, metoprolol, and lisinopril held on admission in light of soft BP - baseline wgt appears to be [employer]   10/04/16 0230 10/05/16 0342 10/06/16 0428  Weight:  66.3 kg (146 lb 1.6 oz) 66 kg (145 lb 8 oz) 69.3 kg (152 lb 12.8 oz)    Chronic atrial fibrillation  - rate-controlled  - CHADS-VASc 5 (age x2, gender, CHF, HTN)  - Warfarin per pharmacy - lovenox until oral intake resumed   Hypothyroidism  - Continue current-dose Synthroid   Normocytic anemia  - Hgb 10.1 on admission, currently 9.4   DVT prophylaxis: warfarin / lovenox  Code Status: FULL CODE Family Communication: spoke w/ daughter at bedside at length    Disposition Plan: SDU   Consultants:  none  Procedures: none  Antimicrobials:  Anti-infectives    Start     Dose/Rate Route Frequency Ordered Stop   10/05/16 1500  piperacillin-tazobactam (ZOSYN) IVPB 3.375 g     3.375 g 12.5 mL/hr over 240 Minutes Intravenous Every 8 hours 10/05/16 1411     10/04/16 2200  cefTRIAXone (ROCEPHIN) 1 g in dextrose 5 % 50 mL IVPB  Status:  Discontinued     1 g 100 mL/hr over 30 Minutes Intravenous Every 24 hours 10/04/16 0018 10/05/16 1411   10/04/16 2200  azithromycin (ZITHROMAX) 500 mg in dextrose 5 % 250 mL IVPB     500 mg 250 mL/hr over 60 Minutes Intravenous Every 24 hours 10/04/16 0018 10/11/16 2159   10/03/16 2200  azithromycin (ZITHROMAX) 500 mg in dextrose 5 % 250 mL IVPB     500 mg 250 mL/hr over 60 Minutes Intravenous  Once 10/03/16 2154 10/03/16 2321   10/03/16 2200  cefTRIAXone (ROCEPHIN) 1 g in dextrose 5 % 50 mL IVPB     1 g 100 mL/hr over 30 Minutes Intravenous  Once 10/03/16 2154 10/03/16 2250      Objective: Blood pressure (!) 175/152, pulse 100, temperature 97.7 F (36.5 C), temperature source Oral, resp. rate (!) 26, height 5\' 5"  (1.651 m), weight 69.3 kg (152 lb 12.8 oz), SpO2 100 %.  Intake/Output Summary (Last 24 hours) at 10/06/16 1018 Last data filed at 10/06/16 0839  Gross per 24 hour  Intake             1040 ml  Output              350 ml  Net              690 ml   Filed Weights   10/04/16 0230 10/05/16 0342 10/06/16 0428  Weight: 66.3 kg (146 lb  1.6 oz) 66 kg (145 lb 8 oz) 69.3 kg (152 lb 12.8 oz)    Examination: General: Pt in nad, alert and awake.  Lungs: Poor air movement throughout all fields - no wheezing -  diminished breath sounds in bases Cardiovascular: Irregularly irregular - no murmur or gallop Abdomen: Nondistended, soft, bowel sounds positive, no rebound, no ascites, no appreciable mass Extremities: No significant cyanosis, clubbing, or edema bilateral lower extremities  CBC:  Recent Labs Lab 10/03/16 1950 10/03/16 1953 10/04/16 0548 10/05/16 0610 10/06/16 0609  WBC 8.0  --  8.4  9.5 9.5  NEUTROABS  --  6.3 7.7  --   --   HGB 10.1*  --  9.2* 9.4* 9.4*  HCT 30.5*  --  28.7* 28.9* 28.9*  MCV 93.6  --  95.0 95.1 94.4  PLT 188  --  162 193 [id number]   Basic Metabolic Panel:  Recent Labs Lab 10/03/16 1950 10/04/16 0548 10/04/16 2352 10/05/16 0610 10/06/16 0609 10/06/16 0900  NA 136 138 139 140 141  --   K 2.5* 3.2* 4.2 3.8 4.0  --   CL 93* 102 106 105 109  --   CO2 32 27 24 26  21*  --   GLUCOSE 88 111* 225* 172* 133*  --   BUN 18 16 15 15 19   --   CREATININE 1.62* 1.32* 1.12* 1.16* 1.30*  --   CALCIUM 8.5* 7.9* 8.2* 8.5* 8.3*  --   MG 1.8 1.9 2.1 2.0  --   --   PHOS  --   --   --   --   --  2.6   GFR: Estimated Creatinine Clearance: 29.8 mL/min (by C-G formula based on SCr of 1.3 mg/dL (H)).  Liver Function Tests:  Recent Labs Lab 10/05/16 0610  AST 60*  ALT 38  ALKPHOS 66  BILITOT 0.9  PROT 5.1*  ALBUMIN 2.6*    Coagulation Profile:  Recent Labs Lab 10/03/16 1953 10/04/16 0548 10/06/16 0609  INR 3.23 3.44 6.84*    Recent Results (from the past 240 hour(s))  Culture, blood (routine x 2) Call MD if unable to obtain prior to antibiotics being given     Status: None (Preliminary result)   Collection Time: 10/03/16  7:50 PM  Result Value Ref Range Status   Specimen Description BLOOD LEFT ANTECUBITAL  Final   Special Requests BOTTLES DRAWN AEROBIC AND ANAEROBIC 5CC EA  Final    Culture NO GROWTH 1 DAY  Final   Report Status PENDING  Incomplete  Culture, blood (routine x 2) Call MD if unable to obtain prior to antibiotics being given     Status: None (Preliminary result)   Collection Time: 10/03/16 10:00 PM  Result Value Ref Range Status   Specimen Description BLOOD RIGHT WRIST  Final   Special Requests BOTTLES DRAWN AEROBIC AND ANAEROBIC 5CC EA  Final   Culture NO GROWTH 1 DAY  Final   Report Status PENDING  Incomplete  Culture, Urine     Status: None   Collection Time: 10/04/16  1:23 AM  Result Value Ref Range Status   Specimen Description URINE, CATHETERIZED  Final   Special Requests NONE  Final   Culture NO GROWTH  Final   Report Status 10/05/2016 FINAL  Final  MRSA PCR Screening     Status: None   Collection Time: 10/04/16  1:53 AM  Result Value Ref Range Status   MRSA by PCR NEGATIVE NEGATIVE Final    Comment:        The GeneXpert MRSA Assay (FDA approved for NASAL specimens only), is one component of a comprehensive MRSA colonization surveillance program. It is not intended to diagnose MRSA infection nor to guide or monitor treatment for MRSA infections.      Scheduled Meds: . azithromycin  500 mg Intravenous Q24H  . budesonide  0.5 mg Nebulization BID  . dorzolamide  1 drop Left Eye BID  . ipratropium-albuterol  3 mL Nebulization Q6H  . levothyroxine  37.5 mcg Intravenous Daily  . mouth rinse  15 mL Mouth  Rinse BID  . metoprolol  2.5 mg Intravenous Q6H  . pantoprazole (PROTONIX) IV  40 mg Intravenous Daily  . pilocarpine  1 drop Left Eye BID  . piperacillin-tazobactam  3.375 g Intravenous Q8H  . sodium chloride flush  10-40 mL Intracatheter Q12H  . timolol  1 drop Left Eye BID  . Travoprost (BAK Free)  1 drop Left Eye QHS     LOS: 3 days    Velvet Bathe [facility] Office  [phone] Pager - Text Page per Shea Evans as per below:  On-Call/Text Page:      [email or web address]      password [email or web address]  If 7PM-7AM, please contact  night-coverage [email or web address] Password [email or web address] 10/06/2016, 10:18 AM

## 2016-10-06 NOTE — Progress Notes (Signed)
[place] for Warfarin >>lovenox Indication: atrial fibrillation  No Known Allergies  Vital Signs: Temp: 97.7 F (36.5 C) (02/20 0428) Temp Source: Oral (02/20 0428) BP: 175/152 (02/20 0848) Pulse Rate: 100 (02/20 0848)  Labs:  Recent Labs  10/03/16 1953 10/04/16 0548 10/04/16 2352 10/05/16 0610 10/05/16 0655 10/06/16 0609  HGB  --  9.2*  --  9.4*  --  9.4*  HCT  --  28.7*  --  28.9*  --  28.9*  PLT  --  162  --  193  --  200  APTT  --   --   --   --  66*  --   LABPROT 33.7* 35.5*  --   --   --  61.4*  INR 3.23 3.44  --   --   --  6.84*  CREATININE  --  1.32* 1.12* 1.16*  --  1.30*   Medical History: Past Medical History:  Diagnosis Date  . Atrial fibrillation ([place])   . COPD (chronic obstructive pulmonary disease) ([place])   . Fall   . Glaucoma   . Hypothyroidism   . Neuropathy ([facility])    PERIPHERAL  . Osteoarthritis   . Osteoporosis   . Trimalleolar fracture    LEFT TRIMALLEOLAR FRACTURE, DISLOCATION WITH OBLIQUE FRACTURE OF THE DISTAL FIBULAR DIAPHYSIS, AND NOTED BEING MILDLY COMMINUTED    Assessment: 83 y/o F on warfarin PTA for afib here with weakness. She is currently NPO due to dysphagia and pharmacy to begin lovenox if INR < 2.0 -INR= 6.84 (up), CBC stable   Goal of Therapy:  INR 2-3 Monitor platelets by anticoagulation protocol: Yes   Plan:  -Hold anticoagulation -Daily PT/INR  Hildred Laser, Pharm D 10/06/2016 10:21 AM

## 2016-10-06 NOTE — Consult Note (Signed)
Pulmonary & Critical Care Attending Consult  Physician Requesting Consult:  Velvet Bathe, M.D. / [facility]  Date of Consult:  10/06/2016  Reason for Consult/Chief Complaint:  Acute Hypoxic Respiratory Failure  History of Presenting Illness:  83 y.o. female admitted with a known history of chronic atrial fibrillation, reported COPD, chronic diastolic congestive heart failure, hypothyroidism, hypertension, and depression. Patient history obtained through the electronic medical record as she is currently altered. She presented to the emergency department with a productive cough and generalized weakness. Cough and dyspnea developed 1 month prior and was treated with antibiotics. Patient failed to improve and was given a second course of antibiotics along with prednisone. Antibiotic was Levaquin. Levaquin finished the day of admission without significant improvement in cough or dyspnea. She had worsening generalized weakness and lethargy. Daughter had previously reported she was typically able to transfer from a power chair to bed or commode but had been too weak to perform this for 2 days prior to admission. Patient also had a poor appetite for several days. No known fevers or chills. No known chest pain or palpitations. Daughter did feel the patient had been confused somewhat in the days preceding her admission. No other new apparent focal deficits. In the emergency room the patient was noted to be hypoxic on room air and mildly hypotensive. QTC was noted to be prolonged. She also was noted to have a normocytic anemia and coagulopathy likely secondary to her recent antibiotics and chronic Coumadin. Patient's BNP initially was 459 but then rose to 1649 by the second day of admission. Patient's overall volume status continued to be increased. Her antihypertensive regimen was briefly held. Given patient's worsening respiratory status we were consulted to provide further assessment and support.  Review of  Systems: Unable to obtain given altered mentation.   No Known Allergies  No current facility-administered medications on file prior to encounter.    Current Outpatient Prescriptions on File Prior to Encounter  Medication Sig Dispense Refill  . albuterol (PROVENTIL) (2.5 MG/3ML) 0.083% nebulizer solution Take 2.5 mg by nebulization every 4 (four) hours as needed for wheezing or shortness of breath.     . ergocalciferol (VITAMIN D2) 50000 UNITS capsule Take 50,000 Units by mouth every Thursday. thursday    . furosemide (LASIX) 20 MG tablet Take 1 tablet (20 mg total) by mouth 2 (two) times daily. (Patient taking differently: Take 20-40 mg by mouth See admin instructions. Take 1 tablet (20 mg) by mouth every morning and 2 tablets (40 mg) at 2pm) 6 tablet 0  . gabapentin (NEURONTIN) 600 MG tablet Take 600 mg by mouth at bedtime.    Marland Kitchen lisinopril (PRINIVIL,ZESTRIL) 5 MG tablet Take 5 mg by mouth daily.    . methocarbamol (ROBAXIN) 500 MG tablet Take 500 mg by mouth 2 (two) times daily.    . metoprolol tartrate (LOPRESSOR) 25 MG tablet Take 1 tablet (25 mg total) by mouth 2 (two) times daily. (Patient taking differently: Take 12.5 mg by mouth 2 (two) times daily. ) 60 tablet 5  . omeprazole (PRILOSEC) 40 MG capsule Take 40 mg by mouth daily.    Marland Kitchen oxyCODONE-acetaminophen (PERCOCET/ROXICET) 5-325 MG per tablet Take 1-2 tablets by mouth every 4 (four) hours as needed for pain. (Patient taking differently: Take 1 tablet by mouth 2 (two) times daily. ) 23 tablet 0  . pilocarpine (PILOCAR) 4 % ophthalmic solution Place 1 drop into the left eye 2 (two) times daily.     Marland Kitchen spironolactone (ALDACTONE) 25  MG tablet Take 25 mg by mouth daily.    . travoprost, benzalkonium, (TRAVATAN) 0.004 % ophthalmic solution Place 1 drop into the left eye at bedtime.     . potassium chloride (K-DUR) 10 MEQ tablet Take 1 tablet (10 mEq total) by mouth 2 (two) times daily. (Patient not taking: Reported on 07/05/2016) 6 tablet 0     Past Medical History:  Diagnosis Date  . Atrial fibrillation ([place])   . COPD (chronic obstructive pulmonary disease) ([place])   . Fall   . Glaucoma   . Hypothyroidism   . Neuropathy (HCC)    PERIPHERAL  . Osteoarthritis   . Osteoporosis   . Trimalleolar fracture    LEFT TRIMALLEOLAR FRACTURE, DISLOCATION WITH OBLIQUE FRACTURE OF THE DISTAL FIBULAR DIAPHYSIS, AND NOTED BEING MILDLY COMMINUTED    Past Surgical History:  Procedure Laterality Date  . ORIF TIBIA & FIBULA FRACTURES    . VAGINAL HYSTERECTOMY      History reviewed. No pertinent family history.  Social History   Social History  . Marital status: Widowed    Spouse name: N/A  . Number of children: N/A  . Years of education: N/A   Social History Main Topics  . Smoking status: Never Smoker  . Smokeless tobacco: Never Used  . Alcohol use No  . Drug use: No  . Sexual activity: Not Asked   Other Topics Concern  . None   Social History Narrative  . None    Temp:  [97.2 F (36.2 C)-98 F (36.7 C)] 97.6 F (36.4 C) (02/20 1100) Pulse Rate:  [43-141] 141 (02/20 1201) Resp:  [15-36] 29 (02/20 1201) BP: (91-175)/(62-152) 123/82 (02/20 1200) SpO2:  [96 %-100 %] 97 % (02/20 1201) Weight:  [152 lb 12.8 oz (69.3 kg)] 152 lb 12.8 oz (69.3 kg) (02/20 0428)  General:  Daughter at bedside. Appears mildly uncomfortable. Somewhat fidgety in bed. Integument:  Warm & dry. No rash on exposed skin.  Extremities:  No cyanosis or clubbing.  Lymphatics:  No appreciated cervical or supraclavicular lymphadenoapthy. HEENT:  Moist mucus membranes. No scleral icterus. Cardiovascular:  Irregular rhythm with regular rate. Trace edema. Unable to appreciate JVD given body positioning.  Telemetry: Atrial fibrillation. Pulmonary:  Coarse breath sounds bilaterally with crackles on the basis. Moderately increased work of breathing on nasal cannula oxygen. Abdomen: Soft. Normoactive bowel sounds. Nondistended.  Musculoskeletal:  No  joint deformity or effusion appreciated. Significant thoracic kyphosis. Neurological:  No meningismus or nuchal rigidity. Pupils symmetric and reactrive. Spontaneously moving all 4 extremities. Very hard of hearing. Psychiatric:  Patient is altered. Unable to provide me with the ear or name of the president. Does know she is in the hospital.  LINES/TUBES: Foley 2/18 >>> RUE DL PICC 2/18 >>> PIV  CBC Latest Ref Rng & Units 10/06/2016 10/05/2016 10/04/2016  WBC 4.0 - 10.5 K/uL 9.5 9.5 8.4  Hemoglobin 12.0 - 15.0 g/dL 9.4(L) 9.4(L) 9.2(L)  Hematocrit 36.0 - 46.0 % 28.9(L) 28.9(L) 28.7(L)  Platelets 150 - 400 K/uL 200 193 162    BMP Latest Ref Rng & Units 10/06/2016 10/05/2016 10/04/2016  Glucose 65 - 99 mg/dL 133(H) 172(H) 225(H)  BUN 6 - 20 mg/dL _0 Creatinine 0.44 - 1.00 mg/dL 1.30(H) 1.16(H) 1.12(H)  Sodium 135 - 145 mmol/L 141 140 139  Potassium 3.5 - 5.1 mmol/L 4.0 3.8 4.2  Chloride 101 - 111 mmol/L 109 105 106  CO2 22 - 32 mmol/L 21(L) 26 24  Calcium 8.9 - 10.3 mg/dL  8.3(L) 8.5(L) 8.2(L)   Hepatic Function Latest Ref Rng & Units 10/05/2016 05/07/2015 04/30/2015  Total Protein 6.5 - 8.1 g/dL 5.1(L) 6.8 7.5  Albumin 3.5 - 5.0 g/dL 2.6(L) 3.7 4.2  AST 15 - 41 U/L 60(H) 20 19  ALT 14 - 54 U/L 38 15 12(L)  Alk Phosphatase 38 - 126 U/L 66 44 53  Total Bilirubin 0.3 - 1.2 mg/dL 0.9 1.5(H) 1.5(H)    IMAGING/STUDIES: PFT 09/12/13: FVC 1.37 L (74%) FEV1 0.98 L (82%) FEV1/FVC 0.71 FEF 25-75 0.71 L (57%) negative bronchodilator response TLC 3.57 L (68%) RV 86% ERV 66%  PORT CXR 2/19:  Personally reviewed by me. Hazy bilateral basilar opacities. Mild alveolar opacities within the mid and upper lung zones as well.  MICROBIOLOGY: Blood Cultures x2 2/17 >>> MRSA PCR 2/18:  Negative  Urine Culture 2/18:  Negative  Urine Streptococcal Antigen 2/18:  Negative  Urine Legionella Antigen 2/20 >>>  ANTIBIOTICS: Rocephin 2/17 - 2/19 Zosyn 2/19 - 2/20 Azithromycin 2/17 >>> (stop date  2/25) Cefepime 2/20 >>>  SIGNIFICANT EVENTS: 02/17 - Admit  ASSESSMENT/PLAN:  83 y.o. female with known history of congestive heart failure, atrial fibrillation, and reported history of COPD. However, on my review of her pulmonary function testing she has mild restriction rather than evidence of airway obstruction. Additionally, without a significant bronchodilator response I find chronic COPD somewhat less likely than an acute exacerbation of her underlying CHF. This is further confirmed with her significant rise in BNP. I suspect the sodium concentration in the Zosyn she has been receiving is also contributing to some of her more recent worsening.  1. Acute hypoxic respiratory failure: Likely secondary to CHF exacerbation. Transitioning patient to ICU. Starting noninvasive positive pressure ventilation with BiPAP and Precedex drip at low dose rate for tolerance of BiPAP mask. Scheduling Lasix IV every 6 hours for diuresis. Weaning FiO2 for saturation greater than 90%. Close monitoring with electronic ICU given potential need for endotracheal intubation. 2. Acute on chronic diastolic congestive heart failure: Diuresing with scheduled Lasix IV. Holding Lopressor. Checking transthoracic echocardiogram. 3. Acute renal failure: Mild. Likely due to poor forward flow in the setting of atrial fibrillation and acute on chronic diastolic congestive heart failure. Maintaining mean arterial pressure greater than 65. Trending urine output with Foley catheter. Monitoring electrolytes and renal function daily. Checking serum magnesium now. 4. Chronic atrial fibrillation: Monitoring patient on telemetry. Holding Lopressor. Currently with supratherapeutic INR. Holding outpatient Coumadin. 5. Coagulopathy: Secondary to concomitant use of antibiotics and Coumadin. Trending INR daily. Vitamin K already ordered and administered by primary service. 6. Acute encephalopathy: Multifactorial from hypoxia and likely delirium.  Minimizing sedating medications. Discontinuing Ativan. Precedex drip for agitation. 7. Possible pneumonia: Doubtful given patient's history of symptoms for one month. Switching from Zosyn to cefepime and azithromycin given sodium associated with Zosyn. Checking Procalcitonin per algorithm. Low threshold to further de-escalate antibiotic regimen rapidly. Checking urine Legionella antigen. 8. Hypotension: Present initially but resolved. Discontinuing Lopressor. Monitoring vital signs per unit protocol. 9. Reported COPD: Not evident on available pulmonary function testing. Switching from duo nebs to Xopenex and Atrovent nebulized every 6 hours to minimize effect on atrial fibrillation. 10. Mild restrictive lung disease: Present on previous lung volumes. Likely multifactorial from chronic diastolic congestive heart failure as well as kyphosis. 11. Hypothyroidism: Continuing IV Synthroid. TSH 3.2 on admission. 12. Glaucoma: Continuing eyedrops.  Prophylaxis: Systemically anticoagulated with Coumadin. Protonix IV daily. Diet: Nothing by mouth. Code Status: Patient's daughter confirms full CODE STATUS  at bedside today. Disposition: Transferring patient to intensive care unit for further treatment and monitoring. Family Update: Daughter updated at bedside by myself regarding critical respiratory status and high probability of further decompensation.  I have personally spent a total of 32 minutes of critical care time today caring for the patient, updating family at bedside, & reviewing the aptient's electronic medical record.  Sonia Baller Ashok Cordia, M.D. [facility] Pulmonary & Critical Care Pager:  [phone] After 3pm or if no response, call [phone] 1:45 PM 10/06/16

## 2016-10-06 NOTE — Progress Notes (Signed)
Patient transported from room 4N13 to room 4N32, on bipap, without complications.

## 2016-10-07 ENCOUNTER — Inpatient Hospital Stay (HOSPITAL_COMMUNITY): Payer: Medicare Other

## 2016-10-07 DIAGNOSIS — J189 Pneumonia, unspecified organism: Secondary | ICD-10-CM

## 2016-10-07 DIAGNOSIS — I509 Heart failure, unspecified: Secondary | ICD-10-CM

## 2016-10-07 DIAGNOSIS — A419 Sepsis, unspecified organism: Principal | ICD-10-CM

## 2016-10-07 LAB — RENAL FUNCTION PANEL
ALBUMIN: 2.6 g/dL — AB (ref 3.5–5.0)
Anion gap: 12 (ref 5–15)
BUN: 21 mg/dL — ABNORMAL HIGH (ref 6–20)
CHLORIDE: 101 mmol/L (ref 101–111)
CO2: 26 mmol/L (ref 22–32)
Calcium: 8.8 mg/dL — ABNORMAL LOW (ref 8.9–10.3)
Creatinine, Ser: 1.54 mg/dL — ABNORMAL HIGH (ref 0.44–1.00)
GFR, EST AFRICAN AMERICAN: 34 mL/min — AB (ref >60)
GFR, EST NON AFRICAN AMERICAN: 29 mL/min — AB (ref >60)
Glucose, Bld: 133 mg/dL — ABNORMAL HIGH (ref 65–99)
PHOSPHORUS: 2.6 mg/dL (ref 2.5–4.6)
POTASSIUM: 3.3 mmol/L — AB (ref 3.5–5.1)
Sodium: 139 mmol/L (ref 135–145)

## 2016-10-07 LAB — ECHOCARDIOGRAM COMPLETE
Height: 65 in
WEIGHTICAEL: 2239.87 [oz_av]

## 2016-10-07 LAB — LEGIONELLA PNEUMOPHILA SEROGP 1 UR AG: L. pneumophila Serogp 1 Ur Ag: NEGATIVE

## 2016-10-07 LAB — HEPARIN LEVEL (UNFRACTIONATED): HEPARIN UNFRACTIONATED: 1.26 [IU]/mL — AB (ref 0.30–0.70)

## 2016-10-07 LAB — BLOOD GAS, ARTERIAL
ACID-BASE EXCESS: 2.1 mmol/L — AB (ref 0.0–2.0)
BICARBONATE: 25.1 mmol/L (ref 20.0–28.0)
DRAWN BY: 404151
FIO2: 40
O2 Saturation: 98 %
PATIENT TEMPERATURE: 98.6
PO2 ART: 98.9 mmHg (ref 83.0–108.0)
pCO2 arterial: 31.8 mmHg — ABNORMAL LOW (ref 32.0–48.0)
pH, Arterial: 7.508 — ABNORMAL HIGH (ref 7.350–7.450)

## 2016-10-07 LAB — PROCALCITONIN: PROCALCITONIN: 0.18 ng/mL

## 2016-10-07 LAB — PROTIME-INR
INR: 1.59
Prothrombin Time: 19.2 seconds — ABNORMAL HIGH (ref 11.4–15.2)

## 2016-10-07 LAB — MAGNESIUM: MAGNESIUM: 2 mg/dL (ref 1.7–2.4)

## 2016-10-07 MED ORDER — HEPARIN (PORCINE) IN NACL 100-0.45 UNIT/ML-% IJ SOLN
900.0000 [IU]/h | INTRAMUSCULAR | Status: DC
  Administered 2016-10-07 – 2016-10-08 (×2): 850 [IU]/h via INTRAVENOUS
  Administered 2016-10-10: 900 [IU]/h via INTRAVENOUS
  Administered 2016-10-11: 1050 [IU]/h via INTRAVENOUS
  Administered 2016-10-12: 900 [IU]/h via INTRAVENOUS
  Administered 2016-10-13: 1000 [IU]/h via INTRAVENOUS
  Administered 2016-10-14: 950 [IU]/h via INTRAVENOUS
  Filled 2016-10-07 (×7): qty 250

## 2016-10-07 MED ORDER — HEPARIN BOLUS VIA INFUSION
2000.0000 [IU] | Freq: Once | INTRAVENOUS | Status: AC
  Administered 2016-10-07: 2000 [IU] via INTRAVENOUS
  Filled 2016-10-07: qty 2000

## 2016-10-07 MED ORDER — FUROSEMIDE 10 MG/ML IJ SOLN
40.0000 mg | Freq: Once | INTRAMUSCULAR | Status: AC
  Administered 2016-10-07: 40 mg via INTRAVENOUS
  Filled 2016-10-07: qty 4

## 2016-10-07 MED ORDER — SODIUM CHLORIDE 0.9 % IV SOLN
30.0000 meq | Freq: Once | INTRAVENOUS | Status: AC
  Administered 2016-10-07: 30 meq via INTRAVENOUS
  Filled 2016-10-07: qty 15

## 2016-10-07 NOTE — Progress Notes (Signed)
SLP Cancellation Note  Patient Details Name: [name] MRN: [id number] DOB: May 04, 1927   Cancelled treatment:       Reason Eval/Treat Not Completed: Medical issues which prohibited therapy. Given tenuous respiratory status, pt not appropriate for PO trials today. Also would need f/u regarding esophageal dysphagia prior to any diet advancement. Will follow for readiness.    Deshawn Skelley, Katherene Ponto 10/07/2016, 12:09 PM

## 2016-10-07 NOTE — Progress Notes (Signed)
  Echocardiogram 2D Echocardiogram has been performed.  Darlina Sicilian M 10/07/2016, 2:38 PM

## 2016-10-07 NOTE — Consult Note (Signed)
[name] Attending Consult  Physician Requesting Consult:  [name], M.D. / [facility]  Date of Consult:  10/06/2016  Reason for Consult/Chief Complaint:  Acute Hypoxic Respiratory Failure  History of Presenting Illness:  83 y.o. female admitted with a known history of chronic atrial fibrillation, reported COPD, chronic diastolic congestive heart failure, hypothyroidism, hypertension, and depression. Patient history obtained through the electronic medical record as she is currently altered. She presented to the emergency department with a productive cough and generalized weakness. Cough and dyspnea developed 1 month prior and was treated with antibiotics. Patient failed to improve and was given a second course of antibiotics along with prednisone. Antibiotic was Levaquin. Levaquin finished the day of admission without significant improvement in cough or dyspnea. She had worsening generalized weakness and lethargy. Daughter had previously reported she was typically able to transfer from a power chair to bed or commode but had been too weak to perform this for 2 days prior to admission. Patient also had a poor appetite for several days. No known fevers or chills. No known chest pain or palpitations. Daughter did feel the patient had been confused somewhat in the days preceding her admission. No other new apparent focal deficits. In the emergency room the patient was noted to be hypoxic on room air and mildly hypotensive. QTC was noted to be prolonged. She also was noted to have a normocytic anemia and coagulopathy likely secondary to her recent antibiotics and chronic Coumadin. Patient's BNP initially was 459 but then rose to 1649 by the second day of admission. Patient's overall volume status continued to be increased. Her antihypertensive regimen was briefly held. Given patient's worsening respiratory status we were consulted to provide further assessment and support.  Subjective:  No  distress, frail, weak, not on NIMV now Neg 2 liters  Temp:  [96.6 F (35.9 C)-98.5 F (36.9 C)] 96.6 F (35.9 C) (02/21 0842) Pulse Rate:  [55-141] 78 (02/21 0910) Resp:  [16-35] 20 (02/21 0910) BP: (96-145)/(51-111) 118/69 (02/21 0910) SpO2:  [89 %-100 %] 97 % (02/21 0910) FiO2 (%):  [40 %] 40 % (02/21 0910) Weight:  [63.5 kg (139 lb 15.9 oz)] 63.5 kg (139 lb 15.9 oz) (02/21 0446)  General: weak Neuro:awake, responds to pain, improved HEENT: jvd  PULM: coarses bases CV: s1 s2 IRR no  GI: soft, BS wnl, no r/g Extremities: edema mild  LINES/TUBES: Foley 2/18 >>> RUE DL PICC 2/18 >>> PIV  CBC Latest Ref Rng & Units 10/06/2016 10/06/2016 10/05/2016  WBC 4.0 - 10.5 K/uL 10.9(H) 9.5 9.5  Hemoglobin 12.0 - 15.0 g/dL 9.5(L) 9.4(L) 9.4(L)  Hematocrit 36.0 - 46.0 % 28.4(L) 28.9(L) 28.9(L)  Platelets 150 - 400 K/uL 206 200 193    BMP Latest Ref Rng & Units 10/07/2016 10/06/2016 10/05/2016  Glucose 65 - 99 mg/dL 133(H) 133(H) 172(H)  BUN 6 - 20 mg/dL 21(H) 19 15  Creatinine 0.44 - 1.00 mg/dL 1.54(H) 1.30(H) 1.16(H)  Sodium 135 - 145 mmol/L 139 141 140  Potassium 3.5 - 5.1 mmol/L 3.3(L) 4.0 3.8  Chloride 101 - 111 mmol/L 101 109 105  CO2 22 - 32 mmol/L 26 21(L) 26  Calcium 8.9 - 10.3 mg/dL 8.8(L) 8.3(L) 8.5(L)   Hepatic Function Latest Ref Rng & Units 10/07/2016 10/05/2016 05/07/2015  Total Protein 6.5 - 8.1 g/dL - 5.1(L) 6.8  Albumin 3.5 - 5.0 g/dL 2.6(L) 2.6(L) 3.7  AST 15 - 41 U/L - 60(H) 20  ALT 14 - 54 U/L - 38 15  Alk Phosphatase 38 - 126 U/L - 66 44  Total Bilirubin 0.3 - 1.2 mg/dL - 0.9 1.5(H)    IMAGING/STUDIES: PFT 09/12/13: FVC 1.37 L (74%) FEV1 0.98 L (82%) FEV1/FVC 0.71 FEF 25-75 0.71 L (57%) negative bronchodilator response TLC 3.57 L (68%) RV 86% ERV 66%  PORT CXR 2/19:  Personally reviewed by me. Hazy bilateral basilar opacities. Mild alveolar opacities within the mid and upper lung zones as well.  MICROBIOLOGY: Blood Cultures x2 2/17 >>> MRSA PCR 2/18:  Negative   Urine Culture 2/18:  Negative  Urine Streptococcal Antigen 2/18:  Negative  Urine Legionella Antigen 2/20 >>>  ANTIBIOTICS: Rocephin 2/17 - 2/19 Zosyn 2/19 - 2/20 Azithromycin 2/17 >>> (stop date 2/25) Cefepime 2/20 >>>  SIGNIFICANT EVENTS: 02/17 - Admit  ASSESSMENT/PLAN:  83 y.o. female with known history of congestive heart failure, atrial fibrillation, and reported history of COPD. However, on my review of her pulmonary function testing she has mild restriction rather than evidence of airway obstruction. Presents with acute resp failure, chf  1. Acute hypoxic respiratory failure: Likely secondary to CHF exacerbation. Lasix had success with neg balance, ABG have shown resp alk, NIMV at bedside, can use PRN next 24 hours, may need scheduled if any declines, pcxr now and in am, may need [place] left base 2. Acute on chronic diastolic congestive heart failure: Diuresing with scheduled Lasix IV. Holding Lopressor. Echo pending, control rate 3. Acute renal failure: Mild. R/o a component over diuresis although she did benefit from , lasix reduce to 40 daily 4. Chronic atrial fibrillation: Monitoring patient on telemetry. Holding Lopressor, follow rate, add back heparin drip, inr corrected, some concern vit K resistance going forward 5. Coagulopathy: resolved, NO more vit K  6. Acute encephalopathy: avoi dbenzo, delirium, frequent orientation 7. Possible pneumonia: NO fevers, pct neg x 2, some rise 0.18 from arf, dc abx 8. Hypotension: resolved with holding BB, tele 9. Reported COPD: duo nebs to Xopenex and Atrovent nebulized every 6 hours to minimize effect on atrial fibrillation. 10. Mild restrictive lung disease: treat diastol hf 11. Hypothyroidism: Continuing IV Synthroid. TSH 3.2 on admission.  Ccm time 30 min  [name]. Adrienne Mould, MD, [place] Pgr: [place] [name]

## 2016-10-07 NOTE — Progress Notes (Signed)
ANTICOAGULATION CONSULT NOTE  Pharmacy Consult for heparin  Indication: atrial fibrillation  No Known Allergies  Patient Measurements: Height: 5\' 5"  (165.1 cm) Weight: 139 lb 15.9 oz (63.5 kg) IBW/kg (Calculated) : 57  Vital Signs: Temp: 96.6 F (35.9 C) (02/21 0842) Temp Source: Axillary (02/21 0842) BP: 118/69 (02/21 0910) Pulse Rate: 78 (02/21 0910)  Labs:  Recent Labs  10/05/16 0610 10/05/16 0655 10/06/16 0609 10/06/16 1320 10/07/16 0427  HGB 9.4*  --  9.4* 9.5*  --   HCT 28.9*  --  28.9* 28.4*  --   PLT 193  --  200 206  --   APTT  --  66*  --   --   --   LABPROT  --   --  61.4*  --  19.2*  INR  --   --  6.84*  --  1.59  CREATININE 1.16*  --  1.30*  --  1.54*    Estimated Creatinine Clearance: 23.2 mL/min (by C-G formula based on SCr of 1.54 mg/dL (H)).   Medical History: Past Medical History:  Diagnosis Date  . Atrial fibrillation (HCC)    chronic Coumadin.   Marland Kitchen COPD (chronic obstructive pulmonary disease) ([place])   . Glaucoma   . Hypothyroidism   . Neuropathy (HCC)    PERIPHERAL  . Osteoarthritis   . Osteoporosis   . Trimalleolar fracture    LEFT TRIMALLEOLAR FRACTURE, DISLOCATION WITH OBLIQUE FRACTURE OF THE DISTAL FIBULAR DIAPHYSIS, AND NOTED BEING MILDLY COMMINUTED    Assessment: 83 yo female with acute hypoxic respiratory failure on coumadiin PTA for afib and currently on hold (not able to tolerate po meds). Pharmacy consulted to dose heparin. She is noted s/p vitamin K 5mg  IV on 2/21 (INR 6.84; no bleeding noted) -INR today= 1.59, Hg= 9.5, plt= 206  Goal of Therapy:  Heparin level 0.3-0.7 units/ml Monitor platelets by anticoagulation protocol: Yes   Plan:  -heparin 2000 units x1 followed by 800 units/hr -Heparin level in 8 hours and daily wth CBC daily  Hildred Laser, Pharm D 10/07/2016 11:59 AM

## 2016-10-08 ENCOUNTER — Inpatient Hospital Stay (HOSPITAL_COMMUNITY): Payer: Medicare Other

## 2016-10-08 LAB — RENAL FUNCTION PANEL
ANION GAP: 9 (ref 5–15)
Albumin: 2.2 g/dL — ABNORMAL LOW (ref 3.5–5.0)
BUN: 18 mg/dL (ref 6–20)
CHLORIDE: 102 mmol/L (ref 101–111)
CO2: 29 mmol/L (ref 22–32)
CREATININE: 1.35 mg/dL — AB (ref 0.44–1.00)
Calcium: 8.4 mg/dL — ABNORMAL LOW (ref 8.9–10.3)
GFR, EST AFRICAN AMERICAN: 40 mL/min — AB (ref >60)
GFR, EST NON AFRICAN AMERICAN: 34 mL/min — AB (ref >60)
Glucose, Bld: 81 mg/dL (ref 65–99)
Phosphorus: 2.2 mg/dL — ABNORMAL LOW (ref 2.5–4.6)
Potassium: 3.1 mmol/L — ABNORMAL LOW (ref 3.5–5.1)
Sodium: 140 mmol/L (ref 135–145)

## 2016-10-08 LAB — CBC
HCT: 25.4 % — ABNORMAL LOW (ref 36.0–46.0)
Hemoglobin: 8.5 g/dL — ABNORMAL LOW (ref 12.0–15.0)
MCH: 31.5 pg (ref 26.0–34.0)
MCHC: 33.5 g/dL (ref 30.0–36.0)
MCV: 94.1 fL (ref 78.0–100.0)
PLATELETS: 155 10*3/uL (ref 150–400)
RBC: 2.7 MIL/uL — AB (ref 3.87–5.11)
RDW: 15.1 % (ref 11.5–15.5)
WBC: 7.4 10*3/uL (ref 4.0–10.5)

## 2016-10-08 LAB — MAGNESIUM: MAGNESIUM: 1.9 mg/dL (ref 1.7–2.4)

## 2016-10-08 LAB — HEPARIN LEVEL (UNFRACTIONATED): HEPARIN UNFRACTIONATED: 0.37 [IU]/mL (ref 0.30–0.70)

## 2016-10-08 LAB — PROTIME-INR
INR: 1.44
Prothrombin Time: 17.7 seconds — ABNORMAL HIGH (ref 11.4–15.2)

## 2016-10-08 LAB — BRAIN NATRIURETIC PEPTIDE: B NATRIURETIC PEPTIDE 5: 2042.8 pg/mL — AB (ref 0.0–100.0)

## 2016-10-08 LAB — PROCALCITONIN: PROCALCITONIN: 0.17 ng/mL

## 2016-10-08 MED ORDER — SODIUM CHLORIDE 0.9 % IV SOLN
30.0000 meq | Freq: Two times a day (BID) | INTRAVENOUS | Status: AC
  Administered 2016-10-08 (×2): 30 meq via INTRAVENOUS
  Filled 2016-10-08 (×3): qty 15

## 2016-10-08 MED ORDER — FUROSEMIDE 10 MG/ML IJ SOLN
40.0000 mg | Freq: Two times a day (BID) | INTRAMUSCULAR | Status: DC
  Administered 2016-10-08 – 2016-10-11 (×8): 40 mg via INTRAVENOUS
  Filled 2016-10-08 (×8): qty 4

## 2016-10-08 MED ORDER — POTASSIUM PHOSPHATES 15 MMOLE/5ML IV SOLN
20.0000 mmol | Freq: Once | INTRAVENOUS | Status: AC
  Administered 2016-10-08: 20 mmol via INTRAVENOUS
  Filled 2016-10-08: qty 6.67

## 2016-10-08 MED ORDER — SODIUM CHLORIDE 0.9 % IV SOLN
30.0000 meq | Freq: Once | INTRAVENOUS | Status: AC
  Administered 2016-10-08: 30 meq via INTRAVENOUS
  Filled 2016-10-08: qty 15

## 2016-10-08 NOTE — Progress Notes (Signed)
ANTICOAGULATION CONSULT NOTE  Pharmacy Consult for heparin  Indication: atrial fibrillation  No Known Allergies  Patient Measurements: Height: 5\' 5"  (165.1 cm) Weight: 139 lb 15.9 oz (63.5 kg) IBW/kg (Calculated) : 57  Vital Signs: Temp: 97.3 F (36.3 C) (02/22 1138) Temp Source: Axillary (02/22 1138) BP: 128/91 (02/22 1100) Pulse Rate: 87 (02/22 1100)  Labs:  Recent Labs  10/06/16 0609 10/06/16 1320 10/07/16 0427 10/07/16 2125 10/08/16 0330 10/08/16 1030  HGB 9.4* 9.5*  --   --  8.5*  --   HCT 28.9* 28.4*  --   --  25.4*  --   PLT 200 206  --   --  155  --   LABPROT 61.4*  --  19.2*  --  17.7*  --   INR 6.84*  --  1.59  --  1.44  --   HEPARINUNFRC  --   --   --  1.26*  --  0.37  CREATININE 1.30*  --  1.54*  --  1.35*  --     Estimated Creatinine Clearance: 26.4 mL/min (by C-G formula based on SCr of 1.35 mg/dL (H)).   Medical History: Past Medical History:  Diagnosis Date  . Atrial fibrillation (HCC)    chronic Coumadin.   Marland Kitchen COPD (chronic obstructive pulmonary disease) ([place])   . Glaucoma   . Hypothyroidism   . Neuropathy (HCC)    PERIPHERAL  . Osteoarthritis   . Osteoporosis   . Trimalleolar fracture    LEFT TRIMALLEOLAR FRACTURE, DISLOCATION WITH OBLIQUE FRACTURE OF THE DISTAL FIBULAR DIAPHYSIS, AND NOTED BEING MILDLY COMMINUTED    Assessment: 83 yo female with acute hypoxic respiratory failure on coumadiin PTA for afib and currently on hold (not able to tolerate po meds). Pharmacy consulted to dose heparin. She is noted s/p vitamin K 5mg  IV on 2/21 (INR 6.84; no bleeding noted) -INR 1.44, no bleeding noted, h/h low stable pltc ok Heparin drip 850 uts/hr HL 0/37 at goal Erroneous HL last pm drawn form PICC line where heparin infusing  Goal of Therapy:  Heparin level 0.3-0.7 units/ml Monitor platelets by anticoagulation protocol: Yes   Plan:  -heparin850 units/hr -Heparin level  daily wth CBC daily  Bonnita Nasuti Pharm.D. CPP, BCPS Clinical  Pharmacist [phone] 10/08/2016 12:05 PM

## 2016-10-08 NOTE — Progress Notes (Signed)
[facility] Attending Consult  Physician Requesting Consult:  Velvet Bathe, M.D. / [facility]  Date of Consult:  10/06/2016  Reason for Consult/Chief Complaint:  Acute Hypoxic Respiratory Failure   Subjective:   Keeps repeating "please help me"  Says she can't breath Not able to follow any thought process  Temp:  [98.1 F (36.7 C)-98.8 F (37.1 C)] 98.5 F (36.9 C) (02/22 0749) Pulse Rate:  [71-100] 86 (02/22 0900) Resp:  [21-36] 29 (02/22 0900) BP: (102-137)/(54-92) 119/72 (02/22 0900) SpO2:  [96 %-100 %] 100 % (02/22 0900) GENERAL: confused, cachectic, 83 year old female Eyes: anicteric sclerae, moist conjunctivae; PERRL, EOMI bilaterally. Mouth:  membranes and no mucosal ulcerations; normal hard and soft palate Neck: Trachea midline; neck supple, no JVD Lungs/chest:diffuse rales, with normal respiratory effort and no intercostal retractions CV: regular irreg , no MRGs  Abdomen: Soft, non-tender; no masses or HSM Extremities: LE peripheral edema no extremity lymphadenopathy Skin: Normal temperature, turgor and texture; no rash, ulcers or subcutaneous nodules Neuro/Psych: confused. Moves all extremities. Oriented only to person and place   CBC Recent Labs     10/06/16  0609  10/06/16  1320  10/08/16  0330  WBC  9.5  10.9*  7.4  HGB  9.4*  9.5*  8.5*  HCT  28.9*  28.4*  25.4*  PLT  200  206  155    Coag's Recent Labs     10/06/16  0609  10/07/16  0427  10/08/16  0330  INR  6.84*  1.59  1.44    BMET Recent Labs     10/06/16  0609  10/07/16  0427  10/08/16  0330  NA  141  139  140  K  4.0  3.3*  3.1*  CL  109  101  102  CO2  21*  26  29  BUN  19  21*  18  CREATININE  1.30*  1.54*  1.35*  GLUCOSE  133*  133*  81    Electrolytes Recent Labs     10/06/16  0609  10/06/16  0900  10/06/16  1325  10/07/16  0427  10/08/16  0330  CALCIUM  8.3*   --    --   8.8*  8.4*  MG   --    --   2.0  2.0  1.9  PHOS   --   2.6   --   2.6  2.2*     Sepsis Markers Recent Labs     10/06/16  1320  10/07/16  0427  10/08/16  0330  PROCALCITON  0.11  0.18  0.17    ABG Recent Labs     10/05/16  1455  10/06/16  2020  10/07/16  0312  PHART  7.446  7.558*  7.508*  PCO2ART  34.8  26.6*  31.8*  PO2ART  88.9  47.0*  98.9    Liver Enzymes Recent Labs     10/07/16  0427  10/08/16  0330  ALBUMIN  2.6*  2.2*    Cardiac Enzymes No results for input(s): TROPONINI, PROBNP in the last 72 hours.  Glucose No results for input(s): GLUCAP in the last 72 hours.  Imaging Dg Chest Port 1 View  Result Date: 10/08/2016 CLINICAL DATA:  CHF, community-acquired pneumonia, acute respiratory failure, COPD. EXAM: PORTABLE CHEST 1 VIEW COMPARISON:  Portable chest x-ray of October 07, 2016 FINDINGS: The lungs are reasonably well inflated. There are bilateral pleural effusions obscuring the hemidiaphragms. The  interstitial markings remain increased. The cardiac silhouette remains enlarged. The pulmonary vascularity is indistinct. There is calcification in the wall of the aortic arch. The right-sided PICC line tip projects over the cavoatrial junction. IMPRESSION: Allowing for differences in positioning there has not been significant interval change. Mild pulmonary interstitial edema. Bibasilar atelectasis or pneumonia with bilateral pleural effusions. Thoracic aortic atherosclerosis. Electronically Signed   By: David  Martinique M.D.   On: 10/08/2016 07:10   Dg Chest Port 1 View  Result Date: 10/07/2016 CLINICAL DATA:  Shortness of breath and respiratory distress. No complaints voice today. EXAM: PORTABLE CHEST 1 VIEW COMPARISON:  Portable chest x-ray of October 05, 2016 FINDINGS: Since the previous study the pulmonary interstitial markings have decreased. The retrocardiac region on the left is less dense but the hemidiaphragm remains obscured. Patchy density at the right lung base persists. The cardiac silhouette remains enlarged. The pulmonary  vascularity is less engorged. There is calcification in the wall of the aortic arch. The bony thorax exhibits no acute abnormality. IMPRESSION: Improving pulmonary interstitial edema bilaterally. Improving left lower lobe atelectasis or pneumonia. Persistent subsegmental atelectasis at the right lung base. Probable small right and moderate-sized left pleural effusion. Thoracic aortic atherosclerosis. Electronically Signed   By: David  Martinique M.D.   On: 10/07/2016 12:33    LINES/TUBES: Foley 2/18 >>> RUE DL PICC 2/18 >>> PIV   IMAGING/STUDIES: PFT 09/12/13: FVC 1.37 L (74%) FEV1 0.98 L (82%) FEV1/FVC 0.71 FEF 25-75 0.71 L (57%) negative bronchodilator response TLC 3.57 L (68%) RV 86% ERV 66%  PORT CXR 2/19:  Personally reviewed by me. Hazy bilateral basilar opacities. Mild alveolar opacities within the mid and upper lung zones as well. PCXR: 2/22 worsening bilateral aeration. Likely element of both edema and effusion but rotated film makes difficult to eval   MICROBIOLOGY: Blood Cultures x2 2/17 >>> MRSA PCR 2/18:  Negative  Urine Culture 2/18:  Negative  Urine Streptococcal Antigen 2/18:  Negative  Urine Legionella Antigen 2/20 >>>  ANTIBIOTICS: Rocephin 2/17 - 2/19 Zosyn 2/19 - 2/20 Azithromycin 2/17 >>> (stop date 2/25) Cefepime 2/20 >>>  SIGNIFICANT EVENTS: 02/17 - Admit  ASSESSMENT/PLAN:  Resolved issues Coagulopathy  Hypotension  Current issue  Acute hypoxic respiratory failure in setting of acute systolic heart failure w/ resultant pulmonary edema and pleural effusions.  -restrictive lung disease.  Plan Wean O2 as needed Aggressive diuresis  Pulse ox   Acute systolic HF w/ new CM EF now ~30%; superimposed on underlying diastolic HF Plan Push lasix  Hold BB to allow for volume removal   Afib Plan Cont tele BB when able Cont heparin for now  AKI. Tolerating diuretics  Plan Cont lasix F/u chemistry   Protein calorie malnutrition  Plan Will need to  further eval.   hypokalemia Plan Replace K Repeat Chem in am   Acute encephalopathy Plan Supportive care, holding all sedating meds   Hypothyroidism  Plan Cont synthroid   She is failing. Now w/ new CM. Will plan on meeting w/ family again. She should be DNR. We will cont aggressive diuresis but given her new found CM she would benefit also from more palliative approach.  Can go to SDU and back to  [facility].   Erick Colace ACNP-BC [place] Pager # [phone] OR # [phone] if no answer   STAFF NOTE: I, Merrie Roof, MD FACP have personally reviewed patient's available data, including medical history, events of note, physical examination and test results as part of my  evaluation. I have discussed with resident/NP and other care providers such as pharmacist, RN and RRT. In addition, I personally evaluated patient and elicited key findings of: awake, alert, moaning for help, cachectic frail, no resp distress, was neg 1.5 liters and crt reduced, echo showing worsening CHF sys fxn, maintain neg balance further, does have effusion but responding to lasix and no distress- NO indication thora at this stage, , pct down further and unimpressive x 3, NO abx, no nimv, I did discuss code status at length daughter, she agrees but pt stated "wanted heroics" , will consult pall care to continue discussions, back to [facility], floor, aggressive care would be futile, glad she is improved status over all  Lavon Paganini. Titus Mould, MD, [place] Pgr: [facility] 10/08/2016 10:51 AM

## 2016-10-08 NOTE — Progress Notes (Signed)
Spoke w/ daughter.  Now w/ new systolic CM She agrees w/ Full medical care but change to DNR   Erick Colace ACNP-BC [place] Pager # [phone] OR # [phone] if no answer

## 2016-10-08 NOTE — Progress Notes (Signed)
Pharmacist Heart Failure Core Measure Documentation  Assessment: [name] has an EF documented as 30-35% on echo 10/08/16.  Rationale: Heart failure patients with left ventricular systolic dysfunction (LVSD) and an EF < 40% should be prescribed an angiotensin converting enzyme inhibitor (ACEI) or angiotensin receptor blocker (ARB) at discharge unless a contraindication is documented in the medical record.  This patient is not currently on an ACEI or ARB for HF.  This note is being placed in the record in order to provide documentation that a contraindication to the use of these agents is present for this encounter.  ACE Inhibitor or Angiotensin Receptor Blocker is contraindicated (specify all that apply)  []   ACEI allergy AND ARB allergy []   Angioedema []   Moderate or severe aortic stenosis []   Hyperkalemia []   Hypotension []   Renal artery stenosis [x]   Worsening renal function, preexisting renal disease or dysfunction   [name] 10/08/2016 1:38 PM

## 2016-10-09 ENCOUNTER — Encounter (HOSPITAL_COMMUNITY): Payer: Self-pay | Admitting: Physician Assistant

## 2016-10-09 DIAGNOSIS — I429 Cardiomyopathy, unspecified: Secondary | ICD-10-CM

## 2016-10-09 DIAGNOSIS — Z515 Encounter for palliative care: Secondary | ICD-10-CM

## 2016-10-09 DIAGNOSIS — R5381 Other malaise: Secondary | ICD-10-CM

## 2016-10-09 DIAGNOSIS — R1310 Dysphagia, unspecified: Secondary | ICD-10-CM

## 2016-10-09 DIAGNOSIS — I4891 Unspecified atrial fibrillation: Secondary | ICD-10-CM

## 2016-10-09 DIAGNOSIS — I5021 Acute systolic (congestive) heart failure: Secondary | ICD-10-CM

## 2016-10-09 DIAGNOSIS — Z7189 Other specified counseling: Secondary | ICD-10-CM

## 2016-10-09 LAB — RENAL FUNCTION PANEL
ALBUMIN: 2.3 g/dL — AB (ref 3.5–5.0)
ANION GAP: 7 (ref 5–15)
BUN: 15 mg/dL (ref 6–20)
CO2: 30 mmol/L (ref 22–32)
Calcium: 8.5 mg/dL — ABNORMAL LOW (ref 8.9–10.3)
Chloride: 103 mmol/L (ref 101–111)
Creatinine, Ser: 1.12 mg/dL — ABNORMAL HIGH (ref 0.44–1.00)
GFR calc Af Amer: 50 mL/min — ABNORMAL LOW (ref >60)
GFR calc non Af Amer: 43 mL/min — ABNORMAL LOW (ref >60)
GLUCOSE: 84 mg/dL (ref 65–99)
PHOSPHORUS: 2.9 mg/dL (ref 2.5–4.6)
POTASSIUM: 4.3 mmol/L (ref 3.5–5.1)
Sodium: 140 mmol/L (ref 135–145)

## 2016-10-09 LAB — CBC
HEMATOCRIT: 27.4 % — AB (ref 36.0–46.0)
Hemoglobin: 8.9 g/dL — ABNORMAL LOW (ref 12.0–15.0)
MCH: 31.1 pg (ref 26.0–34.0)
MCHC: 32.5 g/dL (ref 30.0–36.0)
MCV: 95.8 fL (ref 78.0–100.0)
Platelets: 149 10*3/uL — ABNORMAL LOW (ref 150–400)
RBC: 2.86 MIL/uL — AB (ref 3.87–5.11)
RDW: 15.2 % (ref 11.5–15.5)
WBC: 7.9 10*3/uL (ref 4.0–10.5)

## 2016-10-09 LAB — CULTURE, BLOOD (ROUTINE X 2)
CULTURE: NO GROWTH
CULTURE: NO GROWTH

## 2016-10-09 LAB — MAGNESIUM: Magnesium: 1.8 mg/dL (ref 1.7–2.4)

## 2016-10-09 LAB — PROTIME-INR
INR: 1.19
PROTHROMBIN TIME: 15.2 s (ref 11.4–15.2)

## 2016-10-09 LAB — HEPARIN LEVEL (UNFRACTIONATED): HEPARIN UNFRACTIONATED: 0.26 [IU]/mL — AB (ref 0.30–0.70)

## 2016-10-09 MED ORDER — SPIRONOLACTONE 25 MG PO TABS
25.0000 mg | ORAL_TABLET | Freq: Every day | ORAL | Status: DC
  Administered 2016-10-09 – 2016-10-13 (×5): 25 mg via ORAL
  Filled 2016-10-09 (×5): qty 1

## 2016-10-09 MED ORDER — SODIUM CHLORIDE 0.9% FLUSH
10.0000 mL | INTRAVENOUS | Status: DC | PRN
  Administered 2016-10-09 – 2016-10-15 (×4): 10 mL
  Filled 2016-10-09 (×3): qty 40

## 2016-10-09 NOTE — Care Management Important Message (Signed)
Important Message  Patient Details  Name: [name] MRN: [id number] Date of Birth: 1926/09/30   Medicare Important Message Given:  Yes    Nathen May 10/09/2016, 2:12 PM

## 2016-10-09 NOTE — Progress Notes (Signed)
[place] for heparin  Indication: atrial fibrillation  No Known Allergies  Patient Measurements: Height: 5\' 7"  (170.2 cm) Weight: 145 lb 11.2 oz (66.1 kg) IBW/kg (Calculated) : 61.6  Vital Signs: Temp: 98.2 F (36.8 C) (02/23 0535) BP: 124/69 (02/23 0535) Pulse Rate: 90 (02/23 0535)  Labs:  Recent Labs  10/06/16 1320 10/07/16 0427 10/07/16 2125 10/08/16 0330 10/08/16 1030 10/09/16 0431 10/09/16 0432 10/09/16 0955  HGB 9.5*  --   --  8.5*  --  8.9*  --   --   HCT 28.4*  --   --  25.4*  --  27.4*  --   --   PLT 206  --   --  155  --  149*  --   --   LABPROT  --  19.2*  --  17.7*  --  15.2  --   --   INR  --  1.59  --  1.44  --  1.19  --   --   HEPARINUNFRC  --   --  1.26*  --  0.37  --   --  0.26*  CREATININE  --  1.54*  --  1.35*  --   --  1.12*  --     Estimated Creatinine Clearance: 34.4 mL/min (by C-G formula based on SCr of 1.12 mg/dL (H)).   Medical History: Past Medical History:  Diagnosis Date  . Atrial fibrillation (HCC)    chronic Coumadin.   Marland Kitchen COPD (chronic obstructive pulmonary disease) ([place])   . Glaucoma   . Hypothyroidism   . Neuropathy (HCC)    PERIPHERAL  . Osteoarthritis   . Osteoporosis   . Trimalleolar fracture    LEFT TRIMALLEOLAR FRACTURE, DISLOCATION WITH OBLIQUE FRACTURE OF THE DISTAL FIBULAR DIAPHYSIS, AND NOTED BEING MILDLY COMMINUTED    Assessment: 83 yo female with acute hypoxic respiratory failure on coumadiin PTA for afib and currently on hold (not able to tolerate po meds). Pharmacy consulted to dose heparin.  Heparin level this morning is slightly subtherapeutic at 0.26 on heparin infusion at 850 units/hr. CBC stable and INR 1.19.  Goal of Therapy:  Heparin level 0.3-0.7 units/ml Monitor platelets by anticoagulation protocol: Yes   Plan:  -Increase heparin infusion slightly to 900 units/hr  -Heparin level daily with CBC daily -Follow up on long term plans for anticoagulation     Vincenza Hews, PharmD, BCPS Clinical Pharmacist Phone for today - [place] - [phone]

## 2016-10-09 NOTE — Consult Note (Signed)
Consultation Note Date: 10/09/2016   Patient Name: [name]  DOB: March 29, 1927  MRN: [id number]  Age / Sex: 83 y.o., female  PCP: Leonides Sake, MD Referring Physician: Eugenie Filler, MD  Reason for Consultation: Establishing goals of care given aspiration, debility, COPD and heart failure. HPI/Patient Profile: 83 y.o. female  with past medical history of COPD, esophageal dysphagia, and an inability to walk, who was admitted on 10/03/2016 with an acute heart failure exacerbation, pneumonia (CAP vs ASP), and acute kidney injury.  Per the notes the patient had been suffering with a URI for a month.  It did not improve with outpatient antibiotics or prednisone.  Also over the past month she had been becoming progressively more weak, fatigued and had poor PO intake.   Clinical Assessment and Goals of Care: I met at bedside with the patient and her daughter Baker Janus.  The patient was inquisitive and slightly anxious so after talking with her for Loura Halt and I met privately.  Per Baker Janus -  Her mother was living at home with round the clock family support.  Baker Janus was with her mother during the day and [name] (patient's son) was there at night.  This week [name] has been hospitalized with an epidural abscess - so now his ability to care for his mother has been compromised.   Gail's daughter [name] also helps out.  Code Status:  Per Baker Janus her mother is extremely stubborn and always proclaimed "do everything!"  Now however her mother's ability to understand her current situation is compromised so, appropriately Baker Janus and [name] will make health care decisions for her.  Baker Janus and [name] do not want prolonged suffering and have accepted CCM's recommendation of DNR as appropriate for Hajira's current stage in life.  We discussed a MOST form.  I gave Baker Janus a copy of a MOST form to discuss with [name] before filling it out.  The MOST form will  act as a form of protection for Ludwika while still allowing appropriate medical treatment.  I spoke with Dr. Grandville Silos - his primary concern is Nykia's aspiration.  Baker Janus and I discussed this as well and I recommended against a feeding tube as Desa's condition is not reversible and a feeding tube will NOT prevent aspiration.  Baker Janus was inclined to agree with me but wanted to discuss the question with [name] before making a final decision.  I gave her a "Hard Choices" booklet for further study regarding feeding tubes and code status.  Baker Janus and I discussed disposition and support at home.  Maddelynn used to be able to TRANSFER herself at home using her arms.  If she is able to do that Baker Janus and [name] may consider taking her home again from the hospital with [place] Baker Janus does not feel they are ready for hospice yet).  If she is unable to transfer herself she will most likely need SNF for rehab (PT / OT / SLP).  The only rehab facility Baker Janus is comfortable with is [facility].  She  lives 4 miles from [facility] and had a good experience with her grand mother there.  Of note, Gail's family had a poor experience with Hospice 14 years ago - we discussed how things have likely improved since then and that when the family is ready to focus only on quality of life there are multiple Hospice groups to choose from.   Primary Decision Maker:  NEXT OF KIN (GAIL and TONY together)    SUMMARY OF RECOMMENDATIONS    MOST form was left with Baker Janus for consideration.  She needed to discuss several issues with [name] before making a final decision:     (1) how best to feed Dejah.  (2) SNF/[facility] vs home with home health  (3) completion of the MOST form.  Code Status/Advance Care Planning:  DNR    Symptom Management:   Per primary   Prognosis:   Unable to determine, but likely less than 6 month given aspiration, debility, heart failure and COPD - particularly if the family leans towards comfort.  Discharge  Planning: To Be Determined.  PT / OT consults ordered as the patient will likely need ST Rehab at a minimum.      Primary Diagnoses: Present on Admission: . CAP (community acquired pneumonia) . AKI (acute kidney injury) ([place]) . Hypokalemia . Atrial fibrillation ([place]) . Hypothyroidism . Normocytic anemia . Acute respiratory failure with hypoxia ([place]) . Chronic diastolic CHF (congestive heart failure) ([place]) . Hypotension . COPD with acute exacerbation ([place]) . Sepsis due to pneumonia ([place]) . Acute encephalopathy . Acute systolic heart failure ([place])   I have reviewed the medical record, interviewed the patient and family, and examined the patient. The following aspects are pertinent.  Past Medical History:  Diagnosis Date  . Atrial fibrillation (HCC)    chronic Coumadin.   Marland Kitchen COPD (chronic obstructive pulmonary disease) ([place])   . Glaucoma   . Hypothyroidism   . Neuropathy (HCC)    PERIPHERAL  . Osteoarthritis   . Osteoporosis   . Trimalleolar fracture    LEFT TRIMALLEOLAR FRACTURE, DISLOCATION WITH OBLIQUE FRACTURE OF THE DISTAL FIBULAR DIAPHYSIS, AND NOTED BEING MILDLY COMMINUTED   Social History   Social History  . Marital status: Widowed    Spouse name: N/A  . Number of children: N/A  . Years of education: N/A   Social History Main Topics  . Smoking status: Never Smoker  . Smokeless tobacco: Never Used  . Alcohol use No  . Drug use: No  . Sexual activity: Not Asked   Other Topics Concern  . None   Social History Narrative  . None   Family History  Problem Relation Age of Onset  . CAD Mother   . Heart failure Father   . Heart failure Brother   . CAD Brother    Scheduled Meds: . budesonide  0.5 mg Nebulization BID  . dorzolamide  1 drop Left Eye BID  . furosemide  40 mg Intravenous BID  . ipratropium  0.5 mg Nebulization Q6H  . levalbuterol  0.63 mg Nebulization Q6H  . levothyroxine  37.5 mcg Intravenous Daily  . mouth rinse  15 mL Mouth Rinse BID    . pilocarpine  1 drop Left Eye BID  . sodium chloride flush  10-40 mL Intracatheter Q12H  . spironolactone  25 mg Oral Daily  . timolol  1 drop Left Eye BID  . Travoprost (BAK Free)  1 drop Left Eye QHS   Continuous Infusions: . heparin 900 Units/hr (10/09/16 1129)  PRN Meds:.acetaminophen, levalbuterol, sodium chloride flush No Known Allergies Review of Systems  Patient pleasantly confused.  States she has had pain all over but it is better now.  Breathing ok.  Bowels moving ok.  Feels weak.  Physical Exam  Thin frail female, fatigued, pleasantly confused. CV irreg irreg Resp on oxygen, NAD Abdomen soft, nt Ext able to move all 4  Vital Signs: BP (!) 115/54   Pulse 77   Temp 97.5 F (36.4 C) (Oral)   Resp 20   Ht _0  (1.702 m)   Wt 66.1 kg (145 lb 11.2 oz)   SpO2 100%   BMI 22.82 kg/m  Pain Assessment: No/denies pain POSS *See Group Information*: 1-Acceptable,Awake and alert     SpO2: SpO2: 100 % O2 Device:SpO2: 100 % O2 Flow Rate: .O2 Flow Rate (L/min): 4 L/min  IO: Intake/output summary:  Intake/Output Summary (Last 24 hours) at 10/09/16 1542 Last data filed at 10/09/16 1321  Gross per 24 hour  Intake           545.41 ml  Output             1400 ml  Net          -854.59 ml    LBM: Last BM Date: 10/09/16 Baseline Weight: Weight: 66.3 kg (146 lb 1.6 oz) Most recent weight: Weight: 66.1 kg (145 lb 11.2 oz)     Palliative Assessment/Data:   Flowsheet Rows   Flowsheet Row Most Recent Value  Intake Tab  Referral Department  Hospitalist  Unit at Time of Referral  Med/Surg Unit  Palliative Care Primary Diagnosis  Cardiac  Date Notified  10/09/16  Palliative Care Type  New Palliative care  Reason Not Seen  Consult cancelled  Reason for referral  Clarify Goals of Care  Date of Admission  10/04/16  Date first seen by Palliative Care  10/09/16  # of days Palliative referral response time  0 Day(s)  # of days IP prior to Palliative referral  5  Clinical  Assessment  Palliative Performance Scale Score  30%  Psychosocial & Spiritual Assessment  Palliative Care Outcomes  Patient/Family meeting held?  Yes  Palliative Care Outcomes  Clarified goals of care      Time In: 2:00 Time Out: 4:00 Time Total: 120 Greater than 50%  of this time was spent counseling and coordinating care related to the above assessment and plan.  Signed by: Imogene Burn, PA-C Palliative Medicine Pager: [phone]  Please contact Palliative Medicine Team phone at [phone] for questions and concerns.  For individual provider: See Shea Evans

## 2016-10-09 NOTE — Consult Note (Signed)
CARDIOLOGY CONSULT NOTE   Patient ID: [name] MRN: [id number] DOB/AGE: 81/29/1928 81 y.o.  Admit date: 10/03/2016  Requesting Physician: Dr. Grandville Silos Primary Physician:   Leonides Sake, MD Primary Cardiologist: New - seen by Dr. Mare Ferrari in consult in 2012 . Reason for Consultation: new systolic CHF   HPI: [name] is a 83 y.o. female with a history of chronic atrial fibrillation on coumadin, restrictive lung disease, chronic diastolic CHF, HTN, hypothyroidism and depression who presented to [facility] on 10/04/16 with cough and weakness. Found to have acute hypoxic respiratory failure 2/2 acute CHF. 2D ECHO this admission shows new systolic dysfunction and cardiology consulted.   She has long history of chronic afib on coumadin and diastolic CHF. She has never followed with a cardiologist and per review of a consult note by Dr. Mare Ferrari in 2012, she has refused to establish care with a cardiologist over the years. She has been maintained on coumadin by her PCP.   Per review of notes, cough and dyspnea developed 1 month prior to admission and she was treated with antibiotics (levaquin). Patient failed to improve and was given a second course of antibiotics along with prednisone. Levaquin finished the day of admission without significant improvement in cough or dyspnea. She had worsening generalized weakness and lethargy. Daughter had previously reported she was typically able to transfer from a power chair to bed or commode but had been too weak to perform this for 2 days prior to admission. Patient also had a poor appetite for several days.  She was admitted through the [facility] on 10/04/16. In the emergency room the patient was noted to be hypoxic on room air and mildly hypotensive. QTC was noted to be prolonged. She also was noted to have a normocytic anemia and coagulopathy likely secondary to her recent antibiotics and chronic Coumadin. Patient's BNP initially was 459--> 1649  --> 2042. She had some hypotension and home antihypertensives were held.   PCCM was consulted on 10/07/16 given worsening respiratory status and encephalopathy. Patient was moaning "please help me I cannot breath." 2D ECHO was ordered which showed new LV dysfunction with EF 30-35%, basal to mid anteroseptal akinesis, basal to mid inferoseptal akinesis and basal to mid inferior akinesis, mod AR, mild MR, severe R/LAE, mild RV dilation and systolic dysfunction, and mild pulm HTN. CXR yesterday showed mild pulmonary edema with bilateral pleural effusions, and bibasilar atelectases vs PNA.   PCCM and IM have recommended that the patient become DNR. Palliative care has been consulted for help in establishing goals of care. But current status is full medical support with DNR. Cardiology consulted for help in management of CHF.   Today she is sitting up in bed with daughter at bedside. She is alert and oriented. She is feeling much better. She said she felt "lousy" yesterday but could not really qualify why. She is not feeling SOB today. Just cold. No chest pain. No LE edema, orthopnea or PND. No dizziness or syncope. Cough has improved. Just feels lousy all over still but much  Better than even this AM. No further history was able to be obtained.     Past Medical History:  Diagnosis Date  . Atrial fibrillation (HCC)    chronic Coumadin.   Marland Kitchen COPD (chronic obstructive pulmonary disease) ([place])   . Glaucoma   . Hypothyroidism   . Neuropathy (HCC)    PERIPHERAL  . Osteoarthritis   . Osteoporosis   . Trimalleolar fracture  LEFT TRIMALLEOLAR FRACTURE, DISLOCATION WITH OBLIQUE FRACTURE OF THE DISTAL FIBULAR DIAPHYSIS, AND NOTED BEING MILDLY COMMINUTED     Past Surgical History:  Procedure Laterality Date  . COLONOSCOPY W/ POLYPECTOMY  11/2010   Dr Fuller Plan.  multiple polyps:cecal, transverse, sigmoid.  largest 27mm, path: tubular adenomas without HGD.  mild sigmoid diverticulosis.   Marland Kitchen ORIF TIBIA & FIBULA  FRACTURES  2016  . VAGINAL HYSTERECTOMY      No Known Allergies  I have reviewed the patient's current medications . budesonide  0.5 mg Nebulization BID  . dorzolamide  1 drop Left Eye BID  . furosemide  40 mg Intravenous BID  . ipratropium  0.5 mg Nebulization Q6H  . levalbuterol  0.63 mg Nebulization Q6H  . levothyroxine  37.5 mcg Intravenous Daily  . mouth rinse  15 mL Mouth Rinse BID  . pilocarpine  1 drop Left Eye BID  . sodium chloride flush  10-40 mL Intracatheter Q12H  . timolol  1 drop Left Eye BID  . Travoprost (BAK Free)  1 drop Left Eye QHS   . heparin 900 Units/hr (10/09/16 1129)   acetaminophen, levalbuterol, sodium chloride flush  Prior to Admission medications   Medication Sig Start Date End Date Taking? Authorizing Provider  acetaminophen (TYLENOL) 325 MG tablet Take 325 mg by mouth daily as needed for headache (pain).   Yes Historical Provider, MD  albuterol (PROVENTIL) (2.5 MG/3ML) 0.083% nebulizer solution Take 2.5 mg by nebulization every 4 (four) hours as needed for wheezing or shortness of breath.    Yes Historical Provider, MD  benzonatate (TESSALON) 100 MG capsule Take 100 mg by mouth 3 (three) times daily. 09/28/16  Yes Historical Provider, MD  budesonide (PULMICORT) 0.5 MG/2ML nebulizer solution Take 0.5 mg by nebulization 2 (two) times daily. 09/14/16  Yes Historical Provider, MD  Dextromethorphan-Guaifenesin (MUCINEX DM PO) Take 10 mLs by mouth every 12 (twelve) hours.   Yes Historical Provider, MD  dorzolamide (TRUSOPT) 2 % ophthalmic solution Place 1 drop into the left eye 2 (two) times daily. 09/11/16  Yes Historical Provider, MD  ergocalciferol (VITAMIN D2) 50000 UNITS capsule Take 50,000 Units by mouth every Thursday. thursday   Yes Historical Provider, MD  FLUoxetine (PROZAC) 20 MG capsule Take 20 mg by mouth daily. 09/11/16  Yes Historical Provider, MD  furosemide (LASIX) 20 MG tablet Take 1 tablet (20 mg total) by mouth 2 (two) times daily. Patient  taking differently: Take 20-40 mg by mouth See admin instructions. Take 1 tablet (20 mg) by mouth every morning and 2 tablets (40 mg) at 2pm 07/01/16  Yes Virgel Manifold, MD  gabapentin (NEURONTIN) 600 MG tablet Take 600 mg by mouth at bedtime. 03/27/15  Yes Historical Provider, MD  levofloxacin (LEVAQUIN) 500 MG tablet Take 500 mg by mouth daily. 10 day course to be completed 10/04/16 09/28/16  Yes Historical Provider, MD  levothyroxine (SYNTHROID, LEVOTHROID) 75 MCG tablet Take 75 mcg by mouth daily before breakfast.   Yes Historical Provider, MD  lisinopril (PRINIVIL,ZESTRIL) 5 MG tablet Take 5 mg by mouth daily. 06/12/16  Yes Historical Provider, MD  loperamide (IMODIUM) 2 MG capsule Take 2-4 mg by mouth See admin instructions. Take 2 capsules (4 mg) by mouth every morning, may also take 1 capsule (2 mg) during the day as needed for loose stools   Yes Historical Provider, MD  LUTEIN-ZEAXANTHIN PO Take 1 tablet by mouth daily with supper.   Yes Historical Provider, MD  methocarbamol (ROBAXIN) 500 MG tablet Take 500  mg by mouth 2 (two) times daily.   Yes Historical Provider, MD  metoprolol tartrate (LOPRESSOR) 25 MG tablet Take 1 tablet (25 mg total) by mouth 2 (two) times daily. Patient taking differently: Take 12.5 mg by mouth 2 (two) times daily.  12/29/10 07/05/17 Yes Peter M Martinique, MD  omeprazole (PRILOSEC) 40 MG capsule Take 40 mg by mouth daily.   Yes Historical Provider, MD  ondansetron (ZOFRAN-ODT) 4 MG disintegrating tablet Take 4 mg by mouth every 8 (eight) hours as needed for nausea or vomiting.  09/23/16  Yes Historical Provider, MD  oxyCODONE-acetaminophen (PERCOCET/ROXICET) 5-325 MG per tablet Take 1-2 tablets by mouth every 4 (four) hours as needed for pain. Patient taking differently: Take 1 tablet by mouth 2 (two) times daily.  05/24/12  Yes John-Adam Bonk, MD  pilocarpine (PILOCAR) 4 % ophthalmic solution Place 1 drop into the left eye 2 (two) times daily.    Yes Historical Provider, MD    spironolactone (ALDACTONE) 25 MG tablet Take 25 mg by mouth daily.   Yes Historical Provider, MD  timolol (TIMOPTIC) 0.5 % ophthalmic solution Place 1 drop into the left eye 2 (two) times daily. 09/11/16  Yes Historical Provider, MD  travoprost, benzalkonium, (TRAVATAN) 0.004 % ophthalmic solution Place 1 drop into the left eye at bedtime.    Yes Historical Provider, MD  warfarin (COUMADIN) 2 MG tablet Take 1 mg by mouth daily with supper.   Yes Historical Provider, MD  potassium chloride (Adrienne-DUR) 10 MEQ tablet Take 1 tablet (10 mEq total) by mouth 2 (two) times daily. Patient not taking: Reported on 07/05/2016 07/01/16   Virgel Manifold, MD     Social History   Social History  . Marital status: Widowed    Spouse name: N/A  . Number of children: N/A  . Years of education: N/A   Occupational History  . Not on file.   Social History Main Topics  . Smoking status: Never Smoker  . Smokeless tobacco: Never Used  . Alcohol use No  . Drug use: No  . Sexual activity: Not on file   Other Topics Concern  . Not on file   Social History Narrative  . No narrative on file    Family Status  Relation Status  . Mother Deceased  . Father Deceased  . Brother    Family History  Problem Relation Age of Onset  . CAD Mother   . Heart failure Father   . Heart failure Brother   . CAD Brother    ROS:  Full 14 point review of systems complete and found to be negative unless listed above.  Physical Exam: Blood pressure 124/69, pulse 90, temperature 98.2 F (36.8 C), resp. rate 20, height 5\' 7"  (1.702 m), weight 145 lb 11.2 oz (66.1 kg), SpO2 94 %.  General: Well developed, well nourished, female in no acute distress, elderly and frail  Head: Eyes PERRLA, No xanthomas.   Normocephalic and atraumatic, oropharynx without edema or exudate.  Lungs: decreased breath sounds at bases Heart:  S1 S2, no rub/gallop, Heart irregular rate and rhythm with S1, S2  No murmur. pulses are 2+ extrem.   Neck: No  carotid bruits. No lymphadenopathy. no JVD. Abdomen: Bowel sounds present, abdomen soft and non-tender without masses or hernias noted. Msk:  No spine or cva tenderness. No weakness, no joint deformities or effusions. Extremities: No clubbing or cyanosis. No LE edema.  Neuro: Alert and oriented X 3. No focal deficits noted. Psych:  [name]  affect, responds appropriately Skin: No rashes or lesions noted.  Labs:   Lab Results  Component Value Date   WBC 7.9 10/09/2016   HGB 8.9 (L) 10/09/2016   HCT 27.4 (L) 10/09/2016   MCV 95.8 10/09/2016   PLT 149 (L) 10/09/2016    Recent Labs  10/09/16 0431  INR 1.19    Recent Labs Lab 10/05/16 0610  10/09/16 0432  NA 140  < > 140  Adrienne 3.8  < > 4.3  CL 105  < > 103  CO2 26  < > 30  BUN 15  < > 15  CREATININE 1.16*  < > 1.12*  CALCIUM 8.5*  < > 8.5*  PROT 5.1*  --   --   BILITOT 0.9  --   --   ALKPHOS 66  --   --   ALT 38  --   --   AST 60*  --   --   GLUCOSE 172*  < > 84  ALBUMIN 2.6*  < > 2.3*  < > = values in this interval not displayed. Magnesium  Date Value Ref Range Status  10/09/2016 1.8 1.7 - 2.4 mg/dL Final   No results for input(s): CKTOTAL, CKMB, TROPONINI in the last 72 hours. No results for input(s): TROPIPOC in the last 72 hours. Pro B Natriuretic peptide (BNP)  Date/Time Value Ref Range Status  11/23/2010 04:45 AM 157.0 (H) 0.0 - 100.0 pg/mL Final  11/22/2010 10:10 AM 454.0 (H) 0.0 - 100.0 pg/mL Final   No results found for: CHOL, HDL, LDLCALC, TRIG No results found for: DDIMER No results found for: LIPASE, AMYLASE TSH  Date/Time Value Ref Range Status  10/04/2016 05:48 AM 3.249 0.350 - 4.500 uIU/mL Final    Comment:    Performed by a 3rd Generation assay with a functional sensitivity of <=0.01 uIU/mL.  11/26/2010 05:10 AM 0.205 (L) 0.350 - 4.500 uIU/mL Final   Vitamin B-12  Date/Time Value Ref Range Status  10/04/2016 05:48 AM 989 (H) 180 - 914 pg/mL Final    Comment:    (NOTE) This assay is not  validated for testing neonatal or myeloproliferative syndrome specimens for Vitamin B12 levels.    Folate  Date/Time Value Ref Range Status  10/04/2016 05:48 AM 10.5 >5.9 ng/mL Final   Ferritin  Date/Time Value Ref Range Status  10/04/2016 05:48 AM 272 11 - 307 ng/mL Final   TIBC  Date/Time Value Ref Range Status  10/04/2016 05:48 AM 196 (L) 250 - 450 ug/dL Final   Iron  Date/Time Value Ref Range Status  10/04/2016 05:48 AM 33 28 - 170 ug/dL Final   Retic Ct Pct  Date/Time Value Ref Range Status  10/04/2016 05:48 AM 1.4 0.4 - 3.1 % Final    Echo: 10/07/2016 LV EF: 30% -   35% Study Conclusions - Left ventricle: The cavity size was normal. Wall thickness was   normal. Indeterminant diastolic function (atrial fibrillation).   Basal to mid anteroseptal akinesis. Basal to mid inferoseptal   akinesis. Basal to mid inferior akinesis. Systolic function was   moderately to severely reduced. The estimated ejection fraction   was in the range of 30% to 35%. - Aortic valve: There was no stenosis. There was moderate   regurgitation. - Mitral valve: Mildly calcified annulus. There was mild   regurgitation. - Left atrium: The atrium was severely dilated. - Right ventricle: The cavity size was mildly dilated. Systolic   function was mildly reduced. - Right atrium: The atrium  was severely dilated. - Tricuspid valve: Peak RV-RA gradient (S): 31 mm Hg. - Pulmonary arteries: PA peak pressure: 46 mm Hg (S). - Systemic veins: IVC measured 2.5 cm with < 50% respirophasic   variation, suggesting RA pressure 15 mmHg. - Line: A venous catheter was visualized in the superior vena cava,   with its tip in the right atrium. No abnormal features noted. Impressions: - The patient was in atrial fibrillation. EF 30-35% with somewhat   unusual wall motion abnormality pattern => basal to mid   anteroseptal, basal to mid inferoseptal, and basal to mid   inferior akinesis. Mildly dilated RV with  mildly decreased   systolic function. Moderate aortic insufficiency. Mild mitral   regurgitation. Severe biatrial enlargement. Mild pulmonary   hypertension.   ECG:  Atrial fib HR 100, prolonged QT  Radiology:  Dg Chest Port 1 View  Result Date: 10/08/2016 CLINICAL DATA:  CHF, community-acquired pneumonia, acute respiratory failure, COPD. EXAM: PORTABLE CHEST 1 VIEW COMPARISON:  Portable chest x-ray of October 07, 2016 FINDINGS: The lungs are reasonably well inflated. There are bilateral pleural effusions obscuring the hemidiaphragms. The interstitial markings remain increased. The cardiac silhouette remains enlarged. The pulmonary vascularity is indistinct. There is calcification in the wall of the aortic arch. The right-sided PICC line tip projects over the cavoatrial junction. IMPRESSION: Allowing for differences in positioning there has not been significant interval change. Mild pulmonary interstitial edema. Bibasilar atelectasis or pneumonia with bilateral pleural effusions. Thoracic aortic atherosclerosis. Electronically Signed   By: David  Martinique M.D.   On: 10/08/2016 07:10   Dg Chest Port 1 View  Result Date: 10/07/2016 CLINICAL DATA:  Shortness of breath and respiratory distress. No complaints voice today. EXAM: PORTABLE CHEST 1 VIEW COMPARISON:  Portable chest x-ray of October 05, 2016 FINDINGS: Since the previous study the pulmonary interstitial markings have decreased. The retrocardiac region on the left is less dense but the hemidiaphragm remains obscured. Patchy density at the right lung base persists. The cardiac silhouette remains enlarged. The pulmonary vascularity is less engorged. There is calcification in the wall of the aortic arch. The bony thorax exhibits no acute abnormality. IMPRESSION: Improving pulmonary interstitial edema bilaterally. Improving left lower lobe atelectasis or pneumonia. Persistent subsegmental atelectasis at the right lung base. Probable small right and  moderate-sized left pleural effusion. Thoracic aortic atherosclerosis. Electronically Signed   By: David  Martinique M.D.   On: 10/07/2016 12:33    ASSESSMENT AND PLAN:    Principal Problem:   Sepsis due to pneumonia [facility]) Active Problems:   Atrial fibrillation ([place])   Hypothyroidism   CAP (community acquired pneumonia)   AKI (acute kidney injury) ([place])   Hypokalemia   Normocytic anemia   Acute respiratory failure with hypoxia (HCC)   Chronic diastolic CHF (congestive heart failure) (HCC)   Hypotension   COPD with acute exacerbation (HCC)   Acute encephalopathy  [name] is a 83 y.o. female with a history of chronic atrial fibrillation on coumadin, restrictive lung disease, chronic diastolic CHF, HTN, hypothyroidism and depression who presented to [facility] on 10/04/16 with cough and weakness. Found to have acute hypoxic respiratory failure 2/2 acute CHF. 2D ECHO this admission shows new systolic dysfunction and cardiology consulted.   New systolic CHF: EF 99991111 with curious WMA. No known CAD but there was aortic atherosclerosis noted on previous imaging. Likely not a candidate for further ischemic work up. Currently on IV lasix 40mg  BID. Net positive 1.1L. Creat improving. -- Home lopressor 25mg   BID, spiro 25mg  daily and lisinopril 5mg  daily were held on admission given hypotension. All these should be added back as BP tolerates but would change Lopressor to Toprol XL. Will add back spirolactone to augment diuresis and treat hypokalemia.   Chronic afib: HRs have been relatively well controlled. INR was supratheraputic on admission and this was reversed. Now INR 1.19. Continue coumadin for 5 (CHF, HTN, age, vasc diz).   HTN: BPs were soft on admission but now improved. Will add back home spiro for CM and diuresis  Hypothyroidism: TSH normal   Dispo: agree with diuresis and palliative care consult   Signed: Angelena Form, PA-C 10/09/2016 11:30 AM  Pager [phone]  Pt seen and  examined  I agree with findings of [name] Pt 83 yo female hx of HTN, chronic afib admitted with resp failure  Found to have LVEF of 30 to 35%  CXR and exam consistent with CHF   On exam today she is rel comfortable   Neck  JVP is increased  Cardiac exam Irreg irreg  No S3 Lungs with mild basilar rales  Ext without edema  1  Acute systolic CHF  Given age and medical issuse I agree with conservative Rx with medical Rx  Still with some volume increase on exam though she is rel comfortable  Continue IV lasix for now  Add aldactone    2  Atrial fib  Remains in afib  Follow rates  Continue coumadin    WIll continue to follow .    Dorris Carnes

## 2016-10-09 NOTE — Progress Notes (Signed)
PROGRESS NOTE    [name]  [id number] DOB: 1927/07/30 DOA: 10/03/2016 PCP: Leonides Sake, MD    Brief Narrative: Patient is a 83 year old female history of CHF, A. fib on chronic anticoagulation, COPD who presented with acute hypoxic respiratory failure likely secondary to acute CHF exacerbation. Patient also noted to be in acute renal failure. Because of also possible pneumonia. Patient seen by critical care and was being managed by critical care. Patient with some clinical improvement and transferred to the floor. Patient noted to have a new cardiomyopathy with a EF of approximately 30%. Cardiology consulted. Patient also with some dysphagia. Palliative care consultation for goals of care.   Assessment & Plan:   Principal Problem:   Acute respiratory failure with hypoxia (HCC) Active Problems:   Acute systolic heart failure (HCC)   Cardiomyopathy ([place])   Atrial fibrillation (HCC)   Hypothyroidism   CAP (community acquired pneumonia)   AKI (acute kidney injury) ([place])   Hypokalemia   Normocytic anemia   Chronic diastolic CHF (congestive heart failure) (HCC)   Hypotension   COPD with acute exacerbation (HCC)   Sepsis due to pneumonia ([place])   Acute encephalopathy  #1 acute respiratory failure with hypoxia secondary to acute systolic heart failure and pleural effusions in the setting of restrictive lung disease. Patient initially admitted to critical care and initially being treated for probable pneumonia and COPD exacerbation. Chest x-ray consistent with volume overload. Patient with elevated BNP. 2-D echo which was done with a new cardiomyopathy with a EF of 30-35% with wall motion abnormalities. Patient with multiple comorbidities. Patient asking for aggressive treatment. Patient on IV Lasix 40 mg twice a day with a urine output of 2.470 L over the past 24 hours. Patient also with issues of dysphagia with esophageal deficits. Continue diuresis with IV Lasix. Consult with  cardiology for further evaluation and management.  #2 hypokalemia Likely secondary to diuresis. Improved.  #3 atrial fibrillation Currently rate controlled.. Beta blocker on hold in order to allow fluid removal by critical care. On heparin for anticoagulation.  #4 acute renal failure Likely secondary to acute systolic heart failure. Improving with diuresis. Follow.  #5 protein calorie malnutrition Patient has been assessed by speech therapy. Patient with some significant esophageal deficits and at increased risk for aspiration. Patientplaced on full liquid diet. Monitor.  #6 dysphagia Patient with some esophageal deficits. Patient with increased risk of aspiration. Patient currently on a full liquid diet. Speech therapy following. Follow.  #7 acute encephalopathy Improved. Sedating medications on hold.  #8 hypothyroidism Continue IV Synthroid.  #9 prognosis Patient with a poor prognosis. Patient presented with acute respiratory failure initially felt to be likely secondary to pneumonia and possible COPD exacerbation. Patient noted to be in new acute systolic heart failure with a new cardiomyopathy with a EF of approximately 30%. Due to patient's comorbidities likely a poor candidate for any invasive testing. Patient was aggressive treatment at this time. Cardiology has been consulted. Patient also noted to have a dysphagia. Palliative care consultation for goals of care.   DVT prophylaxis: Heparin Code Status: DNR Family Communication: updated patient and daughter at bedside. Disposition Plan: Pending PT evaluation and palliative care evaluation. Probable skilled nursing facility.   Consultants:   Cardiology pending 10/09/2016  Palliative care pending 10/09/2016  Procedures:  PFT 09/12/13: FVC 1.37 L (74%) FEV1 0.98 L (82%) FEV1/FVC 0.71 FEF 25-75 0.71 L (57%) negative bronchodilator response TLC 3.57 L (68%) RV 86% ERV 66%  PORT CXR 2/19:  Personally reviewed by me. Hazy  bilateral basilar opacities. Mild alveolar opacities within the mid and upper lung zones as well. PCXR: 2/22 worsening bilateral aeration. Likely element of both edema and effusion but rotated film makes difficult to eval   2-D echo 10/07/2016--- EF [id number], systolic function moderately to severely reduced, severely dilated left atrium. Mildly reduced right ventricular systolic function. Basal to mid anterior septal akinesis. Basal to mid inferior septal akinesis. Basal to mid inferior akinesis.  LINES/TUBES: Foley 2/18 >>> RUE DL PICC 2/18 >>> PIV    Antimicrobials:  Rocephin 2/17 - 2/19 Zosyn 2/19 - 2/20 Azithromycin 2/17 >>> (stop date 2/21) Cefepime 2/20 >>>2/21  Blood Cultures x2 2/17 >>> MRSA PCR 2/18:  Negative  Urine Culture 2/18:  Negative  Urine Streptococcal Antigen 2/18:  Negative  Urine Legionella Antigen 2/20 >>>   Subjective: Patient states shortness of breath improved from yesterday. Patient denies any chest pain.  Objective: Vitals:   10/09/16 0231 10/09/16 0432 10/09/16 0535 10/09/16 0850  BP:   124/69   Pulse:   90   Resp:   20   Temp:   98.2 F (36.8 C)   TempSrc:      SpO2: 98%  100% 94%  Weight:  66.1 kg (145 lb 11.2 oz)    Height:        Intake/Output Summary (Last 24 hours) at 10/09/16 1257 Last data filed at 10/09/16 0914  Gross per 24 hour  Intake              499 ml  Output             1900 ml  Net            -1401 ml   Filed Weights   10/07/16 0446 10/08/16 1703 10/09/16 0432  Weight: 63.5 kg (139 lb 15.9 oz) 71.6 kg (157 lb 12.8 oz) 66.1 kg (145 lb 11.2 oz)    Examination:  General exam: Appears calm and comfortable  Respiratory system: Decreased breath sounds in the bases.  Cardiovascular system: Irregularly irregular. No JVD, murmurs, rubs, gallops or clicks. No pedal edema. Gastrointestinal system: Abdomen is nondistended, soft and nontender. No organomegaly or masses felt. Normal bowel sounds heard. Central nervous system:  Alert and oriented. No focal neurological deficits. Extremities: Symmetric 5 x 5 power. Skin: No rashes, lesions or ulcers Psychiatry: Judgement and insight appear fair. Mood & affect appropriate.     Data Reviewed: I have personally reviewed following labs and imaging studies  CBC:  Recent Labs Lab 10/03/16 1953 10/04/16 0548 10/05/16 0610 10/06/16 0609 10/06/16 1320 10/08/16 0330 10/09/16 0431  WBC  --  8.4 9.5 9.5 10.9* 7.4 7.9  NEUTROABS 6.3 7.7  --   --  9.7*  --   --   HGB  --  9.2* 9.4* 9.4* 9.5* 8.5* 8.9*  HCT  --  28.7* 28.9* 28.9* 28.4* 25.4* 27.4*  MCV  --  95.0 95.1 94.4 94.7 94.1 95.8  PLT  --  162 193 200 206 155 [id number]*   Basic Metabolic Panel:  Recent Labs Lab 10/05/16 0610 10/06/16 0609 10/06/16 0900 10/06/16 1325 10/07/16 0427 10/08/16 0330 10/09/16 0431 10/09/16 0432  NA 140 141  --   --  139 140  --  140  K 3.8 4.0  --   --  3.3* 3.1*  --  4.3  CL 105 109  --   --  101 102  --  103  CO2 26 21*  --   --  26 29  --  30  GLUCOSE 172* 133*  --   --  133* 81  --  84  BUN 15 19  --   --  21* 18  --  15  CREATININE 1.16* 1.30*  --   --  1.54* 1.35*  --  1.12*  CALCIUM 8.5* 8.3*  --   --  8.8* 8.4*  --  8.5*  MG 2.0  --   --  2.0 2.0 1.9 1.8  --   PHOS  --   --  2.6  --  2.6 2.2*  --  2.9   GFR: Estimated Creatinine Clearance: 34.4 mL/min (by C-G formula based on SCr of 1.12 mg/dL (H)). Liver Function Tests:  Recent Labs Lab 10/05/16 0610 10/07/16 0427 10/08/16 0330 10/09/16 0432  AST 60*  --   --   --   ALT 38  --   --   --   ALKPHOS 66  --   --   --   BILITOT 0.9  --   --   --   PROT 5.1*  --   --   --   ALBUMIN 2.6* 2.6* 2.2* 2.3*   No results for input(s): LIPASE, AMYLASE in the last 168 hours. No results for input(s): AMMONIA in the last 168 hours. Coagulation Profile:  Recent Labs Lab 10/04/16 0548 10/06/16 0609 10/07/16 0427 10/08/16 0330 10/09/16 0431  INR 3.44 6.84* 1.59 1.44 1.19   Cardiac Enzymes: No results for  input(s): CKTOTAL, CKMB, CKMBINDEX, TROPONINI in the last 168 hours. BNP (last 3 results) No results for input(s): PROBNP in the last 8760 hours. HbA1C: No results for input(s): HGBA1C in the last 72 hours. CBG: No results for input(s): GLUCAP in the last 168 hours. Lipid Profile: No results for input(s): CHOL, HDL, LDLCALC, TRIG, CHOLHDL, LDLDIRECT in the last 72 hours. Thyroid Function Tests: No results for input(s): TSH, T4TOTAL, FREET4, T3FREE, THYROIDAB in the last 72 hours. Anemia Panel: No results for input(s): VITAMINB12, FOLATE, FERRITIN, TIBC, IRON, RETICCTPCT in the last 72 hours. Sepsis Labs:  Recent Labs Lab 10/04/16 0019 10/04/16 0548 10/06/16 1320 10/07/16 0427 10/08/16 0330  PROCALCITON  --   --  0.11 0.18 0.17  LATICACIDVEN 0.8 1.1  --   --   --     Recent Results (from the past 240 hour(s))  Culture, blood (routine x 2) Call MD if unable to obtain prior to antibiotics being given     Status: None   Collection Time: 10/03/16  7:50 PM  Result Value Ref Range Status   Specimen Description BLOOD LEFT ANTECUBITAL  Final   Special Requests BOTTLES DRAWN AEROBIC AND ANAEROBIC 5CC EA  Final   Culture NO GROWTH 5 DAYS  Final   Report Status 10/09/2016 FINAL  Final  Culture, blood (routine x 2) Call MD if unable to obtain prior to antibiotics being given     Status: None   Collection Time: 10/03/16 10:00 PM  Result Value Ref Range Status   Specimen Description BLOOD RIGHT WRIST  Final   Special Requests BOTTLES DRAWN AEROBIC AND ANAEROBIC 5CC EA  Final   Culture NO GROWTH 5 DAYS  Final   Report Status 10/09/2016 FINAL  Final  Culture, Urine     Status: None   Collection Time: 10/04/16  1:23 AM  Result Value Ref Range Status   Specimen Description URINE, CATHETERIZED  Final   Special Requests NONE  Final   Culture NO GROWTH  Final  Report Status 10/05/2016 FINAL  Final  MRSA PCR Screening     Status: None   Collection Time: 10/04/16  1:53 AM  Result Value Ref  Range Status   MRSA by PCR NEGATIVE NEGATIVE Final    Comment:        The GeneXpert MRSA Assay (FDA approved for NASAL specimens only), is one component of a comprehensive MRSA colonization surveillance program. It is not intended to diagnose MRSA infection nor to guide or monitor treatment for MRSA infections.          Radiology Studies: Dg Chest Port 1 View  Result Date: 10/08/2016 CLINICAL DATA:  CHF, community-acquired pneumonia, acute respiratory failure, COPD. EXAM: PORTABLE CHEST 1 VIEW COMPARISON:  Portable chest x-ray of October 07, 2016 FINDINGS: The lungs are reasonably well inflated. There are bilateral pleural effusions obscuring the hemidiaphragms. The interstitial markings remain increased. The cardiac silhouette remains enlarged. The pulmonary vascularity is indistinct. There is calcification in the wall of the aortic arch. The right-sided PICC line tip projects over the cavoatrial junction. IMPRESSION: Allowing for differences in positioning there has not been significant interval change. Mild pulmonary interstitial edema. Bibasilar atelectasis or pneumonia with bilateral pleural effusions. Thoracic aortic atherosclerosis. Electronically Signed   By: David  Martinique M.D.   On: 10/08/2016 07:10        Scheduled Meds: . budesonide  0.5 mg Nebulization BID  . dorzolamide  1 drop Left Eye BID  . furosemide  40 mg Intravenous BID  . ipratropium  0.5 mg Nebulization Q6H  . levalbuterol  0.63 mg Nebulization Q6H  . levothyroxine  37.5 mcg Intravenous Daily  . mouth rinse  15 mL Mouth Rinse BID  . pilocarpine  1 drop Left Eye BID  . sodium chloride flush  10-40 mL Intracatheter Q12H  . spironolactone  25 mg Oral Daily  . timolol  1 drop Left Eye BID  . Travoprost (BAK Free)  1 drop Left Eye QHS   Continuous Infusions: . heparin 900 Units/hr (10/09/16 1129)     LOS: 6 days    Time spent: 36 mins    Augusta Hilbert, MD [facility] Pager [phone]  [phone]  If 7PM-7AM, please contact night-coverage [email or web address] Password [facility] 10/09/2016, 12:57 PM

## 2016-10-09 NOTE — Evaluation (Signed)
Physical Therapy Evaluation and Discharge  Patient Details Name: [name] MRN: [id number] DOB: April 20, 1927 Today's Date: 10/09/2016   History of Present Illness  Pt is an 83 y/o female who presents with SOB and intermittent periods of confusion. She was admitted with sepsis 2 CAP and COPD exacerbation.   Clinical Impression  Patient evaluated by Physical Therapy with no further acute PT needs identified. All education has been completed and the patient has no further questions. At the time of PT eval, pt demonstrated functional mobility at a level that is likely her baseline. She reports that she does not walk, rarely stands, and has assist from her granddaughter for all ADL's. Pt transferred bed>chair via lateral scoot transfer with mod +2 assist. Appears pt is at her baseline of function. Recommending return home with family support, however if they can no longer provide this level of care, then SNF would be appropriate. See below for any follow-up Physical Therapy or equipment needs. PT is signing off. Thank you for this referral.     Follow Up Recommendations No PT follow up;Supervision/Assistance - 24 hour    Equipment Recommendations  None recommended by PT    Recommendations for Other Services       Precautions / Restrictions Precautions Precautions: Fall Restrictions Weight Bearing Restrictions: No      Mobility  Bed Mobility Overal bed mobility: Needs Assistance Bed Mobility: Supine to Sit     Supine to sit: Max assist     General bed mobility comments: Assist with bed pad to scoot around to EOB. Pt required assist to advance LE's laterally.   Transfers Overall transfer level: Needs assistance   Transfers: Lateral/Scoot Transfers          Lateral/Scoot Transfers: Mod assist;+2 physical assistance General transfer comment: Pt able to assist in advancing hips to laterally transfer to drop arm recliner. +2 assist provided with bed pad for support.    Ambulation/Gait             General Gait Details: Unable. Pt does not walk at baseline.   Stairs            Wheelchair Mobility    Modified Rankin (Stroke Patients Only)       Balance Overall balance assessment: Needs assistance Sitting-balance support: Feet supported;No upper extremity supported Sitting balance-Leahy Scale: Poor Sitting balance - Comments: Requires assist initially to gain/maintain sitting balance.  Postural control: Left lateral lean (Anterior lean)                                   Pertinent Vitals/Pain Pain Assessment: Faces Faces Pain Scale: Hurts a little bit Pain Location: back, during positioning Pain Descriptors / Indicators: Discomfort Pain Intervention(s): Limited activity within patient's tolerance;Repositioned    Home Living Family/patient expects to be discharged to:: Private residence Living Arrangements: Children Available Help at Discharge: Family;Available PRN/intermittently Type of Home: House Home Access: Ramped entrance     Home Layout: One level Home Equipment: Youth worker - 2 wheels;Shower seat - built in      Prior Function Level of Independence: Needs assistance   Gait / Transfers Assistance Needed: Transfers only with assistance  ADL's / Homemaking Assistance Needed: Granddaughter assists with bathing and dressing.         Hand Dominance        Extremity/Trunk Assessment   Upper Extremity Assessment Upper Extremity Assessment: Generalized weakness  Lower Extremity Assessment Lower Extremity Assessment: Generalized weakness;RLE deficits/detail RLE Deficits / Details: Pt reports she can stand to transfer but usually does not. Grossly, very weak bilaterally.     Cervical / Trunk Assessment Cervical / Trunk Assessment: Kyphotic  Communication      Cognition Arousal/Alertness: Awake/alert Behavior During Therapy: WFL for tasks assessed/performed Overall Cognitive  Status: No family/caregiver present to determine baseline cognitive functioning (Likely baseline)                      General Comments      Exercises     Assessment/Plan    PT Assessment Patent does not need any further PT services  PT Problem List         PT Treatment Interventions      PT Goals (Current goals can be found in the Care Plan section)  Acute Rehab PT Goals Patient Stated Goal: Pt did not state goals PT Goal Formulation: All assessment and education complete, DC therapy    Frequency     Barriers to discharge        Co-evaluation               End of Session Equipment Utilized During Treatment: Oxygen Activity Tolerance: Patient tolerated treatment well Patient left: in chair;with call bell/phone within reach;with chair alarm set Nurse Communication: Mobility status PT Visit Diagnosis: Muscle weakness (generalized) (M62.81)         Time: [id number] PT Time Calculation (min) (ACUTE ONLY): 22 min   Charges:   PT Evaluation $PT Eval Moderate Complexity: 1 Procedure     PT G CodesThelma Comp 10/09/2016, 3:05 PM   Rolinda Roan, PT, DPT Acute Rehabilitation Services Pager: [phone]

## 2016-10-09 NOTE — Progress Notes (Signed)
  Speech Language Pathology Treatment: Dysphagia  Patient Details Name: [name] MRN: [id number] DOB: 1926/12/05 Today's Date: 10/09/2016 Time: [id number] SLP Time Calculation (min) (ACUTE ONLY): 16 min  Assessment / Plan / Recommendation Clinical Impression  Ms. Sitzer was alert and requested sips of water. Observed pt with thin liquids via cup and straw and Dys 1 (pureed) solids. No indications of airway compromise with cup sips or pureed solids, however, straw sips resulted in immediate throat clearing. Belching noted throughout treatment due to esophageal impairments (observed during MBS on 10/04/2016). Given esophageal deficits, pt will likely tolerate thin liquids versus solids due to ease of transit and decreased residue. Suspect pt will intermittently and chronically aspirate post prandial given esophageal impairments. Pt may benefit from Palliative Care consult. Discussed diet recommendations with Dr. Grandville Silos. Recommend upgrading to full liquid diet (no straws), meds crushed. Ensure small sips/bites and upright positioning during and after mealtime. ST will continue to f/u for treatment and family education.   HPI HPI: [name] a 83 y.o.femalewith medical history significant forchronic atrial fibrillation on warfarin, COPD, chronic diastolic CHF, hypothyroidism, hypertension, and depression who now presents to the emergency department for evaluation of productive cough and generalized weakness. Patient had reportedly developed a productive cough and dyspnea approximately one month ago and was started on antibiotics at that time. Her symptoms failed to improve and she was recently started on a second course of antibiotics with Levaquin, in addition to prednisone. She completed the prednisone and finished her Levaquin today, but reports no improvement in her cough or dyspnea, and worsening in her generalized weakness and lethargy. Chest x-ray reveals a new air space consolidation in  the lingula concerning for pneumonia. Respiratory status improved in the ED, patient on [place]. Admitted to the stepdown unit for ongoing evaluation and management of sepsis secondary to pneumonia. No prior history of dysphagia per chart. Referred by MD for swallowing evaluation due to coughing with fluids, recurrent pneumonia. MBS on 10/04/16 revealed esophageal impairments and SLP recommended GI consult, further esophageal assessment, and NPO with ice chips PRN after oral care.       SLP Plan  Continue with current plan of care       Recommendations  Diet recommendations: Other(comment) (full liquids) Liquids provided via: Cup;No straw Medication Administration: Crushed with puree Supervision: Staff to assist with self feeding;Full supervision/cueing for compensatory strategies Compensations: Slow rate;Small sips/bites Postural Changes and/or Swallow Maneuvers: Seated upright 90 degrees                Oral Care Recommendations: Oral care BID Follow up Recommendations: Skilled Nursing facility SLP Visit Diagnosis: Dysphagia, pharyngoesophageal phase (R13.14) Plan: Continue with current plan of care       [place] , [place] 10/09/2016, 8:36 AM

## 2016-10-10 DIAGNOSIS — I420 Dilated cardiomyopathy: Secondary | ICD-10-CM

## 2016-10-10 DIAGNOSIS — R0603 Acute respiratory distress: Secondary | ICD-10-CM

## 2016-10-10 DIAGNOSIS — I480 Paroxysmal atrial fibrillation: Secondary | ICD-10-CM

## 2016-10-10 DIAGNOSIS — R0600 Dyspnea, unspecified: Secondary | ICD-10-CM

## 2016-10-10 LAB — CBC
HEMATOCRIT: 29.2 % — AB (ref 36.0–46.0)
Hemoglobin: 9.7 g/dL — ABNORMAL LOW (ref 12.0–15.0)
MCH: 31.6 pg (ref 26.0–34.0)
MCHC: 33.2 g/dL (ref 30.0–36.0)
MCV: 95.1 fL (ref 78.0–100.0)
PLATELETS: 155 10*3/uL (ref 150–400)
RBC: 3.07 MIL/uL — ABNORMAL LOW (ref 3.87–5.11)
RDW: 15.1 % (ref 11.5–15.5)
WBC: 7 10*3/uL (ref 4.0–10.5)

## 2016-10-10 LAB — PROTIME-INR
INR: 1.22
PROTHROMBIN TIME: 15.5 s — AB (ref 11.4–15.2)

## 2016-10-10 LAB — RENAL FUNCTION PANEL
Albumin: 2.5 g/dL — ABNORMAL LOW (ref 3.5–5.0)
Anion gap: 9 (ref 5–15)
BUN: 15 mg/dL (ref 6–20)
CALCIUM: 9.1 mg/dL (ref 8.9–10.3)
CHLORIDE: 99 mmol/L — AB (ref 101–111)
CO2: 31 mmol/L (ref 22–32)
CREATININE: 0.98 mg/dL (ref 0.44–1.00)
GFR calc Af Amer: 58 mL/min — ABNORMAL LOW (ref >60)
GFR calc non Af Amer: 50 mL/min — ABNORMAL LOW (ref >60)
Glucose, Bld: 87 mg/dL (ref 65–99)
Phosphorus: 2.8 mg/dL (ref 2.5–4.6)
Potassium: 4 mmol/L (ref 3.5–5.1)
SODIUM: 139 mmol/L (ref 135–145)

## 2016-10-10 LAB — HEPARIN LEVEL (UNFRACTIONATED)
Heparin Unfractionated: 0.23 IU/mL — ABNORMAL LOW (ref 0.30–0.70)
Heparin Unfractionated: 0.35 IU/mL (ref 0.30–0.70)

## 2016-10-10 LAB — MAGNESIUM: Magnesium: 1.9 mg/dL (ref 1.7–2.4)

## 2016-10-10 MED ORDER — LISINOPRIL 5 MG PO TABS
5.0000 mg | ORAL_TABLET | Freq: Every day | ORAL | Status: DC
  Administered 2016-10-10 – 2016-10-11 (×2): 5 mg via ORAL
  Filled 2016-10-10 (×2): qty 1

## 2016-10-10 MED ORDER — WARFARIN - PHARMACIST DOSING INPATIENT
Freq: Every day | Status: DC
  Administered 2016-10-12 – 2016-10-15 (×3)

## 2016-10-10 MED ORDER — LEVOTHYROXINE SODIUM 75 MCG PO TABS
75.0000 ug | ORAL_TABLET | Freq: Every day | ORAL | Status: DC
  Administered 2016-10-11 – 2016-10-15 (×5): 75 ug via ORAL
  Filled 2016-10-10 (×5): qty 1

## 2016-10-10 MED ORDER — LEVALBUTEROL HCL 0.63 MG/3ML IN NEBU
0.6300 mg | INHALATION_SOLUTION | Freq: Three times a day (TID) | RESPIRATORY_TRACT | Status: DC
  Administered 2016-10-10 – 2016-10-14 (×12): 0.63 mg via RESPIRATORY_TRACT
  Filled 2016-10-10 (×12): qty 3

## 2016-10-10 MED ORDER — IPRATROPIUM BROMIDE 0.02 % IN SOLN
0.5000 mg | Freq: Three times a day (TID) | RESPIRATORY_TRACT | Status: DC
  Administered 2016-10-10 – 2016-10-14 (×12): 0.5 mg via RESPIRATORY_TRACT
  Filled 2016-10-10 (×12): qty 2.5

## 2016-10-10 MED ORDER — WARFARIN SODIUM 1 MG PO TABS
1.0000 mg | ORAL_TABLET | Freq: Once | ORAL | Status: AC
  Administered 2016-10-10: 1 mg via ORAL
  Filled 2016-10-10: qty 1

## 2016-10-10 NOTE — Progress Notes (Signed)
ANTICOAGULATION CONSULT NOTE  Pharmacy Consult for heparin  Indication: atrial fibrillation  No Known Allergies  Patient Measurements: Height: 5\' 7"  (170.2 cm) Weight: 136 lb 6.4 oz (61.9 kg) IBW/kg (Calculated) : 61.6  Vital Signs: Temp: 98.3 F (36.8 C) (02/23 2126) Temp Source: Oral (02/23 2126) BP: 116/66 (02/23 2126) Pulse Rate: 85 (02/23 2126)  Labs:  Recent Labs  10/08/16 0330 10/08/16 1030 10/09/16 0431 10/09/16 0432 10/09/16 0955 10/10/16 0417  HGB 8.5*  --  8.9*  --   --  9.7*  HCT 25.4*  --  27.4*  --   --  29.2*  PLT 155  --  149*  --   --  155  LABPROT 17.7*  --  15.2  --   --  15.5*  INR 1.44  --  1.19  --   --  1.22  HEPARINUNFRC  --  0.37  --   --  0.26* 0.23*  CREATININE 1.35*  --   --  1.12*  --   --     Estimated Creatinine Clearance: 34.4 mL/min (by C-G formula based on SCr of 1.12 mg/dL (H)).  Assessment: 83 yo female with acute hypoxic respiratory failure on coumadiin PTA for afib and currently on hold (not able to tolerate po meds). Pt on heparin while coumadin on hold.  Heparin level remains subtherapeutic (0.23) on gtt at 900 units/hr. No bleeding reported per RN. RN reports that heparin line beeping a few times during the night.   Goal of Therapy:  Heparin level 0.3-0.7 units/ml Monitor platelets by anticoagulation protocol: Yes   Plan:  -Increase heparin infusion to 1050 units/hr  -Will f/u 8 hr heparin level  Sherlon Handing, PharmD, BCPS Clinical pharmacist, pager [phone] 10/10/2016 5:34 AM

## 2016-10-10 NOTE — Evaluation (Signed)
Physical Therapy Evaluation Patient Details Name: [name] MRN: [id number] DOB: 1927/07/12 Today's Date: 10/10/2016   History of Present Illness  Pt is an 83 y/o female who presents with SOB and intermittent periods of confusion. She was admitted with sepsis 2 CAP and COPD exacerbation. Pts daughter present for this eval.  pt was doing her transfers on her own - all scoot transfers - to toilet and chair and shower etc.  they have had a lot of homehealth in the past and have good set up for pt. she waters her plants and bakes cookies - etc - all from wheelchair level.  Clinical Impression  Pt was reevaluated today.  Family present.  Family wanting pt to be able to transfer to get home (she was abel to do it before) and now pt more SOB and weak.  She did scoot transfer with mod assist (but would do better in her own setting).  Daughter having hard time making decision on best next step for pt - her brother was admitted last night for surgery and is one of pts caregiver and daughter wants to talk to him about best next plans for pt. She knows that they dont have much time to make decision.  I feel SNF would be best for pt to get stronger - except for the fact that pt would do better with her home set up and pt not wanting to go to SNF.  Daughter to talk to her brother and they will decide on a plan for pt.  If she goes home will need HHPT.      Follow Up Recommendations SNF;Home health PT;Supervision/Assistance - 24 hour    Equipment Recommendations  None recommended by PT    Recommendations for Other Services       Precautions / Restrictions Precautions Precautions: Fall Restrictions Weight Bearing Restrictions: No Other Position/Activity Restrictions: pt hasnt walked in 2 years but does stand - holding onto sink/counter to get clothes adjusted etc per daughter       Mobility  Bed Mobility                  Transfers                 General transfer comment: pt  sitting in chair.  i ahd her try to scoot to bed from drop arm recliner. she could get into the right position but unable to do the transfer on her own.  pt needed mod assist to get up over the one inch height difference and harder to slide on sheets without pants - trying to keep chuck under her.  Pt said in her home setting s he could do this .  Pt did get SOB trying to do the scooting.  Daughter observed transfer  Ambulation/Gait                Stairs            Wheelchair Mobility    Modified Rankin (Stroke Patients Only)       Balance       Sitting balance - Comments: Requires assist initially to gain/maintain sitting balance. pt sits with severe kyphosis                                     Pertinent Vitals/Pain Pain Assessment: No/denies pain Pain Intervention(s): Repositioned    Home Living Family/patient expects to  be discharged to:: Private residence Living Arrangements: Children (pt has 2 main caregivers - son was admitted last night and daughter wanting to discuss all plans through him) Available Help at Discharge: Family;Available PRN/intermittently Type of Home: House Home Access: Ramped entrance     Home Layout: One level Home Equipment: Youth worker - 2 wheels;Shower seat - built in      Prior Function Level of Independence: Needs assistance   Gait / Transfers Assistance Needed: Transfers only with assistance           Hand Dominance        Extremity/Trunk Assessment        Lower Extremity Assessment Lower Extremity Assessment: Generalized weakness    Cervical / Trunk Assessment Cervical / Trunk Assessment: Kyphotic  Communication   Communication: No difficulties  Cognition Arousal/Alertness: Awake/alert Behavior During Therapy: WFL for tasks assessed/performed Overall Cognitive Status: History of cognitive impairments - at baseline                      General Comments      Exercises      Assessment/Plan    PT Assessment Patient needs continued PT services  PT Problem List Decreased strength;Decreased activity tolerance;Decreased mobility;Cardiopulmonary status limiting activity;Decreased safety awareness       PT Treatment Interventions Functional mobility training;Therapeutic activities;Therapeutic exercise;Patient/family education    PT Goals (Current goals can be found in the Care Plan section)  Acute Rehab PT Goals Patient Stated Goal: pt wants to go home PT Goal Formulation: With patient/family Time For Goal Achievement: 10/24/16 Potential to Achieve Goals: Fair    Frequency Min 2X/week   Barriers to discharge   son is in hospital and will have his own recovery (he is one of the main caregivers).    Co-evaluation               End of Session Equipment Utilized During Treatment: Oxygen Activity Tolerance: Patient limited by fatigue Patient left: in chair;with call bell/phone within reach;with chair alarm set;with family/visitor present Nurse Communication: Mobility status PT Visit Diagnosis: Muscle weakness (generalized) (M62.81)         Time: 1430-1500 PT Time Calculation (min) (ACUTE ONLY): 30 min   Charges:   PT Evaluation $PT Eval Moderate Complexity: 1 Procedure PT Treatments $Therapeutic Activity: 8-22 mins   PT G Codes:         Loyal Buba 10/10/2016, 3:11 PM 10/10/2016   Rande Lawman, PT

## 2016-10-10 NOTE — Progress Notes (Signed)
PROGRESS NOTE    [name]  [id number] DOB: 1927/02/07 DOA: 10/03/2016 PCP: Leonides Sake, MD    Brief Narrative: Patient is a 83 year old female history of CHF, A. fib on chronic anticoagulation, COPD who presented with acute hypoxic respiratory failure likely secondary to acute CHF exacerbation. Patient also noted to be in acute renal failure. Because of also possible pneumonia. Patient seen by critical care and was being managed by critical care. Patient with some clinical improvement and transferred to the floor. Patient noted to have a new cardiomyopathy with a EF of approximately 30%. Cardiology consulted. Patient also with some dysphagia. Palliative care consultation for goals of care.   Assessment & Plan:   Principal Problem:   Acute respiratory failure with hypoxia (HCC) Active Problems:   Acute systolic heart failure (HCC)   Cardiomyopathy ([place])   Atrial fibrillation (HCC)   Hypothyroidism   CAP (community acquired pneumonia)   AKI (acute kidney injury) ([place])   Hypokalemia   Normocytic anemia   Chronic diastolic CHF (congestive heart failure) (HCC)   Hypotension   COPD with acute exacerbation (HCC)   Sepsis due to pneumonia ([place])   Acute encephalopathy   Goals of care, counseling/discussion   Palliative care encounter   Encounter for hospice care discussion   Dysphagia  #1 acute respiratory failure with hypoxia secondary to acute systolic heart failure and pleural effusions in the setting of restrictive lung disease. Patient initially admitted to critical care and initially being treated for probable pneumonia and COPD exacerbation. Chest x-ray consistent with volume overload. Patient with elevated BNP. 2-D echo which was done with a new cardiomyopathy with a EF of 30-35% with wall motion abnormalities. Patient with multiple comorbidities. Patient asking for aggressive treatment. Patient on IV Lasix 40 mg twice a day with a urine output of 0.850 L over the past  24 hours. Patient also with issues of dysphagia with esophageal deficits. Continue diuresis with IV Lasix. Spironolactone and lisinopril have been added to patient's regimen per cardiology. Cardiology following and appreciate input and recommendations.   #2 hypokalemia Likely secondary to diuresis. Improved.  #3 atrial fibrillation CHA2DS2-VASC 5 Currently rate controlled.. Beta blocker has been on hold in order to allow fluid removal by critical care. Per cardiology may resume beta blocker on discharge it pressure tolerates. Patient currently on IV heparin. Will resume Coumadin per pharmacy.  #4 acute renal failure Likely secondary to acute systolic heart failure. Improving with diuresis. Follow.  #5 protein calorie malnutrition Patient has been assessed by speech therapy. Patient with some significant esophageal deficits and at increased risk for aspiration. Patientplaced on full liquid diet. Monitor.  #6 dysphagia Patient with some esophageal deficits. Patient with increased risk of aspiration. Patient currently on a full liquid diet. Speech therapy following. Follow.  #7 acute encephalopathy Improved. Sedating medications on hold.  #8 hypothyroidism Continue IV Synthroid.  #9 prognosis Patient with a poor prognosis. Patient presented with acute respiratory failure initially felt to be likely secondary to pneumonia and possible COPD exacerbation. Patient noted to be in new acute systolic heart failure with a new cardiomyopathy with a EF of approximately 30%. Due to patient's comorbidities likely a poor candidate for any invasive testing. Patient was aggressive treatment at this time. Cardiology has been consulted. Patient also noted to have a dysphagia. Palliative care consultation for goals of care.   DVT prophylaxis: Heparin Code Status: DNR Family Communication: updated patient. No family at bedside. Disposition Plan: Pending PT evaluation and palliative care evaluation.  Probable  skilled nursing facility.   Consultants:   Cardiology pending 10/09/2016  Palliative care pending 10/09/2016  Procedures:  PFT 09/12/13: FVC 1.37 L (74%) FEV1 0.98 L (82%) FEV1/FVC 0.71 FEF 25-75 0.71 L (57%) negative bronchodilator response TLC 3.57 L (68%) RV 86% ERV 66%  PORT CXR 2/19:  Personally reviewed by me. Hazy bilateral basilar opacities. Mild alveolar opacities within the mid and upper lung zones as well. PCXR: 2/22 worsening bilateral aeration. Likely element of both edema and effusion but rotated film makes difficult to eval   2-D echo 10/07/2016--- EF 99991111, systolic function moderately to severely reduced, severely dilated left atrium. Mildly reduced right ventricular systolic function. Basal to mid anterior septal akinesis. Basal to mid inferior septal akinesis. Basal to mid inferior akinesis.  LINES/TUBES: Foley 2/18 >>> RUE DL PICC 2/18 >>> PIV    Antimicrobials:  Rocephin 2/17 - 2/19 Zosyn 2/19 - 2/20 Azithromycin 2/17 >>> (stop date 2/21) Cefepime 2/20 >>>2/21  Blood Cultures x2 2/17 >>> MRSA PCR 2/18:  Negative  Urine Culture 2/18:  Negative  Urine Streptococcal Antigen 2/18:  Negative  Urine Legionella Antigen 2/20 >>>   Subjective: Patient states shortness of breath improving. Patient denies any chest pain.  Objective: Vitals:   10/10/16 0053 10/10/16 0536 10/10/16 0747 10/10/16 1122  BP:  126/68  (!) 142/69  Pulse:  86  73  Resp:  18    Temp:  97.5 F (36.4 C)    TempSrc:  Oral    SpO2:  98% 97%   Weight: 61.9 kg (136 lb 6.4 oz)     Height:        Intake/Output Summary (Last 24 hours) at 10/10/16 1134 Last data filed at 10/10/16 0915  Gross per 24 hour  Intake           443.41 ml  Output              850 ml  Net          -406.59 ml   Filed Weights   10/08/16 1703 10/09/16 0432 10/10/16 0053  Weight: 71.6 kg (157 lb 12.8 oz) 66.1 kg (145 lb 11.2 oz) 61.9 kg (136 lb 6.4 oz)    Examination:  General exam: Appears calm and  comfortable  Respiratory system: Decreased breath sounds in the bases.Some right-sided bibasilar crackles.  Cardiovascular system: Irregularly irregular. No JVD, murmurs, rubs, gallops or clicks. No pedal edema. Gastrointestinal system: Abdomen is nondistended, soft and nontender. No organomegaly or masses felt. Normal bowel sounds heard. Central nervous system: Alert and oriented. No focal neurological deficits. Extremities: Symmetric 5 x 5 power. Skin: No rashes, lesions or ulcers Psychiatry: Judgement and insight appear fair. Mood & affect appropriate.     Data Reviewed: I have personally reviewed following labs and imaging studies  CBC:  Recent Labs Lab 10/03/16 1953 10/04/16 0548  10/06/16 0609 10/06/16 1320 10/08/16 0330 10/09/16 0431 10/10/16 0417  WBC  --  8.4  < > 9.5 10.9* 7.4 7.9 7.0  NEUTROABS 6.3 7.7  --   --  9.7*  --   --   --   HGB  --  9.2*  < > 9.4* 9.5* 8.5* 8.9* 9.7*  HCT  --  28.7*  < > 28.9* 28.4* 25.4* 27.4* 29.2*  MCV  --  95.0  < > 94.4 94.7 94.1 95.8 95.1  PLT  --  162  < > 200 206 155 149* 155  < > = values in this interval  not displayed. Basic Metabolic Panel:  Recent Labs Lab 10/06/16 0609 10/06/16 0900 10/06/16 1325 10/07/16 0427 10/08/16 0330 10/09/16 0431 10/09/16 0432 10/10/16 0417  NA 141  --   --  139 140  --  140 139  K 4.0  --   --  3.3* 3.1*  --  4.3 4.0  CL 109  --   --  101 102  --  103 99*  CO2 21*  --   --  26 29  --  30 31  GLUCOSE 133*  --   --  133* 81  --  84 87  BUN 19  --   --  21* 18  --  15 15  CREATININE 1.30*  --   --  1.54* 1.35*  --  1.12* 0.98  CALCIUM 8.3*  --   --  8.8* 8.4*  --  8.5* 9.1  MG  --   --  2.0 2.0 1.9 1.8  --  1.9  PHOS  --  2.6  --  2.6 2.2*  --  2.9 2.8   GFR: Estimated Creatinine Clearance: 39.3 mL/min (by C-G formula based on SCr of 0.98 mg/dL). Liver Function Tests:  Recent Labs Lab 10/05/16 0610 10/07/16 0427 10/08/16 0330 10/09/16 0432 10/10/16 0417  AST 60*  --   --   --   --    ALT 38  --   --   --   --   ALKPHOS 66  --   --   --   --   BILITOT 0.9  --   --   --   --   PROT 5.1*  --   --   --   --   ALBUMIN 2.6* 2.6* 2.2* 2.3* 2.5*   No results for input(s): LIPASE, AMYLASE in the last 168 hours. No results for input(s): AMMONIA in the last 168 hours. Coagulation Profile:  Recent Labs Lab 10/06/16 0609 10/07/16 0427 10/08/16 0330 10/09/16 0431 10/10/16 0417  INR 6.84* 1.59 1.44 1.19 1.22   Cardiac Enzymes: No results for input(s): CKTOTAL, CKMB, CKMBINDEX, TROPONINI in the last 168 hours. BNP (last 3 results) No results for input(s): PROBNP in the last 8760 hours. HbA1C: No results for input(s): HGBA1C in the last 72 hours. CBG: No results for input(s): GLUCAP in the last 168 hours. Lipid Profile: No results for input(s): CHOL, HDL, LDLCALC, TRIG, CHOLHDL, LDLDIRECT in the last 72 hours. Thyroid Function Tests: No results for input(s): TSH, T4TOTAL, FREET4, T3FREE, THYROIDAB in the last 72 hours. Anemia Panel: No results for input(s): VITAMINB12, FOLATE, FERRITIN, TIBC, IRON, RETICCTPCT in the last 72 hours. Sepsis Labs:  Recent Labs Lab 10/04/16 0019 10/04/16 0548 10/06/16 1320 10/07/16 0427 10/08/16 0330  PROCALCITON  --   --  0.11 0.18 0.17  LATICACIDVEN 0.8 1.1  --   --   --     Recent Results (from the past 240 hour(s))  Culture, blood (routine x 2) Call MD if unable to obtain prior to antibiotics being given     Status: None   Collection Time: 10/03/16  7:50 PM  Result Value Ref Range Status   Specimen Description BLOOD LEFT ANTECUBITAL  Final   Special Requests BOTTLES DRAWN AEROBIC AND ANAEROBIC 5CC EA  Final   Culture NO GROWTH 5 DAYS  Final   Report Status 10/09/2016 FINAL  Final  Culture, blood (routine x 2) Call MD if unable to obtain prior to antibiotics being given     Status:  None   Collection Time: 10/03/16 10:00 PM  Result Value Ref Range Status   Specimen Description BLOOD RIGHT WRIST  Final   Special Requests  BOTTLES DRAWN AEROBIC AND ANAEROBIC 5CC EA  Final   Culture NO GROWTH 5 DAYS  Final   Report Status 10/09/2016 FINAL  Final  Culture, Urine     Status: None   Collection Time: 10/04/16  1:23 AM  Result Value Ref Range Status   Specimen Description URINE, CATHETERIZED  Final   Special Requests NONE  Final   Culture NO GROWTH  Final   Report Status 10/05/2016 FINAL  Final  MRSA PCR Screening     Status: None   Collection Time: 10/04/16  1:53 AM  Result Value Ref Range Status   MRSA by PCR NEGATIVE NEGATIVE Final    Comment:        The GeneXpert MRSA Assay (FDA approved for NASAL specimens only), is one component of a comprehensive MRSA colonization surveillance program. It is not intended to diagnose MRSA infection nor to guide or monitor treatment for MRSA infections.          Radiology Studies: No results found.      Scheduled Meds: . budesonide  0.5 mg Nebulization BID  . dorzolamide  1 drop Left Eye BID  . furosemide  40 mg Intravenous BID  . ipratropium  0.5 mg Nebulization Q6H  . levalbuterol  0.63 mg Nebulization Q6H  . levothyroxine  37.5 mcg Intravenous Daily  . lisinopril  5 mg Oral Daily  . mouth rinse  15 mL Mouth Rinse BID  . pilocarpine  1 drop Left Eye BID  . sodium chloride flush  10-40 mL Intracatheter Q12H  . spironolactone  25 mg Oral Daily  . timolol  1 drop Left Eye BID  . Travoprost (BAK Free)  1 drop Left Eye QHS   Continuous Infusions: . heparin 1,050 Units/hr (10/10/16 0539)     LOS: 7 days    Time spent: 98 mins    THOMPSON,DANIEL, MD [facility] Pager [phone]  If 7PM-7AM, please contact night-coverage [email or web address] Password [facility] 10/10/2016, 11:34 AM

## 2016-10-10 NOTE — Progress Notes (Addendum)
Patient Name: [name] Date of Encounter: 10/10/2016  Primary Cardiologist: Dr. Matilde Haymaker Problem List     Principal Problem:   Acute respiratory failure with hypoxia [facility]) Active Problems:   Atrial fibrillation ([place])   Hypothyroidism   CAP (community acquired pneumonia)   AKI (acute kidney injury) ([place])   Hypokalemia   Normocytic anemia   Chronic diastolic CHF (congestive heart failure) (HCC)   Hypotension   COPD with acute exacerbation (HCC)   Sepsis due to pneumonia ([place])   Acute encephalopathy   Acute systolic heart failure ([place])   Cardiomyopathy ([place])   Goals of care, counseling/discussion   Palliative care encounter   Encounter for hospice care discussion   Dysphagia     Subjective   [name] is a 83 y.o. female with a history of chronic atrial fibrillation on coumadin, restrictive lung disease, chronic diastolic CHF, HTN, hypothyroidism and depression who presented to [facility] on 10/04/16 with cough and weakness. Found to have acute hypoxic respiratory failure 2/2 acute CHF. 2D ECHO this admission shows new systolic dysfunction and cardiology consulted.    Inpatient Medications    Scheduled Meds: . budesonide  0.5 mg Nebulization BID  . dorzolamide  1 drop Left Eye BID  . furosemide  40 mg Intravenous BID  . ipratropium  0.5 mg Nebulization Q6H  . levalbuterol  0.63 mg Nebulization Q6H  . levothyroxine  37.5 mcg Intravenous Daily  . mouth rinse  15 mL Mouth Rinse BID  . pilocarpine  1 drop Left Eye BID  . sodium chloride flush  10-40 mL Intracatheter Q12H  . spironolactone  25 mg Oral Daily  . timolol  1 drop Left Eye BID  . Travoprost (BAK Free)  1 drop Left Eye QHS   Continuous Infusions: . heparin 1,050 Units/hr (10/10/16 0539)   PRN Meds: acetaminophen, levalbuterol, sodium chloride flush   Vital Signs    Vitals:   10/10/16 0027 10/10/16 0053 10/10/16 0536 10/10/16 0747  BP:   126/68   Pulse:   86   Resp:   18   Temp:    97.5 F (36.4 C)   TempSrc:   Oral   SpO2: 99%  98% 97%  Weight:  136 lb 6.4 oz (61.9 kg)    Height:        Intake/Output Summary (Last 24 hours) at 10/10/16 1046 Last data filed at 10/10/16 0915  Gross per 24 hour  Intake           443.41 ml  Output              850 ml  Net          -406.59 ml   Filed Weights   10/08/16 1703 10/09/16 0432 10/10/16 0053  Weight: 157 lb 12.8 oz (71.6 kg) 145 lb 11.2 oz (66.1 kg) 136 lb 6.4 oz (61.9 kg)    Physical Exam    GEN: Well nourished, well developed, in no acute distress.  HEENT: Grossly normal.  Neck: Supple, no JVD, carotid bruits, or masses. Cardiac: RRR, no murmurs, rubs, or gallops. No clubbing, cyanosis, edema.  Radials/DP/PT 2+ and equal bilaterally.  Respiratory:  Respirations regular and unlabored, clear to auscultation bilaterally. GI: Soft, nontender, nondistended, BS + x 4. MS: no deformity or atrophy. Skin: warm and dry, no rash. Neuro:  Strength and sensation are intact. Psych: AAOx3.  Normal affect.  Labs    CBC  Recent Labs  10/09/16 0431 10/10/16 0417  WBC 7.9 7.0  HGB 8.9* 9.7*  HCT 27.4* 29.2*  MCV 95.8 95.1  PLT 149* 99991111   Basic Metabolic Panel  Recent Labs  10/09/16 0431 10/09/16 0432 10/10/16 0417  NA  --  140 139  K  --  4.3 4.0  CL  --  103 99*  CO2  --  30 31  GLUCOSE  --  84 87  BUN  --  15 15  CREATININE  --  1.12* 0.98  CALCIUM  --  8.5* 9.1  MG 1.8  --  1.9  PHOS  --  2.9 2.8   Liver Function Tests  Recent Labs  10/09/16 0432 10/10/16 0417  ALBUMIN 2.3* 2.5*   No results for input(s): LIPASE, AMYLASE in the last 72 hours. Cardiac Enzymes No results for input(s): CKTOTAL, CKMB, CKMBINDEX, TROPONINI in the last 72 hours. BNP Invalid input(s): POCBNP D-Dimer No results for input(s): DDIMER in the last 72 hours. Hemoglobin A1C No results for input(s): HGBA1C in the last 72 hours. Fasting Lipid Panel No results for input(s): CHOL, HDL, LDLCALC, TRIG, CHOLHDL, LDLDIRECT  in the last 72 hours. Thyroid Function Tests No results for input(s): TSH, T4TOTAL, T3FREE, THYROIDAB in the last 72 hours.  Invalid input(s): FREET3  Telemetry    NSR - Personally Reviewed  ECG    NSR - Personally Reviewed  Radiology    No results found.  Cardiac Studies   Impressions:  - The patient was in atrial fibrillation. EF 30-35% with somewhat   unusual wall motion abnormality pattern => basal to mid   anteroseptal, basal to mid inferoseptal, and basal to mid   inferior akinesis. Mildly dilated RV with mildly decreased   systolic function. Moderate aortic insufficiency. Mild mitral   regurgitation. Severe biatrial enlargement. Mild pulmonary   hypertension.  Patient Profile   [name] is a 83 y.o. female with a history of chronic atrial fibrillation on coumadin, restrictive lung disease, chronic diastolic CHF, HTN, hypothyroidism and depression who presented to [facility] on 10/04/16 with cough and weakness. Found to have acute hypoxic respiratory failure 2/2 acute CHF. 2D ECHO this admission shows new systolic dysfunction and cardiology consulted.    Assessment & Plan    New systolic CHF: EF 99991111 . No known CAD but there was aortic atherosclerosis noted on previous imaging. Not a candidate for further ischemic work up. Currently on IV lasix 40mg  BID. Net positive 698cc. Creat normalized. -- Home lopressor 25mg  BID, spiro 25mg  daily and lisinopril 5mg  daily were held on admission given hypotension. Will restart Lisinopril 5mg  daily back today.  Added back spirolactone to augment diuresis and treat hypokalemia yesterday. Restart BB at discharge if Bp tolerates.   Chronic afib: HRs have been relatively well controlled. INR was supratheraputic on admission and this was reversed. Now INR 1.22. Continue coumadin for CHADS2VASC score 5 (CHF, HTN, age, vasc diz). Pharmacy managing.   HTN: BPs were soft on admission but now improved. Spironolactone as restarted yesterday.  Will restart Lisinopril 5mg  daily.  Hypothyroidism: TSH normal   Dispo: agree with diuresis and palliative care consult   Call with any questions over weekend.  Will see back on Monday.  Signed, Fransico Him, MD  10/10/2016, 10:46 AM

## 2016-10-10 NOTE — Progress Notes (Signed)
[place] for heparin and coumadin  Indication: atrial fibrillation  No Known Allergies  Patient Measurements: Height: 5\' 7"  (170.2 cm) Weight: 136 lb 6.4 oz (61.9 kg) IBW/kg (Calculated) : 61.6  Vital Signs: Temp: 97.3 F (36.3 C) (02/24 1325) Temp Source: Oral (02/24 1325) BP: 125/84 (02/24 1325) Pulse Rate: 89 (02/24 1325)  Labs:  Recent Labs  10/08/16 0330  10/09/16 0431 10/09/16 0432 10/09/16 0955 10/10/16 0417 10/10/16 1846  HGB 8.5*  --  8.9*  --   --  9.7*  --   HCT 25.4*  --  27.4*  --   --  29.2*  --   PLT 155  --  149*  --   --  155  --   LABPROT 17.7*  --  15.2  --   --  15.5*  --   INR 1.44  --  1.19  --   --  1.22  --   HEPARINUNFRC  --   < >  --   --  0.26* 0.23* 0.35  CREATININE 1.35*  --   --  1.12*  --  0.98  --   < > = values in this interval not displayed.  Estimated Creatinine Clearance: 39.3 mL/min (by C-G formula based on SCr of 0.98 mg/dL).  Assessment: 83 yo female with acute hypoxic respiratory failure on coumadiin PTA for afib and currently on hold (not able to tolerate po meds). Pt on heparin gtt and coumadin resumed 2/24.  Heparin level is therapeutic this evening at 0.35 after most recent dose adjustment to 1050 units/hr.  INR 1.22 a d to resume coumadin this evening.   Goal of Therapy:  INR 2-3 Heparin level 0.3-0.7 units/ml Monitor platelets by anticoagulation protocol: Yes   Plan:  -Continue heparin infusion at 1050 units/hr  -Coumadin 1 mg x 1 this evening -Daily heparin level, INR and CBC  Vincenza Hews, PharmD, BCPS 10/10/2016, 8:06 PM

## 2016-10-11 LAB — CBC
HEMATOCRIT: 30.4 % — AB (ref 36.0–46.0)
Hemoglobin: 10.1 g/dL — ABNORMAL LOW (ref 12.0–15.0)
MCH: 31.2 pg (ref 26.0–34.0)
MCHC: 33.2 g/dL (ref 30.0–36.0)
MCV: 93.8 fL (ref 78.0–100.0)
Platelets: 157 10*3/uL (ref 150–400)
RBC: 3.24 MIL/uL — ABNORMAL LOW (ref 3.87–5.11)
RDW: 14.8 % (ref 11.5–15.5)
WBC: 7.4 10*3/uL (ref 4.0–10.5)

## 2016-10-11 LAB — PROTIME-INR
INR: 1.19
PROTHROMBIN TIME: 15.1 s (ref 11.4–15.2)

## 2016-10-11 LAB — RENAL FUNCTION PANEL
Albumin: 2.5 g/dL — ABNORMAL LOW (ref 3.5–5.0)
Anion gap: 10 (ref 5–15)
BUN: 13 mg/dL (ref 6–20)
CHLORIDE: 95 mmol/L — AB (ref 101–111)
CO2: 31 mmol/L (ref 22–32)
CREATININE: 1.02 mg/dL — AB (ref 0.44–1.00)
Calcium: 9 mg/dL (ref 8.9–10.3)
GFR calc Af Amer: 56 mL/min — ABNORMAL LOW (ref >60)
GFR, EST NON AFRICAN AMERICAN: 48 mL/min — AB (ref >60)
GLUCOSE: 80 mg/dL (ref 65–99)
POTASSIUM: 3.6 mmol/L (ref 3.5–5.1)
Phosphorus: 3.3 mg/dL (ref 2.5–4.6)
Sodium: 136 mmol/L (ref 135–145)

## 2016-10-11 LAB — MAGNESIUM: MAGNESIUM: 1.9 mg/dL (ref 1.7–2.4)

## 2016-10-11 LAB — HEPARIN LEVEL (UNFRACTIONATED): Heparin Unfractionated: 0.52 IU/mL (ref 0.30–0.70)

## 2016-10-11 MED ORDER — OXYCODONE-ACETAMINOPHEN 5-325 MG PO TABS
1.0000 | ORAL_TABLET | Freq: Three times a day (TID) | ORAL | Status: DC | PRN
  Administered 2016-10-11 – 2016-10-12 (×3): 1 via ORAL
  Filled 2016-10-11 (×3): qty 1

## 2016-10-11 MED ORDER — WARFARIN SODIUM 2 MG PO TABS
2.0000 mg | ORAL_TABLET | Freq: Once | ORAL | Status: AC
  Administered 2016-10-11: 2 mg via ORAL
  Filled 2016-10-11 (×2): qty 1

## 2016-10-11 MED ORDER — ALTEPLASE 2 MG IJ SOLR
2.0000 mg | Freq: Once | INTRAMUSCULAR | Status: AC
  Administered 2016-10-11: 2 mg
  Filled 2016-10-11: qty 2

## 2016-10-11 MED ORDER — GABAPENTIN 250 MG/5ML PO SOLN
600.0000 mg | Freq: Every day | ORAL | Status: DC
  Administered 2016-10-11 – 2016-10-14 (×4): 600 mg via ORAL
  Filled 2016-10-11 (×6): qty 12

## 2016-10-11 MED ORDER — METHOCARBAMOL 500 MG PO TABS
500.0000 mg | ORAL_TABLET | Freq: Two times a day (BID) | ORAL | Status: DC
  Administered 2016-10-11 – 2016-10-15 (×9): 500 mg via ORAL
  Filled 2016-10-11 (×9): qty 1

## 2016-10-11 NOTE — Progress Notes (Signed)
SATURATION QUALIFICATIONS: (This note is used to comply with regulatory documentation for home oxygen)  Patient Saturations on Room Air at Rest = 97%  Patient Saturations on Room Air while getting up to chair = 99%

## 2016-10-11 NOTE — Evaluation (Signed)
Occupational Therapy Evaluation Patient Details Name: [name] MRN: [id number] DOB: [birth year] Today's Date: 10/11/2016    History of Present Illness Pt is an 83 y/o female who presents with SOB and intermittent periods of confusion. She was admitted with sepsis 2 CAP and COPD exacerbation. Pts daughter present for this eval.  pt was doing her transfers on her own - all scoot transfers - to toilet and chair and shower etc.  they have had a lot of homehealth in the past and have good set up for pt. she waters her plants and bakes cookies - etc - all from wheelchair level.   Clinical Impression   Pt required min assist from family with BADL PTA. Currently pt overall mod assist for squat pivot transfer and mod-max assist for ADL. Pt appears anxious throughout mobility; reports she feels like she cannot breathe. SpO2 >95% on RA throughout session. Pt presenting with generalized weakness, poor sitting balance, pain, and decreased activity tolerance impacting her independence and safety with ADL and functional mobility. Recommending SNF for follow up to maximize independence and safety with ADL and functional mobility prior to return home. Pt would benefit from continued skilled OT to address established goals.    Follow Up Recommendations  SNF;Supervision/Assistance - 24 hour    Equipment Recommendations  None recommended by OT    Recommendations for Other Services       Precautions / Restrictions Precautions Precautions: Fall Restrictions Weight Bearing Restrictions: No      Mobility Bed Mobility Overal bed mobility: Needs Assistance Bed Mobility: Supine to Sit     Supine to sit: Mod assist     General bed mobility comments: Assist for LEs and for trunk elevation to sitting. Assist to scoot hips out to EOB.   Transfers Overall transfer level: Needs assistance Equipment used: 1 person hand held assist Transfers: Squat Pivot Transfers     Squat pivot transfers: Mod  assist     General transfer comment: x2 mini squat pivots performed. Pt assisting with pushing throughout bil UEs to boost over. Appears to be very anxious throughout transfer.    Balance Overall balance assessment: Needs assistance Sitting-balance support: Feet supported;Bilateral upper extremity supported Sitting balance-Leahy Scale: Poor                                      ADL Overall ADL's : Needs assistance/impaired   Eating/Feeding Details (indicate cue type and reason): Daughter assisting wtih feeding pt upon arrival Grooming: Minimal assistance;Sitting   Upper Body Bathing: Moderate assistance;Sitting   Lower Body Bathing: Maximal assistance;Sitting/lateral leans   Upper Body Dressing : Minimal assistance;Sitting   Lower Body Dressing: Maximal assistance;Sitting/lateral leans   Toilet Transfer: Moderate assistance;Squat-pivot;BSC Toilet Transfer Details (indicate cue type and reason): Simulated by squat pivot from EOB to chair with drop arm.         Functional mobility during ADLs: Moderate assistance (for squat pivot only) General ADL Comments: Discussed post acute rehab with pts daughter; she is agreeable. SpO2 >95% on RA throughout session.     Vision         Perception     Praxis      Pertinent Vitals/Pain Pain Assessment: No/denies pain     Hand Dominance     Extremity/Trunk Assessment Upper Extremity Assessment Upper Extremity Assessment: Generalized weakness   Lower Extremity Assessment Lower Extremity Assessment: Defer to PT evaluation  Cervical / Trunk Assessment Cervical / Trunk Assessment: Kyphotic   Communication Communication Communication: No difficulties   Cognition Arousal/Alertness: Awake/alert Behavior During Therapy: Anxious Overall Cognitive Status: History of cognitive impairments - at baseline                     General Comments       Exercises       Shoulder Instructions      Home  Living Family/patient expects to be discharged to:: Private residence Living Arrangements: Children Available Help at Discharge: Family;Available 24 hours/day Type of Home: House Home Access: Ramped entrance     Home Layout: One level     Bathroom Shower/Tub: Occupational psychologist: Standard     Home Equipment: Youth worker - 2 wheels;Shower seat - built in;Bedside commode   Additional Comments: Pts son is one of her primary caregivers; he was recently hospitalized for back sx      Prior Functioning/Environment Level of Independence: Needs assistance  Gait / Transfers Assistance Needed: Transfers only with assistance ADL's / Homemaking Assistance Needed: Minimal assist with bathing and dressing            OT Problem List: Decreased strength;Decreased activity tolerance;Impaired balance (sitting and/or standing);Decreased knowledge of use of DME or AE;Cardiopulmonary status limiting activity;Pain      OT Treatment/Interventions:      OT Goals(Current goals can be found in the care plan section) Acute Rehab OT Goals Patient Stated Goal: return home OT Goal Formulation: With patient/family Time For Goal Achievement: 10/25/16 Potential to Achieve Goals: Good ADL Goals Pt Will Perform Grooming: with supervision;sitting Pt Will Perform Upper Body Bathing: with supervision;sitting Pt Will Perform Lower Body Bathing: with supervision;sit to/from stand Pt Will Transfer to Toilet: with supervision;squat pivot transfer;bedside commode Pt Will Perform Toileting - Clothing Manipulation and hygiene: with supervision;sitting/lateral leans  OT Frequency: Min 2X/week   Barriers to D/C:            Co-evaluation              End of Session Nurse Communication: Mobility status;Other (comment) (SpO2 >95% on RA throughout)  Activity Tolerance: Patient tolerated treatment well Patient left: in chair;with call bell/phone within reach;with chair alarm  set;with family/visitor present  OT Visit Diagnosis: Muscle weakness (generalized) (M62.81)                ADL either performed or assessed with clinical judgement  Time: [id number] OT Time Calculation (min): 19 min Charges:  OT General Charges $OT Visit: 1 Procedure OT Evaluation $OT Eval Moderate Complexity: 1 Procedure G-Codes:     [name], M.S., OTR/L Pager: [place] 10/11/2016, 1:36 PM

## 2016-10-11 NOTE — Progress Notes (Signed)
[place] for heparin and coumadin  Indication: atrial fibrillation  No Known Allergies  Patient Measurements: Height: 5\' 7"  (170.2 cm) Weight: 136 lb 14.4 oz (62.1 kg) IBW/kg (Calculated) : 61.6  Vital Signs: Temp: 98 F (36.7 C) (02/25 0538) Temp Source: Oral (02/25 0538) BP: 126/77 (02/25 0538) Pulse Rate: 68 (02/25 0538)  Labs:  Recent Labs  10/09/16 0431 10/09/16 0432  10/10/16 0417 10/10/16 1846 10/11/16 0325 10/11/16 1125  HGB 8.9*  --   --  9.7*  --  10.1*  --   HCT 27.4*  --   --  29.2*  --  30.4*  --   PLT 149*  --   --  155  --  157  --   LABPROT 15.2  --   --  15.5*  --  15.1  --   INR 1.19  --   --  1.22  --  1.19  --   HEPARINUNFRC  --   --   < > 0.23* 0.35  --  0.52  CREATININE  --  1.12*  --  0.98  --  1.02*  --   < > = values in this interval not displayed.  Estimated Creatinine Clearance: 37.8 mL/min (by C-G formula based on SCr of 1.02 mg/dL (H)).  Assessment: 83 yo female admitted with acute hypoxic respiratory failure on coumadiin PTA for afib. Transitioned to heparin gtt on admission and coumadin held due to inability to tolerate PO meds. Coumadin resumed on 2/24 and remains on hep gtt.   Heparin level remains therapeutic at 0.52, INR 1.19 and CBC stable.   Goal of Therapy:  INR 2-3 Heparin level 0.3-0.7 units/ml Monitor platelets by anticoagulation protocol: Yes   Plan:  -Continue heparin infusion at 1050 units/hr  -Coumadin 2 mg x 1 this evening -Daily heparin level, INR and CBC  Vincenza Hews, PharmD, BCPS 10/11/2016, 12:27 PM

## 2016-10-11 NOTE — Progress Notes (Signed)
PROGRESS NOTE    [name]  [id number] DOB: July 31, 1927 DOA: 10/03/2016 PCP: Leonides Sake, MD    Brief Narrative: Patient is a 83 year old female history of CHF, A. fib on chronic anticoagulation, COPD who presented with acute hypoxic respiratory failure likely secondary to acute CHF exacerbation. Patient also noted to be in acute renal failure. Because of also possible pneumonia. Patient seen by critical care and was being managed by critical care. Patient with some clinical improvement and transferred to the floor. Patient noted to have a new cardiomyopathy with a EF of approximately 30%. Cardiology consulted. Patient also with some dysphagia. Palliative care consultation for goals of care.   Assessment & Plan:   Principal Problem:   Acute respiratory failure with hypoxia (HCC) Active Problems:   Acute systolic heart failure (HCC)   Cardiomyopathy ([place])   Atrial fibrillation (HCC)   Hypothyroidism   CAP (community acquired pneumonia)   AKI (acute kidney injury) ([place])   Hypokalemia   Normocytic anemia   Chronic diastolic CHF (congestive heart failure) (HCC)   Hypotension   COPD with acute exacerbation (HCC)   Sepsis due to pneumonia ([place])   Acute encephalopathy   Goals of care, counseling/discussion   Palliative care encounter   Encounter for hospice care discussion   Dysphagia   Respiratory distress  #1 acute respiratory failure with hypoxia secondary to acute systolic heart failure and pleural effusions in the setting of restrictive lung disease. Patient initially admitted to critical care and initially being treated for probable pneumonia and COPD exacerbation. Chest x-ray consistent with volume overload. Patient with elevated BNP. 2-D echo which was done with a new cardiomyopathy with a EF of 30-35% with wall motion abnormalities. Patient with multiple comorbidities. Patient asking for aggressive treatment. Patient on IV Lasix 40 mg twice a day with a urine output  of 1.750 L over the past 24 hours. Patient also with issues of dysphagia with esophageal deficits. Continue diuresis with IV Lasix. Spironolactone and lisinopril have been added to patient's regimen per cardiology. Cardiology following and appreciate input and recommendations.   #2 hypokalemia Likely secondary to diuresis. Improved.  #3 atrial fibrillation CHA2DS2-VASC 5 Currently rate controlled.. Beta blocker has been on hold in order to allow fluid removal by critical care. Per cardiology may resume beta blocker on discharge it pressure tolerates. Patient currently on IV heparin/ Coumadin per pharmacy.  #4 acute renal failure Likely secondary to acute systolic heart failure. Improved with diuresis. Follow.  #5 protein calorie malnutrition Patient has been assessed by speech therapy. Patient with some significant esophageal deficits and at increased risk for aspiration. Patient placed on full liquid diet. Monitor.  #6 dysphagia Patient with some esophageal deficits. Patient with increased risk of aspiration. Patient currently on a full liquid diet. Speech therapy following. Follow.  #7 acute encephalopathy Improved. Sedating medications on hold.  #8 hypothyroidism Continue IV Synthroid.  #9 neuropathy Resume home regimen gabapentin.  #10 chronic pain Resume home regimen of Robaxin and Percocet as needed.  #11 prognosis Patient with a poor prognosis. Patient presented with acute respiratory failure initially felt to be likely secondary to pneumonia and possible COPD exacerbation. Patient noted to be in new acute systolic heart failure with a new cardiomyopathy with a EF of approximately 30%. Due to patient's comorbidities likely a poor candidate for any invasive testing. Patient was aggressive treatment at this time. Cardiology has been consulted. Patient also noted to have a dysphagia. Palliative care has assessed the patient and currently family  decide how best to feed the patient  has patient does have some dysphagia/esophageal dysmotility issues. Family determining on SNF versus home with home health. Patient's daughter to discuss with her brother on several issues before making final decision. Patient's son recently hospitalized for a vertebral abscess per daughter. Palliative care following.   DVT prophylaxis: Heparin/Coumadin Code Status: DNR Family Communication: updated patient and daughter at bedside. Disposition Plan: Pending PT evaluation and palliative care evaluation. Probable skilled nursing facility versus home with home health   Consultants:   Cardiology Dr. Dorris Carnes 10/09/2016  Palliative care Dr. Rowe Pavy 10/09/2016  Procedures:  PFT 09/12/13: FVC 1.37 L (74%) FEV1 0.98 L (82%) FEV1/FVC 0.71 FEF 25-75 0.71 L (57%) negative bronchodilator response TLC 3.57 L (68%) RV 86% ERV 66%  PORT CXR 2/19:  Personally reviewed by me. Hazy bilateral basilar opacities. Mild alveolar opacities within the mid and upper lung zones as well. PCXR: 2/22 worsening bilateral aeration. Likely element of both edema and effusion but rotated film makes difficult to eval   2-D echo 10/07/2016--- EF [id number], systolic function moderately to severely reduced, severely dilated left atrium. Mildly reduced right ventricular systolic function. Basal to mid anterior septal akinesis. Basal to mid inferior septal akinesis. Basal to mid inferior akinesis.  LINES/TUBES: Foley 2/18 >>> RUE DL PICC 2/18 >>> PIV    Antimicrobials:  Rocephin 2/17 - 2/19 Zosyn 2/19 - 2/20 Azithromycin 2/17 >>> (stop date 2/21) Cefepime 2/20 >>>2/21  Blood Cultures x2 2/17 >>> MRSA PCR 2/18:  Negative  Urine Culture 2/18:  Negative  Urine Streptococcal Antigen 2/18:  Negative  Urine Legionella Antigen 2/20 >>>   Subjective: Patient sitting up in bed with complaints of shortness of breath although sats > 95%. No chest pain. Per daughter patient does have neuropathy and sometimes does complain of  pain and neuropathy with some associated shortness of breath.  Objective: Vitals:   10/10/16 2133 10/10/16 2225 10/11/16 0538 10/11/16 0739  BP: 114/72  126/77   Pulse: 76  68   Resp: 17  17   Temp: (!) 96.8 F (36 C)  98 F (36.7 C)   TempSrc: Axillary  Oral   SpO2: 100%  99% 97%  Weight:   62.1 kg (136 lb 14.4 oz)   Height:  5\' 7"  (1.702 m)      Intake/Output Summary (Last 24 hours) at 10/11/16 1301 Last data filed at 10/11/16 1000  Gross per 24 hour  Intake              130 ml  Output             1750 ml  Net            -1620 ml   Filed Weights   10/09/16 0432 10/10/16 0053 10/11/16 0538  Weight: 66.1 kg (145 lb 11.2 oz) 61.9 kg (136 lb 6.4 oz) 62.1 kg (136 lb 14.4 oz)    Examination:  General exam: Appears calm and comfortable  Respiratory system: Decreased breath sounds in the bases. Cardiovascular system: Irregularly irregular. No JVD, murmurs, rubs, gallops or clicks. No pedal edema. Gastrointestinal system: Abdomen is nondistended, soft and nontender. No organomegaly or masses felt. Normal bowel sounds heard. Central nervous system: Alert and oriented. No focal neurological deficits. Extremities: Symmetric 5 x 5 power. Skin: No rashes, lesions or ulcers Psychiatry: Judgement and insight appear fair. Mood & affect appropriate.     Data Reviewed: I have personally reviewed following labs and imaging studies  CBC:  Recent Labs Lab 10/06/16 1320 10/08/16 0330 10/09/16 0431 10/10/16 0417 10/11/16 0325  WBC 10.9* 7.4 7.9 7.0 7.4  NEUTROABS 9.7*  --   --   --   --   HGB 9.5* 8.5* 8.9* 9.7* 10.1*  HCT 28.4* 25.4* 27.4* 29.2* 30.4*  MCV 94.7 94.1 95.8 95.1 93.8  PLT 206 155 149* 155 [id number]   Basic Metabolic Panel:  Recent Labs Lab 10/07/16 0427 10/08/16 0330 10/09/16 0431 10/09/16 0432 10/10/16 0417 10/11/16 0325  NA 139 140  --  140 139 136  K 3.3* 3.1*  --  4.3 4.0 3.6  CL 101 102  --  103 99* 95*  CO2 26 29  --  30 31 31   GLUCOSE 133* 81  --   84 87 80  BUN 21* 18  --  15 15 13   CREATININE 1.54* 1.35*  --  1.12* 0.98 1.02*  CALCIUM 8.8* 8.4*  --  8.5* 9.1 9.0  MG 2.0 1.9 1.8  --  1.9 1.9  PHOS 2.6 2.2*  --  2.9 2.8 3.3   GFR: Estimated Creatinine Clearance: 37.8 mL/min (by C-G formula based on SCr of 1.02 mg/dL (H)). Liver Function Tests:  Recent Labs Lab 10/05/16 0610 10/07/16 0427 10/08/16 0330 10/09/16 0432 10/10/16 0417 10/11/16 0325  AST 60*  --   --   --   --   --   ALT 38  --   --   --   --   --   ALKPHOS 66  --   --   --   --   --   BILITOT 0.9  --   --   --   --   --   PROT 5.1*  --   --   --   --   --   ALBUMIN 2.6* 2.6* 2.2* 2.3* 2.5* 2.5*   No results for input(s): LIPASE, AMYLASE in the last 168 hours. No results for input(s): AMMONIA in the last 168 hours. Coagulation Profile:  Recent Labs Lab 10/07/16 0427 10/08/16 0330 10/09/16 0431 10/10/16 0417 10/11/16 0325  INR 1.59 1.44 1.19 1.22 1.19   Cardiac Enzymes: No results for input(s): CKTOTAL, CKMB, CKMBINDEX, TROPONINI in the last 168 hours. BNP (last 3 results) No results for input(s): PROBNP in the last 8760 hours. HbA1C: No results for input(s): HGBA1C in the last 72 hours. CBG: No results for input(s): GLUCAP in the last 168 hours. Lipid Profile: No results for input(s): CHOL, HDL, LDLCALC, TRIG, CHOLHDL, LDLDIRECT in the last 72 hours. Thyroid Function Tests: No results for input(s): TSH, T4TOTAL, FREET4, T3FREE, THYROIDAB in the last 72 hours. Anemia Panel: No results for input(s): VITAMINB12, FOLATE, FERRITIN, TIBC, IRON, RETICCTPCT in the last 72 hours. Sepsis Labs:  Recent Labs Lab 10/06/16 1320 10/07/16 0427 10/08/16 0330  PROCALCITON 0.11 0.18 0.17    Recent Results (from the past 240 hour(s))  Culture, blood (routine x 2) Call MD if unable to obtain prior to antibiotics being given     Status: None   Collection Time: 10/03/16  7:50 PM  Result Value Ref Range Status   Specimen Description BLOOD LEFT ANTECUBITAL   Final   Special Requests BOTTLES DRAWN AEROBIC AND ANAEROBIC 5CC EA  Final   Culture NO GROWTH 5 DAYS  Final   Report Status 10/09/2016 FINAL  Final  Culture, blood (routine x 2) Call MD if unable to obtain prior to antibiotics being given     Status: None   Collection  Time: 10/03/16 10:00 PM  Result Value Ref Range Status   Specimen Description BLOOD RIGHT WRIST  Final   Special Requests BOTTLES DRAWN AEROBIC AND ANAEROBIC 5CC EA  Final   Culture NO GROWTH 5 DAYS  Final   Report Status 10/09/2016 FINAL  Final  Culture, Urine     Status: None   Collection Time: 10/04/16  1:23 AM  Result Value Ref Range Status   Specimen Description URINE, CATHETERIZED  Final   Special Requests NONE  Final   Culture NO GROWTH  Final   Report Status 10/05/2016 FINAL  Final  MRSA PCR Screening     Status: None   Collection Time: 10/04/16  1:53 AM  Result Value Ref Range Status   MRSA by PCR NEGATIVE NEGATIVE Final    Comment:        The GeneXpert MRSA Assay (FDA approved for NASAL specimens only), is one component of a comprehensive MRSA colonization surveillance program. It is not intended to diagnose MRSA infection nor to guide or monitor treatment for MRSA infections.          Radiology Studies: No results found.      Scheduled Meds: . alteplase  2 mg Intracatheter Once  . budesonide  0.5 mg Nebulization BID  . dorzolamide  1 drop Left Eye BID  . furosemide  40 mg Intravenous BID  . gabapentin  600 mg Oral QHS  . ipratropium  0.5 mg Nebulization TID  . levalbuterol  0.63 mg Nebulization TID  . levothyroxine  75 mcg Oral QAC breakfast  . lisinopril  5 mg Oral Daily  . mouth rinse  15 mL Mouth Rinse BID  . methocarbamol  500 mg Oral BID  . pilocarpine  1 drop Left Eye BID  . sodium chloride flush  10-40 mL Intracatheter Q12H  . spironolactone  25 mg Oral Daily  . timolol  1 drop Left Eye BID  . Travoprost (BAK Free)  1 drop Left Eye QHS  . warfarin  2 mg Oral ONCE-1800  .  Warfarin - Pharmacist Dosing Inpatient   Does not apply q1800   Continuous Infusions: . heparin 1,050 Units/hr (10/11/16 0650)     LOS: 8 days    Time spent: 59 mins    Marcelo Ickes, MD [facility] Pager [phone]  If 7PM-7AM, please contact night-coverage [email or web address] Password [email or web address] 10/11/2016, 1:01 PM

## 2016-10-11 NOTE — Progress Notes (Signed)
Contacted Caitlyn in pharmacy, informing her that I was unable to scan patient and heparin, that there was non break in the heparin infusion.

## 2016-10-12 LAB — CBC
HEMATOCRIT: 31.3 % — AB (ref 36.0–46.0)
HEMOGLOBIN: 10.5 g/dL — AB (ref 12.0–15.0)
MCH: 31.3 pg (ref 26.0–34.0)
MCHC: 33.5 g/dL (ref 30.0–36.0)
MCV: 93.2 fL (ref 78.0–100.0)
Platelets: 185 10*3/uL (ref 150–400)
RBC: 3.36 MIL/uL — AB (ref 3.87–5.11)
RDW: 15 % (ref 11.5–15.5)
WBC: 7.1 10*3/uL (ref 4.0–10.5)

## 2016-10-12 LAB — MAGNESIUM: MAGNESIUM: 1.9 mg/dL (ref 1.7–2.4)

## 2016-10-12 LAB — RENAL FUNCTION PANEL
ANION GAP: 8 (ref 5–15)
Albumin: 2.6 g/dL — ABNORMAL LOW (ref 3.5–5.0)
BUN: 13 mg/dL (ref 6–20)
CHLORIDE: 94 mmol/L — AB (ref 101–111)
CO2: 31 mmol/L (ref 22–32)
CREATININE: 1.09 mg/dL — AB (ref 0.44–1.00)
Calcium: 8.9 mg/dL (ref 8.9–10.3)
GFR calc Af Amer: 51 mL/min — ABNORMAL LOW (ref >60)
GFR, EST NON AFRICAN AMERICAN: 44 mL/min — AB (ref >60)
Glucose, Bld: 88 mg/dL (ref 65–99)
POTASSIUM: 3.4 mmol/L — AB (ref 3.5–5.1)
Phosphorus: 3.7 mg/dL (ref 2.5–4.6)
Sodium: 133 mmol/L — ABNORMAL LOW (ref 135–145)

## 2016-10-12 LAB — PROTIME-INR
INR: 1.49
Prothrombin Time: 18.1 seconds — ABNORMAL HIGH (ref 11.4–15.2)

## 2016-10-12 LAB — HEPARIN LEVEL (UNFRACTIONATED)
HEPARIN UNFRACTIONATED: 0.25 [IU]/mL — AB (ref 0.30–0.70)
Heparin Unfractionated: 0.86 IU/mL — ABNORMAL HIGH (ref 0.30–0.70)

## 2016-10-12 MED ORDER — FUROSEMIDE 40 MG PO TABS
40.0000 mg | ORAL_TABLET | Freq: Every day | ORAL | Status: DC
  Administered 2016-10-13: 40 mg via ORAL
  Filled 2016-10-12: qty 1

## 2016-10-12 MED ORDER — POTASSIUM CHLORIDE CRYS ER 20 MEQ PO TBCR
40.0000 meq | EXTENDED_RELEASE_TABLET | Freq: Once | ORAL | Status: AC
  Administered 2016-10-12: 40 meq via ORAL
  Filled 2016-10-12: qty 2

## 2016-10-12 MED ORDER — FLUOXETINE HCL 20 MG/5ML PO SOLN
20.0000 mg | Freq: Every day | ORAL | Status: DC
  Administered 2016-10-12 – 2016-10-15 (×4): 20 mg via ORAL
  Filled 2016-10-12 (×4): qty 5

## 2016-10-12 MED ORDER — PANTOPRAZOLE SODIUM 40 MG PO TBEC
40.0000 mg | DELAYED_RELEASE_TABLET | Freq: Every day | ORAL | Status: DC
  Administered 2016-10-12 – 2016-10-15 (×4): 40 mg via ORAL
  Filled 2016-10-12 (×4): qty 1

## 2016-10-12 MED ORDER — SODIUM CHLORIDE 0.9 % IV BOLUS (SEPSIS)
250.0000 mL | Freq: Once | INTRAVENOUS | Status: AC
  Administered 2016-10-12: 250 mL via INTRAVENOUS

## 2016-10-12 MED ORDER — WARFARIN SODIUM 2 MG PO TABS
2.0000 mg | ORAL_TABLET | Freq: Once | ORAL | Status: AC
  Administered 2016-10-12: 2 mg via ORAL
  Filled 2016-10-12: qty 1

## 2016-10-12 MED ORDER — SODIUM CHLORIDE 0.9 % IV BOLUS (SEPSIS)
500.0000 mL | Freq: Once | INTRAVENOUS | Status: AC
  Administered 2016-10-12: 500 mL via INTRAVENOUS

## 2016-10-12 NOTE — Progress Notes (Signed)
Spoke to daughter, Madaline Savage, updating her on patient's status. She was appreciative. Will continue to monitor patient. Bed alarms on, safety maintained.

## 2016-10-12 NOTE — Progress Notes (Signed)
Will see her in the morning  Adrian Prows, MD 10/13/2016, 7:39 AM [facility] [place] Pager: [phone] Office: [phone] If no answer: Cell:  [phone]

## 2016-10-12 NOTE — Progress Notes (Signed)
[place] for heparin and coumadin  Indication: atrial fibrillation  No Known Allergies  Patient Measurements: Height: 5\' 7"  (170.2 cm) Weight: 136 lb 14.4 oz (62.1 kg) IBW/kg (Calculated) : 61.6  Vital Signs: BP: 84/39 (02/26 1442) Pulse Rate: 80 (02/26 1442)  Labs:  Recent Labs  10/10/16 0417  10/11/16 0325 10/11/16 1125 10/12/16 0408 10/12/16 1436  HGB 9.7*  --  10.1*  --  10.5*  --   HCT 29.2*  --  30.4*  --  31.3*  --   PLT 155  --  157  --  185  --   LABPROT 15.5*  --  15.1  --  18.1*  --   INR 1.22  --  1.19  --  1.49  --   HEPARINUNFRC 0.23*  < >  --  0.52 0.86* 0.25*  CREATININE 0.98  --  1.02*  --  1.09*  --   < > = values in this interval not displayed.  Estimated Creatinine Clearance: 35.4 mL/min (by C-G formula based on SCr of 1.09 mg/dL (H)).  Assessment: 83 yo female admitted with acute hypoxic respiratory failure on Coumadin 1mg  daily PTA for afib. Transitioned to heparin infusion on admission and Coumadin held due to inability to tolerate PO meds. Coumadin resumed on 2/24 and remains on heparin infusion while INR is below goal.  Heparin level was elevated this morning and rate was decreased, however on recheck level is just below goal at 0.25 units/mL. INR up to 1.49 today.   CBC stable, no bleeding noted.  Goal of Therapy:  INR 2-3 Heparin level 0.3-0.7 units/ml Monitor platelets by anticoagulation protocol: Yes   Plan:  -Increase heparin infusion to 1000 units/hr  -Coumadin 2mg  po x1 tonight  -Daily heparin level, CBC and INR  Isabell Bonafede D. Idan Prime, PharmD, BCPS Clinical Pharmacist Pager: [phone] 10/12/2016 3:16 PM

## 2016-10-12 NOTE — Care Management Important Message (Signed)
Important Message  Patient Details  Name: [name] MRN: [id number] Date of Birth: 11-01-1926   Medicare Important Message Given:  Yes    Nathen May 10/12/2016, 1:47 PM

## 2016-10-12 NOTE — Progress Notes (Signed)
[id number] [name],Adrienne: Had 9 beat run of V-tach. B/P 77/39; comparable in both arms. Has been sleeping, with no complaints. Thank you, Santiago Glad    This page sent to NP at [employer].

## 2016-10-12 NOTE — Progress Notes (Signed)
  Speech Language Pathology Treatment: Dysphagia  Patient Details Name: [name] MRN: [id number] DOB: [birth year] Today's Date: 10/12/2016 Time: [id number] SLP Time Calculation (min) (ACUTE ONLY): 32 min  Assessment / Plan / Recommendation Clinical Impression  Skilled ST session focused on education with daughter and determining safest and most appropriate texture. Full liquids recommended initially due to esophageal impairments, visible reflux during previous MBS and globus sensation during treatment session 2/22. Daughter report pt's swallow "is better". Pt had esophagus stretched in 201 and SLP educated her that pt may need to return to GI for current esophageal assessment. Pt consumed diced peaches with moderate verbal and tactile assist for smaller bites. Immediate and delayed coughing noted x 2. Pt is at increased risk for aspiration primarily from an esophageal perspective. Spoke with daughter at length re: softer textures pt would eat and upgraded diet to regular therefore daughter can chose and order softer menu items. Continue to follow.     HPI HPI: [place] a 83 y.o.femalewith medical history significant forchronic atrial fibrillation on warfarin, COPD, chronic diastolic CHF, hypothyroidism, hypertension, and depression who now presents to the emergency department for evaluation of productive cough and generalized weakness. Patient had reportedly developed a productive cough and dyspnea approximately one month ago and was started on antibiotics at that time. Her symptoms failed to improve and she was recently started on a second course of antibiotics with Levaquin, in addition to prednisone. She completed the prednisone and finished her Levaquin today, but reports no improvement in her cough or dyspnea, and worsening in her generalized weakness and lethargy. Chest x-ray reveals a new air space consolidation in the lingula concerning for pneumonia. Respiratory status improved in  the ED, patient on [place]. Admitted to the stepdown unit for ongoing evaluation and management of sepsis secondary to pneumonia. No prior history of dysphagia per chart. Referred by MD for swallowing evaluation due to coughing with fluids, recurrent pneumonia. MBS on 10/04/16 revealed esophageal impairments and SLP recommended GI consult, further esophageal assessment, and NPO with ice chips PRN after oral care.       SLP Plan  Continue with current plan of care       Recommendations  Diet recommendations: Regular (daughter will order softer foods for pt) Liquids provided via: Cup;No straw Medication Administration: Crushed with puree Supervision: Staff to assist with self feeding;Full supervision/cueing for compensatory strategies Compensations: Slow rate;Small sips/bites;Lingual sweep for clearance of pocketing Postural Changes and/or Swallow Maneuvers: Seated upright 90 degrees                Oral Care Recommendations: Oral care BID Follow up Recommendations:  (TBD) SLP Visit Diagnosis: Dysphagia, pharyngoesophageal phase (R13.14) Plan: Continue with current plan of care       GO                Houston Siren 10/12/2016, 2:28 PM  Orbie Pyo Colvin Caroli.Ed [employer] [phone]

## 2016-10-12 NOTE — Progress Notes (Signed)
Daily Progress Note   Patient Name: [name]       Date: 10/12/2016 DOB: 1926-09-25  Age: 83 y.o. MRN#: [id number] Attending Physician: Eugenie Filler, MD Primary Care Physician: Leonides Sake, MD Admit Date: 10/03/2016  Reason for Consultation/Follow-up: Establishing goals of care  Subjective: Patient states "im feeling better".  Dtr, Baker Janus at bedside with multiple concerns.  Patient's son, [name], is in [facility] with a recurrent infection.  [name] normally cares for the patient at night.   Dtr asks if [name] can visit his mother in her room. Dtr was upset that her mother was not receiving prozac, gabapentin and several medications.  We discussed prolonged QTC and low blood pressure were two of the reasons these meds were held.  It appears patient will likely go to SNF ([facility]) when ready although dtr wants to discuss this with [name] before it is finalized.  Will ask for speech to reassess as patient did so well eating breakfast this am.  PEG is not an issue at this time.  Assessment: Patient improved, but still having difficulties with fluid volume vs BP and pulse rate. Family asking to see their primary cardiologist Dr. Einar Gip.   Patient Profile/HPI: 83 y.o. female  with past medical history of COPD, esophageal dysphagia, and an inability to walk, who was admitted on 10/03/2016 with an acute heart failure exacerbation, pneumonia (CAP vs ASP), and acute kidney injury.  Per the notes the patient had been suffering with a URI for a month.  It did not improve with outpatient antibiotics or prednisone.  Also over the past month she had been becoming progressively more weak, fatigued and had poor PO intake.    Length of Stay: 9  Current Medications: Scheduled Meds:  . budesonide  0.5 mg  Nebulization BID  . dorzolamide  1 drop Left Eye BID  . FLUoxetine  20 mg Oral Daily  . furosemide  40 mg Oral Daily  . gabapentin  600 mg Oral QHS  . ipratropium  0.5 mg Nebulization TID  . levalbuterol  0.63 mg Nebulization TID  . levothyroxine  75 mcg Oral QAC breakfast  . mouth rinse  15 mL Mouth Rinse BID  . methocarbamol  500 mg Oral BID  . pantoprazole  40 mg Oral Daily  . pilocarpine  1 drop Left  Eye BID  . sodium chloride flush  10-40 mL Intracatheter Q12H  . spironolactone  25 mg Oral Daily  . timolol  1 drop Left Eye BID  . Travoprost (BAK Free)  1 drop Left Eye QHS  . Warfarin - Pharmacist Dosing Inpatient   Does not apply q1800    Continuous Infusions: . heparin 900 Units/hr (10/12/16 0715)    PRN Meds: acetaminophen, levalbuterol, oxyCODONE-acetaminophen, sodium chloride flush  Physical Exam      Thin frail elderly female, sitting up in bed very pleasant.  Getting a bath.  Vital Signs: BP (!) 77/47 (BP Location: Left Arm)   Pulse 93   Temp 97.6 F (36.4 C) (Oral)   Resp 20   Ht 5\' 7"  (1.702 m)   Wt 62.1 kg (136 lb 14.4 oz)   SpO2 97%   BMI 21.44 kg/m  SpO2: SpO2: 97 % O2 Device: O2 Device: Not Delivered O2 Flow Rate: O2 Flow Rate (L/min): 3 L/min  Intake/output summary:  Intake/Output Summary (Last 24 hours) at 10/12/16 1044 Last data filed at 10/12/16 [id number]  Gross per 24 hour  Intake              120 ml  Output             2050 ml  Net            -1930 ml   LBM: Last BM Date: 10/09/16 Baseline Weight: Weight: 66.3 kg (146 lb 1.6 oz) Most recent weight: Weight: 62.1 kg (136 lb 14.4 oz)       Palliative Assessment/Data:    Flowsheet Rows   Flowsheet Row Most Recent Value  Intake Tab  Referral Department  Hospitalist  Unit at Time of Referral  Med/Surg Unit  Palliative Care Primary Diagnosis  Cardiac  Date Notified  10/09/16  Palliative Care Type  New Palliative care  Reason Not Seen  Consult cancelled  Reason for referral  Clarify Goals  of Care  Date of Admission  10/04/16  Date first seen by Palliative Care  10/09/16  # of days Palliative referral response time  0 Day(s)  # of days IP prior to Palliative referral  5  Clinical Assessment  Palliative Performance Scale Score  30%  Psychosocial & Spiritual Assessment  Palliative Care Outcomes  Patient/Family meeting held?  Yes  Palliative Care Outcomes  Clarified goals of care      Patient Active Problem List   Diagnosis Date Noted  . Respiratory distress   . Acute systolic heart failure ([place]) 10/09/2016  . Cardiomyopathy ([place]) 10/09/2016  . Goals of care, counseling/discussion   . Palliative care encounter   . Encounter for hospice care discussion   . Dysphagia   . COPD with acute exacerbation ([place]) 10/04/2016  . Sepsis due to pneumonia ([place]) 10/04/2016  . Acute encephalopathy 10/04/2016  . Debility   . CAP (community acquired pneumonia) 10/03/2016  . AKI (acute kidney injury) ([place]) 10/03/2016  . Hypokalemia 10/03/2016  . Normocytic anemia 10/03/2016  . Acute respiratory failure with hypoxia ([place]) 10/03/2016  . Chronic diastolic CHF (congestive heart failure) ([place]) 10/03/2016  . Hypotension 10/03/2016  . Atrial fibrillation ([place])   . Hypothyroidism   . Glaucoma   . Neuropathy ([place])   . Osteoporosis   . Osteoarthritis   . Fall   . Trimalleolar fracture   . HYPOTHYROIDISM 03/02/2007  . GLAUCOMA 03/02/2007  . ATRIAL FIBRILLATION 03/02/2007  . PSORIASIS 03/02/2007    Palliative Care Plan  Recommendations/Plan:  Will add back protonix at dtr's request.  Have talked with [facility] and left a message for Dr. Einar Gip at Dtr's request.  Anticipate patient will need SNF.  Dtr will not finalize this decision until she can discuss it with her brother.  If SNF Dtr will only accept [facility].  Goals of Care and Additional Recommendations:  Limitations on Scope of Treatment: Full Scope Treatment  Code Status:  DNR  Prognosis:   Unable to  determine, but likely less than 12 months given COPE, CHF, Afib, Frailty.  Patient transfers with arms but does not walk or stand   Discharge Planning:  SNF vs home with home health.  Care plan was discussed with Patient and dtr, and cardiology  Thank you for allowing the Palliative Medicine Team to assist in the care of this patient.  Total time spent:  35     Greater than 50%  of this time was spent counseling and coordinating care related to the above assessment and plan.  Imogene Burn, PA-C Palliative Medicine  Please contact Palliative MedicineTeam phone at [phone] for questions and concerns between 7 am - 7 pm.   Please see AMION for individual provider pager numbers.

## 2016-10-12 NOTE — Progress Notes (Signed)
ANTICOAGULATION CONSULT NOTE  Pharmacy Consult for heparin and coumadin  Indication: atrial fibrillation  No Known Allergies  Patient Measurements: Height: 5\' 7"  (170.2 cm) Weight: 136 lb 14.4 oz (62.1 kg) IBW/kg (Calculated) : 61.6  Vital Signs: Temp: 97.6 F (36.4 C) (02/25 2143) Temp Source: Oral (02/25 2143) BP: 93/52 (02/25 2143) Pulse Rate: 73 (02/25 2143)  Labs:  Recent Labs  10/10/16 0417 10/10/16 1846 10/11/16 0325 10/11/16 1125 10/12/16 0408  HGB 9.7*  --  10.1*  --  10.5*  HCT 29.2*  --  30.4*  --  31.3*  PLT 155  --  157  --  185  LABPROT 15.5*  --  15.1  --  18.1*  INR 1.22  --  1.19  --  1.49  HEPARINUNFRC 0.23* 0.35  --  0.52 0.86*  CREATININE 0.98  --  1.02*  --  1.09*    Estimated Creatinine Clearance: 35.4 mL/min (by C-G formula based on SCr of 1.09 mg/dL (H)).  Assessment: 83 yo female admitted with acute hypoxic respiratory failure on coumadiin PTA for afib. Transitioned to heparin gtt on admission and coumadin held due to inability to tolerate PO meds. Coumadin resumed on 2/24 and remains on hep gtt.   Heparin level now supratherapeutic at 0.86. No bleeding noted. CBC stable.  Goal of Therapy:  INR 2-3 Heparin level 0.3-0.7 units/ml Monitor platelets by anticoagulation protocol: Yes   Plan:  -Decrease heparin infusion to 900 units/hr  -Will f/u 8 hr heparin level  Sherlon Handing, PharmD, BCPS Clinical pharmacist, pager [phone] 10/12/2016, 5:28 AM

## 2016-10-12 NOTE — Progress Notes (Signed)
PROGRESS NOTE    [name]  [id number] DOB: 1927-07-03 DOA: 10/03/2016 PCP: Leonides Sake, MD    Brief Narrative: Patient is a 83 year old female history of CHF, A. fib on chronic anticoagulation, COPD who presented with acute hypoxic respiratory failure likely secondary to acute CHF exacerbation. Patient also noted to be in acute renal failure. Because of also possible pneumonia. Patient seen by critical care and was being managed by critical care. Patient with some clinical improvement and transferred to the floor. Patient noted to have a new cardiomyopathy with a EF of approximately 30%. Cardiology consulted. Patient also with some dysphagia. Palliative care consultation for goals of care.   Assessment & Plan:   Principal Problem:   Acute respiratory failure with hypoxia (HCC) Active Problems:   Acute systolic heart failure (HCC)   Cardiomyopathy ([place])   Atrial fibrillation (HCC)   Hypothyroidism   CAP (community acquired pneumonia)   AKI (acute kidney injury) ([place])   Hypokalemia   Normocytic anemia   Chronic diastolic CHF (congestive heart failure) (HCC)   Hypotension   COPD with acute exacerbation (HCC)   Sepsis due to pneumonia ([place])   Acute encephalopathy   Goals of care, counseling/discussion   Palliative care encounter   Encounter for hospice care discussion   Dysphagia   Respiratory distress  #1 acute respiratory failure with hypoxia secondary to acute systolic heart failure and pleural effusions in the setting of restrictive lung disease. Patient initially admitted to critical care and initially being treated for probable pneumonia and COPD exacerbation. Chest x-ray consistent with volume overload. Patient with elevated BNP. 2-D echo which was done with a new cardiomyopathy with a EF of 30-35% with wall motion abnormalities. Patient with multiple comorbidities. Patient asking for aggressive treatment. Patient on IV Lasix 40 mg twice a day with a urine output  of 2.0 L over the past 24 hours. Patient also with issues of dysphagia with esophageal deficits. Patient noted to be hypotensive today and as such diuretics and antihypertensives have been held. Cardiology following and appreciate input and recommendations.   #2 hypokalemia Likely secondary to diuresis. Repleted.  #3 atrial fibrillation CHA2DS2-VASC 5 Currently rate controlled.. Beta blocker has been on hold in order to allow fluid removal by critical care. Per cardiology may resume beta blocker on discharge it pressure tolerates. Patient currently on IV heparin/ Coumadin per pharmacy.  #4 acute renal failure Likely secondary to acute systolic heart failure. Improved with diuresis. Diuretics on hold secondary to hypotension. Follow.  #5 protein calorie malnutrition Patient has been assessed by speech therapy. Patient with some significant esophageal deficits and at increased risk for aspiration. Patient diet has been advanced to a soft diet. Monitor.  #6 dysphagia Patient with some esophageal deficits. Patient with increased risk of aspiration. Patient currently on a full liquid diet. Speech therapy following and diet has been advanced to a regular diet. Follow.  #7 acute encephalopathy Improved.   #8 hypothyroidism Continue IV Synthroid.  #9 neuropathy Resume home regimen gabapentin.  #10 chronic pain Resumed home regimen of Robaxin and Percocet as needed.  #11 hypotension Patient noted to be hypotensive and received 2 50 mL bolus of normal saline. Patient still hypotensive will give normal saline 500 mL bolus 1. Diuretics on hold. Anti-hypertensive medications on hold. Patient seems to be mentating well. Cardiology following.  #11 prognosis Patient with a poor prognosis. Patient presented with acute respiratory failure initially felt to be likely secondary to pneumonia and possible COPD exacerbation. Patient noted  to be in new acute systolic heart failure with a new cardiomyopathy  with a EF of approximately 30%. Due to patient's comorbidities likely a poor candidate for any invasive testing. Patient was aggressive treatment at this time. Cardiology has been consulted. Patient also noted to have a dysphagia. Palliative care has assessed the patient and currently family decide how best to feed the patient has patient does have some dysphagia/esophageal dysmotility issues. Family determining on SNF versus home with home health. Patient's daughter to discuss with her brother on several issues before making final decision. Patient's son recently hospitalized for a vertebral abscess per daughter. Palliative care following.   DVT prophylaxis: Heparin/Coumadin Code Status: DNR Family Communication: updated patient and daughter at bedside. Disposition Plan: Pending PT evaluation and palliative care evaluation. Probable skilled nursing facility versus home with home health   Consultants:   Cardiology Dr. Dorris Carnes 10/09/2016  Palliative care Dr. Rowe Pavy 10/09/2016  Procedures:  PFT 09/12/13: FVC 1.37 L (74%) FEV1 0.98 L (82%) FEV1/FVC 0.71 FEF 25-75 0.71 L (57%) negative bronchodilator response TLC 3.57 L (68%) RV 86% ERV 66%  PORT CXR 2/19:  Personally reviewed by me. Hazy bilateral basilar opacities. Mild alveolar opacities within the mid and upper lung zones as well. PCXR: 2/22 worsening bilateral aeration. Likely element of both edema and effusion but rotated film makes difficult to eval   2-D echo 10/07/2016--- EF [id number], systolic function moderately to severely reduced, severely dilated left atrium. Mildly reduced right ventricular systolic function. Basal to mid anterior septal akinesis. Basal to mid inferior septal akinesis. Basal to mid inferior akinesis.  LINES/TUBES: Foley 2/18 >>> RUE DL PICC 2/18 >>> PIV    Antimicrobials:  Rocephin 2/17 - 2/19 Zosyn 2/19 - 2/20 Azithromycin 2/17 >>> (stop date 2/21) Cefepime 2/20 >>>2/21  Blood Cultures x2 2/17 >>> MRSA  PCR 2/18:  Negative  Urine Culture 2/18:  Negative  Urine Streptococcal Antigen 2/18:  Negative  Urine Legionella Antigen 2/20 >>>   Subjective: Patient denies chest pain or shortness of breath. Patient asking when a Foley catheter can be removed. Patient asking whether she can go home. Patient noted to be hypotensive and as such Lasix and antihypertensive medications held.   Objective: Vitals:   10/12/16 0550 10/12/16 0641 10/12/16 0833 10/12/16 0957  BP:  (!) 80/31  (!) 77/47  Pulse: 67 65 64 93  Resp:   20   Temp:      TempSrc:      SpO2: 94%  95% 97%  Weight:      Height:        Intake/Output Summary (Last 24 hours) at 10/12/16 1310 Last data filed at 10/12/16 0900  Gross per 24 hour  Intake              360 ml  Output             2050 ml  Net            -1690 ml   Filed Weights   10/09/16 0432 10/10/16 0053 10/11/16 0538  Weight: 66.1 kg (145 lb 11.2 oz) 61.9 kg (136 lb 6.4 oz) 62.1 kg (136 lb 14.4 oz)    Examination:  General exam: Appears calm and comfortable  Respiratory system: Decreased breath sounds in the bases. Cardiovascular system: Irregularly irregular. No JVD, murmurs, rubs, gallops or clicks. No pedal edema. Gastrointestinal system: Abdomen is nondistended, soft and nontender. No organomegaly or masses felt. Normal bowel sounds heard. Central nervous system: Alert and  oriented. No focal neurological deficits. Extremities: Symmetric 5 x 5 power. Skin: No rashes, lesions or ulcers Psychiatry: Judgement and insight appear fair. Mood & affect appropriate.     Data Reviewed: I have personally reviewed following labs and imaging studies  CBC:  Recent Labs Lab 10/06/16 1320 10/08/16 0330 10/09/16 0431 10/10/16 0417 10/11/16 0325 10/12/16 0408  WBC 10.9* 7.4 7.9 7.0 7.4 7.1  NEUTROABS 9.7*  --   --   --   --   --   HGB 9.5* 8.5* 8.9* 9.7* 10.1* 10.5*  HCT 28.4* 25.4* 27.4* 29.2* 30.4* 31.3*  MCV 94.7 94.1 95.8 95.1 93.8 93.2  PLT 206 155 149*  155 157 [id number]   Basic Metabolic Panel:  Recent Labs Lab 10/08/16 0330 10/09/16 0431 10/09/16 0432 10/10/16 0417 10/11/16 0325 10/12/16 0408  NA 140  --  140 139 136 133*  K 3.1*  --  4.3 4.0 3.6 3.4*  CL 102  --  103 99* 95* 94*  CO2 29  --  30 31 31 31   GLUCOSE 81  --  84 87 80 88  BUN 18  --  15 15 13 13   CREATININE 1.35*  --  1.12* 0.98 1.02* 1.09*  CALCIUM 8.4*  --  8.5* 9.1 9.0 8.9  MG 1.9 1.8  --  1.9 1.9 1.9  PHOS 2.2*  --  2.9 2.8 3.3 3.7   GFR: Estimated Creatinine Clearance: 35.4 mL/min (by C-G formula based on SCr of 1.09 mg/dL (H)). Liver Function Tests:  Recent Labs Lab 10/08/16 0330 10/09/16 0432 10/10/16 0417 10/11/16 0325 10/12/16 0408  ALBUMIN 2.2* 2.3* 2.5* 2.5* 2.6*   No results for input(s): LIPASE, AMYLASE in the last 168 hours. No results for input(s): AMMONIA in the last 168 hours. Coagulation Profile:  Recent Labs Lab 10/08/16 0330 10/09/16 0431 10/10/16 0417 10/11/16 0325 10/12/16 0408  INR 1.44 1.19 1.22 1.19 1.49   Cardiac Enzymes: No results for input(s): CKTOTAL, CKMB, CKMBINDEX, TROPONINI in the last 168 hours. BNP (last 3 results) No results for input(s): PROBNP in the last 8760 hours. HbA1C: No results for input(s): HGBA1C in the last 72 hours. CBG: No results for input(s): GLUCAP in the last 168 hours. Lipid Profile: No results for input(s): CHOL, HDL, LDLCALC, TRIG, CHOLHDL, LDLDIRECT in the last 72 hours. Thyroid Function Tests: No results for input(s): TSH, T4TOTAL, FREET4, T3FREE, THYROIDAB in the last 72 hours. Anemia Panel: No results for input(s): VITAMINB12, FOLATE, FERRITIN, TIBC, IRON, RETICCTPCT in the last 72 hours. Sepsis Labs:  Recent Labs Lab 10/06/16 1320 10/07/16 0427 10/08/16 0330  PROCALCITON 0.11 0.18 0.17    Recent Results (from the past 240 hour(s))  Culture, blood (routine x 2) Call MD if unable to obtain prior to antibiotics being given     Status: None   Collection Time: 10/03/16  7:50  PM  Result Value Ref Range Status   Specimen Description BLOOD LEFT ANTECUBITAL  Final   Special Requests BOTTLES DRAWN AEROBIC AND ANAEROBIC 5CC EA  Final   Culture NO GROWTH 5 DAYS  Final   Report Status 10/09/2016 FINAL  Final  Culture, blood (routine x 2) Call MD if unable to obtain prior to antibiotics being given     Status: None   Collection Time: 10/03/16 10:00 PM  Result Value Ref Range Status   Specimen Description BLOOD RIGHT WRIST  Final   Special Requests BOTTLES DRAWN AEROBIC AND ANAEROBIC 5CC EA  Final   Culture NO GROWTH 5  DAYS  Final   Report Status 10/09/2016 FINAL  Final  Culture, Urine     Status: None   Collection Time: 10/04/16  1:23 AM  Result Value Ref Range Status   Specimen Description URINE, CATHETERIZED  Final   Special Requests NONE  Final   Culture NO GROWTH  Final   Report Status 10/05/2016 FINAL  Final  MRSA PCR Screening     Status: None   Collection Time: 10/04/16  1:53 AM  Result Value Ref Range Status   MRSA by PCR NEGATIVE NEGATIVE Final    Comment:        The GeneXpert MRSA Assay (FDA approved for NASAL specimens only), is one component of a comprehensive MRSA colonization surveillance program. It is not intended to diagnose MRSA infection nor to guide or monitor treatment for MRSA infections.          Radiology Studies: No results found.      Scheduled Meds: . budesonide  0.5 mg Nebulization BID  . dorzolamide  1 drop Left Eye BID  . FLUoxetine  20 mg Oral Daily  . furosemide  40 mg Oral Daily  . gabapentin  600 mg Oral QHS  . ipratropium  0.5 mg Nebulization TID  . levalbuterol  0.63 mg Nebulization TID  . levothyroxine  75 mcg Oral QAC breakfast  . mouth rinse  15 mL Mouth Rinse BID  . methocarbamol  500 mg Oral BID  . pantoprazole  40 mg Oral Daily  . pilocarpine  1 drop Left Eye BID  . sodium chloride flush  10-40 mL Intracatheter Q12H  . spironolactone  25 mg Oral Daily  . timolol  1 drop Left Eye BID  .  Travoprost (BAK Free)  1 drop Left Eye QHS  . warfarin  2 mg Oral ONCE-1800  . Warfarin - Pharmacist Dosing Inpatient   Does not apply q1800   Continuous Infusions: . heparin 900 Units/hr (10/12/16 0715)     LOS: 9 days    Time spent: 61 mins    THOMPSON,DANIEL, MD [facility] Pager [phone]  If 7PM-7AM, please contact night-coverage [email or web address] Password [email or web address] 10/12/2016, 1:10 PM

## 2016-10-12 NOTE — NC FL2 (Signed)
[place] LEVEL OF CARE SCREENING TOOL     IDENTIFICATION  Patient Name: [name] Birthdate: Jul 30, 1927 Sex: female Admission Date (Current Location): 10/03/2016  [facility] and [place] Number:  Herbalist and Address:  [facility], [place] [address], [place], [place] [address]      Provider Number: [id number]  Attending Physician Name and Address:  Eugenie Filler, MD  Relative Name and Phone Number:       Current Level of Care: Hospital Recommended Level of Care: [place] Prior Approval Number:    Date Approved/Denied:   PASRR Number: [id number]  Discharge Plan: SNF    Current Diagnoses: Patient Active Problem List   Diagnosis Date Noted  . Respiratory distress   . Acute systolic heart failure ([place]) 10/09/2016  . Cardiomyopathy ([place]) 10/09/2016  . Goals of care, counseling/discussion   . Palliative care encounter   . Encounter for hospice care discussion   . Dysphagia   . COPD with acute exacerbation ([place]) 10/04/2016  . Sepsis due to pneumonia ([place]) 10/04/2016  . Acute encephalopathy 10/04/2016  . Debility   . CAP (community acquired pneumonia) 10/03/2016  . AKI (acute kidney injury) ([place]) 10/03/2016  . Hypokalemia 10/03/2016  . Normocytic anemia 10/03/2016  . Acute respiratory failure with hypoxia ([place]) 10/03/2016  . Chronic diastolic CHF (congestive heart failure) ([place]) 10/03/2016  . Hypotension 10/03/2016  . Atrial fibrillation ([place])   . Hypothyroidism   . Glaucoma   . Neuropathy ([place])   . Osteoporosis   . Osteoarthritis   . Fall   . Trimalleolar fracture   . HYPOTHYROIDISM 03/02/2007  . GLAUCOMA 03/02/2007  . ATRIAL FIBRILLATION 03/02/2007  . PSORIASIS 03/02/2007    Orientation RESPIRATION BLADDER Height & Weight     Self, Time, Situation, Place  Normal Incontinent, Indwelling catheter Weight: 136 lb 14.4 oz (62.1 kg) Height:  5\' 7"  (170.2 cm)  BEHAVIORAL SYMPTOMS/MOOD  NEUROLOGICAL BOWEL NUTRITION STATUS      Incontinent Diet  AMBULATORY STATUS COMMUNICATION OF NEEDS Skin   Extensive Assist Verbally Normal                       Personal Care Assistance Level of Assistance  Bathing, Dressing Bathing Assistance: Maximum assistance   Dressing Assistance: Maximum assistance     Functional Limitations Info             SPECIAL CARE FACTORS FREQUENCY  PT (By licensed PT), OT (By licensed OT)     PT Frequency: 5/wk OT Frequency: 5/wk            Contractures      Additional Factors Info  Code Status, Allergies, Psychotropic Code Status Info: DNR Allergies Info: NKA Psychotropic Info: prozac         Current Medications (10/12/2016):  This is the current hospital active medication list Current Facility-Administered Medications  Medication Dose Route Frequency Provider Last Rate Last Dose  . acetaminophen (TYLENOL) suppository 325 mg  325 mg Rectal Q4H PRN Cherene Altes, MD      . budesonide (PULMICORT) nebulizer solution 0.5 mg  0.5 mg Nebulization BID Vianne Bulls, MD   0.5 mg at 10/12/16 [id number]  . dorzolamide (TRUSOPT) 2 % ophthalmic solution 1 drop  1 drop Left Eye BID Vianne Bulls, MD   1 drop at 10/12/16 1000  . FLUoxetine (PROZAC) 20 MG/5ML solution 20 mg  20 mg Oral Daily Eugenie Filler,  MD   20 mg at 10/12/16 1006  . furosemide (LASIX) tablet 40 mg  40 mg Oral Daily Eileen Stanford, PA-C      . gabapentin (NEURONTIN) 250 MG/5ML solution 600 mg  600 mg Oral QHS Eugenie Filler, MD   600 mg at 10/11/16 2217  . heparin ADULT infusion 100 units/mL (25000 units/278mL sodium chloride 0.45%)  1,000 Units/hr Intravenous Continuous Lauren D Bajbus, RPH 10 mL/hr at 10/12/16 1517 1,000 Units/hr at 10/12/16 1517  . ipratropium (ATROVENT) nebulizer solution 0.5 mg  0.5 mg Nebulization TID Eugenie Filler, MD   0.5 mg at 10/12/16 1434  . levalbuterol (XOPENEX) nebulizer solution 0.63 mg  0.63 mg Nebulization Q6H PRN Javier Glazier, MD      . levalbuterol [facility]) nebulizer solution 0.63 mg  0.63 mg Nebulization TID Eugenie Filler, MD   0.63 mg at 10/12/16 1434  . levothyroxine (SYNTHROID, LEVOTHROID) tablet 75 mcg  75 mcg Oral QAC breakfast Eugenie Filler, MD   75 mcg at 10/12/16 0959  . MEDLINE mouth rinse  15 mL Mouth Rinse BID Cherene Altes, MD   15 mL at 10/12/16 0959  . methocarbamol (ROBAXIN) tablet 500 mg  500 mg Oral BID Eugenie Filler, MD   500 mg at 10/12/16 0959  . oxyCODONE-acetaminophen (PERCOCET/ROXICET) 5-325 MG per tablet 1 tablet  1 tablet Oral Q8H PRN Melton Alar, PA-C   1 tablet at 10/12/16 1006  . pantoprazole (PROTONIX) EC tablet 40 mg  40 mg Oral Daily Marianne L York, PA-C      . pilocarpine (PILOCAR) 4 % ophthalmic solution 1 drop  1 drop Left Eye BID Vianne Bulls, MD   1 drop at 10/12/16 1000  . sodium chloride flush (NS) 0.9 % injection 10-40 mL  10-40 mL Intracatheter Q12H Cherene Altes, MD   10 mL at 10/12/16 1000  . sodium chloride flush (NS) 0.9 % injection 10-40 mL  10-40 mL Intracatheter PRN Eugenie Filler, MD   10 mL at 10/11/16 0324  . spironolactone (ALDACTONE) tablet 25 mg  25 mg Oral Daily Eileen Stanford, PA-C   25 mg at 10/12/16 [id number]  . timolol (TIMOPTIC) 0.5 % ophthalmic solution 1 drop  1 drop Left Eye BID Vianne Bulls, MD   1 drop at 10/12/16 1000  . Travoprost (BAK Free) (TRAVATAN) 0.004 % ophthalmic solution SOLN 1 drop  1 drop Left Eye QHS Vianne Bulls, MD   1 drop at 10/11/16 2218  . warfarin (COUMADIN) tablet 2 mg  2 mg Oral ONCE-1800 Lauren D Bajbus, RPH      . Warfarin - Pharmacist Dosing Inpatient   Does not apply q1800 Eugenie Filler, MD         Discharge Medications: Please see discharge summary for a list of discharge medications.  Relevant Imaging Results:  Relevant Lab Results:   Additional Information SS#: [id number]  Jorge Ny, LCSW

## 2016-10-12 NOTE — Progress Notes (Signed)
[place]: update. Gave 250 bolus as ordered. B/P retake is 80/31.  Thank you. Sent to K. Government social research officer.

## 2016-10-13 LAB — CBC
HCT: 30.5 % — ABNORMAL LOW (ref 36.0–46.0)
Hemoglobin: 10.3 g/dL — ABNORMAL LOW (ref 12.0–15.0)
MCH: 31.7 pg (ref 26.0–34.0)
MCHC: 33.8 g/dL (ref 30.0–36.0)
MCV: 93.8 fL (ref 78.0–100.0)
PLATELETS: 166 10*3/uL (ref 150–400)
RBC: 3.25 MIL/uL — AB (ref 3.87–5.11)
RDW: 15.5 % (ref 11.5–15.5)
WBC: 7 10*3/uL (ref 4.0–10.5)

## 2016-10-13 LAB — RENAL FUNCTION PANEL
Albumin: 2.4 g/dL — ABNORMAL LOW (ref 3.5–5.0)
Anion gap: 9 (ref 5–15)
BUN: 13 mg/dL (ref 6–20)
CHLORIDE: 98 mmol/L — AB (ref 101–111)
CO2: 27 mmol/L (ref 22–32)
Calcium: 8.9 mg/dL (ref 8.9–10.3)
Creatinine, Ser: 1.05 mg/dL — ABNORMAL HIGH (ref 0.44–1.00)
GFR calc Af Amer: 54 mL/min — ABNORMAL LOW (ref >60)
GFR, EST NON AFRICAN AMERICAN: 46 mL/min — AB (ref >60)
GLUCOSE: 83 mg/dL (ref 65–99)
POTASSIUM: 4.1 mmol/L (ref 3.5–5.1)
Phosphorus: 3.2 mg/dL (ref 2.5–4.6)
Sodium: 134 mmol/L — ABNORMAL LOW (ref 135–145)

## 2016-10-13 LAB — PROTIME-INR
INR: 1.71
PROTHROMBIN TIME: 20.3 s — AB (ref 11.4–15.2)

## 2016-10-13 LAB — MAGNESIUM: Magnesium: 2 mg/dL (ref 1.7–2.4)

## 2016-10-13 LAB — HEPARIN LEVEL (UNFRACTIONATED): HEPARIN UNFRACTIONATED: 0.59 [IU]/mL (ref 0.30–0.70)

## 2016-10-13 MED ORDER — METOPROLOL SUCCINATE ER 25 MG PO TB24
12.5000 mg | ORAL_TABLET | Freq: Every day | ORAL | Status: DC
  Administered 2016-10-13 – 2016-10-15 (×3): 12.5 mg via ORAL
  Filled 2016-10-13 (×3): qty 1

## 2016-10-13 MED ORDER — WARFARIN SODIUM 1 MG PO TABS
1.0000 mg | ORAL_TABLET | Freq: Once | ORAL | Status: AC
  Administered 2016-10-13: 1 mg via ORAL
  Filled 2016-10-13: qty 1

## 2016-10-13 MED ORDER — FUROSEMIDE 20 MG PO TABS
20.0000 mg | ORAL_TABLET | Freq: Every day | ORAL | Status: DC
Start: 2016-10-14 — End: 2016-10-16
  Administered 2016-10-14 – 2016-10-15 (×2): 20 mg via ORAL
  Filled 2016-10-13 (×2): qty 1

## 2016-10-13 NOTE — Care Management Note (Signed)
Case Management Note  Patient Details  Name: [name] MRN: [id number] Date of Birth: 1927-01-20  Subjective/Objective:  CHF, acute resp failure with hypoxia, Afib, AKI, CAP                  Action/Plan: Discharge Planning: Chart reviewed. CSW following for dc to SNF-rehab. Will continue to follow for dc needs.   PCP Leonides Sake MD    Expected Discharge Date:                Expected Discharge Plan:  [place]  In-House Referral:  Clinical Social Work  Discharge planning Services  CM Consult  Post Acute Care Choice:  NA Choice offered to:  NA  DME Arranged:  N/A DME Agency:  NA  HH Arranged:  NA HH Agency:  NA  Status of Service:  Completed, signed off  If discussed at [place] of Stay Meetings, dates discussed:    Additional Comments:  Erenest Rasher, RN 10/13/2016, 12:20 PM

## 2016-10-13 NOTE — Progress Notes (Signed)
Physical Therapy Treatment Patient Details Name: [name] MRN: [id number] DOB: [birth year] Today's Date: 10/13/2016    History of Present Illness Pt is an 83 y/o female who presents with SOB and intermittent periods of confusion. She was admitted with sepsis 2 CAP and COPD exacerbation. Pts daughter present for this eval.  pt was doing her transfers on her own - all scoot transfers - to toilet and chair and shower etc.  they have had a lot of homehealth in the past and have good set up for pt. she waters her plants and bakes cookies - etc - all from wheelchair level.    PT Comments    Patient progressing today able to sit in chair for lunch and time after per SLP recommendations.  Patient unable to assist much with transfer.  Do not see how she could go home even with family assist.  Will need SNF rehab at d/c.    Follow Up Recommendations  SNF;Supervision/Assistance - 24 hour     Equipment Recommendations  None recommended by PT    Recommendations for Other Services       Precautions / Restrictions Precautions Precautions: Fall Restrictions Weight Bearing Restrictions: No    Mobility  Bed Mobility Overal bed mobility: Needs Assistance Bed Mobility: Sit to Supine     Supine to sit: Max assist Sit to supine: Max assist   General bed mobility comments: assist for legs into bed and shoulders onto bed, assist for rolling for hygiene due to urinary incontinence  Transfers Overall transfer level: Needs assistance Equipment used: 2 person hand held assist Transfers: Squat Pivot Transfers Sit to Stand: Total assist         General transfer comment: +1 total A for scoot pivot to chair, pt able to lean forward to off load hips for transfer, but knees blocked and used pad to move to bed   Ambulation/Gait                 Stairs            Wheelchair Mobility    Modified Rankin (Stroke Patients Only)       Balance Overall balance assessment: Needs  assistance Sitting-balance support: Bilateral upper extremity supported;Feet supported Sitting balance-Leahy Scale: Zero Sitting balance - Comments: severe kyphosis needing assist to prevent anterior LOB Postural control: Other (comment) (anterior lean)                          Cognition Arousal/Alertness: Awake/alert Behavior During Therapy: Flat affect Overall Cognitive Status: History of cognitive impairments - at baseline                      Exercises      General Comments        Pertinent Vitals/Pain Pain Assessment: No/denies pain    Home Living                      Prior Function            PT Goals (current goals can now be found in the care plan section) Acute Rehab PT Goals Patient Stated Goal: return home Progress towards PT goals: Progressing toward goals    Frequency    Min 2X/week      PT Plan Current plan remains appropriate    Co-evaluation             End of Session Equipment Utilized  During Treatment: Oxygen Activity Tolerance: Patient limited by fatigue Patient left: with call bell/phone within reach;in bed;with family/visitor present;with bed alarm set   PT Visit Diagnosis: Muscle weakness (generalized) (M62.81)     Time: [id number] PT Time Calculation (min) (ACUTE ONLY): 16 min  Charges:  $Therapeutic Activity: 8-22 mins                    G Codes:       Reginia Naas Nov 03, 2016, 4:34 PM  Magda Kiel, [place] November 03, 2016

## 2016-10-13 NOTE — Progress Notes (Signed)
Occupational Therapy Treatment Patient Details Name: [name] MRN: [id number] DOB: 08-09-1927 Today's Date: 10/13/2016    History of present illness Pt is an 83 y/o female who presents with SOB and intermittent periods of confusion. She was admitted with sepsis 2 CAP and COPD exacerbation. Pts daughter present for this eval.  pt was doing her transfers on her own - all scoot transfers - to toilet and chair and shower etc.  they have had a lot of homehealth in the past and have good set up for pt. she waters her plants and bakes cookies - etc - all from wheelchair level.   OT comments  Pt fatigued after peri care supine in bed and then transfer to chair. Pt reports incr comfort once in chair nad states "this is nice" Pt requires total +2 (A) to complete transfer to chair level. Pt currently could benefit from SNF level care. Pt with son admitted acutely [facility] per daughter present in room.    Follow Up Recommendations  SNF;Supervision/Assistance - 24 hour    Equipment Recommendations  None recommended by OT    Recommendations for Other Services      Precautions / Restrictions Precautions Precautions: Fall Restrictions Weight Bearing Restrictions: No       Mobility Bed Mobility Overal bed mobility: Needs Assistance Bed Mobility: Supine to Sit     Supine to sit: Max assist     General bed mobility comments: pt unable to complete supine to sit and with strong R lean  Transfers Overall transfer level: Needs assistance Equipment used: 2 person hand held assist Transfers: Squat Pivot Transfers Sit to Stand: +2 physical assistance;Mod assist         General transfer comment: pt requires (A) to power up and pivot to chair    Balance Overall balance assessment: Needs assistance Sitting-balance support: Bilateral upper extremity supported;Feet supported Sitting balance-Leahy Scale: Zero   Postural control: Right lateral lean                          ADL Overall ADL's : Needs assistance/impaired                         Toilet Transfer: +2 for physical assistance;Moderate assistance Toilet Transfer Details (indicate cue type and reason): incontinent on arrival Toileting- Clothing Manipulation and Hygiene: +2 for physical assistance;Moderate assistance         General ADL Comments: pt with strong R lean EOB and required total +2 to transfer to chair      Vision                     Perception     Praxis      Cognition   Behavior During Therapy: Flat affect Overall Cognitive Status: History of cognitive impairments - at baseline                         Exercises     Shoulder Instructions       General Comments      Pertinent Vitals/ Pain       Pain Assessment: No/denies pain  Home Living                                          Prior Functioning/Environment  Frequency  Min 2X/week        Progress Toward Goals  OT Goals(current goals can now be found in the care plan section)  Progress towards OT goals: Progressing toward goals  Acute Rehab OT Goals Patient Stated Goal: return home OT Goal Formulation: With patient/family Time For Goal Achievement: 10/25/16 Potential to Achieve Goals: Good ADL Goals Pt Will Perform Grooming: with supervision;sitting Pt Will Perform Upper Body Bathing: with supervision;sitting Pt Will Perform Lower Body Bathing: with supervision;sit to/from stand Pt Will Transfer to Toilet: with supervision;squat pivot transfer;bedside commode Pt Will Perform Toileting - Clothing Manipulation and hygiene: with supervision;sitting/lateral leans  Plan Discharge plan remains appropriate    Co-evaluation                 End of Session Equipment Utilized During Treatment: Gait belt  OT Visit Diagnosis: Muscle weakness (generalized) (M62.81)   Activity Tolerance Patient tolerated treatment well   Patient Left  in chair;with call bell/phone within reach;with chair alarm set;with nursing/sitter in room   Nurse Communication Mobility status;Precautions        Time: [id number] OT Time Calculation (min): 15 min  Charges: OT General Charges $OT Visit: 1 Procedure OT Treatments $Therapeutic Activity: 8-22 mins   Jeri Modena   OTR/L Pager: [phone] Office: [phone] .    Parke Poisson B 10/13/2016, 3:36 PM

## 2016-10-13 NOTE — Progress Notes (Signed)
[place] for heparin and Coumadin  Indication: atrial fibrillation  No Known Allergies  Patient Measurements: Height: 5\' 7"  (170.2 cm) Weight: 131 lb 9.8 oz (59.7 kg) IBW/kg (Calculated) : 61.6  Vital Signs: Temp: 97.6 F (36.4 C) (02/27 0506) Temp Source: Oral (02/27 0506) BP: 96/41 (02/27 0506) Pulse Rate: 71 (02/27 0506)  Labs:  Recent Labs  10/11/16 0325  10/12/16 0408 10/12/16 1436 10/13/16 0416  HGB 10.1*  --  10.5*  --  10.3*  HCT 30.4*  --  31.3*  --  30.5*  PLT 157  --  185  --  166  LABPROT 15.1  --  18.1*  --  20.3*  INR 1.19  --  1.49  --  1.71  HEPARINUNFRC  --   < > 0.86* 0.25* 0.59  CREATININE 1.02*  --  1.09*  --  1.05*  < > = values in this interval not displayed.  Estimated Creatinine Clearance: 35.6 mL/min (by C-G formula based on SCr of 1.05 mg/dL (H)).  Assessment: 83 yo female admitted with acute hypoxic respiratory failure on Coumadin 1mg  daily PTA for afib. Transitioned to heparin infusion on admission and Coumadin held due to inability to tolerate PO meds. Coumadin resumed on 2/24 and remains on heparin infusion while INR is below goal.  Heparin level in range this morning at 0.59 units/mL. INR up to 1.71. Hgb low but stable, platelets are within normal limits- no bleeding noted.  Goal of Therapy:  INR 2-3 Heparin level 0.3-0.7 units/ml Monitor platelets by anticoagulation protocol: Yes   Plan:  -Cotninue heparin infusion at 1000 units/hr  -Coumadin 1mg  po x1 tonight  -Daily heparin level, CBC and INR  Oluwatamilore Starnes D. Franchesca Veneziano, PharmD, BCPS Clinical Pharmacist Pager: [phone] 10/13/2016 7:20 AM

## 2016-10-13 NOTE — Progress Notes (Signed)
Daily Progress Note   Patient Name: [name]       Date: 10/13/2016 DOB: [birth year]  Age: 83 y.o. MRN#: [id number] Attending Physician: Eugenie Filler, MD Primary Care Physician: Leonides Sake, MD Admit Date: 10/03/2016  Reason for Consultation/Follow-up: Establishing goals of care  Subjective: Patient reports she is feeling better and hopeful for discharge today.   Dtr not at bedside.  Dr. Einar Gip was to see the patient this am.  VSS this am.  Assessment: Improving.  Likely ready for D/C soon.   Patient Profile/HPI: 83 y.o. female  with past medical history of COPD, esophageal dysphagia, and an inability to walk, who was admitted on 10/03/2016 with an acute heart failure exacerbation, pneumonia (CAP vs ASP), and acute kidney injury.  Per the notes the patient had been suffering with a URI for a month.  It did not improve with outpatient antibiotics or prednisone.  Also over the past month she had been becoming progressively more weak, fatigued and had poor PO intake.    Length of Stay: 10  Current Medications: Scheduled Meds:  . budesonide  0.5 mg Nebulization BID  . dorzolamide  1 drop Left Eye BID  . FLUoxetine  20 mg Oral Daily  . furosemide  40 mg Oral Daily  . gabapentin  600 mg Oral QHS  . ipratropium  0.5 mg Nebulization TID  . levalbuterol  0.63 mg Nebulization TID  . levothyroxine  75 mcg Oral QAC breakfast  . mouth rinse  15 mL Mouth Rinse BID  . methocarbamol  500 mg Oral BID  . pantoprazole  40 mg Oral Daily  . pilocarpine  1 drop Left Eye BID  . sodium chloride flush  10-40 mL Intracatheter Q12H  . spironolactone  25 mg Oral Daily  . timolol  1 drop Left Eye BID  . Travoprost (BAK Free)  1 drop Left Eye QHS  . warfarin  1 mg Oral ONCE-1800  . Warfarin -  Pharmacist Dosing Inpatient   Does not apply q1800    Continuous Infusions: . heparin 1,000 Units/hr (10/13/16 0953)    PRN Meds: acetaminophen, levalbuterol, oxyCODONE-acetaminophen, sodium chloride flush  Physical Exam      Thin frail elderly female, sitting up in bed very pleasant.  smiling Cv:  rrr resp no distress on n/c  Vital Signs: BP (!) 96/41 (BP Location: Left Arm)   Pulse 71   Temp 97.6 F (36.4 C) (Oral)   Resp 18   Ht 5\' 7"  (1.702 m)   Wt 59.7 kg (131 lb 9.8 oz)   SpO2 93%   BMI 20.61 kg/m  SpO2: SpO2: 93 % O2 Device: O2 Device: Not Delivered O2 Flow Rate: O2 Flow Rate (L/min): 3 L/min  Intake/output summary:   Intake/Output Summary (Last 24 hours) at 10/13/16 1038 Last data filed at 10/13/16 1006  Gross per 24 hour  Intake            727.3 ml  Output              250 ml  Net            477.3 ml   LBM: Last BM Date: 10/12/16 Baseline Weight: Weight: 66.3 kg (146 lb 1.6 oz) Most recent weight: Weight: 59.7 kg (131 lb 9.8 oz)       Palliative Assessment/Data:    Flowsheet Rows   Flowsheet Row Most Recent Value  Intake Tab  Referral Department  Hospitalist  Unit at Time of Referral  Med/Surg Unit  Palliative Care Primary Diagnosis  Cardiac  Date Notified  10/09/16  Palliative Care Type  New Palliative care  Reason Not Seen  Consult cancelled  Reason for referral  Clarify Goals of Care  Date of Admission  10/04/16  Date first seen by Palliative Care  10/09/16  # of days Palliative referral response time  0 Day(s)  # of days IP prior to Palliative referral  5  Clinical Assessment  Palliative Performance Scale Score  30%  Psychosocial & Spiritual Assessment  Palliative Care Outcomes  Patient/Family meeting held?  Yes  Palliative Care Outcomes  Clarified goals of care      Patient Active Problem List   Diagnosis Date Noted  . Respiratory distress   . Acute systolic heart failure ([place]) 10/09/2016  . Cardiomyopathy ([place]) 10/09/2016  .  Goals of care, counseling/discussion   . Palliative care encounter   . Encounter for hospice care discussion   . Dysphagia   . COPD with acute exacerbation ([place]) 10/04/2016  . Sepsis due to pneumonia ([place]) 10/04/2016  . Acute encephalopathy 10/04/2016  . Debility   . CAP (community acquired pneumonia) 10/03/2016  . AKI (acute kidney injury) ([place]) 10/03/2016  . Hypokalemia 10/03/2016  . Normocytic anemia 10/03/2016  . Acute respiratory failure with hypoxia ([place]) 10/03/2016  . Chronic diastolic CHF (congestive heart failure) ([place]) 10/03/2016  . Hypotension 10/03/2016  . Atrial fibrillation ([place])   . Hypothyroidism   . Glaucoma   . Neuropathy ([place])   . Osteoporosis   . Osteoarthritis   . Fall   . Trimalleolar fracture   . HYPOTHYROIDISM 03/02/2007  . GLAUCOMA 03/02/2007  . ATRIAL FIBRILLATION 03/02/2007  . PSORIASIS 03/02/2007    Palliative Care Plan    Recommendations/Plan:  PMT will follow at a distance - please call if there is something we can help with  Anticipate D/C to [facility] - if not then home with home health.  Goals of Care and Additional Recommendations:  Limitations on Scope of Treatment: Full Scope Treatment  Code Status:  DNR  Prognosis:   Unable to determine, but likely less than 12 months given COPE, CHF, Afib, Frailty.  Patient transfers with arms but does not walk or stand   Discharge Planning:  SNF vs home with home health.  Care plan was  discussed with patient  Thank you for allowing the Palliative Medicine Team to assist in the care of this patient.  Total time spent:  15 min.     Greater than 50%  of this time was spent counseling and coordinating care related to the above assessment and plan.  Imogene Burn, PA-C Palliative Medicine  Please contact Palliative MedicineTeam phone at [phone] for questions and concerns between 7 am - 7 pm.   Please see AMION for individual provider pager numbers.

## 2016-10-13 NOTE — Consult Note (Addendum)
CARDIOLOGY CONSULT NOTE  Patient ID: [name] MRN: [id number] DOB/AGE: [birth year] 83 y.o.  Admit date: 10/03/2016 Referring Physician  Wonda Olds, MD Primary Physician:  Leonides Sake, MD Reason for Consultation  CHF, A. Fib  HPI: [name]  is a 83 y.o. female  With  h/o chronic atrial fibrillation, history of prior tobacco use quitting 2000, COPD, chronic pain syndrome with severe peripheral neuropathy and has been on chronic Neurontin, Percocet and fluoxetine.  She has chronic LV systolic dysfunction, last echocardiogram in 2016 had revealed EF of 40-45%. She also has h/o esophageal stricture s/p dilatation in 2016.   Admitted on 10/03/2016 with pneumonia, thought to be aspiration, congestive heart failure, and needed BiPAP.  She is now recuperated, was initially evaluated and treated by [facility], however as patient follows up with me, patient's family requested that I be involved.  Although 83 years of age, patient is independent and lives by herself, has been wheelchair-bound due to "peripheral neuropathy" however gets along around the house with the help of a wheelchair.  He also wanted me to get involved as patient is resistant to advise by other physicians and also family members regarding future management and discharge planning.  Fortunately she has done well, she is sitting up in bed today, in no acute distress.  States that her dyspnea is improved.  Appetite is also improved.  Prior to presentation she denied any leg edema.  Past Medical History:  Diagnosis Date  . Atrial fibrillation (HCC)    chronic Coumadin.   Marland Kitchen COPD (chronic obstructive pulmonary disease) ([place])   . Glaucoma   . Hypothyroidism   . Neuropathy (HCC)    PERIPHERAL  . Osteoarthritis   . Osteoporosis   . Trimalleolar fracture    LEFT TRIMALLEOLAR FRACTURE, DISLOCATION WITH OBLIQUE FRACTURE OF THE DISTAL FIBULAR DIAPHYSIS, AND NOTED BEING MILDLY COMMINUTED     Past Surgical History:   Procedure Laterality Date  . COLONOSCOPY W/ POLYPECTOMY  11/2010   Dr Fuller Plan.  multiple polyps:cecal, transverse, sigmoid.  largest 8mm, path: tubular adenomas without HGD.  mild sigmoid diverticulosis.   Marland Kitchen ORIF TIBIA & FIBULA FRACTURES  2016  . VAGINAL HYSTERECTOMY       Family History  Problem Relation Age of Onset  . CAD Mother   . Heart failure Father   . Heart failure Brother   . CAD Brother      Social History: Social History   Social History  . Marital status: Widowed    Spouse name: N/A  . Number of children: N/A  . Years of education: N/A   Occupational History  . Not on file.   Social History Main Topics  . Smoking status: Never Smoker  . Smokeless tobacco: Never Used  . Alcohol use No  . Drug use: No  . Sexual activity: Not on file   Other Topics Concern  . Not on file   Social History Narrative  . No narrative on file     Prescriptions Prior to Admission  Medication Sig Dispense Refill Last Dose  . acetaminophen (TYLENOL) 325 MG tablet Take 325 mg by mouth daily as needed for headache (pain).   10/03/2016 at Unknown time  . albuterol (PROVENTIL) (2.5 MG/3ML) 0.083% nebulizer solution Take 2.5 mg by nebulization every 4 (four) hours as needed for wheezing or shortness of breath.    10/02/2016 at Unknown time  . benzonatate (TESSALON) 100 MG capsule Take 100 mg by mouth 3 (three)  times daily.  0 10/03/2016 at 1400  . budesonide (PULMICORT) 0.5 MG/2ML nebulizer solution Take 0.5 mg by nebulization 2 (two) times daily.  0 10/02/2016 at am  . dextromethorphan (DELSYM) 30 MG/5ML liquid Take 30 mg by mouth every 12 (twelve) hours as needed for cough.   PRN at PRN  . Dextromethorphan-Guaifenesin (MUCINEX DM PO) Take 10 mLs by mouth every 12 (twelve) hours.   10/03/2016 at am  . dorzolamide (TRUSOPT) 2 % ophthalmic solution Place 1 drop into the left eye 2 (two) times daily.   10/03/2016 at am  . ergocalciferol (VITAMIN D2) 50000 UNITS capsule Take 50,000 Units by  mouth every Thursday.    10/01/2016  . FLUoxetine (PROZAC) 20 MG capsule Take 20 mg by mouth daily.   10/03/2016 at am  . furosemide (LASIX) 20 MG tablet Take 1 tablet (20 mg total) by mouth 2 (two) times daily. (Patient taking differently: Take 20-40 mg by mouth See admin instructions. Take 1 tablet (20 mg) by mouth every morning and 2 tablets (40 mg) at 2pm) 6 tablet 0 10/03/2016 at am  . gabapentin (NEURONTIN) 600 MG tablet Take 600 mg by mouth at bedtime.   10/02/2016 at Unknown time  . levofloxacin (LEVAQUIN) 500 MG tablet Take 500 mg by mouth daily. 10 day course to be completed 10/04/16   10/03/2016 at am  . levothyroxine (SYNTHROID, LEVOTHROID) 75 MCG tablet Take 75 mcg by mouth daily before breakfast.   10/03/2016 at am  . lisinopril (PRINIVIL,ZESTRIL) 5 MG tablet Take 5 mg by mouth daily.   10/03/2016 at am  . loperamide (IMODIUM) 2 MG capsule Take 2-4 mg by mouth See admin instructions. Take 2 capsules (4 mg) by mouth every morning, may also take 1 capsule (2 mg) during the day as needed for loose stools   10/03/2016 at am  . LUTEIN-ZEAXANTHIN PO Take 1 tablet by mouth daily with supper.   10/02/2016 at Unknown time  . methocarbamol (ROBAXIN) 500 MG tablet Take 500 mg by mouth 2 (two) times daily.   10/03/2016 at am  . metoprolol tartrate (LOPRESSOR) 25 MG tablet Take 1 tablet (25 mg total) by mouth 2 (two) times daily. (Patient taking differently: Take 12.5 mg by mouth 2 (two) times daily. ) 60 tablet 5 10/03/2016 at 1000  . omeprazole (PRILOSEC) 40 MG capsule Take 40 mg by mouth daily.   10/03/2016 at Unknown time  . ondansetron (ZOFRAN-ODT) 4 MG disintegrating tablet Take 4 mg by mouth every 8 (eight) hours as needed for nausea or vomiting.    10/03/2016 at am  . oxyCODONE-acetaminophen (PERCOCET/ROXICET) 5-325 MG per tablet Take 1-2 tablets by mouth every 4 (four) hours as needed for pain. (Patient taking differently: Take 1 tablet by mouth 2 (two) times daily. MAY TAKE 1 ADDITIONAL TABLET ONCE A DAY  IF PAIN PERSISTS) 23 tablet 0 10/03/2016 at am  . pilocarpine (PILOCAR) 4 % ophthalmic solution Place 1 drop into the left eye 2 (two) times daily.    10/03/2016 at am  . spironolactone (ALDACTONE) 25 MG tablet Take 25 mg by mouth daily.   10/03/2016 at am  . timolol (TIMOPTIC) 0.5 % ophthalmic solution Place 1 drop into the left eye 2 (two) times daily.   10/03/2016 at 1100  . travoprost, benzalkonium, (TRAVATAN) 0.004 % ophthalmic solution Place 1 drop into the left eye at bedtime.    10/02/2016 at Unknown time  . warfarin (COUMADIN) 2 MG tablet Take 1 mg by mouth daily with supper.  10/02/2016 at 1800  . potassium chloride (K-DUR) 10 MEQ tablet Take 1 tablet (10 mEq total) by mouth 2 (two) times daily. (Patient not taking: Reported on 07/05/2016) 6 tablet 0 Not Taking at Unknown time     ROS: General: no fevers/chills/night sweats. Has fatigue and general weakness Eyes: no blurry vision, diplopia, or amaurosis ENT: no sore throat or hearing loss Resp: Dyspnea present, no cough CV: no edema or palpitations GI: no abdominal pain, nausea, vomiting, diarrhea, or constipation, no further dysphagia GU: no dysuria, frequency, or hematuria Skin: no rash Neuro: no headache, numbness, tingling, or weakness of extremities Musculoskeletal:Bilateral leg pain and weakness Heme: no bleeding, DVT, or easy bruising Endo: no polydipsia or polyuria    Physical Exam: Blood pressure (!) 89/54, pulse 76, temperature 97.6 F (36.4 C), temperature source Oral, resp. rate 19, height 5\' 7"  (1.702 m), weight 131 lb 9.8 oz (59.7 kg), SpO2 95 %.   General appearance: alert, cooperative, appears stated age and no distress Lungs: clear to auscultation bilaterally Chest wall: no tenderness Heart: irregularly irregular rhythm, no S3 or S4, no rub and no murmur Abdomen: soft, non-tender; bowel sounds normal; no masses,  no organomegaly Extremities: extremities normal, atraumatic, no cyanosis or edema Pulses:  bilateral carotid no bruit, femoral pulses normal, popliteal pulse one s bilateral.  Pedal pulses not felt.  Capillary refill normal. Neurologic: Gait: Not tested.  Reflexes not tested.  Patient is alert and oriented 3.  Labs:   Lab Results  Component Value Date   WBC 7.0 10/13/2016   HGB 10.3 (L) 10/13/2016   HCT 30.5 (L) 10/13/2016   MCV 93.8 10/13/2016   PLT 166 10/13/2016    Recent Labs Lab 10/13/16 0416  NA 134*  K 4.1  CL 98*  CO2 27  BUN 13  CREATININE 1.05*  CALCIUM 8.9  GLUCOSE 83     Recent Labs  10/03/16 1953 10/04/16 2352 10/08/16 0330  BNP 459.3* 1,649.3* 2,042.8*     Recent Labs  07/01/16 1038 07/05/16 1240  TROPONINI <0.03 <0.03  TSH  Recent Labs  10/04/16 0548  TSH 3.249    EKG: atrial fibrillation with controlled ventricular response at the rate of 90 bpm, normal axis.  Nonspecific T abnormality..  2D ECHO Impressions: - The patient was in atrial fibrillation. EF 30-35% with somewhat unusual wall motion abnormality pattern =>basal to mid anteroseptal, basal to mid inferoseptal, and basal to mid inferior akinesis. Mildly dilated RV with mildly decreased systolic function. Moderate aortic insufficiency. Mild mitral regurgitation. Severe biatrial enlargement. Mild pulmonary hypertension.   Radiology: No results found.  Scheduled Meds: . budesonide  0.5 mg Nebulization BID  . dorzolamide  1 drop Left Eye BID  . FLUoxetine  20 mg Oral Daily  . furosemide  40 mg Oral Daily  . gabapentin  600 mg Oral QHS  . ipratropium  0.5 mg Nebulization TID  . levalbuterol  0.63 mg Nebulization TID  . levothyroxine  75 mcg Oral QAC breakfast  . mouth rinse  15 mL Mouth Rinse BID  . methocarbamol  500 mg Oral BID  . pantoprazole  40 mg Oral Daily  . pilocarpine  1 drop Left Eye BID  . sodium chloride flush  10-40 mL Intracatheter Q12H  . spironolactone  25 mg Oral Daily  . timolol  1 drop Left Eye BID  . Travoprost (BAK Free)  1  drop Left Eye QHS  . warfarin  1 mg Oral ONCE-1800  . Warfarin - Pharmacist  Dosing Inpatient   Does not apply q1800   Continuous Infusions: . heparin 1,000 Units/hr (10/13/16 0953)   PRN Meds:.acetaminophen, levalbuterol, oxyCODONE-acetaminophen, sodium chloride flush   Assessment & Plan    Acute on chronic  systolic  and diastolic CHF: EF 99991111. No known CAD but there was aortic atherosclerosis noted on previous imaging. Not a candidate for further ischemic work up.  Now on PO lasix lasix 40mg  Daily. Net negative 2.8L.  -- Home lopressor 25mg  BID, spiro 25mg  daily and lisinopril 5mg  daily and furosemide 20 mg daily. Change furosemide to 20 mg daily for now. Would discontinue aldactone for now and add Metoprolol succinate 12.5 mg daily starting today and if BP is stable in the morning, can be discharged to SNF with OP f/u.   Chronic afib: HRs have been relatively well controlled off AVN blocking agents. HR quite  down into the 30s and 40s. Currently in 30s without significant heart block and hence not of clinical consequence. Continue coumadin for CHADS2VASC score 5 (CHF, HTN, age, vasc diz). Pharmacy managing. If heart rate drops on beta blocker, then we have to consider changing Timolol drops for eyes.   Hypothyroidism: TSH normal   NSVT: Probably due to cardiomyopathy, no plans on cardiac work-up, conservative therapy only. Marland Kitchen  Dysphagia: per primary team   Follow-up with me on 10/22/2016 at 1:15 PM.   Adrian Prows, MD 10/13/2016, 2:26 PM [facility] [place] Pager: [phone] Office: [phone] If no answer Cell [phone]

## 2016-10-13 NOTE — Progress Notes (Signed)
PROGRESS NOTE    [name]  [id number] DOB: 1927/07/18 DOA: 10/03/2016 PCP: Leonides Sake, MD    Brief Narrative: Patient is a 83 year old female history of CHF, A. fib on chronic anticoagulation, COPD who presented with acute hypoxic respiratory failure likely secondary to acute CHF exacerbation. Patient also noted to be in acute renal failure. Because of also possible pneumonia. Patient seen by critical care and was being managed by critical care. Patient with some clinical improvement and transferred to the floor. Patient noted to have a new cardiomyopathy with a EF of approximately 30%. Cardiology consulted. Patient also with some dysphagia. Palliative care consultation for goals of care.   Assessment & Plan:   Principal Problem:   Acute respiratory failure with hypoxia (HCC) Active Problems:   Acute systolic heart failure (HCC)   Cardiomyopathy ([place])   Atrial fibrillation (HCC)   Hypothyroidism   CAP (community acquired pneumonia)   AKI (acute kidney injury) ([place])   Hypokalemia   Normocytic anemia   Chronic diastolic CHF (congestive heart failure) (HCC)   Hypotension   COPD with acute exacerbation (HCC)   Sepsis due to pneumonia ([place])   Acute encephalopathy   Goals of care, counseling/discussion   Palliative care encounter   Encounter for hospice care discussion   Dysphagia   Respiratory distress  #1 acute respiratory failure with hypoxia secondary to acute systolic heart failure and pleural effusions in the setting of restrictive lung disease. Patient initially admitted to critical care and initially being treated for probable pneumonia and COPD exacerbation. Chest x-ray consistent with volume overload. Patient with elevated BNP. 2-D echo which was done with a new cardiomyopathy with a EF of 30-35% with wall motion abnormalities. Patient with multiple comorbidities. Patient asking for aggressive treatment. Patient  Was on IV Lasix twice daily which have  subsequently been held secondary to hypotension. Patient no longer volume overloaded.  Patient is -1.4 L during this hospitalization. Patient has been seen by cardiology and her medications have been adjusted per cardiology. Cardiology following and appreciate input and recommendations.   #2 hypokalemia Likely secondary to diuresis. Repleted.  #3 atrial fibrillation CHA2DS2-VASC 5 Currently rate controlled.. Beta blocker has been on hold in order to allow fluid removal by critical care. Per cardiology may resume beta blocker on discharge it pressure tolerates. Patient currently on IV heparin/ Coumadin per pharmacy.  #4 acute renal failure Likely secondary to acute systolic heart failure. Improved with diuresis. Diuretics on hold secondary to hypotension. Follow.  #5 protein calorie malnutrition Patient has been assessed by speech therapy. Patient with some significant esophageal deficits and at increased risk for aspiration. Patient diet has been advanced to a soft diet/ regular diet that she is tolerating. Monitor.  #6 dysphagia Patient with some esophageal deficits. Patient with increased risk of aspiration. Patient currently on a full liquid diet. Speech therapy following and diet has been advanced to a regular diet. Follow.  #7 acute encephalopathy Improved.   #8 hypothyroidism Change IV Synthroid to oral synthroid.  #9 neuropathy Resume home regimen gabapentin.  #10 chronic pain Resumed home regimen of Robaxin and Percocet as needed.  #11 hypotension Patient noted to be hypotensive  On 10/12/2016 and received  A few boluses of normal saline.IV diuetics were discontinued and antihypertensive medications held. Patient mentating well. Blood pressure improved and in sbp 90-100s. Cardiology following.  #11 prognosis Patient with a poor prognosis. Patient presented with acute respiratory failure initially felt to be likely secondary to pneumonia and possible COPD  exacerbation. Patient  noted to be in new acute systolic heart failure with a new cardiomyopathy with a EF of approximately 30%. Due to patient's comorbidities likely a poor candidate for any invasive testing. Patient was aggressive treatment at this time. Cardiology has been consulted. Patient also noted to have a dysphagia. Palliative care has assessed the patient and currently family decide how best to feed the patient has patient does have some dysphagia/esophageal dysmotility issues. Family determining on SNF versus home with home health. Patient's daughter to discuss with her brother on several issues before making final decision. Patient's son recently hospitalized for a vertebral abscess per daughter. Palliative care following.   DVT prophylaxis: Heparin/Coumadin Code Status: DNR Family Communication: updated patient and daughter at bedside. Disposition Plan:  Skilled nursing facility  When okay with cardiology hopefully tomorrow.   Consultants:   Cardiology Dr. Dorris Carnes 10/09/2016>>>> Dr. Einar Gip 10/13/2016  Palliative care Dr. Rowe Pavy 10/09/2016  Procedures:  PFT 09/12/13: FVC 1.37 L (74%) FEV1 0.98 L (82%) FEV1/FVC 0.71 FEF 25-75 0.71 L (57%) negative bronchodilator response TLC 3.57 L (68%) RV 86% ERV 66%  PORT CXR 2/19:  Personally reviewed by me. Hazy bilateral basilar opacities. Mild alveolar opacities within the mid and upper lung zones as well. PCXR: 2/22 worsening bilateral aeration. Likely element of both edema and effusion but rotated film makes difficult to eval   2-D echo 10/07/2016--- EF 99991111, systolic function moderately to severely reduced, severely dilated left atrium. Mildly reduced right ventricular systolic function. Basal to mid anterior septal akinesis. Basal to mid inferior septal akinesis. Basal to mid inferior akinesis.  LINES/TUBES: Foley 2/18 >>> RUE DL PICC 2/18 >>> PIV    Antimicrobials:  Rocephin 2/17 - 2/19 Zosyn 2/19 - 2/20 Azithromycin 2/17 >>> (stop date  2/21) Cefepime 2/20 >>>2/21  Blood Cultures x2 2/17 >>> MRSA PCR 2/18:  Negative  Urine Culture 2/18:  Negative  Urine Streptococcal Antigen 2/18:  Negative  Urine Legionella Antigen 2/20 >>>   Subjective: Patient sitting up in chair about to eat lunch. Patient denies chest pain or shortness of breath. Patient states she's feeling better. Patient asking whether she can go home.  Patient denies any lightheadedness or dizziness.  Objective: Vitals:   10/12/16 2122 10/13/16 0506 10/13/16 0848 10/13/16 0849  BP: (!) 93/50 (!) 96/41    Pulse: (!) 32 71    Resp: 18 18    Temp: 97.4 F (36.3 C) 97.6 F (36.4 C)    TempSrc: Oral Oral    SpO2: (!) 87% 100% 93% 93%  Weight:  59.7 kg (131 lb 9.8 oz)    Height:        Intake/Output Summary (Last 24 hours) at 10/13/16 1327 Last data filed at 10/13/16 1006  Gross per 24 hour  Intake            627.3 ml  Output              250 ml  Net            377.3 ml   Filed Weights   10/10/16 0053 10/11/16 0538 10/13/16 0506  Weight: 61.9 kg (136 lb 6.4 oz) 62.1 kg (136 lb 14.4 oz) 59.7 kg (131 lb 9.8 oz)    Examination:  General exam: Appears calm and comfortable  Respiratory system: Decreased breath sounds in the bases. Cardiovascular system: Irregularly irregular. No JVD, murmurs, rubs, gallops or clicks. No pedal edema. Gastrointestinal system: Abdomen is nondistended, soft and nontender. No organomegaly or  masses felt. Normal bowel sounds heard. Central nervous system: Alert and oriented. No focal neurological deficits. Extremities: Symmetric 5 x 5 power. Skin: No rashes, lesions or ulcers Psychiatry: Judgement and insight appear fair. Mood & affect appropriate.     Data Reviewed: I have personally reviewed following labs and imaging studies  CBC:  Recent Labs Lab 10/09/16 0431 10/10/16 0417 10/11/16 0325 10/12/16 0408 10/13/16 0416  WBC 7.9 7.0 7.4 7.1 7.0  HGB 8.9* 9.7* 10.1* 10.5* 10.3*  HCT 27.4* 29.2* 30.4* 31.3*  30.5*  MCV 95.8 95.1 93.8 93.2 93.8  PLT 149* 155 157 185 [id number]   Basic Metabolic Panel:  Recent Labs Lab 10/09/16 0431 10/09/16 0432 10/10/16 0417 10/11/16 0325 10/12/16 0408 10/13/16 0416  NA  --  140 139 136 133* 134*  K  --  4.3 4.0 3.6 3.4* 4.1  CL  --  103 99* 95* 94* 98*  CO2  --  30 31 31 31 27   GLUCOSE  --  84 87 80 88 83  BUN  --  15 15 13 13 13   CREATININE  --  1.12* 0.98 1.02* 1.09* 1.05*  CALCIUM  --  8.5* 9.1 9.0 8.9 8.9  MG 1.8  --  1.9 1.9 1.9 2.0  PHOS  --  2.9 2.8 3.3 3.7 3.2   GFR: Estimated Creatinine Clearance: 35.6 mL/min (by C-G formula based on SCr of 1.05 mg/dL (H)). Liver Function Tests:  Recent Labs Lab 10/09/16 0432 10/10/16 0417 10/11/16 0325 10/12/16 0408 10/13/16 0416  ALBUMIN 2.3* 2.5* 2.5* 2.6* 2.4*   No results for input(s): LIPASE, AMYLASE in the last 168 hours. No results for input(s): AMMONIA in the last 168 hours. Coagulation Profile:  Recent Labs Lab 10/09/16 0431 10/10/16 0417 10/11/16 0325 10/12/16 0408 10/13/16 0416  INR 1.19 1.22 1.19 1.49 1.71   Cardiac Enzymes: No results for input(s): CKTOTAL, CKMB, CKMBINDEX, TROPONINI in the last 168 hours. BNP (last 3 results) No results for input(s): PROBNP in the last 8760 hours. HbA1C: No results for input(s): HGBA1C in the last 72 hours. CBG: No results for input(s): GLUCAP in the last 168 hours. Lipid Profile: No results for input(s): CHOL, HDL, LDLCALC, TRIG, CHOLHDL, LDLDIRECT in the last 72 hours. Thyroid Function Tests: No results for input(s): TSH, T4TOTAL, FREET4, T3FREE, THYROIDAB in the last 72 hours. Anemia Panel: No results for input(s): VITAMINB12, FOLATE, FERRITIN, TIBC, IRON, RETICCTPCT in the last 72 hours. Sepsis Labs:  Recent Labs Lab 10/07/16 0427 10/08/16 0330  PROCALCITON 0.18 0.17    Recent Results (from the past 240 hour(s))  Culture, blood (routine x 2) Call MD if unable to obtain prior to antibiotics being given     Status: None    Collection Time: 10/03/16  7:50 PM  Result Value Ref Range Status   Specimen Description BLOOD LEFT ANTECUBITAL  Final   Special Requests BOTTLES DRAWN AEROBIC AND ANAEROBIC 5CC EA  Final   Culture NO GROWTH 5 DAYS  Final   Report Status 10/09/2016 FINAL  Final  Culture, blood (routine x 2) Call MD if unable to obtain prior to antibiotics being given     Status: None   Collection Time: 10/03/16 10:00 PM  Result Value Ref Range Status   Specimen Description BLOOD RIGHT WRIST  Final   Special Requests BOTTLES DRAWN AEROBIC AND ANAEROBIC 5CC EA  Final   Culture NO GROWTH 5 DAYS  Final   Report Status 10/09/2016 FINAL  Final  Culture, Urine  Status: None   Collection Time: 10/04/16  1:23 AM  Result Value Ref Range Status   Specimen Description URINE, CATHETERIZED  Final   Special Requests NONE  Final   Culture NO GROWTH  Final   Report Status 10/05/2016 FINAL  Final  MRSA PCR Screening     Status: None   Collection Time: 10/04/16  1:53 AM  Result Value Ref Range Status   MRSA by PCR NEGATIVE NEGATIVE Final    Comment:        The GeneXpert MRSA Assay (FDA approved for NASAL specimens only), is one component of a comprehensive MRSA colonization surveillance program. It is not intended to diagnose MRSA infection nor to guide or monitor treatment for MRSA infections.          Radiology Studies: No results found.      Scheduled Meds: . budesonide  0.5 mg Nebulization BID  . dorzolamide  1 drop Left Eye BID  . FLUoxetine  20 mg Oral Daily  . furosemide  40 mg Oral Daily  . gabapentin  600 mg Oral QHS  . ipratropium  0.5 mg Nebulization TID  . levalbuterol  0.63 mg Nebulization TID  . levothyroxine  75 mcg Oral QAC breakfast  . mouth rinse  15 mL Mouth Rinse BID  . methocarbamol  500 mg Oral BID  . pantoprazole  40 mg Oral Daily  . pilocarpine  1 drop Left Eye BID  . sodium chloride flush  10-40 mL Intracatheter Q12H  . spironolactone  25 mg Oral Daily  .  timolol  1 drop Left Eye BID  . Travoprost (BAK Free)  1 drop Left Eye QHS  . warfarin  1 mg Oral ONCE-1800  . Warfarin - Pharmacist Dosing Inpatient   Does not apply q1800   Continuous Infusions: . heparin 1,000 Units/hr (10/13/16 0953)     LOS: 10 days    Time spent: 57 mins    Keston Seever, MD [facility] Pager [phone]  If 7PM-7AM, please contact night-coverage [email or web address] Password [email or web address] 10/13/2016, 1:27 PM

## 2016-10-14 ENCOUNTER — Inpatient Hospital Stay (HOSPITAL_COMMUNITY): Payer: Medicare Other

## 2016-10-14 DIAGNOSIS — N179 Acute kidney failure, unspecified: Secondary | ICD-10-CM

## 2016-10-14 LAB — PROTIME-INR
INR: 1.79
PROTHROMBIN TIME: 21 s — AB (ref 11.4–15.2)

## 2016-10-14 LAB — RENAL FUNCTION PANEL
ANION GAP: 9 (ref 5–15)
Albumin: 2.4 g/dL — ABNORMAL LOW (ref 3.5–5.0)
BUN: 11 mg/dL (ref 6–20)
CO2: 24 mmol/L (ref 22–32)
Calcium: 8.6 mg/dL — ABNORMAL LOW (ref 8.9–10.3)
Chloride: 102 mmol/L (ref 101–111)
Creatinine, Ser: 1.04 mg/dL — ABNORMAL HIGH (ref 0.44–1.00)
GFR calc Af Amer: 54 mL/min — ABNORMAL LOW (ref >60)
GFR calc non Af Amer: 47 mL/min — ABNORMAL LOW (ref >60)
GLUCOSE: 76 mg/dL (ref 65–99)
POTASSIUM: 3.8 mmol/L (ref 3.5–5.1)
Phosphorus: 3 mg/dL (ref 2.5–4.6)
Sodium: 135 mmol/L (ref 135–145)

## 2016-10-14 LAB — CBC
HCT: 29.8 % — ABNORMAL LOW (ref 36.0–46.0)
HEMOGLOBIN: 9.9 g/dL — AB (ref 12.0–15.0)
MCH: 31.3 pg (ref 26.0–34.0)
MCHC: 33.2 g/dL (ref 30.0–36.0)
MCV: 94.3 fL (ref 78.0–100.0)
PLATELETS: 183 10*3/uL (ref 150–400)
RBC: 3.16 MIL/uL — AB (ref 3.87–5.11)
RDW: 15.7 % — ABNORMAL HIGH (ref 11.5–15.5)
WBC: 8.1 10*3/uL (ref 4.0–10.5)

## 2016-10-14 LAB — MAGNESIUM: MAGNESIUM: 1.9 mg/dL (ref 1.7–2.4)

## 2016-10-14 LAB — HEPARIN LEVEL (UNFRACTIONATED): HEPARIN UNFRACTIONATED: 0.68 [IU]/mL (ref 0.30–0.70)

## 2016-10-14 LAB — BRAIN NATRIURETIC PEPTIDE: B Natriuretic Peptide: 281.9 pg/mL — ABNORMAL HIGH (ref 0.0–100.0)

## 2016-10-14 MED ORDER — OXYCODONE-ACETAMINOPHEN 5-325 MG PO TABS: 1.0000 | ORAL_TABLET | Freq: Three times a day (TID) | ORAL | 0 refills | Status: DC | PRN

## 2016-10-14 MED ORDER — SODIUM CHLORIDE 0.9 % IV BOLUS (SEPSIS)
500.0000 mL | Freq: Once | INTRAVENOUS | Status: AC
  Administered 2016-10-14: 500 mL via INTRAVENOUS

## 2016-10-14 MED ORDER — WARFARIN SODIUM 2 MG PO TABS
2.0000 mg | ORAL_TABLET | Freq: Once | ORAL | Status: AC
  Administered 2016-10-14: 2 mg via ORAL
  Filled 2016-10-14: qty 1

## 2016-10-14 MED ORDER — SODIUM CHLORIDE 0.9 % IV BOLUS (SEPSIS)
250.0000 mL | Freq: Once | INTRAVENOUS | Status: AC
  Administered 2016-10-14: 250 mL via INTRAVENOUS

## 2016-10-14 MED ORDER — METOPROLOL SUCCINATE ER 25 MG PO TB24: 12.5000 mg | ORAL_TABLET | Freq: Every day | ORAL | 0 refills | Status: DC

## 2016-10-14 MED ORDER — ENOXAPARIN SODIUM 60 MG/0.6ML ~~LOC~~ SOLN: 60.0000 mg | Freq: Two times a day (BID) | SUBCUTANEOUS | Status: DC

## 2016-10-14 NOTE — Clinical Social Work Note (Signed)
Clinical Social Work Assessment  Patient Details  Name: [name] MRN: [id number] Date of Birth: [birth year]  Date of referral:  10/14/16               Reason for consult:  Facility Placement                Permission sought to share information with:  Facility Sport and exercise psychologist, Family Supports Permission granted to share information::  Yes, Verbal Permission Granted  Name::     Materials engineer::  SNFs  Relationship::  Daughter  Contact Information:  [phone]  Housing/Transportation Living arrangements for the past 2 months:  [place] of Information:  Adult Children Patient Interpreter Needed:  None Criminal Activity/Legal Involvement Pertinent to Current Situation/Hospitalization:  No - Comment as needed Significant Relationships:  Adult Children Lives with:  Adult Children Do you feel safe going back to the place where you live?  No Need for family participation in patient care:  Yes (Comment)  Care giving concerns:  CSW received consult for possible SNF placement at time of discharge. CSW spoke with patient and patient's daughter regarding PT recommendation of SNF placement at time of discharge. Patient's daughter reported that patient lives with patient's son, who is currently also in the hospital. Patient expressed understanding of PT recommendation and is agreeable to SNF placement at time of discharge. CSW to continue to follow and assist with discharge planning needs.   Social Worker assessment / plan:  CSW spoke with patient concerning possibility of rehab at [facility] before returning home.  Employment status:  Retired Forensic scientist:  Medicare PT Recommendations:  [place] / Referral to community resources:  [place]  Patient/Family's Response to care:  Patient recognizes need for rehab before returning home and is agreeable to a SNF in [place]. Patient reported preference for [place].  Patient/Family's Understanding of and Emotional Response to Diagnosis, Current Treatment, and Prognosis:  Patient/family is realistic regarding therapy needs and expressed being hopeful for SNF placement. Patient expressed understanding of CSW role and discharge process. No questions/concerns about plan or treatment.    Emotional Assessment Appearance:  Appears stated age Attitude/Demeanor/Rapport:  Unable to Assess Affect (typically observed):  Accepting, Appropriate Orientation:  Oriented to Self, Oriented to Situation, Oriented to Place, Oriented to  Time Alcohol / Substance use:  Not Applicable Psych involvement (Current and /or in the community):  No (Comment)  Discharge Needs  Concerns to be addressed:  Care Coordination Readmission within the last 30 days:  No Current discharge risk:  Dependent with Mobility Barriers to Discharge:  Continued Medical Work up   [employer], [place] 10/14/2016, 9:26 AM

## 2016-10-14 NOTE — Progress Notes (Addendum)
ADDENDUM Patient may discharge today. Can resume home dose of Coumadin 1mg  daily at discharge. Order for 2mg  is still entered in case patient is not discharged- she does NOT need to receive this prior to discharge.  [place] for heparin and Coumadin  Indication: atrial fibrillation  No Known Allergies  Patient Measurements: Height: 5\' 7"  (170.2 cm) Weight: 132 lb 7.9 oz (60.1 kg) IBW/kg (Calculated) : 61.6  Vital Signs: Temp: 98.1 F (36.7 C) (02/28 0455) Temp Source: Oral (02/28 0455) BP: 90/58 (02/28 0455) Pulse Rate: 62 (02/28 0455)  Labs:  Recent Labs  10/12/16 0408 10/12/16 1436 10/13/16 0416 10/14/16 0416  HGB 10.5*  --  10.3* 9.9*  HCT 31.3*  --  30.5* 29.8*  PLT 185  --  166 183  LABPROT 18.1*  --  20.3* 21.0*  INR 1.49  --  1.71 1.79  HEPARINUNFRC 0.86* 0.25* 0.59 0.68  CREATININE 1.09*  --  1.05* 1.04*    Estimated Creatinine Clearance: 36.2 mL/min (by C-G formula based on SCr of 1.04 mg/dL (H)).  Assessment: 83 yo female admitted with acute hypoxic respiratory failure on Coumadin 1mg  daily PTA for afib. Transitioned to heparin infusion on admission and Coumadin held due to inability to tolerate PO meds. Coumadin resumed on 2/24 and remains on heparin infusion while INR is below goal.  Heparin level in range, though on high side, this morning at 0.68 units/mL. INR up to 1.79. Hgb low but stable, platelets are within normal limits- no bleeding noted.  Goal of Therapy:  INR 2-3 Heparin level 0.3-0.7 units/ml Monitor platelets by anticoagulation protocol: Yes   Plan:  -Decrease heparin infusion slightly to 950 units/hr  -Coumadin 2mg  po x1 tonight  -Daily heparin level, CBC and INR  Florabelle Cardin D. Sharica Roedel, PharmD, BCPS Clinical Pharmacist Pager: [phone] 10/14/2016 8:50 AM

## 2016-10-14 NOTE — Discharge Summary (Addendum)
Physician Discharge Summary  [name] [id number] DOB: 1926/10/12 DOA: 10/03/2016  PCP: Tamsen Roers, MD  Admit date: 10/03/2016 Discharge date: 10/15/2016  Admitted From: Home Disposition:  SNF  Recommendations for Outpatient Follow-up:  1. Follow up with PCP in 2-3 weeks 2. Follow up with Dr. Einar Gip as scheduled 3. Consider referral to SLP at facility 4. Please check INR in 1-2 days, when INR is 2-3, please stop lovenox bridge  Discharge Condition:Stable CODE STATUS:DNR Diet recommendation: Heart healthy   Brief/Interim Summary: 83 y.o. female with medical history significant for chronic atrial fibrillation on warfarin, COPD, chronic diastolic CHF, hypothyroidism, hypertension, and depression who now presents to the emergency department for evaluation of productive cough and generalized weakness. Patient had reportedly developed a productive cough and dyspnea approximately one month ago and was started on antibiotics at that time. Her symptoms failed to improve and she was recently started on a second course of antibiotics with Levaquin, in addition to prednisone. She completed the prednisone and finished her Levaquin today, but reports no improvement in her cough or dyspnea, and worsening in her generalized weakness and lethargy. Her daughter at the bedside reports that the patient is typically able to transfer from a power chair to bed or commode, but has been too weak to do this for the past 2 days. She has also had poor appetite in the last several days. There has been no known fevers or chills and the patient denies chest pain or palpitations. Patient's daughter believes that the patient may have been a little confused a couple points today, but the patient denies any headache, lightheadedness, change in vision or hearing, or focal numbness or weakness. In addition to the antibiotics and steroids, the patient is also been using home neb treatments, Mucinex, and Tessalon.   Upon  arrival to the ED, patient is found to be afebrile, saturating only 88% on room air, with blood pressure of 80/67, and vitals otherwise stable. EKG features atrial fibrillation with repolarization abnormality and QTc prolonged to 532 ms. Chest x-ray reveals a new air space consolidation in the lingula concerning for pneumonia. Chemistry panels notable for a potassium of 2.5 and creatinine 1.62, up from an apparent baseline of 0.8. CBC features a normocytic anemia with hemoglobin of 10.1, down from 11-12 last year. INR is slightly supratherapeutic at 3.23. Troponin is within the normal limits and BNP is elevated but stable at 459. Lactic acid is drawn but remains pending. Patient was given 40 mEq oral potassium, 30 mEq IV potassium, and empiric Rocephin and azithromycin. Blood pressure trended down in the emergency department and the patient was given 2 L of normal saline. Lactic acid remains pending at this time. Blood pressure remains soft, patient is mentating well, and respiratory status has improved in the ED. She will be admitted to the stepdown unit for ongoing evaluation and management of sepsis secondary to pneumonia.  #1 acute respiratory failure with hypoxia secondary to acute systolic heart failure and pleural effusions in the setting of restrictive lung disease. Patient initially admitted to critical care and initially being treated for probable pneumonia and COPD exacerbation. Chest x-ray consistent with volume overload. Patient with elevated BNP. 2-D echo which was done with a new cardiomyopathy with a EF of 30-35% with wall motion abnormalities. Patient with multiple comorbidities. Patient asking for aggressive treatment. Patient  Was on IV Lasix twice daily which have subsequently been held secondary to hypotension. Patient no longer volume overloaded.  Patient has been seen by cardiology  and her medications have been adjusted per cardiology. Cardiology following and appreciate input and  recommendations. Patient has been cleared for discharge to SNF  #2 hypokalemia Likely secondary to diuresis. Replaced  #3 atrial fibrillation CHA2DS2-VASC 5 Currently rate controlled.. Beta blocker has been on hold in order to allow fluid removal by critical care. Per cardiology may resume beta blocker on discharge it pressure tolerates. Patient had been on Coumadin. Continue on lovenox bridge.  #4 acute renal failure Secondary to acute systolic heart failure. Improved with diuresis. Diuretics on hold seLikely secondary to hypotension. Stable  #5 protein calorie malnutrition Patient has been assessed by speech therapy. Patient with some significant esophageal deficits and at increased risk for aspiration. Patient diet has been advanced to a soft diet/ regular diet that she is tolerating. Monitor.  #6 dysphagia Patient with some esophageal deficits. Patient with increased risk of aspiration. Speech therapy following and diet has been advanced to a regular diet. Follow.  #7 acute encephalopathy Intermittent.  Question polypharmacy  #8 hypothyroidism Changed to IV Synthroid to oral synthroid.  #9 neuropathy Resume home regimen gabapentin.  #10 chronic pain Resumed home regimen of Robaxin and Percocet as needed.  #11 hypotension Patient noted to be hypotensive  On 10/12/2016 and received  A few boluses of normal saline.IV diuetics were discontinued and antihypertensive medications held. Patient mentating well. Blood pressure improved and in sbp 90-100s. Cardiology following. Stable from Cardiology standpoint  Discharge Diagnoses:  Principal Problem:   Acute respiratory failure with hypoxia ([place]) Active Problems:   Atrial fibrillation (HCC)   Hypothyroidism   CAP (community acquired pneumonia)   AKI (acute kidney injury) ([place])   Hypokalemia   Normocytic anemia   Chronic diastolic CHF (congestive heart failure) (HCC)   Hypotension   COPD with acute exacerbation  (HCC)   Sepsis due to pneumonia [facility])   Acute encephalopathy   Acute systolic heart failure ([place])   Cardiomyopathy ([place])   Goals of care, counseling/discussion   Palliative care encounter   Encounter for hospice care discussion   Dysphagia   Respiratory distress    Discharge Instructions   Allergies as of 10/15/2016   No Known Allergies     Medication List    STOP taking these medications   levofloxacin 500 MG tablet Commonly known as:  LEVAQUIN   lisinopril 5 MG tablet Commonly known as:  PRINIVIL,ZESTRIL   metoprolol tartrate 25 MG tablet Commonly known as:  LOPRESSOR   spironolactone 25 MG tablet Commonly known as:  ALDACTONE     TAKE these medications   acetaminophen 325 MG tablet Commonly known as:  TYLENOL Take 325 mg by mouth daily as needed for headache (pain).   albuterol (2.5 MG/3ML) 0.083% nebulizer solution Commonly known as:  PROVENTIL Take 2.5 mg by nebulization every 4 (four) hours as needed for wheezing or shortness of breath.   benzonatate 100 MG capsule Commonly known as:  TESSALON Take 100 mg by mouth 3 (three) times daily.   budesonide 0.5 MG/2ML nebulizer solution Commonly known as:  PULMICORT Take 0.5 mg by nebulization 2 (two) times daily.   DELSYM 30 MG/5ML liquid Generic drug:  dextromethorphan Take 30 mg by mouth every 12 (twelve) hours as needed for cough.   dorzolamide 2 % ophthalmic solution Commonly known as:  TRUSOPT Place 1 drop into the left eye 2 (two) times daily.   enoxaparin 60 MG/0.6ML injection Commonly known as:  LOVENOX Inject 0.6 mLs (60 mg total) into the skin every 12 (twelve)  hours.   ergocalciferol 50000 units capsule Commonly known as:  VITAMIN D2 Take 50,000 Units by mouth every Thursday.   FLUoxetine 20 MG capsule Commonly known as:  PROZAC Take 20 mg by mouth daily.   furosemide 20 MG tablet Commonly known as:  LASIX Take 1 tablet (20 mg total) by mouth 2 (two) times daily. What changed:  how  much to take  when to take this  additional instructions   gabapentin 600 MG tablet Commonly known as:  NEURONTIN Take 600 mg by mouth at bedtime.   levothyroxine 75 MCG tablet Commonly known as:  SYNTHROID, LEVOTHROID Take 75 mcg by mouth daily before breakfast.   loperamide 2 MG capsule Commonly known as:  IMODIUM Take 2-4 mg by mouth See admin instructions. Take 2 capsules (4 mg) by mouth every morning, may also take 1 capsule (2 mg) during the day as needed for loose stools   LUTEIN-ZEAXANTHIN PO Take 1 tablet by mouth daily with supper.   methocarbamol 500 MG tablet Commonly known as:  ROBAXIN Take 500 mg by mouth 2 (two) times daily.   metoprolol succinate 25 MG 24 hr tablet Commonly known as:  TOPROL-XL Take 0.5 tablets (12.5 mg total) by mouth daily.   MUCINEX DM PO Take 10 mLs by mouth every 12 (twelve) hours.   omeprazole 40 MG capsule Commonly known as:  PRILOSEC Take 40 mg by mouth daily.   ondansetron 4 MG disintegrating tablet Commonly known as:  ZOFRAN-ODT Take 4 mg by mouth every 8 (eight) hours as needed for nausea or vomiting.   oxyCODONE-acetaminophen 5-325 MG tablet Commonly known as:  PERCOCET/ROXICET Take 1-2 tablets by mouth every 4 (four) hours as needed for pain. What changed:  how much to take  when to take this  additional instructions   oxyCODONE-acetaminophen 5-325 MG tablet Commonly known as:  PERCOCET/ROXICET Take 1 tablet by mouth every 8 (eight) hours as needed for moderate pain. What changed:  You were already taking a medication with the same name, and this prescription was added. Make sure you understand how and when to take each.   pilocarpine 4 % ophthalmic solution Commonly known as:  PILOCAR Place 1 drop into the left eye 2 (two) times daily.   potassium chloride 10 MEQ tablet Commonly known as:  K-DUR Take 1 tablet (10 mEq total) by mouth 2 (two) times daily.   timolol 0.5 % ophthalmic solution Commonly known  as:  TIMOPTIC Place 1 drop into the left eye 2 (two) times daily.   travoprost (benzalkonium) 0.004 % ophthalmic solution Commonly known as:  TRAVATAN Place 1 drop into the left eye at bedtime.   warfarin 2 MG tablet Commonly known as:  COUMADIN Take 1 mg by mouth daily with supper.      Follow-up Information    Miquel Dunn, NP Follow up.   Specialty:  Cardiology Why:  [id number] N. [address]  [place] [address]. I 15 minutes early,appointment time 1:15 PM Contact information: [place] [id number] [phone]          No Known Allergies  Consultations:  Cardiology  Procedures/Studies: Dg Chest 2 View  Result Date: 10/03/2016 CLINICAL DATA:  83 year old female with history productive cough and increasing weakness. EXAM: CHEST  2 VIEW COMPARISON:  Chest x-ray 04/13/2017. FINDINGS: Increasing opacity in the lingula concerning for airspace consolidation. Small left pleural effusion. Right lung appears clear. No right pleural effusion. No pneumothorax. No definite  suspicious appearing pulmonary nodules or masses. No evidence of pulmonary edema. Heart size is mildly enlarged. Upper mediastinal contours are within normal limits. Atherosclerosis in the thoracic aorta. Old compression fracture of a lower thoracic vertebral body (likely T11) with 90% loss of anterior vertebral body height, similar to the prior examination, resulting in an acute kyphotic deformity in the lower thoracic spine. IMPRESSION: 1. New airspace consolidation in the lingula with small left-sided pleural effusion, concerning for lingular pneumonia. Followup PA and lateral chest X-ray is recommended in 3-4 weeks following trial of antibiotic therapy to ensure resolution and exclude underlying malignancy. 2. Aortic atherosclerosis. 3. Mild cardiomegaly. Electronically Signed   By: Vinnie Langton M.D.   On: 10/03/2016 20:50   Dg Chest 2 View  Result Date:  09/24/2016 CLINICAL DATA:  Shortness of breath with cough and congestion EXAM: CHEST  2 VIEW COMPARISON:  July 05, 2016 FINDINGS: There is a small pleural effusion on the left. There is scarring in the left base region. Lungs elsewhere clear. Heart is upper normal in size with pulmonary vascularity within normal limits. No adenopathy. There is aortic atherosclerosis. There are anterior wedge fractures in the mid and lower thoracic spine with increase in kyphosis, stable. IMPRESSION: Small left pleural effusion not present on prior study. Scarring left base which appears stable. Lungs elsewhere clear. Stable cardiac silhouette. Aortic atherosclerosis. Stable wedge fractures in the mid and lower thoracic regions. Electronically Signed   By: Lowella Grip III M.D.   On: 09/24/2016 12:37   Dg Chest Port 1 View  Result Date: 10/14/2016 CLINICAL DATA:  Pneumonia, history atrial fibrillation, COPD EXAM: PORTABLE CHEST 1 VIEW COMPARISON:  Portable exam 1531 hours compared 10/08/2016 FINDINGS: RIGHT arm PICC line tip projecting over the heart though patient is markedly kyphotic and rotated, uncertain position. Enlargement of cardiac silhouette. Bibasilar lung opacities which may represent atelectasis or infiltrate in combination with bibasilar effusions. Upper lungs clear. No gross evidence of pneumothorax. Diffuse osseous demineralization. IMPRESSION: Enlargement of cardiac silhouette. Bibasilar opacities likely representing a combination of pleural effusions with atelectasis versus infiltrate in the lower lungs. Electronically Signed   By: Lavonia Dana M.D.   On: 10/14/2016 15:53   Dg Chest Port 1 View  Result Date: 10/08/2016 CLINICAL DATA:  CHF, community-acquired pneumonia, acute respiratory failure, COPD. EXAM: PORTABLE CHEST 1 VIEW COMPARISON:  Portable chest x-ray of October 07, 2016 FINDINGS: The lungs are reasonably well inflated. There are bilateral pleural effusions obscuring the hemidiaphragms.  The interstitial markings remain increased. The cardiac silhouette remains enlarged. The pulmonary vascularity is indistinct. There is calcification in the wall of the aortic arch. The right-sided PICC line tip projects over the cavoatrial junction. IMPRESSION: Allowing for differences in positioning there has not been significant interval change. Mild pulmonary interstitial edema. Bibasilar atelectasis or pneumonia with bilateral pleural effusions. Thoracic aortic atherosclerosis. Electronically Signed   By: David  Martinique M.D.   On: 10/08/2016 07:10   Dg Chest Port 1 View  Result Date: 10/07/2016 CLINICAL DATA:  Shortness of breath and respiratory distress. No complaints voice today. EXAM: PORTABLE CHEST 1 VIEW COMPARISON:  Portable chest x-ray of October 05, 2016 FINDINGS: Since the previous study the pulmonary interstitial markings have decreased. The retrocardiac region on the left is less dense but the hemidiaphragm remains obscured. Patchy density at the right lung base persists. The cardiac silhouette remains enlarged. The pulmonary vascularity is less engorged. There is calcification in the wall of the aortic arch. The bony thorax exhibits no  acute abnormality. IMPRESSION: Improving pulmonary interstitial edema bilaterally. Improving left lower lobe atelectasis or pneumonia. Persistent subsegmental atelectasis at the right lung base. Probable small right and moderate-sized left pleural effusion. Thoracic aortic atherosclerosis. Electronically Signed   By: David  Martinique M.D.   On: 10/07/2016 12:33   Dg Chest Port 1 View  Result Date: 10/05/2016 CLINICAL DATA:  83 y/o  F; follow-up of pneumonia. EXAM: PORTABLE CHEST 1 VIEW COMPARISON:  10/04/2016 chest radiograph FINDINGS: Stable left basilar opacity and worsening opacification in the right lung base. Probable moderate left and small right pleural effusions. Cardiac silhouette is largely obscured by left basilar opacification. Aortic atherosclerosis  with calcification. No acute osseous abnormality is evident. Right PICC line tip projects over right atrium. IMPRESSION: Stable left and worsening right basilar opacities. Probable moderate left and small right pleural effusions. Electronically Signed   By: Kristine Garbe M.D.   On: 10/05/2016 18:19   Dg Chest Port 1 View  Result Date: 10/04/2016 CLINICAL DATA:  Dyspnea EXAM: PORTABLE CHEST 1 VIEW COMPARISON:  10/04/2016, earlier the same day. FINDINGS: The cardio pericardial silhouette is enlarged. Left base collapse/consolidation with effusion again noted. More modest atelectasis/ infiltrate noted at right base with probable tiny right pleural effusion. Right PICC line with tip difficult to visualize but overlies the mid to lower right atrium. This should be pulled back 3-4 cm for more appropriate positioning in the region of the SVC/RA junction. Telemetry leads overlie the chest. IMPRESSION: 1. Right PICC line tip in the mid to lower right atrium. This should be pulled back for more appropriate positioning. 2. Stable exam with left greater than right collapse/consolidation and bilateral effusions. Electronically Signed   By: Misty Stanley M.D.   On: 10/04/2016 23:37   Dg Chest Port 1 View  Result Date: 10/04/2016 CLINICAL DATA:  Central catheter placement EXAM: PORTABLE CHEST 1 VIEW COMPARISON:  October 03, 2016 FINDINGS: Central catheter tip is at cavoatrial junction. No pneumothorax. There is airspace consolidation with effusion in the left base. There is patchy new consolidation with small effusion in the right base laterally. There is cardiomegaly. The pulmonary vascularity is normal. There is atherosclerotic calcification in the aorta. No evident adenopathy. No bone lesions. IMPRESSION: Central catheter tip at cavoatrial junction.  No pneumothorax. Persistent airspace consolidation left lower lobe with left pleural effusion. New focal airspace consolidation lateral right base with small  right pleural effusion. Stable cardiomegaly. Aortic atherosclerosis noted. Electronically Signed   By: Lowella Grip III M.D.   On: 10/04/2016 16:02   Dg Swallowing Func-speech Pathology  Result Date: 10/04/2016 Objective Swallowing Evaluation: Type of Study: MBS-Modified Barium Swallow Study Patient Details Name: RICKIYA MAGLIO MRN: [id number] Date of Birth: June 05, 1927 Today's Date: 10/04/2016 Time: SLP Start Time (ACUTE ONLY): 1050-SLP Stop Time (ACUTE ONLY): 1115 SLP Time Calculation (min) (ACUTE ONLY): 25 min Past Medical History: Past Medical History: Diagnosis Date . Atrial fibrillation ([place])  . COPD (chronic obstructive pulmonary disease) ([place])  . Fall  . Glaucoma  . Hypothyroidism  . Neuropathy ([facility])   PERIPHERAL . Osteoarthritis  . Osteoporosis  . Trimalleolar fracture   LEFT TRIMALLEOLAR FRACTURE, DISLOCATION WITH OBLIQUE FRACTURE OF THE DISTAL FIBULAR DIAPHYSIS, AND NOTED BEING MILDLY COMMINUTED Past Surgical History: Past Surgical History: Procedure Laterality Date . ORIF TIBIA & FIBULA FRACTURES   . VAGINAL HYSTERECTOMY   HPI: Mayeli Sundara Bowmanis a 83 y.o.femalewith medical history significant forchronic atrial fibrillation on warfarin, COPD, chronic diastolic CHF, hypothyroidism, hypertension, and depression who now  presents to the emergency department for evaluation of productive cough and generalized weakness. Patient had reportedly developed a productive cough and dyspnea approximately one month ago and was started on antibiotics at that time. Her symptoms failed to improve and she was recently started on a second course of antibiotics with Levaquin, in addition to prednisone. She completed the prednisone and finished her Levaquin today, but reports no improvement in her cough or dyspnea, and worsening in her generalized weakness and lethargy. Chest x-ray reveals a new air space consolidation in the lingula concerning for pneumonia. Respiratory status improved in the ED, patient on [place].  Admitted to the stepdown unit for ongoing evaluation and management of sepsis secondary to pneumonia. No prior history of dysphagia per chart. Referred by MD for swallowing evaluation due to coughing with fluids, recurrent pneumonia.  Subjective: "i can't eat" Assessment / Plan / Recommendation CHL IP CLINICAL IMPRESSIONS 10/04/2016 Therapy Diagnosis WFL;Suspected primary esophageal dysphagia Clinical Impression Patient presents with suspected primary esophageal dysphagia. Oropharyngeal swallow which appears grossly within functional limits in keeping with normative age-related changes in swallowing. One instance of frank aspiration of thin liquids noted which occured before the swallow when patient coughed which appeared to be in response to solids sticking in the upper esophagus. Swallow is delayed to the level of the pyriform sinuses with thin liquids, with intermittent transient penetration which clears with the swallow. Rotary mastication and oral manipulation mildly impaired with solids, secondary to ill-fitting dentures. Swallow is timely with no abnormal residue for pureed and regular solids. An esophageal sweep revealed bolus stasis in the upper esophagus. Radiologist was consulted who confirmed severely reduced motility, and reflux, backflow of bolus into the pharynx. Following the assessment, patient with continued coughing and suspected reflux of barium, which was suctioned by RN from the oral cavity. Given the above findings, patient is at severe risk for aspiration of backflow, gastric contents. Recommended to RN for patient to be kept upright to reduce risk of aspirating reflux of barium. Recommend NPO with ice chips for comfort following oral care pending GI consult. Given initial tolerance of thin liquids prior to administering solids, patient may be able to tolerate a liquid diet; will defer to GI and follow up with patient for education and to determine safest, least restrictive diet for adequate  nutrition/hydration and for quality of life.  Impact on safety and function Severe aspiration risk   CHL IP TREATMENT RECOMMENDATION 10/04/2016 Treatment Recommendations Therapy as outlined in treatment plan below   Prognosis 10/04/2016 Prognosis for Safe Diet Advancement Fair Barriers to Reach Goals Severity of deficits Barriers/Prognosis Comment -- CHL IP DIET RECOMMENDATION 10/04/2016 SLP Diet Recommendations NPO;Ice chips PRN after oral care Liquid Administration via -- Medication Administration Via alternative means Compensations -- Postural Changes --   CHL IP OTHER RECOMMENDATIONS 10/04/2016 Recommended Consults Consider GI evaluation;Consider esophageal assessment Oral Care Recommendations Oral care QID Other Recommendations --   CHL IP FOLLOW UP RECOMMENDATIONS 10/04/2016 Follow up Recommendations Other (comment)   CHL IP FREQUENCY AND DURATION 10/04/2016 Speech Therapy Frequency (ACUTE ONLY) min 2x/week Treatment Duration 2 weeks      CHL IP ORAL PHASE 10/04/2016 Oral Phase Impaired Oral - Pudding Teaspoon -- Oral - Pudding Cup -- Oral - Honey Teaspoon -- Oral - Honey Cup -- Oral - Nectar Teaspoon -- Oral - Nectar Cup -- Oral - Nectar Straw -- Oral - Thin Teaspoon -- Oral - Thin Cup -- Oral - Thin Straw -- Oral - Puree -- Oral - Mech  Soft -- Oral - Regular Impaired mastication;Lingual pumping Oral - Multi-Consistency -- Oral - Pill -- Oral Phase - Comment --  CHL IP PHARYNGEAL PHASE 10/04/2016 Pharyngeal Phase Impaired Pharyngeal- Pudding Teaspoon -- Pharyngeal -- Pharyngeal- Pudding Cup -- Pharyngeal -- Pharyngeal- Honey Teaspoon -- Pharyngeal -- Pharyngeal- Honey Cup -- Pharyngeal -- Pharyngeal- Nectar Teaspoon -- Pharyngeal -- Pharyngeal- Nectar Cup -- Pharyngeal -- Pharyngeal- Nectar Straw -- Pharyngeal -- Pharyngeal- Thin Teaspoon -- Pharyngeal -- Pharyngeal- Thin Cup Delayed swallow initiation-pyriform sinuses;Penetration/Aspiration during swallow;Penetration/Aspiration before swallow;Trace aspiration  Pharyngeal Material enters airway, passes BELOW cords without attempt by patient to eject out (silent aspiration);Material enters airway, CONTACTS cords and then ejected out Pharyngeal- Thin Straw -- Pharyngeal -- Pharyngeal- Puree WFL Pharyngeal -- Pharyngeal- Mechanical Soft -- Pharyngeal -- Pharyngeal- Regular WFL Pharyngeal -- Pharyngeal- Multi-consistency WFL Pharyngeal -- Pharyngeal- Pill -- Pharyngeal -- Pharyngeal Comment --  CHL IP CERVICAL ESOPHAGEAL PHASE 10/04/2016 Cervical Esophageal Phase Impaired Pudding Teaspoon -- Pudding Cup -- Honey Teaspoon -- Honey Cup -- Nectar Teaspoon -- Nectar Cup -- Nectar Straw -- Thin Teaspoon -- Thin Cup Esophageal backflow into the pharynx;Esophageal backflow into the larynx Thin Straw -- Puree Other (Comment) Mechanical Soft -- Regular Other (Comment) Multi-consistency -- Pill -- Cervical Esophageal Comment -- No flowsheet data found. Aliene Altes 10/04/2016, 12:31 PM    Deneise Lever, MS CF-SLP Speech-Language Pathologist [phone]           Subjective: Without complaints  Discharge Exam: Vitals:   10/14/16 2321 10/15/16 0422  BP: 101/63 (!) 105/53  Pulse: 78 72  Resp:  16  Temp:  97.7 F (36.5 C)   Vitals:   10/14/16 2321 10/15/16 0422 10/15/16 0500 10/15/16 0829  BP: 101/63 (!) 105/53    Pulse: 78 72    Resp:  16    Temp:  97.7 F (36.5 C)    TempSrc:  Oral    SpO2:  96%  97%  Weight:   59.3 kg (130 lb 11.7 oz)   Height:        General: Pt is alert, awake, not in acute distress Cardiovascular: RRR, S1/S2 +, no rubs, no gallops Respiratory: CTA bilaterally, no wheezing, no rhonchi Abdominal: Soft, NT, ND, bowel sounds + Extremities: no edema, no cyanosis   The results of significant diagnostics from this hospitalization (including imaging, microbiology, ancillary and laboratory) are listed below for reference.     Microbiology: No results found for this or any previous visit (from the past 240 hour(s)).   Labs: BNP (last 3  results)  Recent Labs  10/08/16 0330 10/14/16 0416 10/15/16 0439  BNP 2,042.8* 281.9* [id number]   Basic Metabolic Panel:  Recent Labs Lab 10/11/16 0325 10/12/16 0408 10/13/16 0416 10/14/16 0416 10/15/16 0439  NA 136 133* 134* 135 137  136  K 3.6 3.4* 4.1 3.8 3.6  3.6  CL 95* 94* 98* 102 107  106  CO2 31 31 27 24 25  25   GLUCOSE 80 88 83 76 87  86  BUN 13 13 13 11 9  9   CREATININE 1.02* 1.09* 1.05* 1.04* 1.10*  1.10*  CALCIUM 9.0 8.9 8.9 8.6* 8.7*  8.7*  MG 1.9 1.9 2.0 1.9 2.1  PHOS 3.3 3.7 3.2 3.0 3.3   Liver Function Tests:  Recent Labs Lab 10/11/16 0325 10/12/16 0408 10/13/16 0416 10/14/16 0416 10/15/16 0439  ALBUMIN 2.5* 2.6* 2.4* 2.4* 2.6*   No results for input(s): LIPASE, AMYLASE in the last 168 hours. No results for input(s):  AMMONIA in the last 168 hours. CBC:  Recent Labs Lab 10/11/16 0325 10/12/16 0408 10/13/16 0416 10/14/16 0416 10/15/16 0439  WBC 7.4 7.1 7.0 8.1 7.4  HGB 10.1* 10.5* 10.3* 9.9* 9.9*  HCT 30.4* 31.3* 30.5* 29.8* 31.0*  MCV 93.8 93.2 93.8 94.3 94.5  PLT 157 185 166 183 196   Cardiac Enzymes: No results for input(s): CKTOTAL, CKMB, CKMBINDEX, TROPONINI in the last 168 hours. BNP: Invalid input(s): POCBNP CBG: No results for input(s): GLUCAP in the last 168 hours. D-Dimer No results for input(s): DDIMER in the last 72 hours. Hgb A1c No results for input(s): HGBA1C in the last 72 hours. Lipid Profile No results for input(s): CHOL, HDL, LDLCALC, TRIG, CHOLHDL, LDLDIRECT in the last 72 hours. Thyroid function studies No results for input(s): TSH, T4TOTAL, T3FREE, THYROIDAB in the last 72 hours.  Invalid input(s): FREET3 Anemia work up No results for input(s): VITAMINB12, FOLATE, FERRITIN, TIBC, IRON, RETICCTPCT in the last 72 hours. Urinalysis    Component Value Date/Time   COLORURINE YELLOW 10/04/2016 0123   APPEARANCEUR CLEAR 10/04/2016 0123   LABSPEC 1.017 10/04/2016 0123   PHURINE 6.0 10/04/2016 0123    GLUCOSEU NEGATIVE 10/04/2016 0123   HGBUR NEGATIVE 10/04/2016 0123   BILIRUBINUR NEGATIVE 10/04/2016 0123   KETONESUR NEGATIVE 10/04/2016 0123   PROTEINUR NEGATIVE 10/04/2016 0123   UROBILINOGEN 1.0 11/22/2010 0949   NITRITE NEGATIVE 10/04/2016 0123   LEUKOCYTESUR NEGATIVE 10/04/2016 0123   Sepsis Labs Invalid input(s): PROCALCITONIN,  WBC,  LACTICIDVEN Microbiology No results found for this or any previous visit (from the past 240 hour(s)).   SIGNED:   Donne Hazel, MD  [facility] 10/15/2016, 3:04 PM  If 7PM-7AM, please contact night-coverage [email or web address] Password [email or web address]

## 2016-10-14 NOTE — Consult Note (Signed)
   [facility] CM Inpatient Consult   10/14/2016  [name] 08/01/1927 [id number]  Patient screened for [place] Management services.  Admitted with shortness of breath with periods of intermittent confusion per notes. Patient is currently being considered for skilled nursing at discharge.  For questions, please contact:  Natividad Brood, RN BSN [facility] Liaison  [phone] business mobile phone Toll free office [phone]

## 2016-10-14 NOTE — Clinical Social Work Placement (Signed)
   CLINICAL SOCIAL WORK PLACEMENT  NOTE  Date:  10/14/2016  Patient Details  Name: BEILY BUSCEMI MRN: [id number] Date of Birth: 20-Jul-1927  Clinical Social Work is seeking post-discharge placement for this patient at the [place] level of care (*CSW will initial, date and re-position this form in  chart as items are completed):  Yes   Patient/family provided with [place] Work Department's list of facilities offering this level of care within the geographic area requested by the patient (or if unable, by the patient's family).  Yes   Patient/family informed of their freedom to choose among providers that offer the needed level of care, that participate in Medicare, Medicaid or managed care program needed by the patient, have an available bed and are willing to accept the patient.  Yes   Patient/family informed of [facility]'s ownership interest in [facility] and [facility], as well as of the fact that they are under no obligation to receive care at these facilities.  PASRR submitted to EDS on 10/14/16     PASRR number received on 10/14/16     Existing PASRR number confirmed on       FL2 transmitted to all facilities in geographic area requested by pt/family on 10/14/16     FL2 transmitted to all facilities within larger geographic area on       Patient informed that his/her managed care company has contracts with or will negotiate with certain facilities, including the following:        Yes   Patient/family informed of bed offers received.  Patient chooses bed at       Physician recommends and patient chooses bed at      Patient to be transferred to   on  .  Patient to be transferred to facility by       Patient family notified on   of transfer.  Name of family member notified:        PHYSICIAN Please sign FL2     Additional Comment:    _______________________________________________ Benard Halsted, [place] 10/14/2016,  9:29 AM

## 2016-10-14 NOTE — Treatment Plan (Signed)
D/c held for today as pt seems more confused. Have ordered UA and cxr to r/u acute infection

## 2016-10-14 NOTE — Consult Note (Signed)
[facility] CM Primary Care Navigator  10/14/2016  [name] 12/23/1926 [id number]   Met with patient and daughter [name]) at the bedside to identify possible discharge needs. Patient seemed fidgety, restless and confused. Daughter reports that patient had worsening cough and difficulty breathing which had led to this admission.  Patient's daughter endorses Dr. Tamsen Roers with [facility] as the primary care provider.    Patient's daughter states using [facility] in [place] to obtain medications without any problem.   Patient's family (son- [name], daughter- [name] and granddaughter- [name]) manages medications for her at home using "pill box" and "notebook list" system.   Daughter or granddaughter provides transportation to her doctors' appointments.  Patient lives with son, who is currently in the hospital as well. Other family members take turns in providing care for patient at home as stated.  Discharge plan is skilled nursing facility (prefers [facility]) for short term rehabilitation before returning back home according to daughter.  Patient's daughter voiced understanding to call primary care provider's office when patient returns back home, for a post discharge follow-up appointment within a week or sooner if needs arise. Patient letter (with PCP's contact number) was provided as a reminder.  Discussed with daughter/patient regarding [facility] care management services available for health management and daughter had expressed interest onEMMI calls to help manage PNA, HF and COPDat home. Since patient will be discharged to skilled nursing facility, instructions provided for daughter to call [facility] care management for self referral to [place] after patient will be discharged from SNF. [facility] care management contact information provided and she agreed.   For additional questions please contact:  Edwena Felty A. Eilish Mcdaniel, BSN,  RN-BC [facility] PRIMARY CARE Navigator Cell: [phone]

## 2016-10-14 NOTE — Progress Notes (Signed)
Subjective:  He is concerned about her son who is in the hospital. Otherwise no other specific complaints. Denies any fever or cough. Denies dyspnea.  Objective:  Vital Signs in the last 24 hours: Temp:  [97.5 F (36.4 C)-98.4 F (36.9 C)] 98.1 F (36.7 C) (02/28 0455) Pulse Rate:  [62-88] 62 (02/28 0455) Resp:  [16-19] 17 (02/28 0455) BP: (89-107)/(54-71) 90/58 (02/28 0455) SpO2:  [92 %-98 %] 96 % (02/28 0807) Weight:  [132 lb 7.9 oz (60.1 kg)] 132 lb 7.9 oz (60.1 kg) (02/28 0455)  Intake/Output from previous day: 02/27 0701 - 02/28 0700 In: 593 [P.O.:360; I.V.:233] Out: -   Physical Exam: Blood pressure (!) 90/58, pulse 62, temperature 98.1 F (36.7 C), temperature source Oral, resp. rate 17, height 5\' 7"  (1.702 m), weight 132 lb 7.9 oz (60.1 kg), SpO2 96 %.  General appearance: alert, cooperative, appears stated age and no distress Lungs: Bilateral coarse basilar crackles left greater than the right Chest wall: no tenderness Heart: irregularly irregular rhythm, no S3 or S4, no rub and no murmur Abdomen: soft, non-tender; bowel sounds normal; no masses,  no organomegaly Extremities: extremities normal, atraumatic, no cyanosis or edema Pulses: bilateral carotid no bruit, femoral pulses normal, popliteal pulse one s bilateral.  Pedal pulses not felt.  Capillary refill normal. Neurologic: Gait: Not tested.  Reflexes not tested.  Patient is alert and oriented 3.  Lab Results: BMP  Recent Labs  10/12/16 0408 10/13/16 0416 10/14/16 0416  NA 133* 134* 135  K 3.4* 4.1 3.8  CL 94* 98* 102  CO2 31 27 24   GLUCOSE 88 83 76  BUN 13 13 11   CREATININE 1.09* 1.05* 1.04*  CALCIUM 8.9 8.9 8.6*  GFRNONAA 44* 46* 47*  GFRAA 51* 54* 54*    CBC  Recent Labs Lab 10/14/16 0416  WBC 8.1  RBC 3.16*  HGB 9.9*  HCT 29.8*  PLT 183  MCV 94.3  MCH 31.3  MCHC 33.2  RDW 15.7*    Recent Labs  07/01/16 1038 07/05/16 1240  TROPONINI <0.03 <0.03    Recent Labs   10/04/16 0548  TSH 3.249   Recent Labs  10/05/16 0610  10/12/16 0408 10/13/16 0416 10/14/16 0416  PROT 5.1*  --   --   --   --   ALBUMIN 2.6*  < > 2.6* 2.4* 2.4*  AST 60*  --   --   --   --   ALT 38  --   --   --   --   ALKPHOS 66  --   --   --   --   BILITOT 0.9  --   --   --   --   < > = values in this interval not displayed.  Imaging: Imaging results have been reviewed  Cardiac Studies: EKG: atrial fibrillation with controlled ventricular response at the rate of 90 bpm, normal axis.  Nonspecific T abnormality..  2D ECHO Impressions: - The patient was in atrial fibrillation. EF 30-35% with somewhat unusual wall motion abnormality pattern =>basal to mid anteroseptal, basal to mid inferoseptal, and basal to mid inferior akinesis. Mildly dilated RV with mildly decreased systolic function. Moderate aortic insufficiency. Mild mitral regurgitation. Severe biatrial enlargement. Mild pulmonary hypertension.   Assessment/Plan:  1. Acute on chronic systolic and diastolic heart failure, EF 30-35%. Not a candidate for further ischemic workup. 2. Chronic atrial fibrillation, rate controlled. No further bradycardia. Tolerating low dose beta blocker. I'm willing to accept 90 mmHg systolic  pressure. Patient is not numb lately, is wheelchair-bound at home but gets around well independently. CHADS2VASC score 5, presently on Coumadin and INR is increasing appropriately. 3. Chronic stage III kidney disease, stable 4. Anemia, hypoproteinemia and malnutrition.  Recommendation: We will obtain a BNP as a baseline today and also tomorrow. I suspect she has significant atelectasis in the bilateral basal lungs today, encourage spirometry. Will set her up for meals on the chair. She should be stable to be discharged to SNF. I have already made arrangements for her to be seen by me in about 10 days in the office. Discussed with hospitalist. We'll continue low-dose of furosemide that she  was on previously at 20 mg daily, hold off on the spinal act on for now and continue low-dose beta blocker. I will restart Spironolactone in the outpatient basis. Adrian Prows, M.D. 10/14/2016, 8:53 AM [facility] Pager: [phone] Office: [phone] If no answer: [phone]

## 2016-10-15 DIAGNOSIS — J432 Centrilobular emphysema: Secondary | ICD-10-CM | POA: Diagnosis not present

## 2016-10-15 DIAGNOSIS — K224 Dyskinesia of esophagus: Secondary | ICD-10-CM | POA: Diagnosis not present

## 2016-10-15 DIAGNOSIS — M545 Low back pain: Secondary | ICD-10-CM | POA: Diagnosis not present

## 2016-10-15 DIAGNOSIS — J189 Pneumonia, unspecified organism: Secondary | ICD-10-CM | POA: Diagnosis not present

## 2016-10-15 DIAGNOSIS — R278 Other lack of coordination: Secondary | ICD-10-CM | POA: Diagnosis not present

## 2016-10-15 DIAGNOSIS — Z961 Presence of intraocular lens: Secondary | ICD-10-CM | POA: Diagnosis not present

## 2016-10-15 DIAGNOSIS — R2689 Other abnormalities of gait and mobility: Secondary | ICD-10-CM | POA: Diagnosis not present

## 2016-10-15 DIAGNOSIS — J9601 Acute respiratory failure with hypoxia: Secondary | ICD-10-CM | POA: Diagnosis not present

## 2016-10-15 DIAGNOSIS — R1310 Dysphagia, unspecified: Secondary | ICD-10-CM | POA: Diagnosis not present

## 2016-10-15 DIAGNOSIS — H401133 Primary open-angle glaucoma, bilateral, severe stage: Secondary | ICD-10-CM | POA: Diagnosis not present

## 2016-10-15 DIAGNOSIS — G9340 Encephalopathy, unspecified: Secondary | ICD-10-CM | POA: Diagnosis not present

## 2016-10-15 DIAGNOSIS — I482 Chronic atrial fibrillation: Secondary | ICD-10-CM | POA: Diagnosis not present

## 2016-10-15 DIAGNOSIS — E440 Moderate protein-calorie malnutrition: Secondary | ICD-10-CM | POA: Diagnosis not present

## 2016-10-15 DIAGNOSIS — R1314 Dysphagia, pharyngoesophageal phase: Secondary | ICD-10-CM | POA: Diagnosis not present

## 2016-10-15 DIAGNOSIS — N179 Acute kidney failure, unspecified: Secondary | ICD-10-CM | POA: Diagnosis not present

## 2016-10-15 DIAGNOSIS — K222 Esophageal obstruction: Secondary | ICD-10-CM | POA: Diagnosis not present

## 2016-10-15 DIAGNOSIS — H353130 Nonexudative age-related macular degeneration, bilateral, stage unspecified: Secondary | ICD-10-CM | POA: Diagnosis not present

## 2016-10-15 DIAGNOSIS — I5042 Chronic combined systolic (congestive) and diastolic (congestive) heart failure: Secondary | ICD-10-CM | POA: Diagnosis not present

## 2016-10-15 DIAGNOSIS — A419 Sepsis, unspecified organism: Secondary | ICD-10-CM | POA: Diagnosis not present

## 2016-10-15 DIAGNOSIS — J9600 Acute respiratory failure, unspecified whether with hypoxia or hypercapnia: Secondary | ICD-10-CM | POA: Diagnosis not present

## 2016-10-15 DIAGNOSIS — I480 Paroxysmal atrial fibrillation: Secondary | ICD-10-CM | POA: Diagnosis not present

## 2016-10-15 DIAGNOSIS — M6281 Muscle weakness (generalized): Secondary | ICD-10-CM | POA: Diagnosis not present

## 2016-10-15 DIAGNOSIS — R41841 Cognitive communication deficit: Secondary | ICD-10-CM | POA: Diagnosis not present

## 2016-10-15 DIAGNOSIS — R531 Weakness: Secondary | ICD-10-CM | POA: Diagnosis not present

## 2016-10-15 DIAGNOSIS — J181 Lobar pneumonia, unspecified organism: Secondary | ICD-10-CM | POA: Diagnosis not present

## 2016-10-15 DIAGNOSIS — R0602 Shortness of breath: Secondary | ICD-10-CM | POA: Diagnosis not present

## 2016-10-15 DIAGNOSIS — R400 Somnolence: Secondary | ICD-10-CM | POA: Diagnosis not present

## 2016-10-15 DIAGNOSIS — I509 Heart failure, unspecified: Secondary | ICD-10-CM | POA: Diagnosis not present

## 2016-10-15 LAB — BASIC METABOLIC PANEL
ANION GAP: 5 (ref 5–15)
BUN: 9 mg/dL (ref 6–20)
CALCIUM: 8.7 mg/dL — AB (ref 8.9–10.3)
CO2: 25 mmol/L (ref 22–32)
Chloride: 106 mmol/L (ref 101–111)
Creatinine, Ser: 1.1 mg/dL — ABNORMAL HIGH (ref 0.44–1.00)
GFR, EST AFRICAN AMERICAN: 51 mL/min — AB (ref >60)
GFR, EST NON AFRICAN AMERICAN: 44 mL/min — AB (ref >60)
Glucose, Bld: 86 mg/dL (ref 65–99)
Potassium: 3.6 mmol/L (ref 3.5–5.1)
SODIUM: 136 mmol/L (ref 135–145)

## 2016-10-15 LAB — RENAL FUNCTION PANEL
Albumin: 2.6 g/dL — ABNORMAL LOW (ref 3.5–5.0)
Anion gap: 5 (ref 5–15)
BUN: 9 mg/dL (ref 6–20)
CHLORIDE: 107 mmol/L (ref 101–111)
CO2: 25 mmol/L (ref 22–32)
Calcium: 8.7 mg/dL — ABNORMAL LOW (ref 8.9–10.3)
Creatinine, Ser: 1.1 mg/dL — ABNORMAL HIGH (ref 0.44–1.00)
GFR calc Af Amer: 51 mL/min — ABNORMAL LOW (ref >60)
GFR calc non Af Amer: 44 mL/min — ABNORMAL LOW (ref >60)
GLUCOSE: 87 mg/dL (ref 65–99)
Phosphorus: 3.3 mg/dL (ref 2.5–4.6)
Potassium: 3.6 mmol/L (ref 3.5–5.1)
Sodium: 137 mmol/L (ref 135–145)

## 2016-10-15 LAB — CBC
HCT: 31 % — ABNORMAL LOW (ref 36.0–46.0)
Hemoglobin: 9.9 g/dL — ABNORMAL LOW (ref 12.0–15.0)
MCH: 30.2 pg (ref 26.0–34.0)
MCHC: 31.9 g/dL (ref 30.0–36.0)
MCV: 94.5 fL (ref 78.0–100.0)
PLATELETS: 196 10*3/uL (ref 150–400)
RBC: 3.28 MIL/uL — ABNORMAL LOW (ref 3.87–5.11)
RDW: 15.8 % — ABNORMAL HIGH (ref 11.5–15.5)
WBC: 7.4 10*3/uL (ref 4.0–10.5)

## 2016-10-15 LAB — PROTIME-INR
INR: 1.73
PROTHROMBIN TIME: 20.5 s — AB (ref 11.4–15.2)

## 2016-10-15 LAB — MAGNESIUM: Magnesium: 2.1 mg/dL (ref 1.7–2.4)

## 2016-10-15 LAB — BRAIN NATRIURETIC PEPTIDE: B NATRIURETIC PEPTIDE 5: 477.4 pg/mL — AB (ref 0.0–100.0)

## 2016-10-15 LAB — HEPARIN LEVEL (UNFRACTIONATED): Heparin Unfractionated: 0.63 IU/mL (ref 0.30–0.70)

## 2016-10-15 MED ORDER — WARFARIN SODIUM 2 MG PO TABS
2.0000 mg | ORAL_TABLET | Freq: Once | ORAL | Status: AC
  Administered 2016-10-15: 2 mg via ORAL
  Filled 2016-10-15: qty 1

## 2016-10-15 NOTE — Progress Notes (Signed)
PROGRESS NOTE    [name]  [id number] DOB: 1926-09-30 DOA: 10/03/2016 PCP: Tamsen Roers, MD    Brief Narrative: Patient is a 83 year old female history of CHF, A. fib on chronic anticoagulation, COPD who presented with acute hypoxic respiratory failure likely secondary to acute CHF exacerbation. Patient also noted to be in acute renal failure. Because of also possible pneumonia. Patient seen by critical care and was being managed by critical care. Patient with some clinical improvement and transferred to the floor. Patient noted to have a new cardiomyopathy with a EF of approximately 30%. Cardiology consulted. Patient also with some dysphagia. Palliative care consultation for goals of care.   Assessment & Plan:   Principal Problem:   Acute respiratory failure with hypoxia (HCC) Active Problems:   Atrial fibrillation (HCC)   Hypothyroidism   CAP (community acquired pneumonia)   AKI (acute kidney injury) ([place])   Hypokalemia   Normocytic anemia   Chronic diastolic CHF (congestive heart failure) (HCC)   Hypotension   COPD with acute exacerbation (HCC)   Sepsis due to pneumonia ([place])   Acute encephalopathy   Acute systolic heart failure (HCC)   Cardiomyopathy ([place])   Goals of care, counseling/discussion   Palliative care encounter   Encounter for hospice care discussion   Dysphagia   Respiratory distress  #1 acute respiratory failure with hypoxia secondary to acute systolic heart failure and pleural effusions in the setting of restrictive lung disease. Patient initially admitted to critical care and initially being treated for probable pneumonia and COPD exacerbation. Chest x-ray consistent with volume overload. Patient with elevated BNP. 2-D echo which was done with a new cardiomyopathy with a EF of 30-35% with wall motion abnormalities. Patient with multiple comorbidities. Patient asking for aggressive treatment. Patient Was on IV Lasix twice daily which have subsequently  been held secondary to hypotension. Patient no longer volume overloaded. Patient has been seen by cardiology and her medications have been adjusted per cardiology.Cardiology following and appreciate input and recommendations. Patient cleared patient for discharge to SNF  #2 hypokalemia Likely secondary to diuresis. Stable  #3 atrial fibrillationCHA2DS2-VASC 5 Currently rate controlled.. Beta blocker has been on hold in order to allow fluid removal by critical care. Per cardiology may resume beta blocker on discharge it pressure tolerates. Patient had been on Coumadin. Plan to continue on lovenox bridge.  #4 acute renal failure Secondary to acute systolic heart failure. Improved with diuresis. Diuretics on hold still. Likely secondary to hypotension. Stable  #5 protein calorie malnutrition Patient has been assessed by speech therapy. Patient with some significant esophageal deficits and at increased risk for aspiration. Patient diet has been advanced to a soft diet/ regular diet that she is tolerating. Remains stable  #6 dysphagia Patient with some esophageal deficits. Patient with increased risk of aspiration. Speech therapy following and diet has been advanced to a regular diet. stable  #7 acute encephalopathy Intermittent.  Questioning polypharmacy  #8 hypothyroidism Had changed toIV Synthroid to oral synthroid.  #9 neuropathy Have resumed home regimen gabapentin.  #10 chronic pain Resumed home regimen of Robaxin and Percocet as needed.  #11 hypotension Patient noted to be hypotensive On 02/26/2018and received A few boluses of normal saline.IV diuetics were discontinued and antihypertensive medications held. Patient mentating well. Blood pressure improved and in sbp 90-100s. Cardiology had been following. Stable from Cardiology standpoint for SNF as of 2/28   DVT prophylaxis: Heparin/Coumadin Code Status: DNR Family Communication: updated patient and daughter at  bedside. Disposition Plan:  Skilled  nursing facility  When okay with cardiology hopefully tomorrow.   Consultants:   Cardiology Dr. Dorris Carnes 10/09/2016>>>> Dr. Einar Gip 10/13/2016  Palliative care Dr. Rowe Pavy 10/09/2016  Procedures:  PFT 09/12/13: FVC 1.37 L (74%) FEV1 0.98 L (82%) FEV1/FVC 0.71 FEF 25-75 0.71 L (57%) negative bronchodilator response TLC 3.57 L (68%) RV 86% ERV 66%  PORT CXR 2/19:  Personally reviewed by me. Hazy bilateral basilar opacities. Mild alveolar opacities within the mid and upper lung zones as well. PCXR: 2/22 worsening bilateral aeration. Likely element of both edema and effusion but rotated film makes difficult to eval   2-D echo 10/07/2016--- EF [id number], systolic function moderately to severely reduced, severely dilated left atrium. Mildly reduced right ventricular systolic function. Basal to mid anterior septal akinesis. Basal to mid inferior septal akinesis. Basal to mid inferior akinesis.  LINES/TUBES: Foley 2/18 >>> RUE DL PICC 2/18 >>> PIV    Antimicrobials:  Rocephin 2/17 - 2/19 Zosyn 2/19 - 2/20 Azithromycin 2/17 >>> (stop date 2/21) Cefepime 2/20 >>>2/21  Blood Cultures x2 2/17 >>> MRSA PCR 2/18:  Negative  Urine Culture 2/18:  Negative  Urine Streptococcal Antigen 2/18:  Negative  Urine Legionella Antigen 2/20 >>>   Subjective: No complaints this AM  Objective: Vitals:   10/14/16 2321 10/15/16 0422 10/15/16 0500 10/15/16 0829  BP: 101/63 (!) 105/53    Pulse: 78 72    Resp:  16    Temp:  97.7 F (36.5 C)    TempSrc:  Oral    SpO2:  96%  97%  Weight:   59.3 kg (130 lb 11.7 oz)   Height:        Intake/Output Summary (Last 24 hours) at 10/15/16 1244 Last data filed at 10/15/16 0954  Gross per 24 hour  Intake          1084.29 ml  Output                0 ml  Net          1084.29 ml   Filed Weights   10/13/16 0506 10/14/16 0455 10/15/16 0500  Weight: 59.7 kg (131 lb 9.8 oz) 60.1 kg (132 lb 7.9 oz) 59.3 kg (130 lb 11.7 oz)     Examination:  General exam: Appears calm and comfortable  Respiratory system: Decreased breath sounds in the bases. Cardiovascular system: Irregularly irregular. No JVD, murmurs, rubs, gallops or clicks. No pedal edema. Gastrointestinal system: Abdomen is nondistended, soft and nontender. No organomegaly or masses felt. Normal bowel sounds heard. Central nervous system: Alert and oriented. No focal neurological deficits. Extremities: Symmetric 5 x 5 power. Skin: No rashes, lesions or ulcers Psychiatry: Judgement and insight appear fair. Mood & affect appropriate.     Data Reviewed: I have personally reviewed following labs and imaging studies  CBC:  Recent Labs Lab 10/11/16 0325 10/12/16 0408 10/13/16 0416 10/14/16 0416 10/15/16 0439  WBC 7.4 7.1 7.0 8.1 7.4  HGB 10.1* 10.5* 10.3* 9.9* 9.9*  HCT 30.4* 31.3* 30.5* 29.8* 31.0*  MCV 93.8 93.2 93.8 94.3 94.5  PLT 157 185 166 183 [id number]   Basic Metabolic Panel:  Recent Labs Lab 10/11/16 0325 10/12/16 0408 10/13/16 0416 10/14/16 0416 10/15/16 0439  NA 136 133* 134* 135 137  136  K 3.6 3.4* 4.1 3.8 3.6  3.6  CL 95* 94* 98* 102 107  106  CO2 31 31 27 24 25  25   GLUCOSE 80 88 83 76 87  86  BUN 13 13  13 11 9  9   CREATININE 1.02* 1.09* 1.05* 1.04* 1.10*  1.10*  CALCIUM 9.0 8.9 8.9 8.6* 8.7*  8.7*  MG 1.9 1.9 2.0 1.9 2.1  PHOS 3.3 3.7 3.2 3.0 3.3   GFR: Estimated Creatinine Clearance: 33.7 mL/min (by C-G formula based on SCr of 1.1 mg/dL (H)). Liver Function Tests:  Recent Labs Lab 10/11/16 0325 10/12/16 0408 10/13/16 0416 10/14/16 0416 10/15/16 0439  ALBUMIN 2.5* 2.6* 2.4* 2.4* 2.6*   No results for input(s): LIPASE, AMYLASE in the last 168 hours. No results for input(s): AMMONIA in the last 168 hours. Coagulation Profile:  Recent Labs Lab 10/11/16 0325 10/12/16 0408 10/13/16 0416 10/14/16 0416 10/15/16 0439  INR 1.19 1.49 1.71 1.79 1.73   Cardiac Enzymes: No results for input(s): CKTOTAL,  CKMB, CKMBINDEX, TROPONINI in the last 168 hours. BNP (last 3 results) No results for input(s): PROBNP in the last 8760 hours. HbA1C: No results for input(s): HGBA1C in the last 72 hours. CBG: No results for input(s): GLUCAP in the last 168 hours. Lipid Profile: No results for input(s): CHOL, HDL, LDLCALC, TRIG, CHOLHDL, LDLDIRECT in the last 72 hours. Thyroid Function Tests: No results for input(s): TSH, T4TOTAL, FREET4, T3FREE, THYROIDAB in the last 72 hours. Anemia Panel: No results for input(s): VITAMINB12, FOLATE, FERRITIN, TIBC, IRON, RETICCTPCT in the last 72 hours. Sepsis Labs: No results for input(s): PROCALCITON, LATICACIDVEN in the last 168 hours.  No results found for this or any previous visit (from the past 240 hour(s)).       Radiology Studies: Dg Chest Port 1 View  Result Date: 10/14/2016 CLINICAL DATA:  Pneumonia, history atrial fibrillation, COPD EXAM: PORTABLE CHEST 1 VIEW COMPARISON:  Portable exam 1531 hours compared 10/08/2016 FINDINGS: RIGHT arm PICC line tip projecting over the heart though patient is markedly kyphotic and rotated, uncertain position. Enlargement of cardiac silhouette. Bibasilar lung opacities which may represent atelectasis or infiltrate in combination with bibasilar effusions. Upper lungs clear. No gross evidence of pneumothorax. Diffuse osseous demineralization. IMPRESSION: Enlargement of cardiac silhouette. Bibasilar opacities likely representing a combination of pleural effusions with atelectasis versus infiltrate in the lower lungs. Electronically Signed   By: Lavonia Dana M.D.   On: 10/14/2016 15:53        Scheduled Meds: . budesonide  0.5 mg Nebulization BID  . dorzolamide  1 drop Left Eye BID  . FLUoxetine  20 mg Oral Daily  . furosemide  20 mg Oral Daily  . gabapentin  600 mg Oral QHS  . levothyroxine  75 mcg Oral QAC breakfast  . mouth rinse  15 mL Mouth Rinse BID  . methocarbamol  500 mg Oral BID  . metoprolol succinate   12.5 mg Oral Daily  . pantoprazole  40 mg Oral Daily  . pilocarpine  1 drop Left Eye BID  . sodium chloride flush  10-40 mL Intracatheter Q12H  . timolol  1 drop Left Eye BID  . Travoprost (BAK Free)  1 drop Left Eye QHS  . warfarin  2 mg Oral ONCE-1800  . Warfarin - Pharmacist Dosing Inpatient   Does not apply q1800   Continuous Infusions: . heparin 900 Units/hr (10/15/16 0959)     LOS: 12 days    Time spent: 40 mins    Tareka Jhaveri, Orpah Melter, MD [facility] Pager [phone]  If 7PM-7AM, please contact night-coverage [email or web address] Password [facility] 10/15/2016, 12:44 PM

## 2016-10-15 NOTE — Clinical Social Work Placement (Signed)
   CLINICAL SOCIAL WORK PLACEMENT  NOTE  Date:  10/15/2016  Patient Details  Name: [name] MRN: [id number] Date of Birth: [birth year]  Clinical Social Work is seeking post-discharge placement for this patient at the [place] level of care (*CSW will initial, date and re-position this form in  chart as items are completed):  Yes   Patient/family provided with [place] Work Department's list of facilities offering this level of care within the geographic area requested by the patient (or if unable, by the patient's family).  Yes   Patient/family informed of their freedom to choose among providers that offer the needed level of care, that participate in Medicare, Medicaid or managed care program needed by the patient, have an available bed and are willing to accept the patient.  Yes   Patient/family informed of [place]'s ownership interest in [facility] and [facility], as well as of the fact that they are under no obligation to receive care at these facilities.  PASRR submitted to EDS on 10/14/16     PASRR number received on 10/14/16     Existing PASRR number confirmed on       FL2 transmitted to all facilities in geographic area requested by pt/family on 10/14/16     FL2 transmitted to all facilities within larger geographic area on       Patient informed that his/her managed care company has contracts with or will negotiate with certain facilities, including the following:        Yes   Patient/family informed of bed offers received.  Patient chooses bed at [place], [place]     Physician recommends and patient chooses bed at      Patient to be transferred to [place], [place] on 10/15/16.  Patient to be transferred to facility by [facility]     Patient family notified on 10/15/16 of transfer.  Name of family member notified:  Daughter     PHYSICIAN Please sign FL2     Additional Comment:     _______________________________________________ Benard Halsted, [place] 10/15/2016, 4:43 PM

## 2016-10-15 NOTE — Progress Notes (Signed)
Patient will DC to: [facility] Anticipated DC date: 10/15/16 Family notified: Daughter Transport by: Corey Harold   Per MD patient ready for DC to [facility]. RN, patient, patient's family, and facility notified of DC. Discharge Summary sent to facility. RN given number for report. DC packet on chart. Ambulance transport requested for patient.   CSW signing off.  Cedric Fishman, [place] Social Worker [phone]

## 2016-10-15 NOTE — Progress Notes (Signed)
[place] for heparin and Coumadin  Indication: atrial fibrillation  No Known Allergies  Patient Measurements: Height: 5\' 7"  (170.2 cm) Weight: 130 lb 11.7 oz (59.3 kg) IBW/kg (Calculated) : 61.6  Vital Signs: Temp: 97.7 F (36.5 C) (03/01 0422) Temp Source: Oral (03/01 0422) BP: 105/53 (03/01 0422) Pulse Rate: 72 (03/01 0422)  Labs:  Recent Labs  10/13/16 0416 10/14/16 0416 10/15/16 0439  HGB 10.3* 9.9* 9.9*  HCT 30.5* 29.8* 31.0*  PLT 166 183 196  LABPROT 20.3* 21.0* 20.5*  INR 1.71 1.79 1.73  HEPARINUNFRC 0.59 0.68 0.63  CREATININE 1.05* 1.04* 1.10*  1.10*    Estimated Creatinine Clearance: 33.7 mL/min (by C-G formula based on SCr of 1.1 mg/dL (H)).  Assessment: 83 yo female admitted with acute hypoxic respiratory failure on Coumadin 1mg  daily PTA for afib. Transitioned to heparin infusion on admission and Coumadin held due to inability to tolerate PO meds. Coumadin resumed on 2/24 and remains on heparin infusion while INR is below goal.  Heparin level in range, though on high side, this morning at 0.63 units/mL. INR down to 1.73 despite several 2 mg doses. Hgb low but stable, platelets are within normal limits - no bleeding noted.  Goal of Therapy:  INR 2-3 Heparin level 0.3-0.7 units/ml Monitor platelets by anticoagulation protocol: Yes   Plan:  -Decrease heparin infusion to 900 units/hr  -Coumadin 2 mg PO tonight  -Daily heparin level, CBC and INR -Monitor INR closely outpatient  Renold Genta, PharmD, BCPS Clinical Pharmacist Phone for today - [place] - [phone] 10/15/2016 8:30 AM

## 2016-10-15 NOTE — Progress Notes (Signed)
  Speech Language Pathology Treatment: Dysphagia  Patient Details Name: [name] MRN: [id number] DOB: Dec 23, 1926 Today's Date: 10/15/2016 Time: [id number] SLP Time Calculation (min) (ACUTE ONLY): 9 min  Assessment / Plan / Recommendation Clinical Impression  [name] appeared confused, drowsy, and complained of being tired. Pt's daughter was not present to provide an update re: patient's tolerance with diet upgrade (regular solids); daughter has been ordering softer foods from the cafeteria's menu. Observed pt with Dys 3 solids and thin liquids via cup with no indications of aspiration. Pt required verbal cueing to attend to POs and to ensure alertness while feeding. Noted belching after PO consumption likely due to esophageal impairments. ST will continue to f/u for treatment and to assess pt's diet tolerance (hope to speak with pt's daughter).   HPI HPI: [name] a 83 y.o.femalewith medical history significant forchronic atrial fibrillation on warfarin, COPD, chronic diastolic CHF, hypothyroidism, hypertension, and depression who now presents to the emergency department for evaluation of productive cough and generalized weakness. Patient had reportedly developed a productive cough and dyspnea approximately one month ago and was started on antibiotics at that time. Her symptoms failed to improve and she was recently started on a second course of antibiotics with Levaquin, in addition to prednisone. She completed the prednisone and finished her Levaquin today, but reports no improvement in her cough or dyspnea, and worsening in her generalized weakness and lethargy. Chest x-ray reveals a new air space consolidation in the lingula concerning for pneumonia. Respiratory status improved in the ED, patient on [place]. Admitted to the stepdown unit for ongoing evaluation and management of sepsis secondary to pneumonia. No prior history of dysphagia per chart. Referred by MD for swallowing evaluation  due to coughing with fluids, recurrent pneumonia. MBS on 10/04/16 revealed esophageal impairments and SLP recommended GI consult, further esophageal assessment, and NPO with ice chips PRN after oral care.       SLP Plan  Continue with current plan of care       Recommendations  Diet recommendations: Regular;Other(comment) (daughter will order softer foods) Liquids provided via: Cup;No straw Medication Administration: Crushed with puree Supervision: Staff to assist with self feeding;Full supervision/cueing for compensatory strategies Compensations: Slow rate;Small sips/bites;Lingual sweep for clearance of pocketing Postural Changes and/or Swallow Maneuvers: Seated upright 90 degrees                Oral Care Recommendations: Oral care BID Follow up Recommendations: Other (comment) (TBD) SLP Visit Diagnosis: Dysphagia, pharyngoesophageal phase (R13.14) Plan: Continue with current plan of care       [place] , [place] 10/15/2016, 4:10 PM

## 2016-10-15 NOTE — Progress Notes (Signed)
Pt prepared for d/c to SNF. IV d/c'd. Skin intact except as charted in most recent assessments. Vitals are stable. Report called to receiving facility. Pt to be transported by ambulance service. 

## 2016-10-15 NOTE — Progress Notes (Signed)
Physical Therapy Treatment Patient Details Name: [name] MRN: [id number] DOB: 11-04-1926 Today's Date: 10/15/2016    History of Present Illness Pt is an 83 y/o female who presents with SOB and intermittent periods of confusion. She was admitted with sepsis 2 CAP and COPD exacerbation. Pts daughter present for this eval.  pt was doing her transfers on her own - all scoot transfers - to toilet and chair and shower etc.  they have had a lot of homehealth in the past and have good set up for pt. she waters her plants and bakes cookies - etc - all from wheelchair level.    PT Comments    Pt was able to sit on a bedpan during PT visit, from chair with nursing in to support the activity.  Afterward was more comfortable and positioned her with spinal support and reclined her minimally to allow better interaction with nursing and visitors.  Will continue acutely as needed for strengthening and transfer practice as pt has been independent from chair level at home, and hopes to return there after rehab.   Follow Up Recommendations  SNF     Equipment Recommendations  None recommended by PT    Recommendations for Other Services       Precautions / Restrictions Precautions Precautions: Fall Restrictions Weight Bearing Restrictions: No    Mobility  Bed Mobility Overal bed mobility: Needs Assistance Bed Mobility: Supine to Sit     Supine to sit: Mod assist     General bed mobility comments: supported her effort to scoot out to side of bed with bed pad  Transfers Overall transfer level: Needs assistance Equipment used:  (one person assist to squat pivot and one to help pivot hips) Transfers: Squat Pivot Transfers Sit to Stand: Max assist;Total assist   Squat pivot transfers: Max assist;Total assist        Ambulation/Gait             General Gait Details: unable   Stairs            Wheelchair Mobility    Modified Rankin (Stroke Patients Only)        Balance   Sitting-balance support: Bilateral upper extremity supported;Feet supported Sitting balance-Leahy Scale: Poor Sitting balance - Comments: spinal shift in kyphosis but is able to control with side support                            Cognition Arousal/Alertness: Awake/alert Behavior During Therapy: Flat affect Overall Cognitive Status: History of cognitive impairments - at baseline                      Exercises      General Comments General comments (skin integrity, edema, etc.): Pt positioned in chair with pillows and small recline of chair to support her spinal change, and then to elevate legs comfortably      Pertinent Vitals/Pain Pain Assessment: No/denies pain    Home Living                      Prior Function            PT Goals (current goals can now be found in the care plan section) Acute Rehab PT Goals Patient Stated Goal: return home Progress towards PT goals: Progressing toward goals    Frequency    Min 2X/week      PT Plan Current plan remains  appropriate    Co-evaluation             End of Session   Activity Tolerance: Patient limited by fatigue;Other (comment) (generalized weakness) Patient left: in chair;with call bell/phone within reach;with chair alarm set;with nursing/sitter in room Nurse Communication: Mobility status PT Visit Diagnosis: Muscle weakness (generalized) (M62.81)     Time: [id number] PT Time Calculation (min) (ACUTE ONLY): 35 min  Charges:  $Therapeutic Activity: 23-37 mins                    G Codes:       Ramond Dial October 18, 2016, 11:10 AM   Mee Hives, PT MS Acute Rehab Dept. Number: [place] and [place]

## 2016-10-18 DIAGNOSIS — J9600 Acute respiratory failure, unspecified whether with hypoxia or hypercapnia: Secondary | ICD-10-CM | POA: Diagnosis not present

## 2016-10-18 DIAGNOSIS — M545 Low back pain: Secondary | ICD-10-CM | POA: Diagnosis not present

## 2016-10-18 DIAGNOSIS — R400 Somnolence: Secondary | ICD-10-CM | POA: Diagnosis not present

## 2016-10-18 DIAGNOSIS — E440 Moderate protein-calorie malnutrition: Secondary | ICD-10-CM | POA: Diagnosis not present

## 2016-10-18 DIAGNOSIS — I480 Paroxysmal atrial fibrillation: Secondary | ICD-10-CM | POA: Diagnosis not present

## 2016-10-18 DIAGNOSIS — R2689 Other abnormalities of gait and mobility: Secondary | ICD-10-CM | POA: Diagnosis not present

## 2016-10-22 DIAGNOSIS — J432 Centrilobular emphysema: Secondary | ICD-10-CM | POA: Diagnosis not present

## 2016-10-22 DIAGNOSIS — I5042 Chronic combined systolic (congestive) and diastolic (congestive) heart failure: Secondary | ICD-10-CM | POA: Diagnosis not present

## 2016-10-22 DIAGNOSIS — I482 Chronic atrial fibrillation: Secondary | ICD-10-CM | POA: Diagnosis not present

## 2016-10-22 DIAGNOSIS — R0602 Shortness of breath: Secondary | ICD-10-CM | POA: Diagnosis not present

## 2016-10-29 ENCOUNTER — Encounter: Payer: Self-pay | Admitting: Nurse Practitioner

## 2016-10-29 ENCOUNTER — Ambulatory Visit (INDEPENDENT_AMBULATORY_CARE_PROVIDER_SITE_OTHER): Payer: Medicare Other | Admitting: Nurse Practitioner

## 2016-10-29 VITALS — BP 100/60 | HR 72 | Wt 135.0 lb

## 2016-10-29 DIAGNOSIS — R1310 Dysphagia, unspecified: Secondary | ICD-10-CM

## 2016-10-29 NOTE — Patient Instructions (Signed)
You have been scheduled for a Barium Esophogram at [facility] (1st floor of the hospital) on 11/04/2016 at 11am. Please arrive 15 minutes prior to your appointment for registration. Make certain not to have anything to eat or drink 8 hours prior to your test. If you need to reschedule for any reason, please contact radiology at [phone] to do so. __________________________________________________________________ A barium swallow is an examination that concentrates on views of the esophagus. This tends to be a double contrast exam (barium and two liquids which, when combined, create a gas to distend the wall of the oesophagus) or single contrast (non-ionic iodine based). The study is usually tailored to your symptoms so a good history is essential. Attention is paid during the study to the form, structure and configuration of the esophagus, looking for functional disorders (such as aspiration, dysphagia, achalasia, motility and reflux) EXAMINATION You may be asked to change into a gown, depending on the type of swallow being performed. A radiologist and radiographer will perform the procedure. The radiologist will advise you of the type of contrast selected for your procedure and direct you during the exam. You will be asked to stand, sit or lie in several different positions and to hold a small amount of fluid in your mouth before being asked to swallow while the imaging is performed .In some instances you may be asked to swallow barium coated marshmallows to assess the motility of a solid food bolus. The exam can be recorded as a digital or video fluoroscopy procedure. POST PROCEDURE It will take 1-2 days for the barium to pass through your system. To facilitate this, it is important, unless otherwise directed, to increase your fluids for the next 24-48hrs and to resume your normal diet.  This test typically takes about 30 minutes to  perform. __________________________________________________________________________________

## 2016-10-29 NOTE — Progress Notes (Signed)
HPI: Patient is an 83 year old female known remotely to Dr. Fuller Plan. She had a colonoscopy in 2012 for evaluation of blood in stool. Several tubular adenomatous colon polyps were removed. Repeat colonoscopy recommended at three years, letter sent but procedure wasn't done.   Patient is referred by Dr. Leanna Battles for dysphagia. She is currently residing at [facility] where she went following hospital admission for acute respiratory failure. Initially respiratory failure felt to be from PNA / COPD but CXR consistent with volume overload. Echo showed new cardiomyopathy with EF of 30-35% with wall motion abnormalities.   Patient here with her granddaughter who provides the history. Patient did have a MBSS and it didn't suggest any oropharyngeal dysphagia.There was one episode of aspiration of thin liquids but it appeared to be in response to solids sticking in upper esophagus. Chewing mildly impaired due to ill fitting dentures. Radiologist did esophageal sweep and saw stasis in the upper esophagus.There was severe dysmotility and reflux with backflow of a bolus into pharynx. Granddaughter states that problems swallowing has been present for at least a year but is getting worse and is present with both solids and liquids. Sounds like patient had her esophagus stretched in [place] 3-4 years ago, I don't have any records.   Granddaughter states that patient has been having frequent loose stools over the last several months. Stools are not loose everyday. In fact, patient has some days without any BMs.  No rectal bleeding to Granddaughter's knowledge and no obvious weight loss   Past Medical History:  Diagnosis Date  . Atrial fibrillation (HCC)    chronic Coumadin.   Marland Kitchen COPD (chronic obstructive pulmonary disease) ([place])   . Glaucoma   . Hypothyroidism   . Neuropathy (HCC)    PERIPHERAL  . Osteoarthritis   . Osteoporosis   . Trimalleolar fracture    LEFT TRIMALLEOLAR FRACTURE,  DISLOCATION WITH OBLIQUE FRACTURE OF THE DISTAL FIBULAR DIAPHYSIS, AND NOTED BEING MILDLY COMMINUTED     Past Surgical History:  Procedure Laterality Date  . COLONOSCOPY W/ POLYPECTOMY  11/2010   Dr Fuller Plan.  multiple polyps:cecal, transverse, sigmoid.  largest 26mm, path: tubular adenomas without HGD.  mild sigmoid diverticulosis.   Marland Kitchen ORIF TIBIA & FIBULA FRACTURES  2016  . VAGINAL HYSTERECTOMY     Family History  Problem Relation Age of Onset  . CAD Mother   . Heart failure Father   . Heart failure Brother   . CAD Brother   . Colon cancer Neg Hx   . Stomach cancer Neg Hx    Social History  Substance Use Topics  . Smoking status: Never Smoker  . Smokeless tobacco: Never Used  . Alcohol use No   Current Outpatient Prescriptions  Medication Sig Dispense Refill  . acetaminophen (TYLENOL) 325 MG tablet Take 325 mg by mouth daily as needed for headache (pain).    Marland Kitchen albuterol (PROVENTIL) (2.5 MG/3ML) 0.083% nebulizer solution Take 2.5 mg by nebulization every 4 (four) hours as needed for wheezing or shortness of breath.     . benzonatate (TESSALON) 100 MG capsule Take 100 mg by mouth 3 (three) times daily.  0  . budesonide (PULMICORT) 0.5 MG/2ML nebulizer solution Take 0.5 mg by nebulization 2 (two) times daily.  0  . dextromethorphan (DELSYM) 30 MG/5ML liquid Take 30 mg by mouth every 12 (twelve) hours as needed for cough.    . Dextromethorphan-Guaifenesin (MUCINEX DM PO) Take 10 mLs by mouth every 12 (twelve) hours.    Marland Kitchen  dorzolamide (TRUSOPT) 2 % ophthalmic solution Place 1 drop into the left eye 2 (two) times daily.    Marland Kitchen enoxaparin (LOVENOX) 60 MG/0.6ML injection Inject 0.6 mLs (60 mg total) into the skin every 12 (twelve) hours. 0 Syringe   . ergocalciferol (VITAMIN D2) 50000 UNITS capsule Take 50,000 Units by mouth every Thursday.     Marland Kitchen FLUoxetine (PROZAC) 20 MG capsule Take 20 mg by mouth daily.    . furosemide (LASIX) 20 MG tablet Take 1 tablet (20 mg total) by mouth 2 (two) times  daily. (Patient taking differently: Take 20-40 mg by mouth See admin instructions. Take 1 tablet (20 mg) by mouth every morning and 2 tablets (40 mg) at 2pm) 6 tablet 0  . gabapentin (NEURONTIN) 600 MG tablet Take 600 mg by mouth at bedtime.    Marland Kitchen levothyroxine (SYNTHROID, LEVOTHROID) 75 MCG tablet Take 75 mcg by mouth daily before breakfast.    . loperamide (IMODIUM) 2 MG capsule Take 2-4 mg by mouth See admin instructions. Take 2 capsules (4 mg) by mouth every morning, may also take 1 capsule (2 mg) during the day as needed for loose stools    . LUTEIN-ZEAXANTHIN PO Take 1 tablet by mouth daily with supper.    . methocarbamol (ROBAXIN) 500 MG tablet Take 500 mg by mouth 2 (two) times daily.    . metoprolol succinate (TOPROL-XL) 25 MG 24 hr tablet Take 0.5 tablets (12.5 mg total) by mouth daily. 30 tablet 0  . omeprazole (PRILOSEC) 40 MG capsule Take 40 mg by mouth daily.    . ondansetron (ZOFRAN-ODT) 4 MG disintegrating tablet Take 4 mg by mouth every 8 (eight) hours as needed for nausea or vomiting.     Marland Kitchen oxyCODONE-acetaminophen (PERCOCET/ROXICET) 5-325 MG per tablet Take 1-2 tablets by mouth every 4 (four) hours as needed for pain. (Patient taking differently: Take 1 tablet by mouth 2 (two) times daily. MAY TAKE 1 ADDITIONAL TABLET ONCE A DAY IF PAIN PERSISTS) 23 tablet 0  . pilocarpine (PILOCAR) 4 % ophthalmic solution Place 1 drop into the left eye 2 (two) times daily.     . potassium chloride (K-DUR) 10 MEQ tablet Take 1 tablet (10 mEq total) by mouth 2 (two) times daily. (Patient not taking: Reported on 07/05/2016) 6 tablet 0  . timolol (TIMOPTIC) 0.5 % ophthalmic solution Place 1 drop into the left eye 2 (two) times daily.    . travoprost, benzalkonium, (TRAVATAN) 0.004 % ophthalmic solution Place 1 drop into the left eye at bedtime.     Marland Kitchen warfarin (COUMADIN) 2 MG tablet Take 1 mg by mouth daily with supper.     No current facility-administered medications for this visit.    No Known  Allergies   Review of Systems: All systems reviewed and negative except where noted in HPI.    Physical Exam: BP 100/60   Pulse 72   Wt 135 lb (61.2 kg) Comment: verbal- wheelchair bound  BMI 21.14 kg/m  Constitutional:  Thin white female in wheelchair with 02 per . She is in NAD distress . Psychiatric: Quiet. Gives brief answers to questions. Denies problems swallowing.  EENT: . Conjunctivae are normal. No scleral icterus. Neck supple.  Cardiovascular: Normal rate, irregular rhythm.  Pulmonary/chest: Effort normal , diminished breath sounds bilaterally.  No wheezing, rales or rhonchi. Abdominal: Soft, nondistended, nontender. Bowel sounds active throughout.  Extremities: no edema Lymphadenopathy: No cervical adenopathy noted. Neurological: Alert and oriented to person place and time. Skin: Skin is warm and  dry. No rashes noted.   ASSESSMENT AND PLAN:  12. 83 year old female with chronic dysphagia to solids and liquids. MBSS suggests primary esophageal dysphagia involving upper esophagus where stasis was seen during a sweep by Radiology. Oropharyngeal swallowing was described as being acceptable for age. There was one episode of frank aspiration that did occur when patient coughed but it was attributed to solids sticking in the upper esophagus.  -Patient has multiple co-morbidities, was just recently hospitalized with acute respiratory failure,  new cardiomyopathy / CHF, and is on chronic coumadin for A-Fib. She is very high risk for endoscopic procedures and I explained this to the Granddaughter.who seems to understand and agree.  -Will a obtain barium swallow to look for strictures / masses and if negative for such then there is no role for EGD. If severe dysmotility or oropharyngeal dysphagia then IR could place a gastrostomy tube.  A G-tube will not prevent aspiration but it can provide a route for nutrition /meds. Will call Granddaughter with results.  2. Cardiomyopathy / heart  failure / AFib on chronic coumadin  3. Non-ambulatory. Hasn't walked in approx 3 years. She has peripheral neuropathy and has apparently broken both legs at one time.     Tye Savoy, NP  10/29/2016, 2:14 PM   Cc: Leanna Battles, MD

## 2016-10-30 NOTE — Progress Notes (Signed)
Reviewed and agree with management plan. Discussed patient in detail with Ms. Chester Holstein - multiple severe comorbidities so she is very high risk for sedation and endoscopic procedures. Abnormal MBSS however this study is inadequate for esophageal evaluation. Schedule Barium esophagram.   Norberto Sorenson T. Fuller Plan, MD [facility]

## 2016-11-04 ENCOUNTER — Ambulatory Visit (HOSPITAL_COMMUNITY)
Admission: RE | Admit: 2016-11-04 | Discharge: 2016-11-04 | Disposition: A | Discharge status: Home / Self Care | Payer: No Typology Code available for payment source | Source: Ambulatory Visit | Attending: Nurse Practitioner | Admitting: Nurse Practitioner

## 2016-11-04 DIAGNOSIS — K222 Esophageal obstruction: Secondary | ICD-10-CM | POA: Insufficient documentation

## 2016-11-04 DIAGNOSIS — R1310 Dysphagia, unspecified: Secondary | ICD-10-CM | POA: Diagnosis not present

## 2016-11-04 DIAGNOSIS — K224 Dyskinesia of esophagus: Secondary | ICD-10-CM | POA: Diagnosis not present

## 2016-11-11 DIAGNOSIS — H401133 Primary open-angle glaucoma, bilateral, severe stage: Secondary | ICD-10-CM | POA: Diagnosis not present

## 2016-11-11 DIAGNOSIS — Z961 Presence of intraocular lens: Secondary | ICD-10-CM | POA: Diagnosis not present

## 2016-11-11 DIAGNOSIS — H353130 Nonexudative age-related macular degeneration, bilateral, stage unspecified: Secondary | ICD-10-CM | POA: Diagnosis not present

## 2016-11-14 DIAGNOSIS — J441 Chronic obstructive pulmonary disease with (acute) exacerbation: Secondary | ICD-10-CM | POA: Diagnosis not present

## 2016-11-14 DIAGNOSIS — I110 Hypertensive heart disease with heart failure: Secondary | ICD-10-CM | POA: Diagnosis not present

## 2016-11-14 DIAGNOSIS — L89152 Pressure ulcer of sacral region, stage 2: Secondary | ICD-10-CM | POA: Diagnosis not present

## 2016-11-14 DIAGNOSIS — R41841 Cognitive communication deficit: Secondary | ICD-10-CM | POA: Diagnosis not present

## 2016-11-14 DIAGNOSIS — I5032 Chronic diastolic (congestive) heart failure: Secondary | ICD-10-CM | POA: Diagnosis not present

## 2016-11-14 DIAGNOSIS — L89610 Pressure ulcer of right heel, unstageable: Secondary | ICD-10-CM | POA: Diagnosis not present

## 2016-11-16 ENCOUNTER — Telehealth: Payer: Self-pay

## 2016-11-16 DIAGNOSIS — L89610 Pressure ulcer of right heel, unstageable: Secondary | ICD-10-CM | POA: Diagnosis not present

## 2016-11-16 DIAGNOSIS — J441 Chronic obstructive pulmonary disease with (acute) exacerbation: Secondary | ICD-10-CM | POA: Diagnosis not present

## 2016-11-16 DIAGNOSIS — I110 Hypertensive heart disease with heart failure: Secondary | ICD-10-CM | POA: Diagnosis not present

## 2016-11-16 DIAGNOSIS — L89152 Pressure ulcer of sacral region, stage 2: Secondary | ICD-10-CM | POA: Diagnosis not present

## 2016-11-16 DIAGNOSIS — R41841 Cognitive communication deficit: Secondary | ICD-10-CM | POA: Diagnosis not present

## 2016-11-16 DIAGNOSIS — I5032 Chronic diastolic (congestive) heart failure: Secondary | ICD-10-CM | POA: Diagnosis not present

## 2016-11-16 NOTE — Telephone Encounter (Signed)
Notes recorded by Greggory Keen, LPN on 11/15/9377 at 02:40 AM EDT I spoke with Ms. [name] at length. She has taken her mother home and is now her care giver. She was instructed on conservative care with diet and remaining up right during and after her meal, etc. By the advanced practioner from [facility]. She wants to go with this option for now. She does Have concerns about her mother's ability to swallow the Omeprazole capsules. She will discuss options with the pharmacist.

## 2016-11-16 NOTE — Telephone Encounter (Signed)
-----   Message from Ladene Artist, MD sent at 11/11/2016  4:46 PM EDT ----- Please schedule office appt with PG or me to discuss in person with the pt and daughter possible EGD with dilation in this very high risk pt.  Soft diet only for now.

## 2016-11-17 DIAGNOSIS — L89152 Pressure ulcer of sacral region, stage 2: Secondary | ICD-10-CM | POA: Diagnosis not present

## 2016-11-17 DIAGNOSIS — I110 Hypertensive heart disease with heart failure: Secondary | ICD-10-CM | POA: Diagnosis not present

## 2016-11-17 DIAGNOSIS — L89610 Pressure ulcer of right heel, unstageable: Secondary | ICD-10-CM | POA: Diagnosis not present

## 2016-11-17 DIAGNOSIS — R41841 Cognitive communication deficit: Secondary | ICD-10-CM | POA: Diagnosis not present

## 2016-11-17 DIAGNOSIS — J441 Chronic obstructive pulmonary disease with (acute) exacerbation: Secondary | ICD-10-CM | POA: Diagnosis not present

## 2016-11-17 DIAGNOSIS — I5032 Chronic diastolic (congestive) heart failure: Secondary | ICD-10-CM | POA: Diagnosis not present

## 2016-11-18 DIAGNOSIS — L89152 Pressure ulcer of sacral region, stage 2: Secondary | ICD-10-CM | POA: Diagnosis not present

## 2016-11-18 DIAGNOSIS — R41841 Cognitive communication deficit: Secondary | ICD-10-CM | POA: Diagnosis not present

## 2016-11-18 DIAGNOSIS — I5032 Chronic diastolic (congestive) heart failure: Secondary | ICD-10-CM | POA: Diagnosis not present

## 2016-11-18 DIAGNOSIS — J441 Chronic obstructive pulmonary disease with (acute) exacerbation: Secondary | ICD-10-CM | POA: Diagnosis not present

## 2016-11-18 DIAGNOSIS — L89610 Pressure ulcer of right heel, unstageable: Secondary | ICD-10-CM | POA: Diagnosis not present

## 2016-11-18 DIAGNOSIS — I110 Hypertensive heart disease with heart failure: Secondary | ICD-10-CM | POA: Diagnosis not present

## 2016-11-20 DIAGNOSIS — R41841 Cognitive communication deficit: Secondary | ICD-10-CM | POA: Diagnosis not present

## 2016-11-20 DIAGNOSIS — L89152 Pressure ulcer of sacral region, stage 2: Secondary | ICD-10-CM | POA: Diagnosis not present

## 2016-11-20 DIAGNOSIS — L89610 Pressure ulcer of right heel, unstageable: Secondary | ICD-10-CM | POA: Diagnosis not present

## 2016-11-20 DIAGNOSIS — I5032 Chronic diastolic (congestive) heart failure: Secondary | ICD-10-CM | POA: Diagnosis not present

## 2016-11-20 DIAGNOSIS — J441 Chronic obstructive pulmonary disease with (acute) exacerbation: Secondary | ICD-10-CM | POA: Diagnosis not present

## 2016-11-20 DIAGNOSIS — I110 Hypertensive heart disease with heart failure: Secondary | ICD-10-CM | POA: Diagnosis not present

## 2016-11-23 DIAGNOSIS — I110 Hypertensive heart disease with heart failure: Secondary | ICD-10-CM | POA: Diagnosis not present

## 2016-11-23 DIAGNOSIS — L89152 Pressure ulcer of sacral region, stage 2: Secondary | ICD-10-CM | POA: Diagnosis not present

## 2016-11-23 DIAGNOSIS — J441 Chronic obstructive pulmonary disease with (acute) exacerbation: Secondary | ICD-10-CM | POA: Diagnosis not present

## 2016-11-23 DIAGNOSIS — L89610 Pressure ulcer of right heel, unstageable: Secondary | ICD-10-CM | POA: Diagnosis not present

## 2016-11-23 DIAGNOSIS — I5032 Chronic diastolic (congestive) heart failure: Secondary | ICD-10-CM | POA: Diagnosis not present

## 2016-11-23 DIAGNOSIS — R41841 Cognitive communication deficit: Secondary | ICD-10-CM | POA: Diagnosis not present

## 2016-11-25 DIAGNOSIS — I110 Hypertensive heart disease with heart failure: Secondary | ICD-10-CM | POA: Diagnosis not present

## 2016-11-25 DIAGNOSIS — J441 Chronic obstructive pulmonary disease with (acute) exacerbation: Secondary | ICD-10-CM | POA: Diagnosis not present

## 2016-11-25 DIAGNOSIS — I5032 Chronic diastolic (congestive) heart failure: Secondary | ICD-10-CM | POA: Diagnosis not present

## 2016-11-25 DIAGNOSIS — L89152 Pressure ulcer of sacral region, stage 2: Secondary | ICD-10-CM | POA: Diagnosis not present

## 2016-11-25 DIAGNOSIS — R41841 Cognitive communication deficit: Secondary | ICD-10-CM | POA: Diagnosis not present

## 2016-11-25 DIAGNOSIS — L89610 Pressure ulcer of right heel, unstageable: Secondary | ICD-10-CM | POA: Diagnosis not present

## 2016-11-26 DIAGNOSIS — I110 Hypertensive heart disease with heart failure: Secondary | ICD-10-CM | POA: Diagnosis not present

## 2016-11-26 DIAGNOSIS — R41841 Cognitive communication deficit: Secondary | ICD-10-CM | POA: Diagnosis not present

## 2016-11-26 DIAGNOSIS — J441 Chronic obstructive pulmonary disease with (acute) exacerbation: Secondary | ICD-10-CM | POA: Diagnosis not present

## 2016-11-26 DIAGNOSIS — L89610 Pressure ulcer of right heel, unstageable: Secondary | ICD-10-CM | POA: Diagnosis not present

## 2016-11-26 DIAGNOSIS — I5032 Chronic diastolic (congestive) heart failure: Secondary | ICD-10-CM | POA: Diagnosis not present

## 2016-11-26 DIAGNOSIS — L89152 Pressure ulcer of sacral region, stage 2: Secondary | ICD-10-CM | POA: Diagnosis not present

## 2016-11-27 DIAGNOSIS — I5032 Chronic diastolic (congestive) heart failure: Secondary | ICD-10-CM | POA: Diagnosis not present

## 2016-11-27 DIAGNOSIS — I110 Hypertensive heart disease with heart failure: Secondary | ICD-10-CM | POA: Diagnosis not present

## 2016-11-27 DIAGNOSIS — L89152 Pressure ulcer of sacral region, stage 2: Secondary | ICD-10-CM | POA: Diagnosis not present

## 2016-11-27 DIAGNOSIS — J441 Chronic obstructive pulmonary disease with (acute) exacerbation: Secondary | ICD-10-CM | POA: Diagnosis not present

## 2016-11-27 DIAGNOSIS — R41841 Cognitive communication deficit: Secondary | ICD-10-CM | POA: Diagnosis not present

## 2016-11-27 DIAGNOSIS — L89610 Pressure ulcer of right heel, unstageable: Secondary | ICD-10-CM | POA: Diagnosis not present

## 2016-11-30 DIAGNOSIS — I110 Hypertensive heart disease with heart failure: Secondary | ICD-10-CM | POA: Diagnosis not present

## 2016-11-30 DIAGNOSIS — R41841 Cognitive communication deficit: Secondary | ICD-10-CM | POA: Diagnosis not present

## 2016-11-30 DIAGNOSIS — J441 Chronic obstructive pulmonary disease with (acute) exacerbation: Secondary | ICD-10-CM | POA: Diagnosis not present

## 2016-11-30 DIAGNOSIS — I5032 Chronic diastolic (congestive) heart failure: Secondary | ICD-10-CM | POA: Diagnosis not present

## 2016-11-30 DIAGNOSIS — L89610 Pressure ulcer of right heel, unstageable: Secondary | ICD-10-CM | POA: Diagnosis not present

## 2016-11-30 DIAGNOSIS — L89152 Pressure ulcer of sacral region, stage 2: Secondary | ICD-10-CM | POA: Diagnosis not present

## 2016-12-01 DIAGNOSIS — I110 Hypertensive heart disease with heart failure: Secondary | ICD-10-CM | POA: Diagnosis not present

## 2016-12-01 DIAGNOSIS — R41841 Cognitive communication deficit: Secondary | ICD-10-CM | POA: Diagnosis not present

## 2016-12-01 DIAGNOSIS — L89152 Pressure ulcer of sacral region, stage 2: Secondary | ICD-10-CM | POA: Diagnosis not present

## 2016-12-01 DIAGNOSIS — L89610 Pressure ulcer of right heel, unstageable: Secondary | ICD-10-CM | POA: Diagnosis not present

## 2016-12-01 DIAGNOSIS — J441 Chronic obstructive pulmonary disease with (acute) exacerbation: Secondary | ICD-10-CM | POA: Diagnosis not present

## 2016-12-01 DIAGNOSIS — I5032 Chronic diastolic (congestive) heart failure: Secondary | ICD-10-CM | POA: Diagnosis not present

## 2016-12-02 DIAGNOSIS — I110 Hypertensive heart disease with heart failure: Secondary | ICD-10-CM | POA: Diagnosis not present

## 2016-12-02 DIAGNOSIS — L89152 Pressure ulcer of sacral region, stage 2: Secondary | ICD-10-CM | POA: Diagnosis not present

## 2016-12-02 DIAGNOSIS — L89610 Pressure ulcer of right heel, unstageable: Secondary | ICD-10-CM | POA: Diagnosis not present

## 2016-12-02 DIAGNOSIS — J441 Chronic obstructive pulmonary disease with (acute) exacerbation: Secondary | ICD-10-CM | POA: Diagnosis not present

## 2016-12-02 DIAGNOSIS — R41841 Cognitive communication deficit: Secondary | ICD-10-CM | POA: Diagnosis not present

## 2016-12-02 DIAGNOSIS — I5032 Chronic diastolic (congestive) heart failure: Secondary | ICD-10-CM | POA: Diagnosis not present

## 2016-12-03 DIAGNOSIS — I110 Hypertensive heart disease with heart failure: Secondary | ICD-10-CM | POA: Diagnosis not present

## 2016-12-03 DIAGNOSIS — I5032 Chronic diastolic (congestive) heart failure: Secondary | ICD-10-CM | POA: Diagnosis not present

## 2016-12-03 DIAGNOSIS — L89610 Pressure ulcer of right heel, unstageable: Secondary | ICD-10-CM | POA: Diagnosis not present

## 2016-12-03 DIAGNOSIS — R41841 Cognitive communication deficit: Secondary | ICD-10-CM | POA: Diagnosis not present

## 2016-12-03 DIAGNOSIS — L89152 Pressure ulcer of sacral region, stage 2: Secondary | ICD-10-CM | POA: Diagnosis not present

## 2016-12-03 DIAGNOSIS — J441 Chronic obstructive pulmonary disease with (acute) exacerbation: Secondary | ICD-10-CM | POA: Diagnosis not present

## 2016-12-04 DIAGNOSIS — R41841 Cognitive communication deficit: Secondary | ICD-10-CM | POA: Diagnosis not present

## 2016-12-04 DIAGNOSIS — I110 Hypertensive heart disease with heart failure: Secondary | ICD-10-CM | POA: Diagnosis not present

## 2016-12-04 DIAGNOSIS — J441 Chronic obstructive pulmonary disease with (acute) exacerbation: Secondary | ICD-10-CM | POA: Diagnosis not present

## 2016-12-04 DIAGNOSIS — L89610 Pressure ulcer of right heel, unstageable: Secondary | ICD-10-CM | POA: Diagnosis not present

## 2016-12-04 DIAGNOSIS — L89152 Pressure ulcer of sacral region, stage 2: Secondary | ICD-10-CM | POA: Diagnosis not present

## 2016-12-04 DIAGNOSIS — I5032 Chronic diastolic (congestive) heart failure: Secondary | ICD-10-CM | POA: Diagnosis not present

## 2016-12-05 DIAGNOSIS — R41841 Cognitive communication deficit: Secondary | ICD-10-CM | POA: Diagnosis not present

## 2016-12-05 DIAGNOSIS — I110 Hypertensive heart disease with heart failure: Secondary | ICD-10-CM | POA: Diagnosis not present

## 2016-12-05 DIAGNOSIS — J441 Chronic obstructive pulmonary disease with (acute) exacerbation: Secondary | ICD-10-CM | POA: Diagnosis not present

## 2016-12-05 DIAGNOSIS — I5032 Chronic diastolic (congestive) heart failure: Secondary | ICD-10-CM | POA: Diagnosis not present

## 2016-12-05 DIAGNOSIS — L89610 Pressure ulcer of right heel, unstageable: Secondary | ICD-10-CM | POA: Diagnosis not present

## 2016-12-05 DIAGNOSIS — L89152 Pressure ulcer of sacral region, stage 2: Secondary | ICD-10-CM | POA: Diagnosis not present

## 2016-12-07 DIAGNOSIS — L89152 Pressure ulcer of sacral region, stage 2: Secondary | ICD-10-CM | POA: Diagnosis not present

## 2016-12-07 DIAGNOSIS — I5032 Chronic diastolic (congestive) heart failure: Secondary | ICD-10-CM | POA: Diagnosis not present

## 2016-12-07 DIAGNOSIS — J441 Chronic obstructive pulmonary disease with (acute) exacerbation: Secondary | ICD-10-CM | POA: Diagnosis not present

## 2016-12-07 DIAGNOSIS — I110 Hypertensive heart disease with heart failure: Secondary | ICD-10-CM | POA: Diagnosis not present

## 2016-12-07 DIAGNOSIS — L89610 Pressure ulcer of right heel, unstageable: Secondary | ICD-10-CM | POA: Diagnosis not present

## 2016-12-07 DIAGNOSIS — R41841 Cognitive communication deficit: Secondary | ICD-10-CM | POA: Diagnosis not present

## 2016-12-08 DIAGNOSIS — J441 Chronic obstructive pulmonary disease with (acute) exacerbation: Secondary | ICD-10-CM | POA: Diagnosis not present

## 2016-12-08 DIAGNOSIS — L89152 Pressure ulcer of sacral region, stage 2: Secondary | ICD-10-CM | POA: Diagnosis not present

## 2016-12-08 DIAGNOSIS — I110 Hypertensive heart disease with heart failure: Secondary | ICD-10-CM | POA: Diagnosis not present

## 2016-12-08 DIAGNOSIS — L89610 Pressure ulcer of right heel, unstageable: Secondary | ICD-10-CM | POA: Diagnosis not present

## 2016-12-08 DIAGNOSIS — R41841 Cognitive communication deficit: Secondary | ICD-10-CM | POA: Diagnosis not present

## 2016-12-08 DIAGNOSIS — I5032 Chronic diastolic (congestive) heart failure: Secondary | ICD-10-CM | POA: Diagnosis not present

## 2016-12-09 DIAGNOSIS — I110 Hypertensive heart disease with heart failure: Secondary | ICD-10-CM | POA: Diagnosis not present

## 2016-12-09 DIAGNOSIS — J441 Chronic obstructive pulmonary disease with (acute) exacerbation: Secondary | ICD-10-CM | POA: Diagnosis not present

## 2016-12-09 DIAGNOSIS — I5032 Chronic diastolic (congestive) heart failure: Secondary | ICD-10-CM | POA: Diagnosis not present

## 2016-12-09 DIAGNOSIS — R41841 Cognitive communication deficit: Secondary | ICD-10-CM | POA: Diagnosis not present

## 2016-12-09 DIAGNOSIS — L89610 Pressure ulcer of right heel, unstageable: Secondary | ICD-10-CM | POA: Diagnosis not present

## 2016-12-09 DIAGNOSIS — L89152 Pressure ulcer of sacral region, stage 2: Secondary | ICD-10-CM | POA: Diagnosis not present

## 2016-12-10 DIAGNOSIS — L89610 Pressure ulcer of right heel, unstageable: Secondary | ICD-10-CM | POA: Diagnosis not present

## 2016-12-10 DIAGNOSIS — J441 Chronic obstructive pulmonary disease with (acute) exacerbation: Secondary | ICD-10-CM | POA: Diagnosis not present

## 2016-12-10 DIAGNOSIS — R41841 Cognitive communication deficit: Secondary | ICD-10-CM | POA: Diagnosis not present

## 2016-12-10 DIAGNOSIS — I110 Hypertensive heart disease with heart failure: Secondary | ICD-10-CM | POA: Diagnosis not present

## 2016-12-10 DIAGNOSIS — I5032 Chronic diastolic (congestive) heart failure: Secondary | ICD-10-CM | POA: Diagnosis not present

## 2016-12-10 DIAGNOSIS — L89152 Pressure ulcer of sacral region, stage 2: Secondary | ICD-10-CM | POA: Diagnosis not present

## 2016-12-11 DIAGNOSIS — J441 Chronic obstructive pulmonary disease with (acute) exacerbation: Secondary | ICD-10-CM | POA: Diagnosis not present

## 2016-12-11 DIAGNOSIS — I110 Hypertensive heart disease with heart failure: Secondary | ICD-10-CM | POA: Diagnosis not present

## 2016-12-11 DIAGNOSIS — I5032 Chronic diastolic (congestive) heart failure: Secondary | ICD-10-CM | POA: Diagnosis not present

## 2016-12-11 DIAGNOSIS — R41841 Cognitive communication deficit: Secondary | ICD-10-CM | POA: Diagnosis not present

## 2016-12-11 DIAGNOSIS — L89152 Pressure ulcer of sacral region, stage 2: Secondary | ICD-10-CM | POA: Diagnosis not present

## 2016-12-11 DIAGNOSIS — L89610 Pressure ulcer of right heel, unstageable: Secondary | ICD-10-CM | POA: Diagnosis not present

## 2016-12-14 ENCOUNTER — Ambulatory Visit: Payer: Medicare Other | Admitting: Gastroenterology

## 2016-12-14 DIAGNOSIS — I110 Hypertensive heart disease with heart failure: Secondary | ICD-10-CM | POA: Diagnosis not present

## 2016-12-14 DIAGNOSIS — R41841 Cognitive communication deficit: Secondary | ICD-10-CM | POA: Diagnosis not present

## 2016-12-14 DIAGNOSIS — L89610 Pressure ulcer of right heel, unstageable: Secondary | ICD-10-CM | POA: Diagnosis not present

## 2016-12-14 DIAGNOSIS — J441 Chronic obstructive pulmonary disease with (acute) exacerbation: Secondary | ICD-10-CM | POA: Diagnosis not present

## 2016-12-14 DIAGNOSIS — I5032 Chronic diastolic (congestive) heart failure: Secondary | ICD-10-CM | POA: Diagnosis not present

## 2016-12-14 DIAGNOSIS — L89152 Pressure ulcer of sacral region, stage 2: Secondary | ICD-10-CM | POA: Diagnosis not present

## 2016-12-17 DIAGNOSIS — I5032 Chronic diastolic (congestive) heart failure: Secondary | ICD-10-CM | POA: Diagnosis not present

## 2016-12-17 DIAGNOSIS — I110 Hypertensive heart disease with heart failure: Secondary | ICD-10-CM | POA: Diagnosis not present

## 2016-12-17 DIAGNOSIS — L89610 Pressure ulcer of right heel, unstageable: Secondary | ICD-10-CM | POA: Diagnosis not present

## 2016-12-17 DIAGNOSIS — L89152 Pressure ulcer of sacral region, stage 2: Secondary | ICD-10-CM | POA: Diagnosis not present

## 2016-12-17 DIAGNOSIS — J441 Chronic obstructive pulmonary disease with (acute) exacerbation: Secondary | ICD-10-CM | POA: Diagnosis not present

## 2016-12-17 DIAGNOSIS — R41841 Cognitive communication deficit: Secondary | ICD-10-CM | POA: Diagnosis not present

## 2016-12-21 DIAGNOSIS — I110 Hypertensive heart disease with heart failure: Secondary | ICD-10-CM | POA: Diagnosis not present

## 2016-12-21 DIAGNOSIS — R41841 Cognitive communication deficit: Secondary | ICD-10-CM | POA: Diagnosis not present

## 2016-12-21 DIAGNOSIS — L89610 Pressure ulcer of right heel, unstageable: Secondary | ICD-10-CM | POA: Diagnosis not present

## 2016-12-21 DIAGNOSIS — J441 Chronic obstructive pulmonary disease with (acute) exacerbation: Secondary | ICD-10-CM | POA: Diagnosis not present

## 2016-12-21 DIAGNOSIS — L89152 Pressure ulcer of sacral region, stage 2: Secondary | ICD-10-CM | POA: Diagnosis not present

## 2016-12-21 DIAGNOSIS — I5032 Chronic diastolic (congestive) heart failure: Secondary | ICD-10-CM | POA: Diagnosis not present

## 2016-12-24 DIAGNOSIS — L89152 Pressure ulcer of sacral region, stage 2: Secondary | ICD-10-CM | POA: Diagnosis not present

## 2016-12-24 DIAGNOSIS — J441 Chronic obstructive pulmonary disease with (acute) exacerbation: Secondary | ICD-10-CM | POA: Diagnosis not present

## 2016-12-24 DIAGNOSIS — L89610 Pressure ulcer of right heel, unstageable: Secondary | ICD-10-CM | POA: Diagnosis not present

## 2016-12-24 DIAGNOSIS — I110 Hypertensive heart disease with heart failure: Secondary | ICD-10-CM | POA: Diagnosis not present

## 2016-12-24 DIAGNOSIS — R41841 Cognitive communication deficit: Secondary | ICD-10-CM | POA: Diagnosis not present

## 2016-12-24 DIAGNOSIS — I5032 Chronic diastolic (congestive) heart failure: Secondary | ICD-10-CM | POA: Diagnosis not present

## 2016-12-26 DIAGNOSIS — I5032 Chronic diastolic (congestive) heart failure: Secondary | ICD-10-CM | POA: Diagnosis not present

## 2016-12-26 DIAGNOSIS — J441 Chronic obstructive pulmonary disease with (acute) exacerbation: Secondary | ICD-10-CM | POA: Diagnosis not present

## 2016-12-26 DIAGNOSIS — L89610 Pressure ulcer of right heel, unstageable: Secondary | ICD-10-CM | POA: Diagnosis not present

## 2016-12-26 DIAGNOSIS — R41841 Cognitive communication deficit: Secondary | ICD-10-CM | POA: Diagnosis not present

## 2016-12-26 DIAGNOSIS — L89152 Pressure ulcer of sacral region, stage 2: Secondary | ICD-10-CM | POA: Diagnosis not present

## 2016-12-26 DIAGNOSIS — I110 Hypertensive heart disease with heart failure: Secondary | ICD-10-CM | POA: Diagnosis not present

## 2016-12-28 DIAGNOSIS — I110 Hypertensive heart disease with heart failure: Secondary | ICD-10-CM | POA: Diagnosis not present

## 2016-12-28 DIAGNOSIS — L89152 Pressure ulcer of sacral region, stage 2: Secondary | ICD-10-CM | POA: Diagnosis not present

## 2016-12-28 DIAGNOSIS — L89610 Pressure ulcer of right heel, unstageable: Secondary | ICD-10-CM | POA: Diagnosis not present

## 2016-12-28 DIAGNOSIS — R41841 Cognitive communication deficit: Secondary | ICD-10-CM | POA: Diagnosis not present

## 2016-12-28 DIAGNOSIS — I5032 Chronic diastolic (congestive) heart failure: Secondary | ICD-10-CM | POA: Diagnosis not present

## 2016-12-28 DIAGNOSIS — J441 Chronic obstructive pulmonary disease with (acute) exacerbation: Secondary | ICD-10-CM | POA: Diagnosis not present

## 2016-12-29 DIAGNOSIS — L89152 Pressure ulcer of sacral region, stage 2: Secondary | ICD-10-CM | POA: Diagnosis not present

## 2016-12-29 DIAGNOSIS — I110 Hypertensive heart disease with heart failure: Secondary | ICD-10-CM | POA: Diagnosis not present

## 2016-12-29 DIAGNOSIS — L89610 Pressure ulcer of right heel, unstageable: Secondary | ICD-10-CM | POA: Diagnosis not present

## 2016-12-29 DIAGNOSIS — R41841 Cognitive communication deficit: Secondary | ICD-10-CM | POA: Diagnosis not present

## 2016-12-29 DIAGNOSIS — I5032 Chronic diastolic (congestive) heart failure: Secondary | ICD-10-CM | POA: Diagnosis not present

## 2016-12-29 DIAGNOSIS — J441 Chronic obstructive pulmonary disease with (acute) exacerbation: Secondary | ICD-10-CM | POA: Diagnosis not present

## 2016-12-31 DIAGNOSIS — J441 Chronic obstructive pulmonary disease with (acute) exacerbation: Secondary | ICD-10-CM | POA: Diagnosis not present

## 2016-12-31 DIAGNOSIS — R41841 Cognitive communication deficit: Secondary | ICD-10-CM | POA: Diagnosis not present

## 2016-12-31 DIAGNOSIS — I110 Hypertensive heart disease with heart failure: Secondary | ICD-10-CM | POA: Diagnosis not present

## 2016-12-31 DIAGNOSIS — L89610 Pressure ulcer of right heel, unstageable: Secondary | ICD-10-CM | POA: Diagnosis not present

## 2016-12-31 DIAGNOSIS — L89152 Pressure ulcer of sacral region, stage 2: Secondary | ICD-10-CM | POA: Diagnosis not present

## 2016-12-31 DIAGNOSIS — I5032 Chronic diastolic (congestive) heart failure: Secondary | ICD-10-CM | POA: Diagnosis not present

## 2017-01-04 DIAGNOSIS — L89152 Pressure ulcer of sacral region, stage 2: Secondary | ICD-10-CM | POA: Diagnosis not present

## 2017-01-04 DIAGNOSIS — L89610 Pressure ulcer of right heel, unstageable: Secondary | ICD-10-CM | POA: Diagnosis not present

## 2017-01-04 DIAGNOSIS — I110 Hypertensive heart disease with heart failure: Secondary | ICD-10-CM | POA: Diagnosis not present

## 2017-01-04 DIAGNOSIS — J441 Chronic obstructive pulmonary disease with (acute) exacerbation: Secondary | ICD-10-CM | POA: Diagnosis not present

## 2017-01-04 DIAGNOSIS — I5032 Chronic diastolic (congestive) heart failure: Secondary | ICD-10-CM | POA: Diagnosis not present

## 2017-01-04 DIAGNOSIS — R41841 Cognitive communication deficit: Secondary | ICD-10-CM | POA: Diagnosis not present

## 2017-01-07 DIAGNOSIS — L89152 Pressure ulcer of sacral region, stage 2: Secondary | ICD-10-CM | POA: Diagnosis not present

## 2017-01-07 DIAGNOSIS — J441 Chronic obstructive pulmonary disease with (acute) exacerbation: Secondary | ICD-10-CM | POA: Diagnosis not present

## 2017-01-07 DIAGNOSIS — R41841 Cognitive communication deficit: Secondary | ICD-10-CM | POA: Diagnosis not present

## 2017-01-07 DIAGNOSIS — I5032 Chronic diastolic (congestive) heart failure: Secondary | ICD-10-CM | POA: Diagnosis not present

## 2017-01-07 DIAGNOSIS — L89610 Pressure ulcer of right heel, unstageable: Secondary | ICD-10-CM | POA: Diagnosis not present

## 2017-01-07 DIAGNOSIS — I110 Hypertensive heart disease with heart failure: Secondary | ICD-10-CM | POA: Diagnosis not present

## 2017-01-08 DIAGNOSIS — I110 Hypertensive heart disease with heart failure: Secondary | ICD-10-CM | POA: Diagnosis not present

## 2017-01-08 DIAGNOSIS — J441 Chronic obstructive pulmonary disease with (acute) exacerbation: Secondary | ICD-10-CM | POA: Diagnosis not present

## 2017-01-08 DIAGNOSIS — R41841 Cognitive communication deficit: Secondary | ICD-10-CM | POA: Diagnosis not present

## 2017-01-08 DIAGNOSIS — I5032 Chronic diastolic (congestive) heart failure: Secondary | ICD-10-CM | POA: Diagnosis not present

## 2017-01-08 DIAGNOSIS — L89610 Pressure ulcer of right heel, unstageable: Secondary | ICD-10-CM | POA: Diagnosis not present

## 2017-01-08 DIAGNOSIS — L89152 Pressure ulcer of sacral region, stage 2: Secondary | ICD-10-CM | POA: Diagnosis not present

## 2017-01-12 DIAGNOSIS — R41841 Cognitive communication deficit: Secondary | ICD-10-CM | POA: Diagnosis not present

## 2017-01-12 DIAGNOSIS — I5032 Chronic diastolic (congestive) heart failure: Secondary | ICD-10-CM | POA: Diagnosis not present

## 2017-01-12 DIAGNOSIS — J441 Chronic obstructive pulmonary disease with (acute) exacerbation: Secondary | ICD-10-CM | POA: Diagnosis not present

## 2017-01-12 DIAGNOSIS — L89152 Pressure ulcer of sacral region, stage 2: Secondary | ICD-10-CM | POA: Diagnosis not present

## 2017-01-12 DIAGNOSIS — I110 Hypertensive heart disease with heart failure: Secondary | ICD-10-CM | POA: Diagnosis not present

## 2017-01-12 DIAGNOSIS — L89610 Pressure ulcer of right heel, unstageable: Secondary | ICD-10-CM | POA: Diagnosis not present

## 2017-01-13 DIAGNOSIS — R41841 Cognitive communication deficit: Secondary | ICD-10-CM | POA: Diagnosis not present

## 2017-01-13 DIAGNOSIS — I5032 Chronic diastolic (congestive) heart failure: Secondary | ICD-10-CM | POA: Diagnosis not present

## 2017-01-13 DIAGNOSIS — I4891 Unspecified atrial fibrillation: Secondary | ICD-10-CM | POA: Diagnosis not present

## 2017-01-13 DIAGNOSIS — J441 Chronic obstructive pulmonary disease with (acute) exacerbation: Secondary | ICD-10-CM | POA: Diagnosis not present

## 2017-01-13 DIAGNOSIS — I110 Hypertensive heart disease with heart failure: Secondary | ICD-10-CM | POA: Diagnosis not present

## 2017-01-13 DIAGNOSIS — L89613 Pressure ulcer of right heel, stage 3: Secondary | ICD-10-CM | POA: Diagnosis not present

## 2017-01-14 DIAGNOSIS — R41841 Cognitive communication deficit: Secondary | ICD-10-CM | POA: Diagnosis not present

## 2017-01-14 DIAGNOSIS — L89613 Pressure ulcer of right heel, stage 3: Secondary | ICD-10-CM | POA: Diagnosis not present

## 2017-01-14 DIAGNOSIS — J441 Chronic obstructive pulmonary disease with (acute) exacerbation: Secondary | ICD-10-CM | POA: Diagnosis not present

## 2017-01-14 DIAGNOSIS — I110 Hypertensive heart disease with heart failure: Secondary | ICD-10-CM | POA: Diagnosis not present

## 2017-01-14 DIAGNOSIS — I5032 Chronic diastolic (congestive) heart failure: Secondary | ICD-10-CM | POA: Diagnosis not present

## 2017-01-14 DIAGNOSIS — I4891 Unspecified atrial fibrillation: Secondary | ICD-10-CM | POA: Diagnosis not present

## 2017-01-15 DIAGNOSIS — J441 Chronic obstructive pulmonary disease with (acute) exacerbation: Secondary | ICD-10-CM | POA: Diagnosis not present

## 2017-01-15 DIAGNOSIS — I110 Hypertensive heart disease with heart failure: Secondary | ICD-10-CM | POA: Diagnosis not present

## 2017-01-15 DIAGNOSIS — I4891 Unspecified atrial fibrillation: Secondary | ICD-10-CM | POA: Diagnosis not present

## 2017-01-15 DIAGNOSIS — L89613 Pressure ulcer of right heel, stage 3: Secondary | ICD-10-CM | POA: Diagnosis not present

## 2017-01-15 DIAGNOSIS — I5032 Chronic diastolic (congestive) heart failure: Secondary | ICD-10-CM | POA: Diagnosis not present

## 2017-01-15 DIAGNOSIS — R41841 Cognitive communication deficit: Secondary | ICD-10-CM | POA: Diagnosis not present

## 2017-01-16 DIAGNOSIS — I110 Hypertensive heart disease with heart failure: Secondary | ICD-10-CM | POA: Diagnosis not present

## 2017-01-16 DIAGNOSIS — I4891 Unspecified atrial fibrillation: Secondary | ICD-10-CM | POA: Diagnosis not present

## 2017-01-16 DIAGNOSIS — J441 Chronic obstructive pulmonary disease with (acute) exacerbation: Secondary | ICD-10-CM | POA: Diagnosis not present

## 2017-01-16 DIAGNOSIS — R41841 Cognitive communication deficit: Secondary | ICD-10-CM | POA: Diagnosis not present

## 2017-01-16 DIAGNOSIS — I5032 Chronic diastolic (congestive) heart failure: Secondary | ICD-10-CM | POA: Diagnosis not present

## 2017-01-16 DIAGNOSIS — L89613 Pressure ulcer of right heel, stage 3: Secondary | ICD-10-CM | POA: Diagnosis not present

## 2017-01-18 DIAGNOSIS — I110 Hypertensive heart disease with heart failure: Secondary | ICD-10-CM | POA: Diagnosis not present

## 2017-01-18 DIAGNOSIS — J441 Chronic obstructive pulmonary disease with (acute) exacerbation: Secondary | ICD-10-CM | POA: Diagnosis not present

## 2017-01-18 DIAGNOSIS — L89613 Pressure ulcer of right heel, stage 3: Secondary | ICD-10-CM | POA: Diagnosis not present

## 2017-01-18 DIAGNOSIS — R41841 Cognitive communication deficit: Secondary | ICD-10-CM | POA: Diagnosis not present

## 2017-01-18 DIAGNOSIS — I5032 Chronic diastolic (congestive) heart failure: Secondary | ICD-10-CM | POA: Diagnosis not present

## 2017-01-18 DIAGNOSIS — I4891 Unspecified atrial fibrillation: Secondary | ICD-10-CM | POA: Diagnosis not present

## 2017-01-21 DIAGNOSIS — I110 Hypertensive heart disease with heart failure: Secondary | ICD-10-CM | POA: Diagnosis not present

## 2017-01-21 DIAGNOSIS — I4891 Unspecified atrial fibrillation: Secondary | ICD-10-CM | POA: Diagnosis not present

## 2017-01-21 DIAGNOSIS — R41841 Cognitive communication deficit: Secondary | ICD-10-CM | POA: Diagnosis not present

## 2017-01-21 DIAGNOSIS — L89613 Pressure ulcer of right heel, stage 3: Secondary | ICD-10-CM | POA: Diagnosis not present

## 2017-01-21 DIAGNOSIS — J441 Chronic obstructive pulmonary disease with (acute) exacerbation: Secondary | ICD-10-CM | POA: Diagnosis not present

## 2017-01-21 DIAGNOSIS — I5032 Chronic diastolic (congestive) heart failure: Secondary | ICD-10-CM | POA: Diagnosis not present

## 2017-01-23 DIAGNOSIS — R41841 Cognitive communication deficit: Secondary | ICD-10-CM | POA: Diagnosis not present

## 2017-01-23 DIAGNOSIS — J441 Chronic obstructive pulmonary disease with (acute) exacerbation: Secondary | ICD-10-CM | POA: Diagnosis not present

## 2017-01-23 DIAGNOSIS — I5032 Chronic diastolic (congestive) heart failure: Secondary | ICD-10-CM | POA: Diagnosis not present

## 2017-01-23 DIAGNOSIS — L89613 Pressure ulcer of right heel, stage 3: Secondary | ICD-10-CM | POA: Diagnosis not present

## 2017-01-23 DIAGNOSIS — I4891 Unspecified atrial fibrillation: Secondary | ICD-10-CM | POA: Diagnosis not present

## 2017-01-23 DIAGNOSIS — I110 Hypertensive heart disease with heart failure: Secondary | ICD-10-CM | POA: Diagnosis not present

## 2017-01-25 DIAGNOSIS — I110 Hypertensive heart disease with heart failure: Secondary | ICD-10-CM | POA: Diagnosis not present

## 2017-01-25 DIAGNOSIS — I4891 Unspecified atrial fibrillation: Secondary | ICD-10-CM | POA: Diagnosis not present

## 2017-01-25 DIAGNOSIS — R41841 Cognitive communication deficit: Secondary | ICD-10-CM | POA: Diagnosis not present

## 2017-01-25 DIAGNOSIS — L89613 Pressure ulcer of right heel, stage 3: Secondary | ICD-10-CM | POA: Diagnosis not present

## 2017-01-25 DIAGNOSIS — J441 Chronic obstructive pulmonary disease with (acute) exacerbation: Secondary | ICD-10-CM | POA: Diagnosis not present

## 2017-01-25 DIAGNOSIS — I5032 Chronic diastolic (congestive) heart failure: Secondary | ICD-10-CM | POA: Diagnosis not present

## 2017-01-28 DIAGNOSIS — L89613 Pressure ulcer of right heel, stage 3: Secondary | ICD-10-CM | POA: Diagnosis not present

## 2017-01-28 DIAGNOSIS — J441 Chronic obstructive pulmonary disease with (acute) exacerbation: Secondary | ICD-10-CM | POA: Diagnosis not present

## 2017-01-28 DIAGNOSIS — R41841 Cognitive communication deficit: Secondary | ICD-10-CM | POA: Diagnosis not present

## 2017-01-28 DIAGNOSIS — I110 Hypertensive heart disease with heart failure: Secondary | ICD-10-CM | POA: Diagnosis not present

## 2017-01-28 DIAGNOSIS — I5032 Chronic diastolic (congestive) heart failure: Secondary | ICD-10-CM | POA: Diagnosis not present

## 2017-01-28 DIAGNOSIS — I4891 Unspecified atrial fibrillation: Secondary | ICD-10-CM | POA: Diagnosis not present

## 2017-01-29 DIAGNOSIS — I4891 Unspecified atrial fibrillation: Secondary | ICD-10-CM | POA: Diagnosis not present

## 2017-01-29 DIAGNOSIS — J441 Chronic obstructive pulmonary disease with (acute) exacerbation: Secondary | ICD-10-CM | POA: Diagnosis not present

## 2017-01-29 DIAGNOSIS — I110 Hypertensive heart disease with heart failure: Secondary | ICD-10-CM | POA: Diagnosis not present

## 2017-01-29 DIAGNOSIS — R41841 Cognitive communication deficit: Secondary | ICD-10-CM | POA: Diagnosis not present

## 2017-01-29 DIAGNOSIS — I5032 Chronic diastolic (congestive) heart failure: Secondary | ICD-10-CM | POA: Diagnosis not present

## 2017-01-29 DIAGNOSIS — L89613 Pressure ulcer of right heel, stage 3: Secondary | ICD-10-CM | POA: Diagnosis not present

## 2017-02-01 DIAGNOSIS — I5032 Chronic diastolic (congestive) heart failure: Secondary | ICD-10-CM | POA: Diagnosis not present

## 2017-02-01 DIAGNOSIS — I4891 Unspecified atrial fibrillation: Secondary | ICD-10-CM | POA: Diagnosis not present

## 2017-02-01 DIAGNOSIS — R41841 Cognitive communication deficit: Secondary | ICD-10-CM | POA: Diagnosis not present

## 2017-02-01 DIAGNOSIS — I110 Hypertensive heart disease with heart failure: Secondary | ICD-10-CM | POA: Diagnosis not present

## 2017-02-01 DIAGNOSIS — L89613 Pressure ulcer of right heel, stage 3: Secondary | ICD-10-CM | POA: Diagnosis not present

## 2017-02-01 DIAGNOSIS — J441 Chronic obstructive pulmonary disease with (acute) exacerbation: Secondary | ICD-10-CM | POA: Diagnosis not present

## 2017-02-02 DIAGNOSIS — J441 Chronic obstructive pulmonary disease with (acute) exacerbation: Secondary | ICD-10-CM | POA: Diagnosis not present

## 2017-02-02 DIAGNOSIS — L89613 Pressure ulcer of right heel, stage 3: Secondary | ICD-10-CM | POA: Diagnosis not present

## 2017-02-02 DIAGNOSIS — I4891 Unspecified atrial fibrillation: Secondary | ICD-10-CM | POA: Diagnosis not present

## 2017-02-02 DIAGNOSIS — R41841 Cognitive communication deficit: Secondary | ICD-10-CM | POA: Diagnosis not present

## 2017-02-02 DIAGNOSIS — I5032 Chronic diastolic (congestive) heart failure: Secondary | ICD-10-CM | POA: Diagnosis not present

## 2017-02-02 DIAGNOSIS — I110 Hypertensive heart disease with heart failure: Secondary | ICD-10-CM | POA: Diagnosis not present

## 2017-02-04 DIAGNOSIS — J441 Chronic obstructive pulmonary disease with (acute) exacerbation: Secondary | ICD-10-CM | POA: Diagnosis not present

## 2017-02-04 DIAGNOSIS — I110 Hypertensive heart disease with heart failure: Secondary | ICD-10-CM | POA: Diagnosis not present

## 2017-02-04 DIAGNOSIS — R41841 Cognitive communication deficit: Secondary | ICD-10-CM | POA: Diagnosis not present

## 2017-02-04 DIAGNOSIS — L89613 Pressure ulcer of right heel, stage 3: Secondary | ICD-10-CM | POA: Diagnosis not present

## 2017-02-04 DIAGNOSIS — I5032 Chronic diastolic (congestive) heart failure: Secondary | ICD-10-CM | POA: Diagnosis not present

## 2017-02-04 DIAGNOSIS — I4891 Unspecified atrial fibrillation: Secondary | ICD-10-CM | POA: Diagnosis not present

## 2017-02-05 DIAGNOSIS — I5032 Chronic diastolic (congestive) heart failure: Secondary | ICD-10-CM | POA: Diagnosis not present

## 2017-02-05 DIAGNOSIS — I4891 Unspecified atrial fibrillation: Secondary | ICD-10-CM | POA: Diagnosis not present

## 2017-02-05 DIAGNOSIS — R41841 Cognitive communication deficit: Secondary | ICD-10-CM | POA: Diagnosis not present

## 2017-02-05 DIAGNOSIS — I110 Hypertensive heart disease with heart failure: Secondary | ICD-10-CM | POA: Diagnosis not present

## 2017-02-05 DIAGNOSIS — L89613 Pressure ulcer of right heel, stage 3: Secondary | ICD-10-CM | POA: Diagnosis not present

## 2017-02-05 DIAGNOSIS — J441 Chronic obstructive pulmonary disease with (acute) exacerbation: Secondary | ICD-10-CM | POA: Diagnosis not present

## 2017-02-09 DIAGNOSIS — I110 Hypertensive heart disease with heart failure: Secondary | ICD-10-CM | POA: Diagnosis not present

## 2017-02-09 DIAGNOSIS — L89613 Pressure ulcer of right heel, stage 3: Secondary | ICD-10-CM | POA: Diagnosis not present

## 2017-02-09 DIAGNOSIS — I4891 Unspecified atrial fibrillation: Secondary | ICD-10-CM | POA: Diagnosis not present

## 2017-02-09 DIAGNOSIS — I5032 Chronic diastolic (congestive) heart failure: Secondary | ICD-10-CM | POA: Diagnosis not present

## 2017-02-09 DIAGNOSIS — R41841 Cognitive communication deficit: Secondary | ICD-10-CM | POA: Diagnosis not present

## 2017-02-09 DIAGNOSIS — J441 Chronic obstructive pulmonary disease with (acute) exacerbation: Secondary | ICD-10-CM | POA: Diagnosis not present

## 2017-02-11 DIAGNOSIS — J441 Chronic obstructive pulmonary disease with (acute) exacerbation: Secondary | ICD-10-CM | POA: Diagnosis not present

## 2017-02-11 DIAGNOSIS — I5032 Chronic diastolic (congestive) heart failure: Secondary | ICD-10-CM | POA: Diagnosis not present

## 2017-02-11 DIAGNOSIS — I4891 Unspecified atrial fibrillation: Secondary | ICD-10-CM | POA: Diagnosis not present

## 2017-02-11 DIAGNOSIS — I110 Hypertensive heart disease with heart failure: Secondary | ICD-10-CM | POA: Diagnosis not present

## 2017-02-11 DIAGNOSIS — L89613 Pressure ulcer of right heel, stage 3: Secondary | ICD-10-CM | POA: Diagnosis not present

## 2017-02-11 DIAGNOSIS — R41841 Cognitive communication deficit: Secondary | ICD-10-CM | POA: Diagnosis not present

## 2017-02-12 DIAGNOSIS — J441 Chronic obstructive pulmonary disease with (acute) exacerbation: Secondary | ICD-10-CM | POA: Diagnosis not present

## 2017-02-12 DIAGNOSIS — I4891 Unspecified atrial fibrillation: Secondary | ICD-10-CM | POA: Diagnosis not present

## 2017-02-12 DIAGNOSIS — L89613 Pressure ulcer of right heel, stage 3: Secondary | ICD-10-CM | POA: Diagnosis not present

## 2017-02-12 DIAGNOSIS — R41841 Cognitive communication deficit: Secondary | ICD-10-CM | POA: Diagnosis not present

## 2017-02-12 DIAGNOSIS — I110 Hypertensive heart disease with heart failure: Secondary | ICD-10-CM | POA: Diagnosis not present

## 2017-02-12 DIAGNOSIS — I5032 Chronic diastolic (congestive) heart failure: Secondary | ICD-10-CM | POA: Diagnosis not present

## 2017-02-16 DIAGNOSIS — J441 Chronic obstructive pulmonary disease with (acute) exacerbation: Secondary | ICD-10-CM | POA: Diagnosis not present

## 2017-02-16 DIAGNOSIS — I110 Hypertensive heart disease with heart failure: Secondary | ICD-10-CM | POA: Diagnosis not present

## 2017-02-16 DIAGNOSIS — I5032 Chronic diastolic (congestive) heart failure: Secondary | ICD-10-CM | POA: Diagnosis not present

## 2017-02-16 DIAGNOSIS — I4891 Unspecified atrial fibrillation: Secondary | ICD-10-CM | POA: Diagnosis not present

## 2017-02-16 DIAGNOSIS — L89613 Pressure ulcer of right heel, stage 3: Secondary | ICD-10-CM | POA: Diagnosis not present

## 2017-02-16 DIAGNOSIS — R41841 Cognitive communication deficit: Secondary | ICD-10-CM | POA: Diagnosis not present

## 2017-02-18 DIAGNOSIS — I5032 Chronic diastolic (congestive) heart failure: Secondary | ICD-10-CM | POA: Diagnosis not present

## 2017-02-18 DIAGNOSIS — R41841 Cognitive communication deficit: Secondary | ICD-10-CM | POA: Diagnosis not present

## 2017-02-18 DIAGNOSIS — L89613 Pressure ulcer of right heel, stage 3: Secondary | ICD-10-CM | POA: Diagnosis not present

## 2017-02-18 DIAGNOSIS — J441 Chronic obstructive pulmonary disease with (acute) exacerbation: Secondary | ICD-10-CM | POA: Diagnosis not present

## 2017-02-18 DIAGNOSIS — I110 Hypertensive heart disease with heart failure: Secondary | ICD-10-CM | POA: Diagnosis not present

## 2017-02-18 DIAGNOSIS — I4891 Unspecified atrial fibrillation: Secondary | ICD-10-CM | POA: Diagnosis not present

## 2017-02-19 DIAGNOSIS — R41841 Cognitive communication deficit: Secondary | ICD-10-CM | POA: Diagnosis not present

## 2017-02-19 DIAGNOSIS — I110 Hypertensive heart disease with heart failure: Secondary | ICD-10-CM | POA: Diagnosis not present

## 2017-02-19 DIAGNOSIS — L89613 Pressure ulcer of right heel, stage 3: Secondary | ICD-10-CM | POA: Diagnosis not present

## 2017-02-19 DIAGNOSIS — I4891 Unspecified atrial fibrillation: Secondary | ICD-10-CM | POA: Diagnosis not present

## 2017-02-19 DIAGNOSIS — I5032 Chronic diastolic (congestive) heart failure: Secondary | ICD-10-CM | POA: Diagnosis not present

## 2017-02-19 DIAGNOSIS — J441 Chronic obstructive pulmonary disease with (acute) exacerbation: Secondary | ICD-10-CM | POA: Diagnosis not present

## 2017-02-20 DIAGNOSIS — I4891 Unspecified atrial fibrillation: Secondary | ICD-10-CM | POA: Diagnosis not present

## 2017-02-20 DIAGNOSIS — J441 Chronic obstructive pulmonary disease with (acute) exacerbation: Secondary | ICD-10-CM | POA: Diagnosis not present

## 2017-02-20 DIAGNOSIS — I5032 Chronic diastolic (congestive) heart failure: Secondary | ICD-10-CM | POA: Diagnosis not present

## 2017-02-20 DIAGNOSIS — R41841 Cognitive communication deficit: Secondary | ICD-10-CM | POA: Diagnosis not present

## 2017-02-20 DIAGNOSIS — I110 Hypertensive heart disease with heart failure: Secondary | ICD-10-CM | POA: Diagnosis not present

## 2017-02-20 DIAGNOSIS — L89613 Pressure ulcer of right heel, stage 3: Secondary | ICD-10-CM | POA: Diagnosis not present

## 2017-02-22 DIAGNOSIS — I110 Hypertensive heart disease with heart failure: Secondary | ICD-10-CM | POA: Diagnosis not present

## 2017-02-22 DIAGNOSIS — I4891 Unspecified atrial fibrillation: Secondary | ICD-10-CM | POA: Diagnosis not present

## 2017-02-22 DIAGNOSIS — J441 Chronic obstructive pulmonary disease with (acute) exacerbation: Secondary | ICD-10-CM | POA: Diagnosis not present

## 2017-02-22 DIAGNOSIS — G894 Chronic pain syndrome: Secondary | ICD-10-CM | POA: Diagnosis not present

## 2017-02-22 DIAGNOSIS — R41841 Cognitive communication deficit: Secondary | ICD-10-CM | POA: Diagnosis not present

## 2017-02-22 DIAGNOSIS — L89613 Pressure ulcer of right heel, stage 3: Secondary | ICD-10-CM | POA: Diagnosis not present

## 2017-02-22 DIAGNOSIS — I5032 Chronic diastolic (congestive) heart failure: Secondary | ICD-10-CM | POA: Diagnosis not present

## 2017-02-23 DIAGNOSIS — R41841 Cognitive communication deficit: Secondary | ICD-10-CM | POA: Diagnosis not present

## 2017-02-23 DIAGNOSIS — I4891 Unspecified atrial fibrillation: Secondary | ICD-10-CM | POA: Diagnosis not present

## 2017-02-23 DIAGNOSIS — J441 Chronic obstructive pulmonary disease with (acute) exacerbation: Secondary | ICD-10-CM | POA: Diagnosis not present

## 2017-02-23 DIAGNOSIS — L89613 Pressure ulcer of right heel, stage 3: Secondary | ICD-10-CM | POA: Diagnosis not present

## 2017-02-23 DIAGNOSIS — I5032 Chronic diastolic (congestive) heart failure: Secondary | ICD-10-CM | POA: Diagnosis not present

## 2017-02-23 DIAGNOSIS — I110 Hypertensive heart disease with heart failure: Secondary | ICD-10-CM | POA: Diagnosis not present

## 2017-02-25 DIAGNOSIS — J441 Chronic obstructive pulmonary disease with (acute) exacerbation: Secondary | ICD-10-CM | POA: Diagnosis not present

## 2017-02-25 DIAGNOSIS — R41841 Cognitive communication deficit: Secondary | ICD-10-CM | POA: Diagnosis not present

## 2017-02-25 DIAGNOSIS — I4891 Unspecified atrial fibrillation: Secondary | ICD-10-CM | POA: Diagnosis not present

## 2017-02-25 DIAGNOSIS — L89613 Pressure ulcer of right heel, stage 3: Secondary | ICD-10-CM | POA: Diagnosis not present

## 2017-02-25 DIAGNOSIS — I110 Hypertensive heart disease with heart failure: Secondary | ICD-10-CM | POA: Diagnosis not present

## 2017-02-25 DIAGNOSIS — I5032 Chronic diastolic (congestive) heart failure: Secondary | ICD-10-CM | POA: Diagnosis not present

## 2017-02-26 DIAGNOSIS — I110 Hypertensive heart disease with heart failure: Secondary | ICD-10-CM | POA: Diagnosis not present

## 2017-02-26 DIAGNOSIS — R41841 Cognitive communication deficit: Secondary | ICD-10-CM | POA: Diagnosis not present

## 2017-02-26 DIAGNOSIS — I5032 Chronic diastolic (congestive) heart failure: Secondary | ICD-10-CM | POA: Diagnosis not present

## 2017-02-26 DIAGNOSIS — J441 Chronic obstructive pulmonary disease with (acute) exacerbation: Secondary | ICD-10-CM | POA: Diagnosis not present

## 2017-02-26 DIAGNOSIS — I4891 Unspecified atrial fibrillation: Secondary | ICD-10-CM | POA: Diagnosis not present

## 2017-02-26 DIAGNOSIS — L89613 Pressure ulcer of right heel, stage 3: Secondary | ICD-10-CM | POA: Diagnosis not present

## 2017-02-27 DIAGNOSIS — I4891 Unspecified atrial fibrillation: Secondary | ICD-10-CM | POA: Diagnosis not present

## 2017-02-27 DIAGNOSIS — L89613 Pressure ulcer of right heel, stage 3: Secondary | ICD-10-CM | POA: Diagnosis not present

## 2017-02-27 DIAGNOSIS — I5032 Chronic diastolic (congestive) heart failure: Secondary | ICD-10-CM | POA: Diagnosis not present

## 2017-02-27 DIAGNOSIS — I110 Hypertensive heart disease with heart failure: Secondary | ICD-10-CM | POA: Diagnosis not present

## 2017-02-27 DIAGNOSIS — R41841 Cognitive communication deficit: Secondary | ICD-10-CM | POA: Diagnosis not present

## 2017-02-27 DIAGNOSIS — J441 Chronic obstructive pulmonary disease with (acute) exacerbation: Secondary | ICD-10-CM | POA: Diagnosis not present

## 2017-03-01 DIAGNOSIS — R41841 Cognitive communication deficit: Secondary | ICD-10-CM | POA: Diagnosis not present

## 2017-03-01 DIAGNOSIS — J441 Chronic obstructive pulmonary disease with (acute) exacerbation: Secondary | ICD-10-CM | POA: Diagnosis not present

## 2017-03-01 DIAGNOSIS — I110 Hypertensive heart disease with heart failure: Secondary | ICD-10-CM | POA: Diagnosis not present

## 2017-03-01 DIAGNOSIS — L89613 Pressure ulcer of right heel, stage 3: Secondary | ICD-10-CM | POA: Diagnosis not present

## 2017-03-01 DIAGNOSIS — I5032 Chronic diastolic (congestive) heart failure: Secondary | ICD-10-CM | POA: Diagnosis not present

## 2017-03-01 DIAGNOSIS — I4891 Unspecified atrial fibrillation: Secondary | ICD-10-CM | POA: Diagnosis not present

## 2017-03-02 DIAGNOSIS — I5032 Chronic diastolic (congestive) heart failure: Secondary | ICD-10-CM | POA: Diagnosis not present

## 2017-03-02 DIAGNOSIS — I110 Hypertensive heart disease with heart failure: Secondary | ICD-10-CM | POA: Diagnosis not present

## 2017-03-02 DIAGNOSIS — L89613 Pressure ulcer of right heel, stage 3: Secondary | ICD-10-CM | POA: Diagnosis not present

## 2017-03-02 DIAGNOSIS — I4891 Unspecified atrial fibrillation: Secondary | ICD-10-CM | POA: Diagnosis not present

## 2017-03-02 DIAGNOSIS — J441 Chronic obstructive pulmonary disease with (acute) exacerbation: Secondary | ICD-10-CM | POA: Diagnosis not present

## 2017-03-02 DIAGNOSIS — R41841 Cognitive communication deficit: Secondary | ICD-10-CM | POA: Diagnosis not present

## 2017-03-03 DIAGNOSIS — I110 Hypertensive heart disease with heart failure: Secondary | ICD-10-CM | POA: Diagnosis not present

## 2017-03-03 DIAGNOSIS — R41841 Cognitive communication deficit: Secondary | ICD-10-CM | POA: Diagnosis not present

## 2017-03-03 DIAGNOSIS — I4891 Unspecified atrial fibrillation: Secondary | ICD-10-CM | POA: Diagnosis not present

## 2017-03-03 DIAGNOSIS — J441 Chronic obstructive pulmonary disease with (acute) exacerbation: Secondary | ICD-10-CM | POA: Diagnosis not present

## 2017-03-03 DIAGNOSIS — L89613 Pressure ulcer of right heel, stage 3: Secondary | ICD-10-CM | POA: Diagnosis not present

## 2017-03-03 DIAGNOSIS — I5032 Chronic diastolic (congestive) heart failure: Secondary | ICD-10-CM | POA: Diagnosis not present

## 2017-03-04 DIAGNOSIS — I110 Hypertensive heart disease with heart failure: Secondary | ICD-10-CM | POA: Diagnosis not present

## 2017-03-04 DIAGNOSIS — J441 Chronic obstructive pulmonary disease with (acute) exacerbation: Secondary | ICD-10-CM | POA: Diagnosis not present

## 2017-03-04 DIAGNOSIS — R41841 Cognitive communication deficit: Secondary | ICD-10-CM | POA: Diagnosis not present

## 2017-03-04 DIAGNOSIS — I4891 Unspecified atrial fibrillation: Secondary | ICD-10-CM | POA: Diagnosis not present

## 2017-03-04 DIAGNOSIS — I5032 Chronic diastolic (congestive) heart failure: Secondary | ICD-10-CM | POA: Diagnosis not present

## 2017-03-04 DIAGNOSIS — L89613 Pressure ulcer of right heel, stage 3: Secondary | ICD-10-CM | POA: Diagnosis not present

## 2017-03-06 DIAGNOSIS — I4891 Unspecified atrial fibrillation: Secondary | ICD-10-CM | POA: Diagnosis not present

## 2017-03-06 DIAGNOSIS — I110 Hypertensive heart disease with heart failure: Secondary | ICD-10-CM | POA: Diagnosis not present

## 2017-03-06 DIAGNOSIS — J441 Chronic obstructive pulmonary disease with (acute) exacerbation: Secondary | ICD-10-CM | POA: Diagnosis not present

## 2017-03-06 DIAGNOSIS — R41841 Cognitive communication deficit: Secondary | ICD-10-CM | POA: Diagnosis not present

## 2017-03-06 DIAGNOSIS — I5032 Chronic diastolic (congestive) heart failure: Secondary | ICD-10-CM | POA: Diagnosis not present

## 2017-03-06 DIAGNOSIS — L89613 Pressure ulcer of right heel, stage 3: Secondary | ICD-10-CM | POA: Diagnosis not present

## 2017-03-08 DIAGNOSIS — J441 Chronic obstructive pulmonary disease with (acute) exacerbation: Secondary | ICD-10-CM | POA: Diagnosis not present

## 2017-03-08 DIAGNOSIS — I5032 Chronic diastolic (congestive) heart failure: Secondary | ICD-10-CM | POA: Diagnosis not present

## 2017-03-08 DIAGNOSIS — R41841 Cognitive communication deficit: Secondary | ICD-10-CM | POA: Diagnosis not present

## 2017-03-08 DIAGNOSIS — I110 Hypertensive heart disease with heart failure: Secondary | ICD-10-CM | POA: Diagnosis not present

## 2017-03-08 DIAGNOSIS — L89613 Pressure ulcer of right heel, stage 3: Secondary | ICD-10-CM | POA: Diagnosis not present

## 2017-03-08 DIAGNOSIS — I4891 Unspecified atrial fibrillation: Secondary | ICD-10-CM | POA: Diagnosis not present

## 2017-03-11 DIAGNOSIS — L89613 Pressure ulcer of right heel, stage 3: Secondary | ICD-10-CM | POA: Diagnosis not present

## 2017-03-11 DIAGNOSIS — I4891 Unspecified atrial fibrillation: Secondary | ICD-10-CM | POA: Diagnosis not present

## 2017-03-11 DIAGNOSIS — R41841 Cognitive communication deficit: Secondary | ICD-10-CM | POA: Diagnosis not present

## 2017-03-11 DIAGNOSIS — I5032 Chronic diastolic (congestive) heart failure: Secondary | ICD-10-CM | POA: Diagnosis not present

## 2017-03-11 DIAGNOSIS — I110 Hypertensive heart disease with heart failure: Secondary | ICD-10-CM | POA: Diagnosis not present

## 2017-03-11 DIAGNOSIS — J441 Chronic obstructive pulmonary disease with (acute) exacerbation: Secondary | ICD-10-CM | POA: Diagnosis not present

## 2017-03-13 DIAGNOSIS — R41841 Cognitive communication deficit: Secondary | ICD-10-CM | POA: Diagnosis not present

## 2017-03-13 DIAGNOSIS — J441 Chronic obstructive pulmonary disease with (acute) exacerbation: Secondary | ICD-10-CM | POA: Diagnosis not present

## 2017-03-13 DIAGNOSIS — I5032 Chronic diastolic (congestive) heart failure: Secondary | ICD-10-CM | POA: Diagnosis not present

## 2017-03-13 DIAGNOSIS — I110 Hypertensive heart disease with heart failure: Secondary | ICD-10-CM | POA: Diagnosis not present

## 2017-03-13 DIAGNOSIS — L89613 Pressure ulcer of right heel, stage 3: Secondary | ICD-10-CM | POA: Diagnosis not present

## 2017-03-13 DIAGNOSIS — I4891 Unspecified atrial fibrillation: Secondary | ICD-10-CM | POA: Diagnosis not present

## 2017-03-14 DIAGNOSIS — I110 Hypertensive heart disease with heart failure: Secondary | ICD-10-CM | POA: Diagnosis not present

## 2017-03-14 DIAGNOSIS — R41841 Cognitive communication deficit: Secondary | ICD-10-CM | POA: Diagnosis not present

## 2017-03-14 DIAGNOSIS — I5032 Chronic diastolic (congestive) heart failure: Secondary | ICD-10-CM | POA: Diagnosis not present

## 2017-03-14 DIAGNOSIS — L89613 Pressure ulcer of right heel, stage 3: Secondary | ICD-10-CM | POA: Diagnosis not present

## 2017-03-14 DIAGNOSIS — J441 Chronic obstructive pulmonary disease with (acute) exacerbation: Secondary | ICD-10-CM | POA: Diagnosis not present

## 2017-03-14 DIAGNOSIS — I4891 Unspecified atrial fibrillation: Secondary | ICD-10-CM | POA: Diagnosis not present

## 2017-03-18 DIAGNOSIS — I5032 Chronic diastolic (congestive) heart failure: Secondary | ICD-10-CM | POA: Diagnosis not present

## 2017-03-18 DIAGNOSIS — J441 Chronic obstructive pulmonary disease with (acute) exacerbation: Secondary | ICD-10-CM | POA: Diagnosis not present

## 2017-03-18 DIAGNOSIS — L89613 Pressure ulcer of right heel, stage 3: Secondary | ICD-10-CM | POA: Diagnosis not present

## 2017-03-18 DIAGNOSIS — R41841 Cognitive communication deficit: Secondary | ICD-10-CM | POA: Diagnosis not present

## 2017-03-18 DIAGNOSIS — I110 Hypertensive heart disease with heart failure: Secondary | ICD-10-CM | POA: Diagnosis not present

## 2017-03-18 DIAGNOSIS — I4891 Unspecified atrial fibrillation: Secondary | ICD-10-CM | POA: Diagnosis not present

## 2017-03-21 ENCOUNTER — Encounter (HOSPITAL_COMMUNITY): Payer: Self-pay

## 2017-03-21 ENCOUNTER — Emergency Department (HOSPITAL_COMMUNITY): Payer: Medicare Other

## 2017-03-21 ENCOUNTER — Emergency Department (HOSPITAL_COMMUNITY)
Admission: EM | Admit: 2017-03-21 | Discharge: 2017-03-21 | Disposition: A | Discharge status: Home / Self Care | Payer: Medicare Other | Attending: Emergency Medicine | Admitting: Emergency Medicine

## 2017-03-21 DIAGNOSIS — Z79899 Other long term (current) drug therapy: Secondary | ICD-10-CM | POA: Insufficient documentation

## 2017-03-21 DIAGNOSIS — R031 Nonspecific low blood-pressure reading: Secondary | ICD-10-CM | POA: Diagnosis not present

## 2017-03-21 DIAGNOSIS — J441 Chronic obstructive pulmonary disease with (acute) exacerbation: Secondary | ICD-10-CM | POA: Insufficient documentation

## 2017-03-21 DIAGNOSIS — R0600 Dyspnea, unspecified: Secondary | ICD-10-CM | POA: Insufficient documentation

## 2017-03-21 DIAGNOSIS — E039 Hypothyroidism, unspecified: Secondary | ICD-10-CM | POA: Diagnosis not present

## 2017-03-21 DIAGNOSIS — I5032 Chronic diastolic (congestive) heart failure: Secondary | ICD-10-CM | POA: Diagnosis not present

## 2017-03-21 DIAGNOSIS — J449 Chronic obstructive pulmonary disease, unspecified: Secondary | ICD-10-CM | POA: Diagnosis not present

## 2017-03-21 DIAGNOSIS — R0602 Shortness of breath: Secondary | ICD-10-CM | POA: Diagnosis not present

## 2017-03-21 DIAGNOSIS — R0789 Other chest pain: Secondary | ICD-10-CM | POA: Diagnosis not present

## 2017-03-21 LAB — URINALYSIS, COMPLETE (UACMP) WITH MICROSCOPIC
Bilirubin Urine: NEGATIVE
Glucose, UA: NEGATIVE mg/dL
KETONES UR: NEGATIVE mg/dL
Nitrite: POSITIVE — AB
PH: 6.5 (ref 5.0–8.0)
PROTEIN: 100 mg/dL — AB
Specific Gravity, Urine: 1.025 (ref 1.005–1.030)

## 2017-03-21 LAB — PROTIME-INR
INR: 1.9
Prothrombin Time: 22 seconds — ABNORMAL HIGH (ref 11.4–15.2)

## 2017-03-21 LAB — BASIC METABOLIC PANEL
Anion gap: 6 (ref 5–15)
BUN: 15 mg/dL (ref 6–20)
CALCIUM: 8.8 mg/dL — AB (ref 8.9–10.3)
CO2: 29 mmol/L (ref 22–32)
CREATININE: 0.86 mg/dL (ref 0.44–1.00)
Chloride: 108 mmol/L (ref 101–111)
GFR calc Af Amer: >60 mL/min (ref >60)
GFR, EST NON AFRICAN AMERICAN: 59 mL/min — AB (ref >60)
GLUCOSE: 91 mg/dL (ref 65–99)
Potassium: 4.3 mmol/L (ref 3.5–5.1)
SODIUM: 143 mmol/L (ref 135–145)

## 2017-03-21 LAB — CBC WITH DIFFERENTIAL/PLATELET
BASOS ABS: 0 10*3/uL (ref 0.0–0.1)
BASOS PCT: 0 %
EOS ABS: 0.1 10*3/uL (ref 0.0–0.7)
EOS PCT: 2 %
HEMATOCRIT: 29.9 % — AB (ref 36.0–46.0)
Hemoglobin: 9.6 g/dL — ABNORMAL LOW (ref 12.0–15.0)
Lymphocytes Relative: 9 %
Lymphs Abs: 0.7 10*3/uL (ref 0.7–4.0)
MCH: 29.8 pg (ref 26.0–34.0)
MCHC: 32.1 g/dL (ref 30.0–36.0)
MCV: 92.9 fL (ref 78.0–100.0)
MONO ABS: 0.4 10*3/uL (ref 0.1–1.0)
MONOS PCT: 4 %
Neutro Abs: 6.9 10*3/uL (ref 1.7–7.7)
Neutrophils Relative %: 85 %
PLATELETS: 285 10*3/uL (ref 150–400)
RBC: 3.22 MIL/uL — ABNORMAL LOW (ref 3.87–5.11)
RDW: 16.1 % — AB (ref 11.5–15.5)
WBC: 8.1 10*3/uL (ref 4.0–10.5)

## 2017-03-21 LAB — TROPONIN I

## 2017-03-21 LAB — MAGNESIUM: Magnesium: 2 mg/dL (ref 1.7–2.4)

## 2017-03-21 LAB — BRAIN NATRIURETIC PEPTIDE: B NATRIURETIC PEPTIDE 5: 862.9 pg/mL — AB (ref 0.0–100.0)

## 2017-03-21 MED ORDER — DOXYCYCLINE HYCLATE 100 MG PO CAPS: 100.0000 mg | ORAL_CAPSULE | Freq: Two times a day (BID) | ORAL | 0 refills | Status: DC

## 2017-03-21 MED ORDER — DOXYCYCLINE HYCLATE 100 MG PO TABS
100.0000 mg | ORAL_TABLET | Freq: Once | ORAL | Status: AC
  Administered 2017-03-21: 100 mg via ORAL
  Filled 2017-03-21: qty 1

## 2017-03-21 MED ORDER — ALBUTEROL SULFATE (2.5 MG/3ML) 0.083% IN NEBU
5.0000 mg | INHALATION_SOLUTION | Freq: Once | RESPIRATORY_TRACT | Status: AC
Start: 2017-03-21 — End: 2017-03-21
  Administered 2017-03-21: 5 mg via RESPIRATORY_TRACT
  Filled 2017-03-21: qty 6

## 2017-03-21 MED ORDER — PREDNISONE 10 MG PO TABS: 60.0000 mg | ORAL_TABLET | Freq: Every day | ORAL | 0 refills | Status: DC

## 2017-03-21 MED ORDER — CEPHALEXIN 500 MG PO CAPS
500.0000 mg | ORAL_CAPSULE | Freq: Four times a day (QID) | ORAL | 0 refills | Status: DC
Start: 2017-03-21 — End: 2017-03-31

## 2017-03-21 MED ORDER — DEXAMETHASONE SODIUM PHOSPHATE 10 MG/ML IJ SOLN
10.0000 mg | Freq: Once | INTRAMUSCULAR | Status: AC
  Administered 2017-03-21: 10 mg via INTRAVENOUS
  Filled 2017-03-21: qty 1

## 2017-03-21 MED ORDER — FUROSEMIDE 10 MG/ML IJ SOLN
20.0000 mg | Freq: Once | INTRAMUSCULAR | Status: AC
  Administered 2017-03-21: 20 mg via INTRAVENOUS
  Filled 2017-03-21: qty 4

## 2017-03-21 NOTE — ED Notes (Signed)
She has just left with [facility]. I have just been notified by Dr. Mellody Memos that pt. U/a did show uti. I phoned pt's. Daughter Baker Janus and advised her to go ahead and fill Dr. Luther Hearing prescription for Keflex. She thanked [place] for letting her know.

## 2017-03-21 NOTE — ED Provider Notes (Addendum)
[facility] DEPT Provider Note   CSN: [id number] Arrival date & time: 03/21/17  1349     History   Chief Complaint Chief Complaint  Patient presents with  . Shortness of Breath    HPI [name] is a 83 y.o. female.  HPI 83 y.o.femalewith medical history significant forchronic atrial fibrillation on warfarin, COPD, chronic diastolic CHF, hypothyroidism, hypertension comes in with cc of dib. Per EMS, they were called to the scene with patient having DIB. When they arrived, pt was wheezing and breathing heavily, however, after pt received treatment en route her wheezing stopped. Pt denies chest pain. Pt denies new cough, fevers, chills. Daughter stated that pt was   Past Medical History:  Diagnosis Date  . Atrial fibrillation (HCC)    chronic Coumadin.   Marland Kitchen COPD (chronic obstructive pulmonary disease) ([place])   . Glaucoma   . Hypothyroidism   . Neuropathy    PERIPHERAL  . Osteoarthritis   . Osteoporosis   . Trimalleolar fracture    LEFT TRIMALLEOLAR FRACTURE, DISLOCATION WITH OBLIQUE FRACTURE OF THE DISTAL FIBULAR DIAPHYSIS, AND NOTED BEING MILDLY COMMINUTED    Patient Active Problem List   Diagnosis Date Noted  . Respiratory distress   . Acute systolic heart failure ([place]) 10/09/2016  . Cardiomyopathy ([place]) 10/09/2016  . Goals of care, counseling/discussion   . Palliative care encounter   . Encounter for hospice care discussion   . Dysphagia   . COPD with acute exacerbation ([place]) 10/04/2016  . Sepsis due to pneumonia ([place]) 10/04/2016  . Acute encephalopathy 10/04/2016  . Debility   . CAP (community acquired pneumonia) 10/03/2016  . AKI (acute kidney injury) ([place]) 10/03/2016  . Hypokalemia 10/03/2016  . Normocytic anemia 10/03/2016  . Acute respiratory failure with hypoxia ([place]) 10/03/2016  . Chronic diastolic CHF (congestive heart failure) ([place]) 10/03/2016  . Hypotension 10/03/2016  . Atrial fibrillation ([place])   . Hypothyroidism   . Glaucoma   .  Neuropathy   . Osteoporosis   . Osteoarthritis   . Fall   . Trimalleolar fracture   . HYPOTHYROIDISM 03/02/2007  . GLAUCOMA 03/02/2007  . ATRIAL FIBRILLATION 03/02/2007  . PSORIASIS 03/02/2007    Past Surgical History:  Procedure Laterality Date  . COLONOSCOPY W/ POLYPECTOMY  11/2010   Dr Fuller Plan.  multiple polyps:cecal, transverse, sigmoid.  largest 77mm, path: tubular adenomas without HGD.  mild sigmoid diverticulosis.   Marland Kitchen ORIF TIBIA & FIBULA FRACTURES  2016  . VAGINAL HYSTERECTOMY      OB History    No data available       Home Medications    Prior to Admission medications   Medication Sig Start Date End Date Taking? Authorizing Provider  albuterol (PROVENTIL) (2.5 MG/3ML) 0.083% nebulizer solution Take 2.5 mg by nebulization every 4 (four) hours as needed for wheezing or shortness of breath.    Yes [provider]  dorzolamide (TRUSOPT) 2 % ophthalmic solution Place 1 drop into the left eye 2 (two) times daily. 09/11/16  Yes [provider]  ergocalciferol (VITAMIN D2) 50000 UNITS capsule Take 50,000 Units by mouth every Thursday.    Yes [provider]  FLUoxetine (PROZAC) 20 MG capsule Take 20 mg by mouth daily. 09/11/16  Yes [provider]  furosemide (LASIX) 20 MG tablet Take 1 tablet (20 mg total) by mouth 2 (two) times daily. Patient taking differently: Take 20-40 mg by mouth See admin instructions. Take 1 tablet (20 mg) by mouth every morning and 2 tablets (40  mg) at 2pm 07/01/16  Yes Virgel Manifold, MD  gabapentin (NEURONTIN) 600 MG tablet Take 600 mg by mouth at bedtime. 03/27/15  Yes [provider]  levothyroxine (SYNTHROID, LEVOTHROID) 75 MCG tablet Take 75 mcg by mouth daily before breakfast.   Yes [provider]  lisinopril (PRINIVIL,ZESTRIL) 5 MG tablet Take 5 mg by mouth daily. 03/08/17  Yes [provider]  loperamide (IMODIUM) 2 MG capsule Take 2-4 mg by mouth See admin instructions. Take 2 capsules  (4 mg) by mouth every morning, may also take 1 capsule (2 mg) during the day as needed for loose stools   Yes [provider]  LUTEIN-ZEAXANTHIN PO Take 1 tablet by mouth daily with supper.   Yes [provider]  methocarbamol (ROBAXIN) 500 MG tablet Take 500 mg by mouth 2 (two) times daily.   Yes [provider]  metoprolol tartrate (LOPRESSOR) 25 MG tablet Take 12.5 mg by mouth 2 (two) times daily.  03/18/17  Yes [provider]  mirtazapine (REMERON) 7.5 MG tablet Take 7.5 mg by mouth every evening. 03/09/17  Yes [provider]  omeprazole (PRILOSEC) 40 MG capsule Take 40 mg by mouth 2 (two) times daily.    Yes [provider]  oxyCODONE-acetaminophen (PERCOCET/ROXICET) 5-325 MG per tablet Take 1-2 tablets by mouth every 4 (four) hours as needed for pain. Patient taking differently: Take 1 tablet by mouth 2 (two) times daily. MAY TAKE 1 ADDITIONAL TABLET ONCE A DAY IF PAIN PERSISTS 05/24/12  Yes Bonk, John-Adam, MD  OXYGEN Inhale 2.5-3 L into the lungs daily. Continuous   Yes [provider]  pilocarpine (PILOCAR) 4 % ophthalmic solution Place 1 drop into the left eye 2 (two) times daily.    Yes [provider]  rOPINIRole (REQUIP) 1 MG tablet Take 1 mg by mouth every evening. 02/11/17  Yes [provider]  spironolactone (ALDACTONE) 25 MG tablet Take 25 mg by mouth daily. 03/18/17  Yes [provider]  timolol (TIMOPTIC) 0.5 % ophthalmic solution Place 1 drop into the left eye 2 (two) times daily. 09/11/16  Yes [provider]  travoprost, benzalkonium, (TRAVATAN) 0.004 % ophthalmic solution Place 1 drop into the left eye at bedtime.    Yes [provider]  warfarin (COUMADIN) 2 MG tablet Take 1 mg by mouth daily with supper.   Yes [provider]  doxycycline (VIBRAMYCIN) 100 MG capsule Take 1 capsule (100 mg total) by mouth 2 (two) times daily. 03/21/17   Varney Biles, MD  metoprolol  succinate (TOPROL-XL) 25 MG 24 hr tablet Take 0.5 tablets (12.5 mg total) by mouth daily. Patient not taking: Reported on 03/21/2017 10/15/16   Donne Hazel, MD  potassium chloride (K-DUR) 10 MEQ tablet Take 1 tablet (10 mEq total) by mouth 2 (two) times daily. Patient not taking: Reported on 07/05/2016 07/01/16   Virgel Manifold, MD  predniSONE (DELTASONE) 10 MG tablet Take 6 tablets (60 mg total) by mouth daily. 03/21/17   Varney Biles, MD    Family History Family History  Problem Relation Age of Onset  . CAD Mother   . Heart failure Father   . Heart failure Brother   . CAD Brother   . Colon cancer Neg Hx   . Stomach cancer Neg Hx     Social History Social History  Substance Use Topics  . Smoking status: Never Smoker  . Smokeless tobacco: Never Used  . Alcohol use No     Allergies  Patient has no known allergies.   Review of Systems Review of Systems  Constitutional: Positive for activity change.  Respiratory: Positive for shortness of breath and wheezing.   Cardiovascular: Negative for chest pain.  Gastrointestinal: Negative for abdominal pain.  Genitourinary: Negative for dysuria, flank pain and frequency.  Allergic/Immunologic: Negative for immunocompromised state.  Hematological: Does not bruise/bleed easily.  All other systems reviewed and are negative.    Physical Exam Updated Vital Signs BP 111/74 (BP Location: Left Arm)   Pulse 66   Temp 97.7 F (36.5 C) (Oral)   Resp 18   SpO2 95%   Physical Exam  Constitutional: She is oriented to person, place, and time. She appears well-developed and well-nourished.  HENT:  Head: Normocephalic and atraumatic.  Eyes: Pupils are equal, round, and reactive to light. EOM are normal.  Neck: Neck supple. JVD present.  Cardiovascular: Normal rate.   Murmur heard. Pulmonary/Chest: Effort normal. No respiratory distress. She has wheezes. She has no rales.  Poor aeration / tight chest  Abdominal: Soft. She exhibits  no distension. There is no tenderness.  Musculoskeletal: She exhibits no edema or tenderness.  Neurological: She is alert and oriented to person, place, and time. No cranial nerve deficit.  Skin: Skin is warm and dry.  Pressure ulcer to the R heel - healing well  Nursing note and vitals reviewed.    ED Treatments / Results  Labs (all labs ordered are listed, but only abnormal results are displayed) Labs Reviewed  BASIC METABOLIC PANEL - Abnormal; Notable for the following:       Result Value   Calcium 8.8 (*)    GFR calc non Af Amer 59 (*)    All other components within normal limits  CBC WITH DIFFERENTIAL/PLATELET - Abnormal; Notable for the following:    RBC 3.22 (*)    Hemoglobin 9.6 (*)    HCT 29.9 (*)    RDW 16.1 (*)    All other components within normal limits  BRAIN NATRIURETIC PEPTIDE - Abnormal; Notable for the following:    B Natriuretic Peptide 862.9 (*)    All other components within normal limits  PROTIME-INR - Abnormal; Notable for the following:    Prothrombin Time 22.0 (*)    All other components within normal limits  TROPONIN I  MAGNESIUM  URINALYSIS, COMPLETE (UACMP) WITH MICROSCOPIC    EKG  EKG Interpretation  Date/Time:  Sunday March 21 2017 14:30:37 EDT Ventricular Rate:  72 PR Interval:    QRS Duration: 91 QT Interval:  616 QTC Calculation: 707 R Axis:   17 Text Interpretation:  Atrial fibrillation Borderline low voltage, extremity leads Minimal ST depression, lateral leads Prolonged QT interval No acute changes Confirmed by Varney Biles ([id number]) on 03/21/2017 2:50:10 PM       Radiology Dg Chest 2 View  Addendum Date: 03/21/2017   ADDENDUM REPORT: 03/21/2017 14:53 ADDENDUM: Chronic compression fracture noted within the lower thoracic spine. Stable kyphosis of the thoracolumbar spine. No acute or suspicious osseous finding. Electronically Signed   By: Franki Cabot M.D.   On: 03/21/2017 14:53   Result Date: 03/21/2017 CLINICAL DATA:   Shortness of breath. History of aspiration pneumonia. EXAM: CHEST  2 VIEW COMPARISON:  Chest x-ray dated 10/14/2016. FINDINGS: Chronic bibasilar opacities, stable, some component likely due to chronic pleural effusions and associated atelectasis. No new lung findings. Cardiomediastinal silhouette appears grossly stable. Atherosclerotic changes noted at the aortic arch. IMPRESSION: Stable chest x-ray. Chronic opacities each lung base, stable,  again most likely a combination of pleural effusions and atelectasis. No acute findings. Aortic atherosclerosis. Cardiomegaly. Electronically Signed: By: Franki Cabot M.D. On: 03/21/2017 14:49    Procedures Procedures (including critical care time)  Medications Ordered in ED Medications  furosemide (LASIX) injection 20 mg (not administered)  dexamethasone (DECADRON) injection 10 mg (not administered)  doxycycline (VIBRA-TABS) tablet 100 mg (not administered)  albuterol (PROVENTIL) (2.5 MG/3ML) 0.083% nebulizer solution 5 mg (5 mg Nebulization Given 03/21/17 1433)     Initial Impression / Assessment and Plan / ED Course  I have reviewed the triage vital signs and the nursing notes.  Pertinent labs & imaging results that were available during my care of the patient were reviewed by me and considered in my medical decision making (see chart for details).  Clinical Course as of Mar 22 1703  Nancy Fetter Mar 21, 2017  1700 Results from the ER workup discussed with the patient face to face and all questions answered to the best of my ability.  CXR is unchanged compared to last CXR. Repeat exam after nebs reveals no significant improvement and no wheezing, so I think her lungs are at baseline. Still - with the wheezing, AMS and improvement with tx, I think there is a component of COPD exacerbation and I feel like it is better to be aggressive and start her on nebs and steroid/doxy.  DG Chest 2 View [AN]  1702 BNP is elevated. Pt is not on lasix at the request of her  Cardiologist. 1 dose iv lasix here. No profound overload, so we have asked her to use 3 more days of lasix and f/u with cards. B Natriuretic Peptide: (!) 862.9 [AN]  6222  Pt and family ok going home.  Strict ER return precautions have been discussed, and patient is agreeing with the plan and is comfortable with the workup done and the recommendations from the ER.   [AN]    Clinical Course User Index [AN] Varney Biles, MD    Pt comes in with cc of dib. Pt has hx of AF, COPD, CHF. She has hx of DM and aspiration PNA few months back.  Seems like pt had confusion, and dib with wheezing prior to ER arrival, all of which spontaneously improved with nebs. PT is now ao x 3. She has a tight chest with poor aeration, but no clear signs of fluid overload or wheezing. CXR ordered. Pt has no focal neuro deficits and we doubt that her  sx were due to TIA.  Final Clinical Impressions(s) / ED Diagnoses   Final diagnoses:  COPD exacerbation (HCC)  Dyspnea, unspecified type    New Prescriptions New Prescriptions   DOXYCYCLINE (VIBRAMYCIN) 100 MG CAPSULE    Take 1 capsule (100 mg total) by mouth 2 (two) times daily.   PREDNISONE (DELTASONE) 10 MG TABLET    Take 6 tablets (60 mg total) by mouth daily.     Varney Biles, MD 03/21/17 9798    Varney Biles, MD 03/21/17 [place], Donnae Michels, MD 03/21/17 9211

## 2017-03-21 NOTE — ED Notes (Signed)
Bed: KW40 Expected date:  Expected time:  Means of arrival:  Comments: 83 yo/ SOB

## 2017-03-21 NOTE — Discharge Instructions (Signed)
We saw you in the ER for the shortness of breath. All the results in the ER are normal, labs and imaging. We are not sure what is causing your symptoms. We suspect mild COPD exacerbation and mild CHF component. We would like you to take the meds prescribed, given breathing treatments every 4-6 hours as needed and take lasix for 3 days. See the Cardiologist in 1 week. Return to the ER immediately if the symptoms get worse.  The workup in the ER is not complete, and is limited to screening for life threatening and emergent conditions only, so please see a primary care doctor for further evaluation.

## 2017-03-21 NOTE — ED Triage Notes (Signed)
She became short of breath this morning, which necessitated an albuterol neb. Treatment. Her daughter was concerned, because pt. Hadn't needed a neb. tx in some 2 months; and because pt. Has hx of aspiration pneumonia. Her daughter insisted (pt. Didn't want to) that she come to hospital to be checked. Pt. Arrives in no distress.

## 2017-03-22 DIAGNOSIS — I4891 Unspecified atrial fibrillation: Secondary | ICD-10-CM | POA: Diagnosis not present

## 2017-03-22 DIAGNOSIS — L89613 Pressure ulcer of right heel, stage 3: Secondary | ICD-10-CM | POA: Diagnosis not present

## 2017-03-22 DIAGNOSIS — I110 Hypertensive heart disease with heart failure: Secondary | ICD-10-CM | POA: Diagnosis not present

## 2017-03-22 DIAGNOSIS — I5032 Chronic diastolic (congestive) heart failure: Secondary | ICD-10-CM | POA: Diagnosis not present

## 2017-03-22 DIAGNOSIS — J441 Chronic obstructive pulmonary disease with (acute) exacerbation: Secondary | ICD-10-CM | POA: Diagnosis not present

## 2017-03-22 DIAGNOSIS — R41841 Cognitive communication deficit: Secondary | ICD-10-CM | POA: Diagnosis not present

## 2017-03-24 DIAGNOSIS — I5032 Chronic diastolic (congestive) heart failure: Secondary | ICD-10-CM | POA: Diagnosis not present

## 2017-03-24 DIAGNOSIS — I110 Hypertensive heart disease with heart failure: Secondary | ICD-10-CM | POA: Diagnosis not present

## 2017-03-24 DIAGNOSIS — R41841 Cognitive communication deficit: Secondary | ICD-10-CM | POA: Diagnosis not present

## 2017-03-24 DIAGNOSIS — L89613 Pressure ulcer of right heel, stage 3: Secondary | ICD-10-CM | POA: Diagnosis not present

## 2017-03-24 DIAGNOSIS — J441 Chronic obstructive pulmonary disease with (acute) exacerbation: Secondary | ICD-10-CM | POA: Diagnosis not present

## 2017-03-24 DIAGNOSIS — I4891 Unspecified atrial fibrillation: Secondary | ICD-10-CM | POA: Diagnosis not present

## 2017-03-31 ENCOUNTER — Inpatient Hospital Stay (HOSPITAL_COMMUNITY)
Admission: EM | Admit: 2017-03-31 | Discharge: 2017-04-09 | DRG: 871 | Disposition: A | Discharge status: Skilled Nursing Facility | Payer: Medicare Other | Attending: Internal Medicine | Admitting: Internal Medicine

## 2017-03-31 ENCOUNTER — Observation Stay (HOSPITAL_COMMUNITY): Payer: Medicare Other

## 2017-03-31 ENCOUNTER — Encounter (HOSPITAL_COMMUNITY): Payer: Self-pay | Admitting: Emergency Medicine

## 2017-03-31 ENCOUNTER — Ambulatory Visit: Payer: Medicare Other | Admitting: Physician Assistant

## 2017-03-31 DIAGNOSIS — I5043 Acute on chronic combined systolic (congestive) and diastolic (congestive) heart failure: Secondary | ICD-10-CM | POA: Diagnosis not present

## 2017-03-31 DIAGNOSIS — K529 Noninfective gastroenteritis and colitis, unspecified: Secondary | ICD-10-CM | POA: Diagnosis present

## 2017-03-31 DIAGNOSIS — J811 Chronic pulmonary edema: Secondary | ICD-10-CM

## 2017-03-31 DIAGNOSIS — E43 Unspecified severe protein-calorie malnutrition: Secondary | ICD-10-CM | POA: Diagnosis present

## 2017-03-31 DIAGNOSIS — G9341 Metabolic encephalopathy: Secondary | ICD-10-CM | POA: Diagnosis not present

## 2017-03-31 DIAGNOSIS — I509 Heart failure, unspecified: Secondary | ICD-10-CM

## 2017-03-31 DIAGNOSIS — E872 Acidosis: Secondary | ICD-10-CM | POA: Diagnosis present

## 2017-03-31 DIAGNOSIS — I499 Cardiac arrhythmia, unspecified: Secondary | ICD-10-CM | POA: Diagnosis not present

## 2017-03-31 DIAGNOSIS — B964 Proteus (mirabilis) (morganii) as the cause of diseases classified elsewhere: Secondary | ICD-10-CM | POA: Diagnosis present

## 2017-03-31 DIAGNOSIS — R410 Disorientation, unspecified: Secondary | ICD-10-CM | POA: Diagnosis not present

## 2017-03-31 DIAGNOSIS — R319 Hematuria, unspecified: Secondary | ICD-10-CM | POA: Diagnosis not present

## 2017-03-31 DIAGNOSIS — H409 Unspecified glaucoma: Secondary | ICD-10-CM | POA: Diagnosis present

## 2017-03-31 DIAGNOSIS — R064 Hyperventilation: Secondary | ICD-10-CM

## 2017-03-31 DIAGNOSIS — E860 Dehydration: Secondary | ICD-10-CM | POA: Diagnosis present

## 2017-03-31 DIAGNOSIS — A4159 Other Gram-negative sepsis: Principal | ICD-10-CM | POA: Diagnosis present

## 2017-03-31 DIAGNOSIS — R471 Dysarthria and anarthria: Secondary | ICD-10-CM | POA: Diagnosis not present

## 2017-03-31 DIAGNOSIS — N179 Acute kidney failure, unspecified: Secondary | ICD-10-CM

## 2017-03-31 DIAGNOSIS — J441 Chronic obstructive pulmonary disease with (acute) exacerbation: Secondary | ICD-10-CM | POA: Diagnosis not present

## 2017-03-31 DIAGNOSIS — S90822A Blister (nonthermal), left foot, initial encounter: Secondary | ICD-10-CM | POA: Diagnosis present

## 2017-03-31 DIAGNOSIS — E039 Hypothyroidism, unspecified: Secondary | ICD-10-CM | POA: Diagnosis present

## 2017-03-31 DIAGNOSIS — X58XXXA Exposure to other specified factors, initial encounter: Secondary | ICD-10-CM | POA: Diagnosis present

## 2017-03-31 DIAGNOSIS — R41841 Cognitive communication deficit: Secondary | ICD-10-CM | POA: Diagnosis not present

## 2017-03-31 DIAGNOSIS — G629 Polyneuropathy, unspecified: Secondary | ICD-10-CM | POA: Diagnosis present

## 2017-03-31 DIAGNOSIS — R6521 Severe sepsis with septic shock: Secondary | ICD-10-CM | POA: Diagnosis present

## 2017-03-31 DIAGNOSIS — I110 Hypertensive heart disease with heart failure: Secondary | ICD-10-CM | POA: Diagnosis not present

## 2017-03-31 DIAGNOSIS — L89150 Pressure ulcer of sacral region, unstageable: Secondary | ICD-10-CM | POA: Diagnosis present

## 2017-03-31 DIAGNOSIS — M810 Age-related osteoporosis without current pathological fracture: Secondary | ICD-10-CM | POA: Diagnosis present

## 2017-03-31 DIAGNOSIS — Z7901 Long term (current) use of anticoagulants: Secondary | ICD-10-CM

## 2017-03-31 DIAGNOSIS — Z1612 Extended spectrum beta lactamase (ESBL) resistance: Secondary | ICD-10-CM | POA: Diagnosis present

## 2017-03-31 DIAGNOSIS — J449 Chronic obstructive pulmonary disease, unspecified: Secondary | ICD-10-CM | POA: Diagnosis present

## 2017-03-31 DIAGNOSIS — N390 Urinary tract infection, site not specified: Secondary | ICD-10-CM | POA: Diagnosis not present

## 2017-03-31 DIAGNOSIS — R29810 Facial weakness: Secondary | ICD-10-CM | POA: Diagnosis not present

## 2017-03-31 DIAGNOSIS — L89613 Pressure ulcer of right heel, stage 3: Secondary | ICD-10-CM | POA: Diagnosis not present

## 2017-03-31 DIAGNOSIS — G92 Toxic encephalopathy: Secondary | ICD-10-CM | POA: Diagnosis not present

## 2017-03-31 DIAGNOSIS — I5032 Chronic diastolic (congestive) heart failure: Secondary | ICD-10-CM | POA: Diagnosis not present

## 2017-03-31 DIAGNOSIS — I4891 Unspecified atrial fibrillation: Secondary | ICD-10-CM | POA: Diagnosis not present

## 2017-03-31 DIAGNOSIS — L8990 Pressure ulcer of unspecified site, unspecified stage: Secondary | ICD-10-CM | POA: Insufficient documentation

## 2017-03-31 DIAGNOSIS — Z8249 Family history of ischemic heart disease and other diseases of the circulatory system: Secondary | ICD-10-CM

## 2017-03-31 DIAGNOSIS — Z79899 Other long term (current) drug therapy: Secondary | ICD-10-CM

## 2017-03-31 DIAGNOSIS — R001 Bradycardia, unspecified: Secondary | ICD-10-CM | POA: Diagnosis not present

## 2017-03-31 DIAGNOSIS — Z90710 Acquired absence of both cervix and uterus: Secondary | ICD-10-CM

## 2017-03-31 DIAGNOSIS — R4182 Altered mental status, unspecified: Secondary | ICD-10-CM

## 2017-03-31 DIAGNOSIS — J9610 Chronic respiratory failure, unspecified whether with hypoxia or hypercapnia: Secondary | ICD-10-CM | POA: Diagnosis present

## 2017-03-31 DIAGNOSIS — R1310 Dysphagia, unspecified: Secondary | ICD-10-CM | POA: Diagnosis present

## 2017-03-31 DIAGNOSIS — I482 Chronic atrial fibrillation: Secondary | ICD-10-CM | POA: Diagnosis present

## 2017-03-31 DIAGNOSIS — M1990 Unspecified osteoarthritis, unspecified site: Secondary | ICD-10-CM | POA: Diagnosis present

## 2017-03-31 LAB — CBC WITH DIFFERENTIAL/PLATELET
Basophils Absolute: 0 10*3/uL (ref 0.0–0.1)
Basophils Relative: 0 %
Eosinophils Absolute: 0 10*3/uL (ref 0.0–0.7)
Eosinophils Relative: 0 %
HCT: 32.2 % — ABNORMAL LOW (ref 36.0–46.0)
Hemoglobin: 10.8 g/dL — ABNORMAL LOW (ref 12.0–15.0)
Lymphocytes Relative: 7 %
Lymphs Abs: 1 10*3/uL (ref 0.7–4.0)
MCH: 30.8 pg (ref 26.0–34.0)
MCHC: 33.5 g/dL (ref 30.0–36.0)
MCV: 91.7 fL (ref 78.0–100.0)
Monocytes Absolute: 1.2 10*3/uL — ABNORMAL HIGH (ref 0.1–1.0)
Monocytes Relative: 9 %
Neutro Abs: 11 10*3/uL — ABNORMAL HIGH (ref 1.7–7.7)
Neutrophils Relative %: 84 %
Platelets: 223 10*3/uL (ref 150–400)
RBC: 3.51 MIL/uL — ABNORMAL LOW (ref 3.87–5.11)
RDW: 16.6 % — ABNORMAL HIGH (ref 11.5–15.5)
WBC: 13.1 10*3/uL — ABNORMAL HIGH (ref 4.0–10.5)

## 2017-03-31 LAB — COMPREHENSIVE METABOLIC PANEL
ALT: 22 U/L (ref 14–54)
AST: 23 U/L (ref 15–41)
Albumin: 2.7 g/dL — ABNORMAL LOW (ref 3.5–5.0)
Alkaline Phosphatase: 70 U/L (ref 38–126)
Anion gap: 10 (ref 5–15)
BUN: UNDETERMINED mg/dL (ref 6–20)
CO2: 24 mmol/L (ref 22–32)
Calcium: 8.8 mg/dL — ABNORMAL LOW (ref 8.9–10.3)
Chloride: 103 mmol/L (ref 101–111)
Creatinine, Ser: UNDETERMINED mg/dL (ref 0.44–1.00)
Glucose, Bld: 82 mg/dL (ref 65–99)
Potassium: 4.4 mmol/L (ref 3.5–5.1)
Sodium: 137 mmol/L (ref 135–145)
Total Bilirubin: 0.7 mg/dL (ref 0.3–1.2)
Total Protein: 5.8 g/dL — ABNORMAL LOW (ref 6.5–8.1)

## 2017-03-31 LAB — URINALYSIS, ROUTINE W REFLEX MICROSCOPIC

## 2017-03-31 LAB — PROTIME-INR
INR: 3
Prothrombin Time: 31.8 seconds — ABNORMAL HIGH (ref 11.4–15.2)

## 2017-03-31 MED ORDER — ACETAMINOPHEN 650 MG RE SUPP: 650.0000 mg | Freq: Four times a day (QID) | RECTAL | Status: DC | PRN

## 2017-03-31 MED ORDER — ONDANSETRON HCL 4 MG/2ML IJ SOLN: 4.0000 mg | Freq: Four times a day (QID) | INTRAMUSCULAR | Status: DC | PRN

## 2017-03-31 MED ORDER — ACETAMINOPHEN 325 MG PO TABS
650.0000 mg | ORAL_TABLET | Freq: Four times a day (QID) | ORAL | Status: DC | PRN
  Administered 2017-04-06 – 2017-04-08 (×4): 650 mg via ORAL
  Filled 2017-03-31 (×4): qty 2

## 2017-03-31 MED ORDER — TIMOLOL MALEATE 0.5 % OP SOLN
1.0000 [drp] | Freq: Two times a day (BID) | OPHTHALMIC | Status: DC
  Administered 2017-03-31 – 2017-04-09 (×18): 1 [drp] via OPHTHALMIC
  Filled 2017-03-31: qty 5

## 2017-03-31 MED ORDER — PILOCARPINE HCL 4 % OP SOLN
1.0000 [drp] | Freq: Two times a day (BID) | OPHTHALMIC | Status: DC
  Administered 2017-03-31 – 2017-04-09 (×18): 1 [drp] via OPHTHALMIC
  Filled 2017-03-31: qty 15

## 2017-03-31 MED ORDER — ROPINIROLE HCL 1 MG PO TABS
1.0000 mg | ORAL_TABLET | Freq: Every day | ORAL | Status: DC
  Administered 2017-03-31 – 2017-04-08 (×8): 1 mg via ORAL
  Filled 2017-03-31 (×9): qty 1

## 2017-03-31 MED ORDER — SENNOSIDES-DOCUSATE SODIUM 8.6-50 MG PO TABS: 1.0000 | ORAL_TABLET | Freq: Every evening | ORAL | Status: DC | PRN

## 2017-03-31 MED ORDER — LOPERAMIDE HCL 2 MG PO CAPS: 4.0000 mg | ORAL_CAPSULE | Freq: Every day | ORAL | Status: DC

## 2017-03-31 MED ORDER — SODIUM CHLORIDE 0.9 % IV BOLUS (SEPSIS)
1000.0000 mL | Freq: Once | INTRAVENOUS | Status: AC
  Administered 2017-03-31: 1000 mL via INTRAVENOUS

## 2017-03-31 MED ORDER — OXYCODONE-ACETAMINOPHEN 5-325 MG PO TABS
1.0000 | ORAL_TABLET | ORAL | Status: DC | PRN
  Administered 2017-03-31: 1 via ORAL
  Filled 2017-03-31: qty 1

## 2017-03-31 MED ORDER — LATANOPROST 0.005 % OP SOLN
1.0000 [drp] | Freq: Every day | OPHTHALMIC | Status: DC
  Administered 2017-03-31 – 2017-04-08 (×9): 1 [drp] via OPHTHALMIC
  Filled 2017-03-31: qty 2.5

## 2017-03-31 MED ORDER — ALBUTEROL SULFATE (2.5 MG/3ML) 0.083% IN NEBU
2.5000 mg | INHALATION_SOLUTION | RESPIRATORY_TRACT | Status: DC | PRN
  Administered 2017-04-02 – 2017-04-03 (×2): 2.5 mg via RESPIRATORY_TRACT
  Filled 2017-03-31 (×2): qty 3

## 2017-03-31 MED ORDER — SODIUM CHLORIDE 0.9 % IV SOLN
INTRAVENOUS | Status: DC
  Administered 2017-03-31: 21:00:00 via INTRAVENOUS

## 2017-03-31 MED ORDER — GUAIFENESIN ER 600 MG PO TB12
600.0000 mg | ORAL_TABLET | Freq: Two times a day (BID) | ORAL | Status: DC
  Administered 2017-03-31: 600 mg via ORAL
  Filled 2017-03-31: qty 1

## 2017-03-31 MED ORDER — WARFARIN 0.5 MG HALF TABLET
0.5000 mg | ORAL_TABLET | Freq: Once | ORAL | Status: AC
  Administered 2017-03-31: 0.5 mg via ORAL
  Filled 2017-03-31 (×2): qty 1

## 2017-03-31 MED ORDER — WARFARIN - PHARMACIST DOSING INPATIENT: Freq: Every day | Status: DC

## 2017-03-31 MED ORDER — ONDANSETRON HCL 4 MG PO TABS: 4.0000 mg | ORAL_TABLET | Freq: Four times a day (QID) | ORAL | Status: DC | PRN

## 2017-03-31 MED ORDER — LUTEIN 6 MG PO CAPS: 6.0000 mg | ORAL_CAPSULE | Freq: Every day | ORAL | Status: DC

## 2017-03-31 MED ORDER — PANTOPRAZOLE SODIUM 40 MG PO TBEC: 40.0000 mg | DELAYED_RELEASE_TABLET | Freq: Every day | ORAL | Status: DC

## 2017-03-31 MED ORDER — BISACODYL 5 MG PO TBEC: 5.0000 mg | DELAYED_RELEASE_TABLET | Freq: Every day | ORAL | Status: DC | PRN

## 2017-03-31 MED ORDER — METHOCARBAMOL 500 MG PO TABS
500.0000 mg | ORAL_TABLET | Freq: Two times a day (BID) | ORAL | Status: DC
  Administered 2017-03-31: 500 mg via ORAL
  Filled 2017-03-31: qty 1

## 2017-03-31 MED ORDER — METOPROLOL TARTRATE 12.5 MG HALF TABLET
12.5000 mg | ORAL_TABLET | Freq: Two times a day (BID) | ORAL | Status: DC
  Administered 2017-03-31: 12.5 mg via ORAL
  Filled 2017-03-31: qty 1

## 2017-03-31 MED ORDER — LOPERAMIDE HCL 2 MG PO CAPS
2.0000 mg | ORAL_CAPSULE | Freq: Every day | ORAL | Status: DC | PRN
Start: 2017-03-31 — End: 2017-04-09

## 2017-03-31 MED ORDER — LISINOPRIL 2.5 MG PO TABS
5.0000 mg | ORAL_TABLET | Freq: Every day | ORAL | Status: DC
  Administered 2017-03-31: 5 mg via ORAL
  Filled 2017-03-31: qty 1

## 2017-03-31 MED ORDER — FLUOXETINE HCL 20 MG PO CAPS
40.0000 mg | ORAL_CAPSULE | Freq: Every day | ORAL | Status: DC
  Administered 2017-04-02 – 2017-04-09 (×8): 40 mg via ORAL
  Filled 2017-03-31 (×8): qty 2

## 2017-03-31 MED ORDER — LEVOTHYROXINE SODIUM 75 MCG PO TABS: 75.0000 ug | ORAL_TABLET | Freq: Every day | ORAL | Status: DC

## 2017-03-31 MED ORDER — CEFTRIAXONE SODIUM 1 G IJ SOLR
1.0000 g | INTRAMUSCULAR | Status: DC
  Filled 2017-03-31: qty 10

## 2017-03-31 MED ORDER — ENSURE ENLIVE PO LIQD
237.0000 mL | Freq: Two times a day (BID) | ORAL | Status: DC
  Administered 2017-04-02 – 2017-04-09 (×14): 237 mL via ORAL

## 2017-03-31 MED ORDER — GABAPENTIN 300 MG PO CAPS
600.0000 mg | ORAL_CAPSULE | Freq: Every day | ORAL | Status: DC
  Administered 2017-03-31: 600 mg via ORAL
  Filled 2017-03-31: qty 2

## 2017-03-31 MED ORDER — DEXTROSE 5 % IV SOLN
1.0000 g | Freq: Once | INTRAVENOUS | Status: AC
  Administered 2017-03-31: 1 g via INTRAVENOUS
  Filled 2017-03-31: qty 10

## 2017-03-31 MED ORDER — TRAVOPROST (BAK FREE) 0.004 % OP SOLN
1.0000 [drp] | Freq: Every day | OPHTHALMIC | Status: DC
  Filled 2017-03-31: qty 2.5

## 2017-03-31 MED ORDER — DORZOLAMIDE HCL 2 % OP SOLN
1.0000 [drp] | Freq: Two times a day (BID) | OPHTHALMIC | Status: DC
  Administered 2017-03-31 – 2017-04-09 (×18): 1 [drp] via OPHTHALMIC
  Filled 2017-03-31: qty 10

## 2017-03-31 MED ORDER — MIRTAZAPINE 15 MG PO TABS
7.5000 mg | ORAL_TABLET | Freq: Every day | ORAL | Status: DC
  Administered 2017-03-31 – 2017-04-08 (×8): 7.5 mg via ORAL
  Filled 2017-03-31 (×8): qty 1

## 2017-03-31 NOTE — ED Triage Notes (Signed)
Patient here from home with complaints of blood in urine. Incontinent and bed bound. Normally wears 3L O2. No complaints at this time.

## 2017-03-31 NOTE — ED Notes (Signed)
Calling lab to bed side. 2 failed attempts to collect labs

## 2017-03-31 NOTE — ED Notes (Signed)
Bed: WA08 Expected date:  Expected time:  Means of arrival:  Comments: EMS/83 y.o./hematuria

## 2017-03-31 NOTE — ED Notes (Signed)
Call report to Thang Flett at [phone] at [phone]

## 2017-03-31 NOTE — H&P (Signed)
History and Physical    [name] SEG:[id number] DOB: [birth year] DOA: 03/31/2017  PCP: Tamsen Roers, MD  Patient coming from: Home  Chief Complaint: Confusion, lethargy, weakness   HPI: [name] is a 83 y.o. female with medical history significant of atrial fib on coumadin, chronic respiratory failure on 2.5-3L at baseline O2, chronic combined systolic and diastolic heart failure, HTN, hypothyroidism, and chronic pressure ulcers. She was evaluated in the ED a week ago for confusion and lethargy, was diagnosed with URI/COPD exacerbation as well as UTI. She was given steroids, doxycyline and keflex and sent home. Since then, she has not gotten any better. She has continued to be lethargic, not acting her normal self, weak, confused, with decreased PO intake. No reported fevers, but admits to chills, no chest pain, nausea, vomiting. She has chronic diarrhea. She has also had intermittent lower abdominal/pelvic discomfort. At baseline, she does not ambulate. She is able to assist in transferring to motorized wheelchair.   ED Course: Work up UA with UTI. She was given rocephin.   Review of Systems: As per HPI otherwise 10 point review of systems negative.   Past Medical History:  Diagnosis Date  . Atrial fibrillation (HCC)    chronic Coumadin.   Marland Kitchen COPD (chronic obstructive pulmonary disease) ([place])   . Glaucoma   . Hypothyroidism   . Neuropathy    PERIPHERAL  . Osteoarthritis   . Osteoporosis   . Trimalleolar fracture    LEFT TRIMALLEOLAR FRACTURE, DISLOCATION WITH OBLIQUE FRACTURE OF THE DISTAL FIBULAR DIAPHYSIS, AND NOTED BEING MILDLY COMMINUTED    Past Surgical History:  Procedure Laterality Date  . COLONOSCOPY W/ POLYPECTOMY  11/2010   Dr Fuller Plan.  multiple polyps:cecal, transverse, sigmoid.  largest 11mm, path: tubular adenomas without HGD.  mild sigmoid diverticulosis.   Marland Kitchen ORIF TIBIA & FIBULA FRACTURES  2016  . VAGINAL HYSTERECTOMY       reports that she has never  smoked. She has never used smokeless tobacco. She reports that she does not drink alcohol or use drugs.  No Known Allergies  Family History  Problem Relation Age of Onset  . CAD Mother   . Heart failure Father   . Heart failure Brother   . CAD Brother   . Colon cancer Neg Hx   . Stomach cancer Neg Hx     Prior to Admission medications   Medication Sig Start Date End Date Taking? Authorizing Provider  albuterol (PROVENTIL) (2.5 MG/3ML) 0.083% nebulizer solution Take 2.5 mg by nebulization every 4 (four) hours as needed for wheezing or shortness of breath.    Yes [provider]  dorzolamide (TRUSOPT) 2 % ophthalmic solution Place 1 drop into the left eye 2 (two) times daily. 09/11/16  Yes [provider]  ergocalciferol (VITAMIN D2) 50000 UNITS capsule Take 50,000 Units by mouth every Thursday.    Yes [provider]  FLUoxetine (PROZAC) 40 MG capsule Take 40 mg by mouth daily. 03/29/17  Yes [provider]  gabapentin (NEURONTIN) 600 MG tablet Take 600 mg by mouth at bedtime. 03/27/15  Yes [provider]  guaiFENesin (MUCINEX) 600 MG 12 hr tablet Take 600 mg by mouth 2 (two) times daily.   Yes [provider]  levothyroxine (SYNTHROID, LEVOTHROID) 75 MCG tablet Take 75 mcg by mouth daily before breakfast.   Yes [provider]  lisinopril (PRINIVIL,ZESTRIL) 5 MG tablet Take 5 mg by mouth daily. 03/08/17  Yes [provider]  loperamide (IMODIUM) 2  MG capsule Take 4-6 mg by mouth See admin instructions. Take 2 capsules (4 mg) by mouth every morning, may also take 1 capsule (2 mg) during the day as needed for loose stools    Yes [provider]  Lutein 6 MG CAPS Take 6 mg by mouth daily.   Yes [provider]  methocarbamol (ROBAXIN) 500 MG tablet Take 500 mg by mouth 2 (two) times daily.   Yes [provider]  metoprolol tartrate (LOPRESSOR) 25 MG tablet Take 12.5 mg by mouth 2 (two) times daily.   03/18/17  Yes [provider]  mirtazapine (REMERON) 7.5 MG tablet Take 7.5 mg by mouth every evening. 03/09/17  Yes [provider]  omeprazole (PRILOSEC) 40 MG capsule Take 40 mg by mouth 2 (two) times daily.    Yes [provider]  oxyCODONE-acetaminophen (PERCOCET/ROXICET) 5-325 MG per tablet Take 1-2 tablets by mouth every 4 (four) hours as needed for pain. Patient taking differently: Take 1 tablet by mouth 3 (three) times daily as needed for moderate pain. MAY TAKE 1 ADDITIONAL TABLET ONCE A DAY IF PAIN PERSISTS 05/24/12  Yes Bonk, John-Adam, MD  pilocarpine (PILOCAR) 4 % ophthalmic solution Place 1 drop into the left eye 2 (two) times daily.    Yes [provider]  rOPINIRole (REQUIP) 1 MG tablet Take 1 mg by mouth every evening. 02/11/17  Yes [provider]  spironolactone (ALDACTONE) 25 MG tablet Take 25 mg by mouth daily. 03/18/17  Yes [provider]  timolol (TIMOPTIC) 0.5 % ophthalmic solution Place 1 drop into the left eye 2 (two) times daily. 09/11/16  Yes [provider]  travoprost, benzalkonium, (TRAVATAN) 0.004 % ophthalmic solution Place 1 drop into the left eye at bedtime.    Yes [provider]  warfarin (COUMADIN) 2 MG tablet Take 1 mg by mouth daily with supper.   Yes [provider]  cephALEXin (KEFLEX) 500 MG capsule Take 1 capsule (500 mg total) by mouth 4 (four) times daily. Patient not taking: Reported on 03/31/2017 03/21/17   Varney Biles, MD  doxycycline (VIBRAMYCIN) 100 MG capsule Take 1 capsule (100 mg total) by mouth 2 (two) times daily. Patient not taking: Reported on 03/31/2017 03/21/17   Varney Biles, MD  furosemide (LASIX) 20 MG tablet Take 1 tablet (20 mg total) by mouth 2 (two) times daily. Patient not taking: Reported on 03/31/2017 07/01/16   Virgel Manifold, MD  metoprolol succinate (TOPROL-XL) 25 MG 24 hr tablet Take 0.5 tablets (12.5 mg total) by mouth daily. Patient not taking:  Reported on 03/21/2017 10/15/16   Donne Hazel, MD  OXYGEN Inhale 2.5-3 L into the lungs daily. Continuous    [provider]  potassium chloride (K-DUR) 10 MEQ tablet Take 1 tablet (10 mEq total) by mouth 2 (two) times daily. Patient not taking: Reported on 03/31/2017 07/01/16   Virgel Manifold, MD  predniSONE (DELTASONE) 10 MG tablet Take 6 tablets (60 mg total) by mouth daily. Patient not taking: Reported on 03/31/2017 03/21/17   Varney Biles, MD    Physical Exam: Vitals:   03/31/17 1645 03/31/17 1730 03/31/17 1835 03/31/17 1945  BP: (!) 116/56 125/81 131/65 (!) 150/78  Pulse:   71 66  Resp: 20 16 19 20   Temp:    98 F (36.7 C)  TempSrc:    Oral  SpO2:   100% 100%    Constitutional: NAD, calm, comfortable, elderly, thin, chronically ill appearing female  Eyes: PERRL, lids and conjunctivae  normal ENMT: Mucous membranes are dry. Posterior pharynx clear of any exudate or lesions.Normal dentition.  Neck: normal, supple, no masses, no thyromegaly Respiratory: minimal crackles bilateral base. Normal respiratory effort. No accessory muscle use. On [place] O2  Cardiovascular: Irregular rhythm, rate 80s. No extremity edema.  Abdomen: no tenderness, no masses palpated. No hepatosplenomegaly. Bowel sounds positive. Soft.  Musculoskeletal: no clubbing / cyanosis. No joint deformity upper and lower extremities.   Skin: +bilateral heel ulcers, +right lateral malleolar ulcer with screw visible. No acute infection.  Neurologic: CN 2-12 grossly intact. Non focal  Psychiatric: Alert and oriented, poor historian. Pleasant   Labs on Admission: I have personally reviewed following labs and imaging studies  CBC:  Recent Labs Lab 03/31/17 1524  WBC 13.1*  NEUTROABS 11.0*  HGB 10.8*  HCT 32.2*  MCV 91.7  PLT 948   Basic Metabolic Panel:  Recent Labs Lab 03/31/17 1524  NA 137  K 4.4  CL 103  CO2 24  GLUCOSE 82  BUN QUANTITY NOT SUFFICIENT, UNABLE TO PERFORM TEST  CREATININE  QUANTITY NOT SUFFICIENT, UNABLE TO PERFORM TEST  CALCIUM 8.8*   GFR: CrCl cannot be calculated (This lab value cannot be used to calculate CrCl because it is not a number: QUANTITY NOT SUFFICIENT, UNABLE TO PERFORM TEST). Liver Function Tests:  Recent Labs Lab 03/31/17 1524  AST 23  ALT 22  ALKPHOS 70  BILITOT 0.7  PROT 5.8*  ALBUMIN 2.7*   No results for input(s): LIPASE, AMYLASE in the last 168 hours. No results for input(s): AMMONIA in the last 168 hours. Coagulation Profile:  Recent Labs Lab 03/31/17 1524  INR 3.00   Cardiac Enzymes: No results for input(s): CKTOTAL, CKMB, CKMBINDEX, TROPONINI in the last 168 hours. BNP (last 3 results) No results for input(s): PROBNP in the last 8760 hours. HbA1C: No results for input(s): HGBA1C in the last 72 hours. CBG: No results for input(s): GLUCAP in the last 168 hours. Lipid Profile: No results for input(s): CHOL, HDL, LDLCALC, TRIG, CHOLHDL, LDLDIRECT in the last 72 hours. Thyroid Function Tests: No results for input(s): TSH, T4TOTAL, FREET4, T3FREE, THYROIDAB in the last 72 hours. Anemia Panel: No results for input(s): VITAMINB12, FOLATE, FERRITIN, TIBC, IRON, RETICCTPCT in the last 72 hours. Urine analysis:    Component Value Date/Time   COLORURINE RED (A) 03/31/2017 1449   APPEARANCEUR TURBID (A) 03/31/2017 1449   LABSPEC  03/31/2017 1449    TEST NOT REPORTED DUE TO COLOR INTERFERENCE OF URINE PIGMENT   PHURINE  03/31/2017 1449    TEST NOT REPORTED DUE TO COLOR INTERFERENCE OF URINE PIGMENT   GLUCOSEU (A) 03/31/2017 1449    TEST NOT REPORTED DUE TO COLOR INTERFERENCE OF URINE PIGMENT   HGBUR (A) 03/31/2017 1449    TEST NOT REPORTED DUE TO COLOR INTERFERENCE OF URINE PIGMENT   BILIRUBINUR (A) 03/31/2017 1449    TEST NOT REPORTED DUE TO COLOR INTERFERENCE OF URINE PIGMENT   KETONESUR (A) 03/31/2017 1449    TEST NOT REPORTED DUE TO COLOR INTERFERENCE OF URINE PIGMENT   PROTEINUR (A) 03/31/2017 1449    TEST NOT  REPORTED DUE TO COLOR INTERFERENCE OF URINE PIGMENT   UROBILINOGEN 1.0 11/22/2010 0949   NITRITE (A) 03/31/2017 1449    TEST NOT REPORTED DUE TO COLOR INTERFERENCE OF URINE PIGMENT   LEUKOCYTESUR (A) 03/31/2017 1449    TEST NOT REPORTED DUE TO COLOR INTERFERENCE OF URINE PIGMENT   Sepsis Labs: !!!!!!!!!!!!!!!!!!!!!!!!!!!!!!!!!!!!!!!!!!!! @LABRCNTIP (procalcitonin:4,lacticidven:4) )No results found for this or any  previous visit (from the past 240 hour(s)).   Radiological Exams on Admission: No results found.   Assessment/Plan Principal Problem:   Acute metabolic encephalopathy Active Problems:   UTI (urinary tract infection)  Acute toxic encephalopathy -Treat infection as below  UTI, POA -Failure of outpatient treatment with Keflex -Urine culture pending -Blood culture pending -Continue Rocephin  Hematuria -In setting of UTI and coumadin use. Monitor  Chronic atrial fibrillation -Coumadin per pharmacy  Chronic respiratory failure -2.5-3L [place] O2 at baseline   Chronic systolic and diastolic heart failure -EF 30-35% -Follows with Dr. Einar Gip -Does not appear fluid overloaded, actually appears dehydrated -Hold Lasix and spironolactone. Cautious IVF in setting of dehydration and UTI  -Check CXR   Chronic pressure ulcers on her coccyx, bilateral heels, right lateral malleoli -Does not appear acutely infected -Turn every 2 hours -Wound RN consult  Hypertension -Continue lisinopril, lopressor   Hypothyroidism -Continue Synthroid    DVT prophylaxis: coumadin Code Status: full, confirmed with daughter  Family Communication: daughter at bedside Disposition Plan: pending improvement Consults called: none  Admission status: observation   Severity of Illness: The appropriate patient status for this patient is OBSERVATION. Observation status is judged to be reasonable and necessary in order to provide the required intensity of service to ensure the patient's safety.  The patient's presenting symptoms, physical exam findings, and initial radiographic and laboratory data in the context of their medical condition is felt to place them at decreased risk for further clinical deterioration. Furthermore, it is anticipated that the patient will be medically stable for discharge from the hospital within 2 midnights of admission. The following factors support the patient status of observation.   " The patient's presenting symptoms include AMS, weakness. " The physical exam findings include dehydration. " The initial radiographic and laboratory data are consistent with UTI.    Dessa Phi, DO [facility] [email or web address] Password [email or web address] 03/31/2017, 8:21 PM

## 2017-03-31 NOTE — Progress Notes (Signed)
ANTICOAGULATION CONSULT NOTE - Initial Consult  Pharmacy Consult for Warfarin Indication: atrial fibrillation  No Known Allergies   Vital Signs: Temp: 98 F (36.7 C) (08/15 1945) Temp Source: Oral (08/15 1945) BP: 150/78 (08/15 1945) Pulse Rate: 66 (08/15 1945)  Labs:  Recent Labs  03/31/17 1524  HGB 10.8*  HCT 32.2*  PLT 223  LABPROT 31.8*  INR 3.00  CREATININE QUANTITY NOT SUFFICIENT, UNABLE TO PERFORM TEST    CrCl cannot be calculated (This lab value cannot be used to calculate CrCl because it is not a number: QUANTITY NOT SUFFICIENT, UNABLE TO PERFORM TEST).   Medical History: Past Medical History:  Diagnosis Date  . Atrial fibrillation (HCC)    chronic Coumadin.   Marland Kitchen COPD (chronic obstructive pulmonary disease) ([place])   . Glaucoma   . Hypothyroidism   . Neuropathy    PERIPHERAL  . Osteoarthritis   . Osteoporosis   . Trimalleolar fracture    LEFT TRIMALLEOLAR FRACTURE, DISLOCATION WITH OBLIQUE FRACTURE OF THE DISTAL FIBULAR DIAPHYSIS, AND NOTED BEING MILDLY COMMINUTED     Assessment: 78 y/oF with PMH of atrial fibrillation on warfarin, chronic respiratory failure, combined systolic and diastolic heart failure, HTN, hypothyroidism, chronic pressure ulcers who was seen in ED 03/21/2017 and diagnosed with URI/COPD, UTI-sent home with Doxycycline and Cephalexin. Patient presented to [facility] ED today, 03/31/2017, with increased lethargy, weakness, confusion, decreased PO intake, intermittent lower abdominal/pelvic discomfort, and hematuria. UA c/w UTI. Pharmacy consulted by [facility] to assist with dosing of warfarin while patient in the hospital. INR on admission 3. CBC reveals Hgb 10.8 (stable as compared to previous values), Pltc WNL. Home dose of warfarin reported as 1mg  PO daily, with last dose taken 03/30/2017 at 1800.   Goal of Therapy:  INR 2-3 Monitor platelets by anticoagulation protocol: Yes   Plan:   Warfarin 0.5mg  PO x 1 tonight, as INR on higher end of goal range  and patient with hematuria.   Daily PT/INR.   Monitor CBC and for worsening hematuria/bleeding.    Lindell Spar, PharmD, BCPS Pager: [phone] 03/31/2017 8:43 PM

## 2017-03-31 NOTE — ED Provider Notes (Signed)
[facility] DEPT Provider Note   CSN: [id number] Arrival date & time: 03/31/17  1238     History   Chief Complaint Chief Complaint  Patient presents with  . Hematuria    HPI [name] is a 83 y.o. female.  HPI   82 year old female presents with family with reports of hematuria.  And over the last 2 weeks she has had blood in her urine.  She was seen in the emergency room and diagnosed with urinary tract infection on 03/21/2017.  She was sent home on Keflex and also on doxycycline for respiratory complaints.  They note her respiratory complaints have improved, but notes that her urine has become darker with frank blood.  They note that she is also been altered from her baseline and confused.  They note increased fatigue, decreased appetite as well.  Patient reports that she has no complaints at this time.   Past Medical History:  Diagnosis Date  . Atrial fibrillation (HCC)    chronic Coumadin.   Marland Kitchen COPD (chronic obstructive pulmonary disease) ([place])   . Glaucoma   . Hypothyroidism   . Neuropathy    PERIPHERAL  . Osteoarthritis   . Osteoporosis   . Trimalleolar fracture    LEFT TRIMALLEOLAR FRACTURE, DISLOCATION WITH OBLIQUE FRACTURE OF THE DISTAL FIBULAR DIAPHYSIS, AND NOTED BEING MILDLY COMMINUTED    Patient Active Problem List   Diagnosis Date Noted  . UTI (urinary tract infection) 03/31/2017  . Respiratory distress   . Acute systolic heart failure ([place]) 10/09/2016  . Cardiomyopathy ([place]) 10/09/2016  . Goals of care, counseling/discussion   . Palliative care encounter   . Encounter for hospice care discussion   . Dysphagia   . COPD with acute exacerbation ([place]) 10/04/2016  . Sepsis due to pneumonia ([place]) 10/04/2016  . Acute encephalopathy 10/04/2016  . Debility   . CAP (community acquired pneumonia) 10/03/2016  . AKI (acute kidney injury) ([place]) 10/03/2016  . Hypokalemia 10/03/2016  . Normocytic anemia 10/03/2016  . Acute respiratory failure with hypoxia  ([place]) 10/03/2016  . Chronic diastolic CHF (congestive heart failure) ([place]) 10/03/2016  . Hypotension 10/03/2016  . Atrial fibrillation ([place])   . Hypothyroidism   . Glaucoma   . Neuropathy   . Osteoporosis   . Osteoarthritis   . Fall   . Trimalleolar fracture   . HYPOTHYROIDISM 03/02/2007  . GLAUCOMA 03/02/2007  . ATRIAL FIBRILLATION 03/02/2007  . PSORIASIS 03/02/2007    Past Surgical History:  Procedure Laterality Date  . COLONOSCOPY W/ POLYPECTOMY  11/2010   Dr Fuller Plan.  multiple polyps:cecal, transverse, sigmoid.  largest 89mm, path: tubular adenomas without HGD.  mild sigmoid diverticulosis.   Marland Kitchen ORIF TIBIA & FIBULA FRACTURES  2016  . VAGINAL HYSTERECTOMY      OB History    No data available       Home Medications    Prior to Admission medications   Medication Sig Start Date End Date Taking? Authorizing Provider  albuterol (PROVENTIL) (2.5 MG/3ML) 0.083% nebulizer solution Take 2.5 mg by nebulization every 4 (four) hours as needed for wheezing or shortness of breath.    Yes [provider]  dorzolamide (TRUSOPT) 2 % ophthalmic solution Place 1 drop into the left eye 2 (two) times daily. 09/11/16  Yes [provider]  ergocalciferol (VITAMIN D2) 50000 UNITS capsule Take 50,000 Units by mouth every Thursday.    Yes [provider]  FLUoxetine (PROZAC) 40 MG capsule Take 40 mg by mouth daily. 03/29/17  Yes  [provider]  gabapentin (NEURONTIN) 600 MG tablet Take 600 mg by mouth at bedtime. 03/27/15  Yes [provider]  guaiFENesin (MUCINEX) 600 MG 12 hr tablet Take 600 mg by mouth 2 (two) times daily.   Yes [provider]  levothyroxine (SYNTHROID, LEVOTHROID) 75 MCG tablet Take 75 mcg by mouth daily before breakfast.   Yes [provider]  lisinopril (PRINIVIL,ZESTRIL) 5 MG tablet Take 5 mg by mouth daily. 03/08/17  Yes [provider]  loperamide (IMODIUM) 2 MG capsule Take 4-6 mg by mouth See admin  instructions. Take 2 capsules (4 mg) by mouth every morning, may also take 1 capsule (2 mg) during the day as needed for loose stools    Yes [provider]  Lutein 6 MG CAPS Take 6 mg by mouth daily.   Yes [provider]  methocarbamol (ROBAXIN) 500 MG tablet Take 500 mg by mouth 2 (two) times daily.   Yes [provider]  metoprolol tartrate (LOPRESSOR) 25 MG tablet Take 12.5 mg by mouth 2 (two) times daily.  03/18/17  Yes [provider]  mirtazapine (REMERON) 7.5 MG tablet Take 7.5 mg by mouth every evening. 03/09/17  Yes [provider]  omeprazole (PRILOSEC) 40 MG capsule Take 40 mg by mouth 2 (two) times daily.    Yes [provider]  oxyCODONE-acetaminophen (PERCOCET/ROXICET) 5-325 MG per tablet Take 1-2 tablets by mouth every 4 (four) hours as needed for pain. Patient taking differently: Take 1 tablet by mouth 3 (three) times daily as needed for moderate pain. MAY TAKE 1 ADDITIONAL TABLET ONCE A DAY IF PAIN PERSISTS 05/24/12  Yes Bonk, John-Adam, MD  pilocarpine (PILOCAR) 4 % ophthalmic solution Place 1 drop into the left eye 2 (two) times daily.    Yes [provider]  rOPINIRole (REQUIP) 1 MG tablet Take 1 mg by mouth every evening. 02/11/17  Yes [provider]  spironolactone (ALDACTONE) 25 MG tablet Take 25 mg by mouth daily. 03/18/17  Yes [provider]  timolol (TIMOPTIC) 0.5 % ophthalmic solution Place 1 drop into the left eye 2 (two) times daily. 09/11/16  Yes [provider]  travoprost, benzalkonium, (TRAVATAN) 0.004 % ophthalmic solution Place 1 drop into the left eye at bedtime.    Yes [provider]  warfarin (COUMADIN) 2 MG tablet Take 1 mg by mouth daily with supper.   Yes [provider]  cephALEXin (KEFLEX) 500 MG capsule Take 1 capsule (500 mg total) by mouth 4 (four) times daily. Patient not taking: Reported on 03/31/2017 03/21/17   Varney Biles, MD  doxycycline  (VIBRAMYCIN) 100 MG capsule Take 1 capsule (100 mg total) by mouth 2 (two) times daily. Patient not taking: Reported on 03/31/2017 03/21/17   Varney Biles, MD  furosemide (LASIX) 20 MG tablet Take 1 tablet (20 mg total) by mouth 2 (two) times daily. Patient not taking: Reported on 03/31/2017 07/01/16   Virgel Manifold, MD  metoprolol succinate (TOPROL-XL) 25 MG 24 hr tablet Take 0.5 tablets (12.5 mg total) by mouth daily. Patient not taking: Reported on 03/21/2017 10/15/16   Donne Hazel, MD  OXYGEN Inhale 2.5-3 L into the lungs daily. Continuous    [provider]  potassium chloride (K-DUR) 10 MEQ tablet Take 1 tablet (10 mEq total) by mouth 2 (two) times daily. Patient not taking: Reported on 03/31/2017 07/01/16   Virgel Manifold, MD  predniSONE (DELTASONE) 10 MG tablet Take 6 tablets (60 mg total) by mouth daily.  Patient not taking: Reported on 03/31/2017 03/21/17   Varney Biles, MD    Family History Family History  Problem Relation Age of Onset  . CAD Mother   . Heart failure Father   . Heart failure Brother   . CAD Brother   . Colon cancer Neg Hx   . Stomach cancer Neg Hx     Social History Social History  Substance Use Topics  . Smoking status: Never Smoker  . Smokeless tobacco: Never Used  . Alcohol use No     Allergies   Patient has no known allergies.   Review of Systems Review of Systems  Unable to perform ROS: Mental status change     Physical Exam Updated Vital Signs BP 125/81   Pulse 61   Temp 97.8 F (36.6 C) (Oral)   Resp 16   SpO2 100%   Physical Exam  Constitutional: She is oriented to person, place, and time. She appears well-developed and well-nourished.  HENT:  Head: Normocephalic and atraumatic.  Hematoma and dried blood noted to the posterior scalp, no obvious open lacerations  Eyes: Pupils are equal, round, and reactive to light. Conjunctivae are normal. Right eye exhibits no discharge. Left eye exhibits no discharge. No scleral  icterus.  Neck: Normal range of motion. No JVD present. No tracheal deviation present.  Pulmonary/Chest: Effort normal. No stridor.  Abdominal: Soft. She exhibits no distension and no mass. There is no tenderness. There is no rebound and no guarding. No hernia.  Genitourinary:  Genitourinary Comments: No frank blood on rectal/ no vaginal bleeding on external exam   Musculoskeletal:  No C or T-spine tenderness palpation  Neurological: She is alert and oriented to person, place, and time. Coordination normal.  Psychiatric: She has a normal mood and affect. Her behavior is normal. Judgment and thought content normal.  Nursing note and vitals reviewed.    ED Treatments / Results  Labs (all labs ordered are listed, but only abnormal results are displayed) Labs Reviewed  URINALYSIS, ROUTINE W REFLEX MICROSCOPIC - Abnormal; Notable for the following:       Result Value   Color, Urine RED (*)    APPearance TURBID (*)    Glucose, UA   (*)    Value: TEST NOT REPORTED DUE TO COLOR INTERFERENCE OF URINE PIGMENT   Hgb urine dipstick   (*)    Value: TEST NOT REPORTED DUE TO COLOR INTERFERENCE OF URINE PIGMENT   Bilirubin Urine   (*)    Value: TEST NOT REPORTED DUE TO COLOR INTERFERENCE OF URINE PIGMENT   Ketones, ur   (*)    Value: TEST NOT REPORTED DUE TO COLOR INTERFERENCE OF URINE PIGMENT   Protein, ur   (*)    Value: TEST NOT REPORTED DUE TO COLOR INTERFERENCE OF URINE PIGMENT   Nitrite   (*)    Value: TEST NOT REPORTED DUE TO COLOR INTERFERENCE OF URINE PIGMENT   Leukocytes, UA   (*)    Value: TEST NOT REPORTED DUE TO COLOR INTERFERENCE OF URINE PIGMENT   Bacteria, UA MANY (*)    Squamous Epithelial / LPF 0-5 (*)    All other components within normal limits  CBC WITH DIFFERENTIAL/PLATELET - Abnormal; Notable for the following:    WBC 13.1 (*)    RBC 3.51 (*)    Hemoglobin 10.8 (*)    HCT 32.2 (*)    RDW 16.6 (*)    Neutro Abs 11.0 (*)    Monocytes Absolute 1.2 (*)  All other  components within normal limits  COMPREHENSIVE METABOLIC PANEL - Abnormal; Notable for the following:    Calcium 8.8 (*)    Total Protein 5.8 (*)    Albumin 2.7 (*)    All other components within normal limits  PROTIME-INR - Abnormal; Notable for the following:    Prothrombin Time 31.8 (*)    All other components within normal limits  URINE CULTURE  CULTURE, BLOOD (ROUTINE X 2)  CULTURE, BLOOD (ROUTINE X 2)    EKG  EKG Interpretation None       Radiology No results found.  Procedures Procedures (including critical care time)  Medications Ordered in ED Medications  sodium chloride 0.9 % bolus 1,000 mL (not administered)  cefTRIAXone (ROCEPHIN) 1 g in dextrose 5 % 50 mL IVPB (not administered)     Initial Impression / Assessment and Plan / ED Course  I have reviewed the triage vital signs and the nursing notes.  Pertinent labs & imaging results that were available during my care of the patient were reviewed by me and considered in my medical decision making (see chart for details).     Final Clinical Impressions(s) / ED Diagnoses   Final diagnoses:  Urinary tract infection with hematuria, site unspecified   Labs: CBC, CMP PT/INR, urinalysis  Imaging:  Consults: [facility]  Therapeutics: Ceftriaxone  Discharge Meds:   Assessment/Plan: 83 year old female presents today with hematuria likely secondary to urinary tract infection.  Patient also with slightly elevated INR 3.0.  Patient is well-appearing, she does have elevated white count, some confusion, and failed outpatient therapy.  Hospitalist service will be consulted for admission.    New Prescriptions New Prescriptions   No medications on file     Francee Gentile 03/31/17 Earl Gala, MD 04/02/17 1030

## 2017-03-31 NOTE — ED Provider Notes (Signed)
  Face-to-face evaluation   History: She is here for altered mental status, weakness, decreased appetite, and general malaise.  Recently treated for respiratory infection, and continues to cough.  Physical exam: Alert elderly patient who is comfortable.  Mouth slightly dry mucous membranes.  Abdomen soft and nontender.  Medical screening examination/treatment/procedure(s) were conducted as a shared visit with non-physician practitioner(s) and myself.  I personally evaluated the patient during the encounter    Daleen Bo, MD 04/02/17 1031

## 2017-04-01 ENCOUNTER — Inpatient Hospital Stay (HOSPITAL_COMMUNITY): Payer: Medicare Other

## 2017-04-01 ENCOUNTER — Observation Stay (HOSPITAL_BASED_OUTPATIENT_CLINIC_OR_DEPARTMENT_OTHER)
Admit: 2017-04-01 | Discharge: 2017-04-01 | Disposition: A | Discharge status: Home / Self Care | Payer: Medicare Other | Attending: Internal Medicine | Admitting: Internal Medicine

## 2017-04-01 ENCOUNTER — Observation Stay (HOSPITAL_COMMUNITY): Payer: Medicare Other

## 2017-04-01 DIAGNOSIS — R1310 Dysphagia, unspecified: Secondary | ICD-10-CM | POA: Diagnosis present

## 2017-04-01 DIAGNOSIS — R410 Disorientation, unspecified: Secondary | ICD-10-CM | POA: Diagnosis not present

## 2017-04-01 DIAGNOSIS — I5032 Chronic diastolic (congestive) heart failure: Secondary | ICD-10-CM | POA: Diagnosis not present

## 2017-04-01 DIAGNOSIS — I482 Chronic atrial fibrillation: Secondary | ICD-10-CM | POA: Diagnosis present

## 2017-04-01 DIAGNOSIS — G92 Toxic encephalopathy: Secondary | ICD-10-CM | POA: Diagnosis present

## 2017-04-01 DIAGNOSIS — G629 Polyneuropathy, unspecified: Secondary | ICD-10-CM | POA: Diagnosis present

## 2017-04-01 DIAGNOSIS — Z1612 Extended spectrum beta lactamase (ESBL) resistance: Secondary | ICD-10-CM | POA: Diagnosis present

## 2017-04-01 DIAGNOSIS — E872 Acidosis: Secondary | ICD-10-CM | POA: Diagnosis present

## 2017-04-01 DIAGNOSIS — J90 Pleural effusion, not elsewhere classified: Secondary | ICD-10-CM | POA: Diagnosis not present

## 2017-04-01 DIAGNOSIS — R55 Syncope and collapse: Secondary | ICD-10-CM | POA: Diagnosis not present

## 2017-04-01 DIAGNOSIS — R6520 Severe sepsis without septic shock: Secondary | ICD-10-CM | POA: Diagnosis not present

## 2017-04-01 DIAGNOSIS — R1314 Dysphagia, pharyngoesophageal phase: Secondary | ICD-10-CM | POA: Diagnosis not present

## 2017-04-01 DIAGNOSIS — N3000 Acute cystitis without hematuria: Secondary | ICD-10-CM

## 2017-04-01 DIAGNOSIS — R319 Hematuria, unspecified: Secondary | ICD-10-CM | POA: Diagnosis not present

## 2017-04-01 DIAGNOSIS — Z452 Encounter for adjustment and management of vascular access device: Secondary | ICD-10-CM | POA: Diagnosis not present

## 2017-04-01 DIAGNOSIS — R471 Dysarthria and anarthria: Secondary | ICD-10-CM | POA: Diagnosis not present

## 2017-04-01 DIAGNOSIS — G9340 Encephalopathy, unspecified: Secondary | ICD-10-CM | POA: Diagnosis not present

## 2017-04-01 DIAGNOSIS — X58XXXA Exposure to other specified factors, initial encounter: Secondary | ICD-10-CM | POA: Diagnosis present

## 2017-04-01 DIAGNOSIS — N179 Acute kidney failure, unspecified: Secondary | ICD-10-CM | POA: Diagnosis not present

## 2017-04-01 DIAGNOSIS — J9610 Chronic respiratory failure, unspecified whether with hypoxia or hypercapnia: Secondary | ICD-10-CM | POA: Diagnosis present

## 2017-04-01 DIAGNOSIS — E43 Unspecified severe protein-calorie malnutrition: Secondary | ICD-10-CM | POA: Insufficient documentation

## 2017-04-01 DIAGNOSIS — E039 Hypothyroidism, unspecified: Secondary | ICD-10-CM | POA: Diagnosis present

## 2017-04-01 DIAGNOSIS — H409 Unspecified glaucoma: Secondary | ICD-10-CM | POA: Diagnosis present

## 2017-04-01 DIAGNOSIS — R41841 Cognitive communication deficit: Secondary | ICD-10-CM | POA: Diagnosis not present

## 2017-04-01 DIAGNOSIS — J9600 Acute respiratory failure, unspecified whether with hypoxia or hypercapnia: Secondary | ICD-10-CM | POA: Diagnosis not present

## 2017-04-01 DIAGNOSIS — R278 Other lack of coordination: Secondary | ICD-10-CM | POA: Diagnosis not present

## 2017-04-01 DIAGNOSIS — J811 Chronic pulmonary edema: Secondary | ICD-10-CM | POA: Diagnosis not present

## 2017-04-01 DIAGNOSIS — I110 Hypertensive heart disease with heart failure: Secondary | ICD-10-CM | POA: Diagnosis present

## 2017-04-01 DIAGNOSIS — N390 Urinary tract infection, site not specified: Secondary | ICD-10-CM | POA: Diagnosis not present

## 2017-04-01 DIAGNOSIS — M6281 Muscle weakness (generalized): Secondary | ICD-10-CM | POA: Diagnosis not present

## 2017-04-01 DIAGNOSIS — I5043 Acute on chronic combined systolic (congestive) and diastolic (congestive) heart failure: Secondary | ICD-10-CM | POA: Diagnosis present

## 2017-04-01 DIAGNOSIS — G9341 Metabolic encephalopathy: Secondary | ICD-10-CM

## 2017-04-01 DIAGNOSIS — I959 Hypotension, unspecified: Secondary | ICD-10-CM | POA: Diagnosis not present

## 2017-04-01 DIAGNOSIS — L89150 Pressure ulcer of sacral region, unstageable: Secondary | ICD-10-CM | POA: Diagnosis present

## 2017-04-01 DIAGNOSIS — R531 Weakness: Secondary | ICD-10-CM | POA: Diagnosis not present

## 2017-04-01 DIAGNOSIS — R29810 Facial weakness: Secondary | ICD-10-CM | POA: Diagnosis not present

## 2017-04-01 DIAGNOSIS — R064 Hyperventilation: Secondary | ICD-10-CM | POA: Diagnosis not present

## 2017-04-01 DIAGNOSIS — R0602 Shortness of breath: Secondary | ICD-10-CM | POA: Diagnosis not present

## 2017-04-01 DIAGNOSIS — J449 Chronic obstructive pulmonary disease, unspecified: Secondary | ICD-10-CM | POA: Diagnosis present

## 2017-04-01 DIAGNOSIS — Z7189 Other specified counseling: Secondary | ICD-10-CM | POA: Diagnosis not present

## 2017-04-01 DIAGNOSIS — R4182 Altered mental status, unspecified: Secondary | ICD-10-CM | POA: Diagnosis not present

## 2017-04-01 DIAGNOSIS — M810 Age-related osteoporosis without current pathological fracture: Secondary | ICD-10-CM | POA: Diagnosis present

## 2017-04-01 DIAGNOSIS — L8990 Pressure ulcer of unspecified site, unspecified stage: Secondary | ICD-10-CM | POA: Insufficient documentation

## 2017-04-01 DIAGNOSIS — A4159 Other Gram-negative sepsis: Secondary | ICD-10-CM | POA: Diagnosis present

## 2017-04-01 DIAGNOSIS — E860 Dehydration: Secondary | ICD-10-CM | POA: Diagnosis present

## 2017-04-01 DIAGNOSIS — R6521 Severe sepsis with septic shock: Secondary | ICD-10-CM | POA: Diagnosis present

## 2017-04-01 DIAGNOSIS — B964 Proteus (mirabilis) (morganii) as the cause of diseases classified elsewhere: Secondary | ICD-10-CM | POA: Diagnosis present

## 2017-04-01 LAB — BASIC METABOLIC PANEL
ANION GAP: 10 (ref 5–15)
BUN: 22 mg/dL — ABNORMAL HIGH (ref 6–20)
CALCIUM: 8 mg/dL — AB (ref 8.9–10.3)
CO2: 21 mmol/L — ABNORMAL LOW (ref 22–32)
Chloride: 107 mmol/L (ref 101–111)
Creatinine, Ser: 1.2 mg/dL — ABNORMAL HIGH (ref 0.44–1.00)
GFR, EST AFRICAN AMERICAN: 45 mL/min — AB (ref >60)
GFR, EST NON AFRICAN AMERICAN: 39 mL/min — AB (ref >60)
GLUCOSE: 75 mg/dL (ref 65–99)
Potassium: 3.7 mmol/L (ref 3.5–5.1)
Sodium: 138 mmol/L (ref 135–145)

## 2017-04-01 LAB — BLOOD CULTURE ID PANEL (REFLEXED)
Acinetobacter baumannii: NOT DETECTED
Candida albicans: NOT DETECTED
Candida glabrata: NOT DETECTED
Candida krusei: NOT DETECTED
Candida parapsilosis: NOT DETECTED
Candida tropicalis: NOT DETECTED
Carbapenem resistance: NOT DETECTED
Enterobacter cloacae complex: NOT DETECTED
Enterobacteriaceae species: DETECTED — AB
Enterococcus species: NOT DETECTED
Escherichia coli: NOT DETECTED
Haemophilus influenzae: NOT DETECTED
Klebsiella oxytoca: NOT DETECTED
Klebsiella pneumoniae: NOT DETECTED
Listeria monocytogenes: NOT DETECTED
Methicillin resistance: NOT DETECTED
Neisseria meningitidis: NOT DETECTED
Proteus species: DETECTED — AB
Pseudomonas aeruginosa: NOT DETECTED
Serratia marcescens: NOT DETECTED
Staphylococcus aureus (BCID): NOT DETECTED
Staphylococcus species: NOT DETECTED
Streptococcus agalactiae: NOT DETECTED
Streptococcus pneumoniae: NOT DETECTED
Streptococcus pyogenes: NOT DETECTED
Streptococcus species: NOT DETECTED
Vancomycin resistance: NOT DETECTED

## 2017-04-01 LAB — BLOOD GAS, ARTERIAL
Acid-base deficit: 2.3 mmol/L — ABNORMAL HIGH (ref 0.0–2.0)
BICARBONATE: 22 mmol/L (ref 20.0–28.0)
Drawn by: 311011
O2 Content: 4 L/min
O2 Saturation: 91.4 %
PATIENT TEMPERATURE: 97.6
PH ART: 7.389 (ref 7.350–7.450)
PO2 ART: 61 mmHg — AB (ref 83.0–108.0)
pCO2 arterial: 37 mmHg (ref 32.0–48.0)

## 2017-04-01 LAB — DIFFERENTIAL
BASOS ABS: 0 10*3/uL (ref 0.0–0.1)
Basophils Relative: 0 %
EOS ABS: 0 10*3/uL (ref 0.0–0.7)
EOS PCT: 0 %
LYMPHS PCT: 1 %
Lymphs Abs: 0.2 10*3/uL — ABNORMAL LOW (ref 0.7–4.0)
Monocytes Absolute: 1.7 10*3/uL — ABNORMAL HIGH (ref 0.1–1.0)
Monocytes Relative: 8 %
NEUTROS ABS: 18.9 10*3/uL — AB (ref 1.7–7.7)
Neutrophils Relative %: 91 %

## 2017-04-01 LAB — COMPREHENSIVE METABOLIC PANEL
ALBUMIN: 2.7 g/dL — AB (ref 3.5–5.0)
ALK PHOS: 81 U/L (ref 38–126)
ALT: 22 U/L (ref 14–54)
AST: 24 U/L (ref 15–41)
Anion gap: 8 (ref 5–15)
BUN: 21 mg/dL — ABNORMAL HIGH (ref 6–20)
CHLORIDE: 106 mmol/L (ref 101–111)
CO2: 25 mmol/L (ref 22–32)
CREATININE: 1.33 mg/dL — AB (ref 0.44–1.00)
Calcium: 8.1 mg/dL — ABNORMAL LOW (ref 8.9–10.3)
GFR calc non Af Amer: 35 mL/min — ABNORMAL LOW (ref >60)
GFR, EST AFRICAN AMERICAN: 40 mL/min — AB (ref >60)
GLUCOSE: 90 mg/dL (ref 65–99)
Potassium: 3.8 mmol/L (ref 3.5–5.1)
SODIUM: 139 mmol/L (ref 135–145)
Total Bilirubin: 0.6 mg/dL (ref 0.3–1.2)
Total Protein: 5.7 g/dL — ABNORMAL LOW (ref 6.5–8.1)

## 2017-04-01 LAB — CBC
HCT: 26.4 % — ABNORMAL LOW (ref 36.0–46.0)
Hemoglobin: 8.9 g/dL — ABNORMAL LOW (ref 12.0–15.0)
MCH: 31.2 pg (ref 26.0–34.0)
MCHC: 33.7 g/dL (ref 30.0–36.0)
MCV: 92.6 fL (ref 78.0–100.0)
PLATELETS: 177 10*3/uL (ref 150–400)
RBC: 2.85 MIL/uL — ABNORMAL LOW (ref 3.87–5.11)
RDW: 16.7 % — AB (ref 11.5–15.5)
WBC: 20.8 10*3/uL — AB (ref 4.0–10.5)

## 2017-04-01 LAB — GLUCOSE, CAPILLARY: Glucose-Capillary: 83 mg/dL (ref 65–99)

## 2017-04-01 LAB — PROTIME-INR
INR: 3
Prothrombin Time: 31.8 seconds — ABNORMAL HIGH (ref 11.4–15.2)

## 2017-04-01 LAB — LACTIC ACID, PLASMA
Lactic Acid, Venous: 2.1 mmol/L (ref 0.5–1.9)
Lactic Acid, Venous: 1.8 mmol/L (ref 0.5–1.9)

## 2017-04-01 LAB — MRSA PCR SCREENING: MRSA by PCR: NEGATIVE

## 2017-04-01 LAB — PROCALCITONIN: PROCALCITONIN: 8.53 ng/mL

## 2017-04-01 LAB — TROPONIN I

## 2017-04-01 MED ORDER — SODIUM CHLORIDE 0.9 % IV SOLN: INTRAVENOUS | Status: DC

## 2017-04-01 MED ORDER — PHENYLEPHRINE HCL-NACL 10-0.9 MG/250ML-% IV SOLN
INTRAVENOUS | Status: AC
  Administered 2017-04-01: 50 mg
  Filled 2017-04-01: qty 250

## 2017-04-01 MED ORDER — PHENYLEPHRINE HCL-NACL 10-0.9 MG/250ML-% IV SOLN
INTRAVENOUS | Status: AC
  Administered 2017-04-01: 60 mg
  Filled 2017-04-01: qty 250

## 2017-04-01 MED ORDER — SODIUM CHLORIDE 0.9% FLUSH
10.0000 mL | INTRAVENOUS | Status: DC | PRN
  Administered 2017-04-09: 10 mL
  Administered 2017-04-09: 20 mL
  Filled 2017-04-01 (×2): qty 40

## 2017-04-01 MED ORDER — DEXTROSE 5 % IV SOLN
2.0000 g | INTRAVENOUS | Status: DC
  Administered 2017-04-01 – 2017-04-02 (×2): 2 g via INTRAVENOUS
  Filled 2017-04-01 (×3): qty 2

## 2017-04-01 MED ORDER — SODIUM CHLORIDE 0.9 % IV BOLUS (SEPSIS)
1000.0000 mL | Freq: Once | INTRAVENOUS | Status: AC
  Administered 2017-04-01: 1000 mL via INTRAVENOUS

## 2017-04-01 MED ORDER — LEVOTHYROXINE SODIUM 100 MCG IV SOLR
37.5000 ug | Freq: Every day | INTRAVENOUS | Status: DC
  Administered 2017-04-01 – 2017-04-02 (×2): 37.5 ug via INTRAVENOUS
  Filled 2017-04-01 (×2): qty 5

## 2017-04-01 MED ORDER — CHLORHEXIDINE GLUCONATE CLOTH 2 % EX PADS
6.0000 | MEDICATED_PAD | Freq: Every day | CUTANEOUS | Status: DC
  Administered 2017-04-01 – 2017-04-08 (×7): 6 via TOPICAL

## 2017-04-01 MED ORDER — FUROSEMIDE 10 MG/ML IJ SOLN: 40.0000 mg | Freq: Two times a day (BID) | INTRAMUSCULAR | Status: DC

## 2017-04-01 MED ORDER — PHENYLEPHRINE HCL-NACL 10-0.9 MG/250ML-% IV SOLN
INTRAVENOUS | Status: AC
  Administered 2017-04-01: 40 ug/min
  Filled 2017-04-01: qty 250

## 2017-04-01 MED ORDER — MORPHINE SULFATE (PF) 2 MG/ML IV SOLN: 1.0000 mg | INTRAVENOUS | Status: DC | PRN

## 2017-04-01 MED ORDER — PHENYLEPHRINE HCL-NACL 10-0.9 MG/250ML-% IV SOLN
INTRAVENOUS | Status: AC
  Filled 2017-04-01: qty 250

## 2017-04-01 MED ORDER — FUROSEMIDE 10 MG/ML IJ SOLN: 20.0000 mg | Freq: Once | INTRAMUSCULAR | Status: DC

## 2017-04-01 MED ORDER — PANTOPRAZOLE SODIUM 40 MG IV SOLR
40.0000 mg | INTRAVENOUS | Status: DC
  Administered 2017-04-01 – 2017-04-04 (×4): 40 mg via INTRAVENOUS
  Filled 2017-04-01 (×4): qty 40

## 2017-04-01 MED ORDER — COLLAGENASE 250 UNIT/GM EX OINT
TOPICAL_OINTMENT | Freq: Every day | CUTANEOUS | Status: DC
  Administered 2017-04-01 – 2017-04-09 (×8): via TOPICAL
  Filled 2017-04-01 (×2): qty 90

## 2017-04-01 MED ORDER — SODIUM CHLORIDE 0.9 % IV SOLN
0.0000 ug/min | INTRAVENOUS | Status: DC
  Administered 2017-04-01: 20 ug/min via INTRAVENOUS
  Administered 2017-04-01: 40 ug/min via INTRAVENOUS
  Administered 2017-04-01: 30 ug/min via INTRAVENOUS
  Filled 2017-04-01: qty 1

## 2017-04-01 MED ORDER — FUROSEMIDE 10 MG/ML IJ SOLN
20.0000 mg | Freq: Once | INTRAMUSCULAR | Status: AC
  Administered 2017-04-01: 20 mg via INTRAVENOUS
  Filled 2017-04-01: qty 2

## 2017-04-01 MED ORDER — VITAMINS A & D EX OINT
TOPICAL_OINTMENT | CUTANEOUS | Status: AC
  Administered 2017-04-01: 12:00:00
  Filled 2017-04-01: qty 5

## 2017-04-01 NOTE — Progress Notes (Signed)
PROGRESS NOTE    [name]  QQP:[id number] DOB: [birth year] DOA: 03/31/2017 PCP: Tamsen Roers, MD     Brief Narrative:  [name] is a 83 y.o. female with medical history significant of atrial fib on coumadin, chronic respiratory failure on 2.5-3L at baseline O2, chronic combined systolic and diastolic heart failure, HTN, hypothyroidism, and chronic pressure ulcers. She was evaluated in the ED a week ago for confusion and lethargy, was diagnosed with URI/COPD exacerbation as well as UTI. She was given steroids, doxycyline and keflex and sent home. Since then, she has not gotten any better. She has continued to be lethargic, not acting her normal self, weak, confused, with decreased PO intake. At baseline, she does not ambulate. She is able to assist in transferring to motorized wheelchair.   Overnight on 8/15, she became more short of breath with labored breathing. Chest x-ray was completed which did show pulmonary edema. She was given 20 mg of IV Lasix. During nursing evaluation, patient was seen leaning to the left, left facial droop, more difficult to arouse, unequal pupils. Rapid response was called. Patient underwent CT head stat which was unremarkable for acute etiology. During CT, patient's blood pressure dropped to the 60s. She was given 1 L fluid bolus and transferred to ICU, PCCM was consulted, patient started on vasopressor. Neurology was consulted over the phone, they recommended MRI, MRA brain.   Assessment & Plan:   Principal Problem:   Acute metabolic encephalopathy Active Problems:   UTI (urinary tract infection)   Pressure injury of skin  Acute toxic encephalopathy -Treat infection as below  Septic shock secondary to UTI, POA -Failure of outpatient treatment with Keflex -Urine culture pending -Blood culture pending -Continue Rocephin -PCCM consulted overnight, given 1L IVF, vasopressor. Now off vasopressor this morning.   Acute neurologic change -Left facial  droop, unequal pupils (per daughter, patient's pupils are unequal at baseline with L>R due to glaucoma. However on my examination, pupils are R>L which is the opposite of her baseline pupil difference)  -CT head overnight without acute etiology -Overnight team spoke with neurology on call who recommended MRI, MRA to r/o stroke -EEG completed which showed diffuse cerebral dysfunction that is non-specific in etiology and can be seen with hypoxic/ischemic injury, toxic/metabolic encephalopathies, neurodegenerative disorders, or medication effect  Acute on chronic systolic and diastolic heart failure -EF 30-35% -Follows with Dr. Einar Gip -Given IVF overnight due to sepsis and hypotension, now with increased work of breathing, repeat CXR with worsening bilateral pleural effusions and vascular congestion. Lasix 20mg  IV given overnight. -Spoke with Dr. Einar Gip over the phone this morning. He recommends lasix 40mg  IV BID and continue to monitor. If without improvement, will formally consult  -Repeat CXR in AM   Chronic atrial fibrillation -Coumadin per pharmacy  Chronic respiratory failure -2.5-3L  O2 at baseline   Chronic pressure ulcers on her coccyx, bilateral heels, right lateral malleoli -Does not appear acutely infected -Turn every 2 hours -Wound RN consult  Hypertension -Hold antihypertensives in setting of shock. Monitor BP.   Hypothyroidism -Continue Synthroid  Hematuria -In setting of UTI and coumadin use. Resolved.    DVT prophylaxis: coumadin Code Status: full, confirmed with daughter Family Communication: daughter at bedside Disposition Plan: pending improvement, stabilization   Consultants:   PCCM  Neurology over the phone overnight  Procedures:   None  Antimicrobials:  Anti-infectives    Start     Dose/Rate Route Frequency Ordered Stop   04/01/17 1800  cefTRIAXone (ROCEPHIN) 1  g in dextrose 5 % 50 mL IVPB     1 g 100 mL/hr over 30 Minutes Intravenous  Every 24 hours 03/31/17 1950     03/31/17 1630  cefTRIAXone (ROCEPHIN) 1 g in dextrose 5 % 50 mL IVPB     1 g 100 mL/hr over 30 Minutes Intravenous  Once 03/31/17 1617 03/31/17 2038       Subjective: Moaning in bed, does not answer questions.    Objective: Vitals:   04/01/17 0515 04/01/17 0530 04/01/17 0600 04/01/17 0800  BP: (!) 93/34 (!) 94/47 (!) 110/54 (!) 175/117  Pulse: 71 70 62 78  Resp: (!) 28 16 (!) 23 (!) 31  Temp:    98.1 F (36.7 C)  TempSrc:    Oral  SpO2: 99% 100% 100% 92%  Weight:      Height:        Intake/Output Summary (Last 24 hours) at 04/01/17 1119 Last data filed at 04/01/17 0747  Gross per 24 hour  Intake          1574.95 ml  Output              280 ml  Net          1294.95 ml   Filed Weights   03/31/17 2220 04/01/17 0145  Weight: 59.7 kg (131 lb 9.8 oz) 61.2 kg (134 lb 14.7 oz)    Examination:  General exam: Appears uncomfortable, moaning in bed   Respiratory system: Bilateral crackles. Respiratory effort increased, RR 30. Cardiovascular system: S1 & S2 heard, irreg rhythm. No JVD, murmurs, rubs, gallops or clicks. No pedal edema. Gastrointestinal system: Abdomen is nondistended, soft and nontender. No organomegaly or masses felt. Normal bowel sounds heard. Central nervous system: Alert but does not answer questions. Moves bilateral upper extremities spontaneously but not to command, able to wiggle toes on left but does not follow further commands, R pupil > L pupil, reactive to light bilaterally  Extremities: Symmetric in appearance  Skin: +bilateral heel ulcers, +right lateral malleolar ulcer with screw visible. No acute infection.   Data Reviewed: I have personally reviewed following labs and imaging studies  CBC:  Recent Labs Lab 03/31/17 1524 04/01/17 0200  WBC 13.1* 20.8*  NEUTROABS 11.0* 18.9*  HGB 10.8* 8.9*  HCT 32.2* 26.4*  MCV 91.7 92.6  PLT 223 099   Basic Metabolic Panel:  Recent Labs Lab 03/31/17 1524  04/01/17 0200 04/01/17 0702  NA 137 138 139  K 4.4 3.7 3.8  CL 103 107 106  CO2 24 21* 25  GLUCOSE 82 75 90  BUN QUANTITY NOT SUFFICIENT, UNABLE TO PERFORM TEST 22* 21*  CREATININE QUANTITY NOT SUFFICIENT, UNABLE TO PERFORM TEST 1.20* 1.33*  CALCIUM 8.8* 8.0* 8.1*   GFR: Estimated Creatinine Clearance: 28.2 mL/min (A) (by C-G formula based on SCr of 1.33 mg/dL (H)). Liver Function Tests:  Recent Labs Lab 03/31/17 1524 04/01/17 0702  AST 23 24  ALT 22 22  ALKPHOS 70 81  BILITOT 0.7 0.6  PROT 5.8* 5.7*  ALBUMIN 2.7* 2.7*   No results for input(s): LIPASE, AMYLASE in the last 168 hours. No results for input(s): AMMONIA in the last 168 hours. Coagulation Profile:  Recent Labs Lab 03/31/17 1524 04/01/17 0200  INR 3.00 3.00   Cardiac Enzymes:  Recent Labs Lab 04/01/17 0214  TROPONINI <0.03   BNP (last 3 results) No results for input(s): PROBNP in the last 8760 hours. HbA1C: No results for input(s): HGBA1C in the last 72  hours. CBG:  Recent Labs Lab 04/01/17 0100  GLUCAP 83   Lipid Profile: No results for input(s): CHOL, HDL, LDLCALC, TRIG, CHOLHDL, LDLDIRECT in the last 72 hours. Thyroid Function Tests: No results for input(s): TSH, T4TOTAL, FREET4, T3FREE, THYROIDAB in the last 72 hours. Anemia Panel: No results for input(s): VITAMINB12, FOLATE, FERRITIN, TIBC, IRON, RETICCTPCT in the last 72 hours. Sepsis Labs:  Recent Labs Lab 04/01/17 0214 04/01/17 0305 04/01/17 0700  PROCALCITON 8.53  --   --   LATICACIDVEN  --  2.1* 1.8    Recent Results (from the past 240 hour(s))  Blood culture (routine x 2)     Status: None (Preliminary result)   Collection Time: 03/31/17  6:26 PM  Result Value Ref Range Status   Specimen Description BLOOD LEFT ANTECUBITAL  Final   Special Requests   Final    IN PEDIATRIC BOTTLE Blood Culture results may not be optimal due to an excessive volume of blood received in culture bottles   Culture  Setup Time   Final     GRAM NEGATIVE RODS IN PEDIATRIC BOTTLE Organism ID to follow    Culture   Final    NO GROWTH < 12 HOURS Performed at [facility], [place] [address] [place], [place] [address]    Report Status PENDING  Incomplete  Blood culture (routine x 2)     Status: None (Preliminary result)   Collection Time: 03/31/17  6:43 PM  Result Value Ref Range Status   Specimen Description BLOOD RIGHT ARM  Final   Special Requests   Final    IN PEDIATRIC BOTTLE Blood Culture results may not be optimal due to an excessive volume of blood received in culture bottles   Culture   Final    NO GROWTH < 12 HOURS Performed at [facility], [place] [address], [place], [place] [address]    Report Status PENDING  Incomplete  MRSA PCR Screening     Status: None   Collection Time: 04/01/17  4:32 AM  Result Value Ref Range Status   MRSA by PCR NEGATIVE NEGATIVE Final    Comment:        The GeneXpert MRSA Assay (FDA approved for NASAL specimens only), is one component of a comprehensive MRSA colonization surveillance program. It is not intended to diagnose MRSA infection nor to guide or monitor treatment for MRSA infections.        Radiology Studies: Dg Chest Port 1 View  Result Date: 04/01/2017 CLINICAL DATA:  Shortness of breath EXAM: PORTABLE CHEST 1 VIEW COMPARISON:  03/31/2017 FINDINGS: Increased bilateral pleural effusions, left greater than right. Cardiomegaly with central congestion and hazy pulmonary opacities suggesting edema. Bibasilar atelectasis or pneumonia. Aortic atherosclerosis. IMPRESSION: Worsened bilateral pleural effusions. Cardiomegaly with increased vascular congestion and pulmonary edema. Increased bibasilar opacity may reflect atelectasis or pneumonia. Electronically Signed   By: Donavan Foil M.D.   On: 04/01/2017 03:34   Dg Chest Port 1 View  Result Date: 03/31/2017 CLINICAL DATA:  Pulmonary edema.  Fatigue and decreased appetite. EXAM: PORTABLE CHEST 1 VIEW COMPARISON:   Frontal and lateral views 03/21/2017 FINDINGS: Unchanged cardiomegaly with tortuous atherosclerotic thoracic aorta. Decreased pulmonary edema from prior exam. Decreased bilateral pleural effusions with persistent small volume pleural fluid bilaterally. Likely associated compressive atelectasis. No new focal airspace disease. The bones are under mineralized. IMPRESSION: 1. Decreased pulmonary edema and pleural effusions from prior exam. 2. Stable cardiomegaly and tortuous atherosclerotic thoracic aorta. Electronically Signed  By: Jeb Levering M.D.   On: 03/31/2017 20:21   Ct Head Code Stroke Wo Contrast  Result Date: 04/01/2017 CLINICAL DATA:  Code stroke. Unresponsive. History of atrial fibrillation. EXAM: CT HEAD WITHOUT CONTRAST TECHNIQUE: Contiguous axial images were obtained from the base of the skull through the vertex without intravenous contrast. COMPARISON:  None available, examination interpretedduring PACS downtime. FINDINGS: BRAIN: No intraparenchymal hemorrhage, mass effect nor midline shift. The ventricles and sulci are normal for age. Confluent supratentorial white matter hypodensities. Chronic appearing bilateral basal ganglia lacunar infarcts. Mild RIGHT cerebral peduncle volume loss compatible little wallerian degeneration. No acute large vascular territory infarcts. No abnormal extra-axial fluid collections. Basal cisterns are patent. VASCULAR: Moderate calcific atherosclerosis of the carotid siphons. SKULL: No skull fracture. No significant scalp soft tissue swelling. SINUSES/ORBITS: The mastoid air-cells and included paranasal sinuses are well-aerated.The included ocular globes and orbital contents are non-suspicious. Status post bilateral ocular lens implants. OTHER: Patient is edentulous. ASPECTS [facility] Stroke Program Early CT Score) - Ganglionic level infarction (caudate, lentiform nuclei, internal capsule, insula, M1-M3 cortex): 7 - Supraganglionic infarction (M4-M6 cortex): 3  Total score (0-10 with 10 being normal): 10 IMPRESSION: 1. No acute intracranial process. 2. Moderate to severe chronic small vessel ischemic disease. Chronic appearing lacunar infarcts. 3. ASPECTS is 10. 4. Critical Value/emergent results were called by telephone at the time of interpretation on 04/01/2017 at 1:30 am to Dr. Cheral Marker, Neurology, who verbally acknowledged these results. Electronically Signed   By: Elon Alas M.D.   On: 04/01/2017 01:34      Scheduled Meds: . vitamin A & D      . dorzolamide  1 drop Left Eye BID  . feeding supplement (ENSURE ENLIVE)  237 mL Oral BID BM  . FLUoxetine  40 mg Oral Daily  . latanoprost  1 drop Left Eye QHS  . levothyroxine  37.5 mcg Intravenous Daily  . mirtazapine  7.5 mg Oral QHS  . pantoprazole (PROTONIX) IV  40 mg Intravenous Q24H  . pilocarpine  1 drop Left Eye BID  . rOPINIRole  1 mg Oral QHS  . timolol  1 drop Left Eye BID  . Warfarin - Pharmacist Dosing Inpatient   Does not apply q1800   Continuous Infusions: . cefTRIAXone (ROCEPHIN)  IV    . phenylephrine (NEO-SYNEPHRINE) Adult infusion Stopped (04/01/17 0747)     LOS: 0 days    Time spent: 40 minutes   Dessa Phi, DO [facility] [email or web address] Password [email or web address] 04/01/2017, 11:19 AM

## 2017-04-01 NOTE — Progress Notes (Signed)
[facility] Progress Note Patient Name: [name] DOB: Jan 28, 1927 MRN: [id number]   Date of Service  04/01/2017  HPI/Events of Note  Asked to review CXR for PICC line placememeny  eICU Interventions  Can withdraw by 3-4 cm     Intervention Category Evaluation Type: Other  Michaeleen Down 04/01/2017, 9:48 PM

## 2017-04-01 NOTE — Consult Note (Signed)
Reason for consult: stroke like symptoms and hypotension  In summary: 83 yo F was admitted originally to the floor for UTI. Developed Rt sided facial droop and dysarthria for which had STAT ct of the head that was low suspicious for stroke.Patient found to be hypotensive, transferred to the ICU and started on phenylephrine  Patient is sleepy but arousable. No obious facial droop, pupils are unequal at baseline Basal crackles at the lungs S1S2 irregular  Abdomen soft not tender positive pulses  I reviewed labs and imaging  I am managing the patient for the following 1. septic shock 2. UTI 3. Lactic acidosis 4. Stroke like symptoms  ,Monitor in the ICU, q1hr neur check. Neuro consult.Received fluid boluses, follow the trend on lactic acid. Patient already on abx. Follow cultures.   Samul Dada, MD CCM attending

## 2017-04-01 NOTE — Progress Notes (Signed)
Pt sounds congested compared to previous assessment at the beginning of shift. Pt continues to have labored breathing while oxygen saturation of 97% on 3L via [place]. Pt denies SOB or difficulty breathing. Respiratory paged to assess pt and up to assess pt. Respiratory suggested nasotracheal suctioning. Dr. Hal Hope paged. Will continue to monitor pt closely.

## 2017-04-01 NOTE — Evaluation (Addendum)
SLP Cancellation Note  Patient Details Name: [name] MRN: [id number] DOB: 1927/01/23   Cancelled treatment:       Reason Eval/Treat Not Completed: Other (comment) (Per RN, pt for MRI soon, will continue efforts, has h/o known esophageal dysmotility and distal esophageal stricture 11/04/16 esophagram)  [name], MS [facility] SLP [phone]  [name] 04/01/2017, 1:18 PM

## 2017-04-01 NOTE — Progress Notes (Signed)
ANTICOAGULATION CONSULT NOTE - Follow Up Consult  Pharmacy Consult for Warfarin Indication: atrial fibrillation  No Known Allergies   Vital Signs: Temp: 98.1 F (36.7 C) (08/16 0800) Temp Source: Oral (08/16 0800) BP: 71/31 (08/16 1200) Pulse Rate: 63 (08/16 1200)  Labs:  Recent Labs  03/31/17 1524 04/01/17 0200 04/01/17 0214 04/01/17 0702  HGB 10.8* 8.9*  --   --   HCT 32.2* 26.4*  --   --   PLT 223 177  --   --   LABPROT 31.8* 31.8*  --   --   INR 3.00 3.00  --   --   CREATININE QUANTITY NOT SUFFICIENT, UNABLE TO PERFORM TEST 1.20*  --  1.33*  TROPONINI  --   --  <0.03  --     Estimated Creatinine Clearance: 28.2 mL/min (A) (by C-G formula based on SCr of 1.33 mg/dL (H)).   Medical History: Past Medical History:  Diagnosis Date  . Atrial fibrillation (HCC)    chronic Coumadin.   Marland Kitchen COPD (chronic obstructive pulmonary disease) ([place])   . Glaucoma   . Hypothyroidism   . Neuropathy    PERIPHERAL  . Osteoarthritis   . Osteoporosis   . Trimalleolar fracture    LEFT TRIMALLEOLAR FRACTURE, DISLOCATION WITH OBLIQUE FRACTURE OF THE DISTAL FIBULAR DIAPHYSIS, AND NOTED BEING MILDLY COMMINUTED     Assessment: 67 y/oF with PMH of atrial fibrillation on warfarin, chronic respiratory failure, combined systolic and diastolic heart failure, HTN, hypothyroidism, chronic pressure ulcers who was seen in ED 03/21/2017 and diagnosed with URI/COPD, UTI-sent home with Doxycycline and Cephalexin. Patient presented to [facility] ED today, 03/31/2017, with increased lethargy, weakness, confusion, decreased PO intake, intermittent lower abdominal/pelvic discomfort, and hematuria. UA c/w UTI. Pharmacy consulted by [facility] to assist with dosing of warfarin while patient in the hospital. INR on admission 3. CBC reveals Hgb 10.8 (stable as compared to previous values), Pltc WNL. Home dose of warfarin reported as 1mg  PO daily, with last dose taken 03/30/2017 at 1800.   Today, 04/01/2017  INR 3.0  Hgb down  8.9, Plts wnl  Hematuria noted to be resolving  Goal of Therapy:  INR 2-3 Monitor platelets by anticoagulation protocol: Yes   Plan:   No warfarin today since INR at upper end of range and NPO   Daily PT/INR.   Monitor CBC and for worsening hematuria/bleeding.   Peggyann Juba, PharmD, BCPS Pager: [phone] 03/31/2017 8:43 PM

## 2017-04-01 NOTE — Significant Event (Signed)
The nurse notified me about patient having right-sided facial droop and mildly dilated right pupils at around 12:50 AM 04/01/2017. This was at the time when patient was about to get Lasix for the shortness of breath. On arrival at the bedside patient appears confused and mentation was waxing and waning oriented to her name and at times completely confused. Blood pressure was 115/60 pulse 60/m temperature was 97.4 respiration 18/m. On exam patient had right facial droop and mildly enlarged pupil on the right side when compared to left. Moves all extremities. I reviewed patient's chart and labs. Assessment - Acute worsening of mental status with right facial droop and enlarged right pupil. Note that patient had presented with confusion with possible UTI as the source. And patient is on ceftriaxone. Plan - discussed with on-call neurologist Dr. Cheral Marker. CT of the head was ordered stat. It was read negative for any bleed. Patient soon became hypotensive for which fluid bolus 1 L was ordered and Neo-Synephrine was started since patient's blood pressure was not improving. I have in addition ordered lactic acid levels troponin procalcitonin levels ABG and will transfer to stepdown. I have also consulted pulmonary critical care. Neurology at this time requested MRI of the brain along with MRA brain and further workup for stroke based on MRI results.  Gean Birchwood

## 2017-04-01 NOTE — Progress Notes (Addendum)
Initial Nutrition Assessment  DOCUMENTATION CODES:   Severe malnutrition in context of chronic illness  INTERVENTION:   RD will order supplements when diet advanced- Recommend SLP evaluation prior to diet initiation.   Per family, pt will NOT eat pureed food  NUTRITION DIAGNOSIS:   Malnutrition (severe) related to  (COPD, CHF, advanced age ) as evidenced by severe depletion of body fat, severe depletion of muscle mass, energy intake < or equal to 75% for > or equal to 1 month.  GOAL:   Patient will meet greater than or equal to 90% of their needs  MONITOR:   Diet advancement, Labs, Weight trends, I & O's  REASON FOR ASSESSMENT:   Malnutrition Screening Tool    ASSESSMENT:   83 y.o. female with medical history significant of atrial fib on coumadin, chronic respiratory failure on 2.5-3L at baseline O2, chronic combined systolic and diastolic heart failure, HTN, hypothyroidism, and chronic pressure ulcers. She was evaluated in the ED a week ago for confusion and lethargy, was diagnosed with URI/COPD exacerbation as well as UTI. She was given steroids, doxycyline and keflex and sent home. Since then, she has not gotten any better. She has continued to be lethargic, not acting her normal self, weak, confused, with decreased PO intake. No reported fevers, but admits to chills, no chest pain, nausea, vomiting. She has chronic diarrhea. Developed Rt sided facial droop and dysarthria for which had STAT ct of the head that was low suspicious for stroke. Patient found to be hypotensive, transferred to the ICU and started on phenylephrine   Met with pt and family members in room today. Pt with AMS so history obtained from pt's family members at bedside. Family members report that pt has had a poor appetite and oral intake for at least 6 months. They report that pt will only take a few bites of her food and they have started offering her whatever they can get her to eat. Pt does like carnation  instant breakfast, yogurt, cottage cheese with peaches, and meats (ground). Pt will not eat pureed food. Pt has a history of an esophageal stricture s/p dilation in 2016 in [facility]. Family reports that pt will often have trouble chewing and swallowing food. They describe a lot of saliva production and pt gagging after eating. Recommend SLP evaluation prior to diet initiation. Per chart, pt is weight stable. Pt currently NPO for MRI today; possible stroke overnight. Pt ate <25% of her dinner last night prior to NPO. RD will order appropriate supplements when diet advanced.   Medications reviewed and include: synthroid, mirtazapine, protonix, warfarin, ceftriaxone, neo-synephrine  Labs reviewed: BUN 21(H), creat 1.33(H), Ca 8.1(L) adj. 9.14 wnl, alb 2.7(L) Wbc- 20.8(H), Hgb 8.9(L), Hct 26.4(L)  Nutrition-Focused physical exam completed. Findings are moderate to severe fat depletions in orbital regions, arms and chest, moderate to severe muscle depletions over entire upper body, and mild edema.   Diet Order:  NPO   Skin:  Wound (see comment) (Stage II sacrum, bilateral heel wound)  Last BM:  8/15  Height:   Ht Readings from Last 1 Encounters:  04/01/17 _0  (1.702 m)    Weight:   Wt Readings from Last 1 Encounters:  04/01/17 134 lb 14.7 oz (61.2 kg)    Ideal Body Weight:  61.4 kg  BMI:  Body mass index is 21.13 kg/m.  Estimated Nutritional Needs:   Kcal:  1700-2000kcal/day   Protein:  91-104g/day   Fluid:  >1.7L/day   EDUCATION NEEDS:  Education needs no appropriate at this time  Koleen Distance MS, RD, [place] Pager #[phone] After Hours Pager: [phone]

## 2017-04-01 NOTE — Progress Notes (Signed)
Dr. Hal Hope verbally ordered 20 mg IV lasix, hold Normal Saline at this time, and STAT CXR. Orders placed and Fluid stopped. This RN will administer lasix as soon as pharmacy verifies medication. This RN will continue to monitor pt closely.

## 2017-04-01 NOTE — Progress Notes (Signed)
Peripherally Inserted Central Catheter/Midline Placement  The IV Nurse has discussed with the patient and/or persons authorized to consent for the patient, the purpose of this procedure and the potential benefits and risks involved with this procedure.  The benefits include less needle sticks, lab draws from the catheter, and the patient may be discharged home with the catheter. Risks include, but not limited to, infection, bleeding, blood clot (thrombus formation), and puncture of an artery; nerve damage and irregular heartbeat and possibility to perform a PICC exchange if needed/ordered by physician.  Alternatives to this procedure were also discussed.  Bard Power PICC patient education guide, fact sheet on infection prevention and patient information card has been provided to patient /or left at bedside.    PICC/Midline Placement Documentation  PICC Triple Lumen 04/01/17 PICC Right Brachial 40 cm 0 cm (Active)  Indication for Insertion or Continuance of Line Vasoactive infusions 04/01/2017  8:40 PM  Exposed Catheter (cm) 0 cm 04/01/2017  8:40 PM  Site Assessment Clean;Dry;Intact 04/01/2017  8:40 PM  Lumen #1 Status Blood return noted;Flushed;Saline locked 04/01/2017  8:40 PM  Lumen #2 Status Blood return noted;Flushed;Saline locked 04/01/2017  8:40 PM  Lumen #3 Status Blood return noted;Flushed;Saline locked 04/01/2017  8:40 PM  Dressing Type Transparent 04/01/2017  8:40 PM  Dressing Status Clean;Dry;Intact;Antimicrobial disc in place 04/01/2017  8:40 PM  Dressing Intervention New dressing 04/01/2017  8:40 PM  Dressing Change Due 04/08/17 04/01/2017  8:40 PM       [name] L 04/01/2017, 8:50 PM

## 2017-04-01 NOTE — Progress Notes (Signed)
Late entry - pt alert, oriented to person ,place, knows family, still some confusion, moving all extremities . Back to baseline per family except for confusion,  patient aware that she is confused.  Dr Elsworth Soho in,  MRI canceled .    B/P now decreased systolic from 154 to 70 -90.. Dr Elsworth Soho at bedside plan to monitor for now and increase NS to 50 cc/hr.  Granddaughter just told me that she has had these episodes of B/P dropping and decreased LOC.  They have called EMS before and with monitoring B/P will improve.  She has also been evaluated in the ER and sent home.  Doctors have not been able to tell family why this happens.

## 2017-04-01 NOTE — Progress Notes (Signed)
Paged Dr. Hal Hope regarding pt more difficult to arouse.  Pupils again unequal and HR in mid 50's.  Francia Greaves Lennix Rotundo,RN,BSN,CCRN

## 2017-04-01 NOTE — Progress Notes (Signed)
Offsite EEG completed at [facility]; results pending. 

## 2017-04-01 NOTE — Care Management Note (Signed)
Case Management Note  Patient Details  Name: [name] MRN: [id number] Date of Birth: Jun 01, 1927  Subjective/Objective:    Confusion, lethargy, poss. Rt cva                Action/Plan: Date:  April 01, 2017 Chart reviewed for concurrent status and case management needs. Will continue to follow patient progress. Discharge Planning: following for needs Expected discharge date: [id number] Velva Harman, BSN, [place], [place]  Expected Discharge Date:   (unknown)               Expected Discharge Plan:  Home/Self Care  In-House Referral:     Discharge planning Services  CM Consult  Post Acute Care Choice:    Choice offered to:     DME Arranged:    DME Agency:     HH Arranged:    [place] Agency:     Status of Service:  In process, will continue to follow  If discussed at Long Length of Stay Meetings, dates discussed:    Additional Comments:  Leeroy Cha, RN 04/01/2017, 8:28 AM

## 2017-04-01 NOTE — Procedures (Signed)
ELECTROENCEPHALOGRAM REPORT  Date of Study: 04/01/2017  Patient's Name: [name] MRN: [id number] Date of Birth: 04/14/1927  Referring Provider: Dr. Gean Birchwood  Clinical History: This is an 83 year old woman with right facial droop and dysarthria  Medications: acetaminophen (TYLENOL) tablet 650 mg  albuterol (PROVENTIL) (2.5 MG/3ML) 0.083% nebulizer solution 2.5 mg  bisacodyl (DULCOLAX) EC tablet 5 mg  cefTRIAXone (ROCEPHIN) 1 g in dextrose 5 % 50 mL IVPB  dorzolamide (TRUSOPT) 2 % ophthalmic solution 1 drop  feeding supplement (ENSURE ENLIVE) (ENSURE ENLIVE) liquid 237 mL  FLUoxetine (PROZAC) capsule 40 mg  furosemide (LASIX) injection 40 mg  latanoprost (XALATAN) 0.005 % ophthalmic solution 1 drop  levothyroxine (SYNTHROID, LEVOTHROID) injection 37.5 mcg  loperamide (IMODIUM) capsule 2 mg  mirtazapine (REMERON) tablet 7.5 mg  morphine 2 MG/ML injection 1 mg  pantoprazole (PROTONIX) injection 40 mg  phenylephrine (NEO-SYNEPHRINE) 10 mg in sodium chloride 0.9 % 250 mL (0.04 mg/mL) infusion  pilocarpine (PILOCAR) 4 % ophthalmic solution 1 drop  rOPINIRole (REQUIP) tablet 1 mg  senna-docusate (Senokot-S) tablet 1 tablet  timolol (TIMOPTIC) 0.5 % ophthalmic solution 1 drop   Technical Summary: A multichannel digital EEG recording measured by the international 10-20 system with electrodes applied with paste and impedances below 5000 ohms performed as portable with EKG monitoring in an awake and drowsy patient.  Hyperventilation and photic stimulation were not performed.  The digital EEG was referentially recorded, reformatted, and digitally filtered in a variety of bipolar and referential montages for optimal display.   Description: The patient is awake and drowsy during the recording. She is poorly responsive during the study. There is no clear posterior dominant rhythm seen. The background consists of a large amount of diffuse low to medium voltage 4-5 Hz theta and 2-3  Hz delta slowing admixed with diffuse beta activity. Normal sleep architecture was not seen. Hyperventilation and photic stimulation were not performed.  There were no epileptiform discharges or electrographic seizures seen.    EKG lead showed irregular rhythm. Impression: This awake and drowsy EEG is abnormal due to moderate diffuse slowing of the background.  Clinical Correlation of the above findings indicates diffuse cerebral dysfunction that is non-specific in etiology and can be seen with hypoxic/ischemic injury, toxic/metabolic encephalopathies, neurodegenerative disorders, or medication effect.  The absence of epileptiform discharges does not rule out a clinical diagnosis of epilepsy.  Clinical correlation is advised.   Ellouise Newer, M.D.

## 2017-04-01 NOTE — Progress Notes (Signed)
PHARMACY - PHYSICIAN COMMUNICATION CRITICAL VALUE ALERT - BLOOD CULTURE IDENTIFICATION (BCID)  Results for orders placed or performed during the hospital encounter of 03/31/17  Blood Culture ID Panel (Reflexed) (Collected: 03/31/2017  6:26 PM)  Result Value Ref Range   Enterococcus species NOT DETECTED NOT DETECTED   Vancomycin resistance NOT DETECTED NOT DETECTED   Listeria monocytogenes NOT DETECTED NOT DETECTED   Staphylococcus species NOT DETECTED NOT DETECTED   Staphylococcus aureus NOT DETECTED NOT DETECTED   Methicillin resistance NOT DETECTED NOT DETECTED   Streptococcus species NOT DETECTED NOT DETECTED   Streptococcus agalactiae NOT DETECTED NOT DETECTED   Streptococcus pneumoniae NOT DETECTED NOT DETECTED   Streptococcus pyogenes NOT DETECTED NOT DETECTED   Acinetobacter baumannii NOT DETECTED NOT DETECTED   Enterobacteriaceae species DETECTED (A) NOT DETECTED   Enterobacter cloacae complex NOT DETECTED NOT DETECTED   Escherichia coli NOT DETECTED NOT DETECTED   Klebsiella oxytoca NOT DETECTED NOT DETECTED   Klebsiella pneumoniae NOT DETECTED NOT DETECTED   Proteus species DETECTED (A) NOT DETECTED   Serratia marcescens NOT DETECTED NOT DETECTED   Carbapenem resistance NOT DETECTED NOT DETECTED   Haemophilus influenzae NOT DETECTED NOT DETECTED   Neisseria meningitidis NOT DETECTED NOT DETECTED   Pseudomonas aeruginosa NOT DETECTED NOT DETECTED   Candida albicans NOT DETECTED NOT DETECTED   Candida glabrata NOT DETECTED NOT DETECTED   Candida krusei NOT DETECTED NOT DETECTED   Candida parapsilosis NOT DETECTED NOT DETECTED   Candida tropicalis NOT DETECTED NOT DETECTED    Name of physician (or Provider) Contacted: Dr. Maylene Roes  Changes to prescribed antibiotics required: increase ceftriaxone to 2g IV q24h  Peggyann Juba, PharmD, BCPS Pager: [phone] 04/01/2017  2:19 PM

## 2017-04-01 NOTE — Progress Notes (Signed)
AM PCCM follow up > pt seen at 0800 this am by overnight MD.  Chart reviewed, discussed status with RN.  Patient weaned off vasopressors 0740.  Note pt received antihypertensives last pm.  Mental status improving / speech clear.  EEG completed but results pending.  MRI pending.     Septic Shock - in setting of suspected urinary source, BP improved / off pressors  Altered Mental Status - rule out CVA, CT head negative, suspect secondary to infection in setting of frail elderly  Lactic Acidosis - resolved   Plan: Follow up results of EEG, MRI once available ICU monitoring of hemodynamics  Hold home antihypertensive, diuretics  Minimize sedating medications as able  Follow up labs, pcxr in am  Neurology consulted (per prior notes)  PCCM will follow up 8/17 am.   Adrienne Gens, NP-C [facility] Pgr: [phone] or if no answer [phone] 04/01/2017, 10:31 AM

## 2017-04-01 NOTE — Consult Note (Signed)
[place] Nurse wound consult note Reason for Consult: Pressure injuries to sacrum, bilateral heels.  Chronic, non-healing wound to right lateral malleolus with exposed hardware. Patient with urinary incontinence. Wound type:Pressure, surgical Pressure Injury POA: Yes Measurement:  Right alteral malleolus:  3cm x 1cm x 0.4cm with exposed hardware in center. Mild serous exudate with patient having LE elevated overnight. Patient with appointment to see outpatient [facility] hospital next week. Sacral: 6cm x 9cm area of erythema and partial thickness skin loss with 3cm x 4cm area of purple discoloration with skin loss in center, depth obscured by the presence of necrotic tissue. Unstageable pressure injury. Scant serous exudate Right heel:  3cm x 2cm area of full thickness skin loss with yellow slough obscuring wound bed. Scant amount of light yellow exudate  Consistent with autolytically debriding necrotic tissue. Left heel: 3cm x 2.5cm intact and resolving partial thickness serum filled blister. No drainage. Wound bed: As described above Drainage (amount, consistency, odor) As described above Periwound: intact, dry Dressing procedure/placement/frequency: Patient is on a therapeutic mattress with low air loss feature and one will be provided when she transfers to floor.  She has the PurWick in place for urinary incontinence.  She is bring turned and repositioned side to side, but I have added guidance to refrain from supine positioning. New Prevalon boots are provided to the patient for pressure redistribution in addition to correction of the lateral rotation. Her granddaughter is taught the reason for the new boots as replacement for the ones she had at home and she is in agreement with plan. Wound care orders to use silver alginate over the non-healing chronic wound on the right lateral ankle are provided as are wound care orders using collagenase (Santyl) to the right heel and sacral pressure injuries. A  nearly resolved serum-filled blister on the left heel will continue to heal with daily moisturizing and pressure redistribution. [place] nursing team will not follow, but will remain available to this patient, the nursing and medical teams.  Please re-consult if needed. Thanks, Maudie Flakes, MSN, RN, [place], Arther Abbott  Pager# [phone]

## 2017-04-01 NOTE — Progress Notes (Signed)
While in the pt's room administering lasix per MD order. This RN witnessed pt leaning to the left with pt's head leaned to the left. Drool coming out of her mouth and her mouth drooping to the left. Pt was harder to arouse than on earlier assessment and would drift off during conversation. Rapid Response called, stroke alert called, Dr. Hal Hope notified and made aware of situation. Rapid response and Dr. Hal Hope at bedside and patient brought down to CT STAT. Pt's blood pressure in CT began to drop to 60s/40s. 1L NS bolus given to pt. Pt then transported to ICU where vasopressors were started.

## 2017-04-02 ENCOUNTER — Inpatient Hospital Stay (HOSPITAL_COMMUNITY): Payer: Medicare Other

## 2017-04-02 DIAGNOSIS — R319 Hematuria, unspecified: Secondary | ICD-10-CM

## 2017-04-02 DIAGNOSIS — Z7189 Other specified counseling: Secondary | ICD-10-CM

## 2017-04-02 DIAGNOSIS — R064 Hyperventilation: Secondary | ICD-10-CM

## 2017-04-02 LAB — CBC
HEMATOCRIT: 26.5 % — AB (ref 36.0–46.0)
HEMOGLOBIN: 8.9 g/dL — AB (ref 12.0–15.0)
MCH: 31.1 pg (ref 26.0–34.0)
MCHC: 33.6 g/dL (ref 30.0–36.0)
MCV: 92.7 fL (ref 78.0–100.0)
Platelets: 180 10*3/uL (ref 150–400)
RBC: 2.86 MIL/uL — AB (ref 3.87–5.11)
RDW: 17.2 % — AB (ref 11.5–15.5)
WBC: 22.5 10*3/uL — AB (ref 4.0–10.5)

## 2017-04-02 LAB — PROTIME-INR
INR: 4.94
Prothrombin Time: 47.4 seconds — ABNORMAL HIGH (ref 11.4–15.2)

## 2017-04-02 LAB — BASIC METABOLIC PANEL
Anion gap: 10 (ref 5–15)
BUN: 23 mg/dL — ABNORMAL HIGH (ref 6–20)
CHLORIDE: 108 mmol/L (ref 101–111)
CO2: 21 mmol/L — AB (ref 22–32)
CREATININE: 1.45 mg/dL — AB (ref 0.44–1.00)
Calcium: 8 mg/dL — ABNORMAL LOW (ref 8.9–10.3)
GFR calc non Af Amer: 31 mL/min — ABNORMAL LOW (ref >60)
GFR, EST AFRICAN AMERICAN: 36 mL/min — AB (ref >60)
Glucose, Bld: 78 mg/dL (ref 65–99)
POTASSIUM: 3.6 mmol/L (ref 3.5–5.1)
Sodium: 139 mmol/L (ref 135–145)

## 2017-04-02 LAB — MAGNESIUM: Magnesium: 1.6 mg/dL — ABNORMAL LOW (ref 1.7–2.4)

## 2017-04-02 LAB — CORTISOL: Cortisol, Plasma: 39.2 ug/dL

## 2017-04-02 MED ORDER — ADULT MULTIVITAMIN LIQUID CH
15.0000 mL | Freq: Every day | ORAL | Status: DC
  Administered 2017-04-02 – 2017-04-09 (×7): 15 mL via ORAL
  Filled 2017-04-02 (×8): qty 15

## 2017-04-02 MED ORDER — MAGNESIUM SULFATE 2 GM/50ML IV SOLN: 2.0000 g | Freq: Once | INTRAVENOUS | Status: DC

## 2017-04-02 MED ORDER — MAGNESIUM SULFATE 2 GM/50ML IV SOLN
2.0000 g | Freq: Once | INTRAVENOUS | Status: AC
  Administered 2017-04-02: 2 g via INTRAVENOUS
  Filled 2017-04-02: qty 50

## 2017-04-02 MED ORDER — LIP MEDEX EX OINT
TOPICAL_OINTMENT | CUTANEOUS | Status: AC
  Filled 2017-04-02: qty 7

## 2017-04-02 MED ORDER — LEVOTHYROXINE SODIUM 50 MCG PO TABS
75.0000 ug | ORAL_TABLET | Freq: Every day | ORAL | Status: DC
  Administered 2017-04-03 – 2017-04-09 (×7): 75 ug via ORAL
  Filled 2017-04-02 (×6): qty 1

## 2017-04-02 NOTE — Consult Note (Signed)
Consultation Note Date: 04/02/2017   Patient Name: [name]  DOB: 12-25-1926  MRN: [id number]  Age / Sex: 83 y.o., female  PCP: Tamsen Roers, MD Referring Physician: Baird Lyons*  Reason for Consultation: Establishing goals of care and Psychosocial/spiritual support  HPI/Patient Profile: 83 y.o. female  with past medical history of A. Fib on coumadin, COPD on 2-3L baseline O2, combined systolic and diastolic heart failure, HTN, Hypothyroidism, glaucoma, and chronic pressure ulcers. She now presents with ongoing lethargy and confusion. She had presented with similar symptoms one week prior and was given steroids and antibiotics for URI and UTI. She returned to the ED after she had not improved and was subsequently admitted on 03/31/2017. She is now being treated for septic shock secondary to UTI. Blood cultures positive for Enterobacteriaceae/proteus. Urine culture positive for GNR. AKI also present. Palliative consulted to assist in clarifying goals of care  Clinical Assessment and Goals of Care: [name] is now more awake, alert, and closer to her baseline mental status (per her daughter). She was socially interactive but did have some confusion on the situation and her health history. Given her confusion, I spoke with her daughter--[name]-- over the phone. She and her brother are the patient's next-of-kin decision maker.   [name] and I talked through her mother's medical history. [name] was very realistic and had an excellent grasp on her chronic medical issues, as well as her decline over the past year. She understood her mother is very frail and has a high likelihood for acute decline. I gave her an update on the acute issues being addressed this hospitalization and was able to clarify a number of questions she had.   As we then started to talk about the overall goals of care, [name] was very forthright about the many conversations she has had  with her mother over the years about her care wishes. The patient's husband died from cancer, and [name] has since been very proactive in talking with her mother about her advanced directive wishes, including code status. [name] relates that her mother has consistently stated her desire for all aggressive measures to be taken to prolong her life. They have discussed situations when recovery was unlikely and that Full code interventions would cause pain and suffering, and [name] remained clear on her desires. Finally, they have also discussed issues such as inadequate oral intake or swallowing deficits, in these situations [name] wanted a feeding tube.   Importantly, [name] her brother, per [name]'s report] are very clear on their mother's wishes. They feel that quality of life for her is centered around being able to think clearly and meaningfully interact. If a situation occurred where their mother deteriorated to the point that her cognitive function was markedly diminished with low likelihood of improvement, they are open to considering changing her code status. At this point, however, they are seeing her improvement and plan to continue to honor her wishes for full aggressive care and full code status.  Primary Decision Maker NEXT OF KIN   SUMMARY OF RECOMMENDATIONS    Full code, full aggressive care  If pt should deteriorate, family open to discussing code status  **Very clear goals of care at this point. [name] has our team number and I encouraged her to call with any questions, concerns, or if she feels her mother is deteriorating. Palliative will continue to peripherally follow, but please call us with any changes for which we should re-engage. Team phone is [phone].  Code Status/Advance Care Planning:  Full code  Symptom Management:   [name] reports she is feeling much better and denies any acute concerns or complaints.   Palliative Prophylaxis:   Frequent Pain  Assessment, Oral Care, Palliative Wound Care and Turn Reposition  Additional Recommendations (Limitations, Scope, Preferences):  Full Scope Treatment  Psycho-social/Spiritual:   Desire for further Chaplaincy support:no  Additional Recommendations: TBD  Prognosis:   Unable to determine  Discharge Planning: Home with Home Health      Primary Diagnoses: Present on Admission: . UTI (urinary tract infection)   I have reviewed the medical record, interviewed the patient and family, and examined the patient. The following aspects are pertinent.  Past Medical History:  Diagnosis Date  . Atrial fibrillation (HCC)    chronic Coumadin.   Marland Kitchen COPD (chronic obstructive pulmonary disease) ([place])   . Glaucoma   . Hypothyroidism   . Neuropathy    PERIPHERAL  . Osteoarthritis   . Osteoporosis   . Trimalleolar fracture    LEFT TRIMALLEOLAR FRACTURE, DISLOCATION WITH OBLIQUE FRACTURE OF THE DISTAL FIBULAR DIAPHYSIS, AND NOTED BEING MILDLY COMMINUTED   Social History   Social History  . Marital status: Widowed    Spouse name: N/A  . Number of children: N/A  . Years of education: N/A   Social History Main Topics  . Smoking status: Never Smoker  . Smokeless tobacco: Never Used  . Alcohol use No  . Drug use: No  . Sexual activity: Not Asked   Other Topics Concern  . None   Social History Narrative  . None   Family History  Problem Relation Age of Onset  . CAD Mother   . Heart failure Father   . Heart failure Brother   . CAD Brother   . Colon cancer Neg Hx   . Stomach cancer Neg Hx    Scheduled Meds: . Chlorhexidine Gluconate Cloth  6 each Topical Daily  . collagenase   Topical Daily  . dorzolamide  1 drop Left Eye BID  . feeding supplement (ENSURE ENLIVE)  237 mL Oral BID BM  . FLUoxetine  40 mg Oral Daily  . latanoprost  1 drop Left Eye QHS  . [START ON 04/03/2017] levothyroxine  75 mcg Oral QAC breakfast  . lip balm      . mirtazapine  7.5 mg Oral QHS  .  multivitamin  15 mL Oral Daily  . pantoprazole (PROTONIX) IV  40 mg Intravenous Q24H  . pilocarpine  1 drop Left Eye BID  . rOPINIRole  1 mg Oral QHS  . timolol  1 drop Left Eye BID  . Warfarin - Pharmacist Dosing Inpatient   Does not apply q1800   Continuous Infusions: . cefTRIAXone (ROCEPHIN)  IV 2 g (04/02/17 1324)   PRN Meds:.acetaminophen **OR** acetaminophen, albuterol, bisacodyl, loperamide, ondansetron **OR** ondansetron (ZOFRAN) IV, senna-docusate, sodium chloride flush No Known Allergies   Review of Systems  Constitutional: Positive for activity change and fatigue.  HENT: Positive for trouble swallowing ("always"). Negative for sore throat.   Eyes: Positive for visual disturbance (glaucoma).  Respiratory: Positive for shortness of breath and wheezing.   Cardiovascular: Positive for leg swelling. Negative for chest pain.  Gastrointestinal: Negative for abdominal distention, abdominal pain, nausea and vomiting.  Musculoskeletal: Positive for gait problem.  Skin: Positive for color change ("it's getting much better") and pallor.  Neurological: Positive for weakness. Negative for speech difficulty.  Hematological: Bruises/bleeds easily.  Psychiatric/Behavioral: Positive for confusion. Negative for sleep  disturbance. The patient is not nervous/anxious.    Physical Exam  HENT:  Head: Normocephalic and atraumatic.  Mouth/Throat: Oropharynx is clear and moist. Abnormal dentition. No oropharyngeal exudate.  Eyes: EOM are normal.  Left pupil pinpoint, right pupil dilated  Neck: Normal range of motion. Neck supple.  Cardiovascular: An irregularly irregular rhythm present. Bradycardia present.   Pulmonary/Chest: Tachypnea noted. She has decreased breath sounds in the right lower field and the left lower field. She has wheezes.  Abdominal: Soft. Bowel sounds are normal.  Musculoskeletal: She exhibits edema.  Generalized weakness.  Neurological: She is alert.  Oriented to person  and place. Confused on time and situation. Follows simple commands.   Skin: Skin is warm and dry.  Extensive areas of erythema on BUE. Bruising and resolving scabs noted. Skin paper thin.  Psychiatric: She has a normal mood and affect. Judgment and thought content normal. Her speech is delayed. She is slowed. Cognition and memory are impaired.   Vital Signs: BP (!) 145/74   Pulse (!) 52   Temp 98.4 F (36.9 C) (Oral)   Resp (!) 26   Ht 5\' 7"  (1.702 m)   Wt 69.4 kg (153 lb)   SpO2 97%   BMI 23.96 kg/m  Pain Assessment: No/denies pain   Pain Score: 0-No pain   SpO2: SpO2: 97 % O2 Device:SpO2: 97 % O2 Flow Rate: .O2 Flow Rate (L/min): 3 L/min  IO: Intake/output summary:  Intake/Output Summary (Last 24 hours) at 04/02/17 1340 Last data filed at 04/02/17 1200  Gross per 24 hour  Intake             1817 ml  Output              700 ml  Net             1117 ml    LBM: Last BM Date: 03/31/17 Baseline Weight: Weight: 59.7 kg (131 lb 9.8 oz) Most recent weight: Weight: 69.4 kg (153 lb)     Palliative Assessment/Data: PPS 30-40%    Time Total: 70 minutes Greater than 50%  of this time was spent counseling and coordinating care related to the above assessment and plan.  Signed by: Charlynn Court, NP Palliative Medicine Team Pager # [phone] (M-F 7a-5p) Team Phone # [phone] (Nights/Weekends)

## 2017-04-02 NOTE — Progress Notes (Signed)
ANTICOAGULATION CONSULT NOTE - Follow Up Consult  Pharmacy Consult for Warfarin Indication: atrial fibrillation  No Known Allergies   Vital Signs: Temp: 97.6 F (36.4 C) (08/17 0347) Temp Source: Axillary (08/17 0347) BP: 137/66 (08/17 0630) Pulse Rate: 107 (08/17 0630)  Labs:  Recent Labs  03/31/17 1524 04/01/17 0200 04/01/17 0214 04/01/17 0702 04/02/17 0300  HGB 10.8* 8.9*  --   --  8.9*  HCT 32.2* 26.4*  --   --  26.5*  PLT 223 177  --   --  180  LABPROT 31.8* 31.8*  --   --  47.4*  INR 3.00 3.00  --   --  4.94*  CREATININE QUANTITY NOT SUFFICIENT, UNABLE TO PERFORM TEST 1.20*  --  1.33* 1.45*  TROPONINI  --   --  <0.03  --   --     Estimated Creatinine Clearance: 26.1 mL/min (A) (by C-G formula based on SCr of 1.45 mg/dL (H)).   Medical History: Past Medical History:  Diagnosis Date  . Atrial fibrillation (HCC)    chronic Coumadin.   Marland Kitchen COPD (chronic obstructive pulmonary disease) ([place])   . Glaucoma   . Hypothyroidism   . Neuropathy    PERIPHERAL  . Osteoarthritis   . Osteoporosis   . Trimalleolar fracture    LEFT TRIMALLEOLAR FRACTURE, DISLOCATION WITH OBLIQUE FRACTURE OF THE DISTAL FIBULAR DIAPHYSIS, AND NOTED BEING MILDLY COMMINUTED     Assessment: 65 y/oF with PMH of atrial fibrillation on warfarin, chronic respiratory failure, combined systolic and diastolic heart failure, HTN, hypothyroidism, chronic pressure ulcers who was seen in ED 03/21/2017 and diagnosed with URI/COPD, UTI-sent home with Doxycycline and Cephalexin. Patient presented to [facility] ED today, 03/31/2017, with increased lethargy, weakness, confusion, decreased PO intake, intermittent lower abdominal/pelvic discomfort, and hematuria. UA c/w UTI. Pharmacy consulted by [facility] to assist with dosing of warfarin while patient in the hospital. INR on admission 3. CBC reveals Hgb 10.8 (stable as compared to previous values), Pltc WNL. Home dose of warfarin reported as 1mg  PO daily, with last dose taken  03/30/2017 at 1800.   Today, 04/02/2017  INR continues to rise- 4.94 today  Hgb low but stable at 8.9, Plts wnl  Hematuria resolved  Dysphagia diet ordered  Goal of Therapy:  INR 2-3   Plan:   Hold warfarin today  Daily PT/INR.   Monitor CBC and for worsening hematuria/bleeding.  May consider low dose Vit K if bleeding noted/ INR continues to trend up.  Netta Cedars, PharmD, BCPS Pager: [phone] 03/31/2017 8:43 PM

## 2017-04-02 NOTE — Evaluation (Signed)
Clinical/Bedside Swallow Evaluation Patient Details  Name: [name] MRN: [id number] Date of Birth: Dec 24, 1926  Today's Date: 04/02/2017 Time: SLP Start Time (ACUTE ONLY): 1013 SLP Stop Time (ACUTE ONLY): 1048 SLP Time Calculation (min) (ACUTE ONLY): 35 min  Past Medical History:  Past Medical History:  Diagnosis Date  . Atrial fibrillation (HCC)    chronic Coumadin.   Marland Kitchen COPD (chronic obstructive pulmonary disease) ([place])   . Glaucoma   . Hypothyroidism   . Neuropathy    PERIPHERAL  . Osteoarthritis   . Osteoporosis   . Trimalleolar fracture    LEFT TRIMALLEOLAR FRACTURE, DISLOCATION WITH OBLIQUE FRACTURE OF THE DISTAL FIBULAR DIAPHYSIS, AND NOTED BEING MILDLY COMMINUTED   Past Surgical History:  Past Surgical History:  Procedure Laterality Date  . COLONOSCOPY W/ POLYPECTOMY  11/2010   Dr Fuller Plan.  multiple polyps:cecal, transverse, sigmoid.  largest 68mm, path: tubular adenomas without HGD.  mild sigmoid diverticulosis.   Marland Kitchen ORIF TIBIA & FIBULA FRACTURES  2016  . VAGINAL HYSTERECTOMY     HPI:  83 y.o.femalewith medical history significant for atrial fib on coumadin, chronic respiratory failure, chronic combined systolic and diastolic heart failure, HTN, hypothyroidism, and chronic pressure ulcers. She was evaluated in the ED a week ago for confusion and lethargy, was diagnosed with URI/COPD exacerbation as well as UTI.  Since then, she has not gotten any better. She has continued to be lethargic, not acting her normal self, weak, confused, with decreased PO intake. At baseline, she does not ambulate. She is able to assist in transferring to motorized wheelchair. Dx acute metabolic encephalopathy, UTI, septic shock. Pt underwent MBS during Feb 2018 admission, which revealed grossly functional swallow with age-related changes.  Notable was severely reduced motility of the esophagus, as well as reflux and backflow of barium into the pharynx.  F/u esophagram 11/04/16 with findings of  high-grade distal esophageal stricture.    Assessment / Plan / Recommendation Clinical Impression  Pt presents with a chronic primary esophageal dysphagia secondary to distal stricture.  She was not a candidate for dilatation last March, per her daughter.  Her daughter describes laborious diet modifications and feeding strategies to facilitate improved eating, but pt generally eats limited, soft POs and drinks marginal amounts of fluid despite her daughter's efforts.  Today, her clinical signs remain consistent with esophageal deficits - there is early satiety, c/o globus, delayed cough and some regurgitation.  Discussed options for POs - pt refuses purees; her daughter believes dysphagia 2 is best diet.  Recommend dysphagia 2, thin liquids, meds crushed in puree.  Esophageal strategies as outlined below.  Pt has inherent risk for aspiration due to esophageal backflow, as well as COPD and generalized weakness.  SLP will follow for education, toleration.  SLP Visit Diagnosis: Dysphagia, pharyngoesophageal phase (R13.14)    Aspiration Risk  Moderate aspiration risk    Diet Recommendation   dysphagia 2, thin liquids  Medication Administration: Crushed with puree    Other  Recommendations Oral Care Recommendations: Oral care BID   Follow up Recommendations        Frequency and Duration min 2x/week  1 week       Prognosis Prognosis for Safe Diet Advancement: Guarded      Swallow Study   General HPI: 83 y.o.femalewith medical history significant for atrial fib on coumadin, chronic respiratory failure, chronic combined systolic and diastolic heart failure, HTN, hypothyroidism, and chronic pressure ulcers. She was evaluated in the ED a week ago for confusion and lethargy,  was diagnosed with URI/COPD exacerbation as well as UTI.  Since then, she has not gotten any better. She has continued to be lethargic, not acting her normal self, weak, confused, with decreased PO intake. At baseline, she  does not ambulate. She is able to assist in transferring to motorized wheelchair. Dx acute metabolic encephalopathy, UTI, septic shock. Pt underwent MBS during Feb 2018 admission, which revealed grossly functional swallow with age-related changes.  Notable was severely reduced motility of the esophagus, as well as reflux and backflow of barium into the pharynx.  F/u esophagram 11/04/16 with findings of high-grade distal esophageal stricture.  Type of Study: Bedside Swallow Evaluation Previous Swallow Assessment: see HPI Diet Prior to this Study: NPO Temperature Spikes Noted: Yes Respiratory Status: Nasal cannula History of Recent Intubation: No Behavior/Cognition: Alert;Cooperative Oral Cavity Assessment: Within Functional Limits Oral Care Completed by SLP: No Vision: Functional for self-feeding Self-Feeding Abilities: Able to feed self Patient Positioning: Upright in bed Baseline Vocal Quality: Hoarse Volitional Cough: Strong Volitional Swallow: Able to elicit    Oral/Motor/Sensory Function Overall Oral Motor/Sensory Function: Within functional limits   Ice Chips Ice chips: Within functional limits   Thin Liquid Thin Liquid: Impaired Presentation: Straw Pharyngeal  Phase Impairments: Cough - Delayed    Nectar Thick Nectar Thick Liquid: Not tested   Honey Thick Honey Thick Liquid: Not tested   Puree Puree: Impaired Presentation: Spoon;Self Fed Pharyngeal Phase Impairments: Other (comments) (regurgitation)   Solid   GO   Solid: Not tested        [name] 04/02/2017,10:51 AM  Adrienne Bamberg L. [name], [place] CCC/SLP Pager [phone]

## 2017-04-02 NOTE — Progress Notes (Addendum)
PROGRESS NOTE    [name]  IRJ:[id number] DOB: [birth year] DOA: 03/31/2017 PCP: Tamsen Roers, MD     Brief Narrative:  [name] is a 83 y.o. female with medical history significant of atrial fib on coumadin, chronic respiratory failure on 2.5-3L at baseline O2, chronic combined systolic and diastolic heart failure, HTN, hypothyroidism, and chronic pressure ulcers. She was evaluated in the ED a week ago for confusion and lethargy, was diagnosed with URI/COPD exacerbation as well as UTI. She was given steroids, doxycyline and keflex and sent home. Since then, she has not gotten any better. She has continued to be lethargic, not acting her normal self, weak, confused, with decreased PO intake. At baseline, she does not ambulate. She is able to assist in transferring to motorized wheelchair.   Overnight on 8/15, she became more short of breath with labored breathing. Chest x-ray was completed which did show pulmonary edema. She was given 20 mg of IV Lasix. During nursing evaluation, patient was seen leaning to the left, left facial droop, more difficult to arouse, unequal pupils. Rapid response was called. Patient underwent CT head stat which was unremarkable for acute etiology. During CT, patient's blood pressure dropped to the 60s. She was given 1 L fluid bolus and transferred to ICU, PCCM was consulted, patient started on vasopressor. Neurology was consulted over the phone, they recommended MRI, MRA brain.   On morning of 8/16, symptoms have resolved. Further questioning of family found that patient has these episodes chronically at home of difficult arousal and has unequal pupils at baseline due to glaucoma, although typically left side is larger than right side. She became verbal with improvement in BP. PCCM canceled MRI/MRA as they did not feel stroke work up was necessary. Blood cultures returned with proteus and rocephin was increased to 2g IV.   Assessment & Plan:   Principal  Problem:   Acute metabolic encephalopathy Active Problems:   UTI (urinary tract infection)   Pressure injury of skin   Protein-calorie malnutrition, severe  Acute toxic encephalopathy -Treat infection as below -Resolved, verbal and oriented this morning   Septic shock secondary to UTI, POA, and proteus bacteremia  -Failure of outpatient treatment with Keflex -Urine culture pending -Blood culture with 2 of 2 proteus, sensitivity pending -Continue Rocephin, dose increased to 2g  -Off vasopressor this morning. Cortisol 39.2   Acute neurologic change 8/15-8/16 -Left facial droop, unequal pupils (per daughter, patient's pupils are unequal at baseline with L>R due to glaucoma. However on my examination, pupils are R>L which is the opposite of her baseline pupil difference)  -CT head without acute etiology -Overnight team 8/15 spoke with neurology on call who recommended MRI, MRA to r/o stroke, but has since been canceled by [facility] team as mentation and neurologic change improved and back to baseline. Per family, patient does not tolerate MRI well so we can hold off.  -EEG completed which showed diffuse cerebral dysfunction that is non-specific in etiology and can be seen with hypoxic/ischemic injury, toxic/metabolic encephalopathies, neurodegenerative disorders, or medication effect -Resolved and back to baseline   Acute on chronic systolic and diastolic heart failure -EF 30-35% -Follows with Dr. Einar Gip -Spoke with Dr. Einar Gip over the phone 8/16. He recommends lasix 40mg  IV BID and continue to monitor. However due to hypotension, lasix could not be given.  -Repeat CXR with bilateral pleural effusion R>L. Hold off on lasix until BP stabilizes.   AKI -Baseline Cr 1.1. Due to combination of hypotension, infection, lasix  -  Continue to treat infection as above -Monitor UOP  -Check renal US   Chronic atrial fibrillation -Coumadin per pharmacy. INR supratherapeutic today    Chronic  respiratory failure -2.5-3L [place] O2 at baseline   Chronic pressure ulcers on her coccyx, bilateral heels, right lateral malleoli -Does not appear acutely infected -Turn every 2 hours -Wound RN consult  Hypertension -Hold antihypertensives in setting of shock. Monitor BP.   Hypothyroidism -Continue Synthroid  Hematuria -In setting of UTI and coumadin use. Resolved.   Goals of care -Full code in this 83 yo female with multiple medical problems, septic shock, respiratory failure, and nonambulatory at baseline with multiple pressure wounds. Discussed with daughter about goals of care. Patient had been DNR at one point in the past but currently remains full code. Recommend palliative care consult.    DVT prophylaxis: coumadin Code Status: full, confirmed with daughter Family Communication: spoke with daughter over phone this afternoon  Disposition Plan: pending improvement, stabilization   Consultants:   PCCM  Neurology over the phone 8/15   Cardiology over the phone 8/16  Palliative care   Procedures:   None  Antimicrobials:  Anti-infectives    Start     Dose/Rate Route Frequency Ordered Stop   04/01/17 1800  cefTRIAXone (ROCEPHIN) 1 g in dextrose 5 % 50 mL IVPB  Status:  Discontinued     1 g 100 mL/hr over 30 Minutes Intravenous Every 24 hours 03/31/17 1950 04/01/17 1353   04/01/17 1400  cefTRIAXone (ROCEPHIN) 2 g in dextrose 5 % 50 mL IVPB     2 g 100 mL/hr over 30 Minutes Intravenous Every 24 hours 04/01/17 1353     03/31/17 1630  cefTRIAXone (ROCEPHIN) 1 g in dextrose 5 % 50 mL IVPB     1 g 100 mL/hr over 30 Minutes Intravenous  Once 03/31/17 1617 03/31/17 2038       Subjective: Patient alert and awake this morning, oriented to hospital and season but not to year. She complains of dry mouth. She has no other complaints, denies chest pain or abdominal pain, no nausea, vomiting. States breathing seems fine.    Objective: Vitals:   04/02/17 0700 04/02/17  0800 04/02/17 0850 04/02/17 1000  BP: (!) 121/48 (!) 144/70  (!) 145/74  Pulse: 64 (!) 49  (!) 52  Resp: (!) 22 (!) 30  (!) 26  Temp:  98.4 F (36.9 C)    TempSrc:  Oral    SpO2: 99% 98% 97% 97%  Weight:      Height:        Intake/Output Summary (Last 24 hours) at 04/02/17 1308 Last data filed at 04/02/17 1200  Gross per 24 hour  Intake             1817 ml  Output              700 ml  Net             1117 ml   Filed Weights   03/31/17 2220 04/01/17 0145 04/02/17 0500  Weight: 59.7 kg (131 lb 9.8 oz) 61.2 kg (134 lb 14.7 oz) 69.4 kg (153 lb)    Examination:  General exam: Appears comfortable, resting in bed Respiratory system: Bilateral crackles. Respiratory effort increased, RR 30. Cardiovascular system: S1 & S2 heard, irreg rhythm. No JVD, murmurs, rubs, gallops or clicks. No pedal edema. Gastrointestinal system: Abdomen is nondistended, soft and nontender. No organomegaly or masses felt. Normal bowel sounds heard. Central nervous system: Alert,  moves all extremities spontaneously, nonfocal, pupils remain unequal R>L  Extremities: Symmetric in appearance  Skin: +bilateral heel ulcers, +right lateral malleolar ulcer with screw visible. No acute infection.   Data Reviewed: I have personally reviewed following labs and imaging studies  CBC:  Recent Labs Lab 03/31/17 1524 04/01/17 0200 04/02/17 0300  WBC 13.1* 20.8* 22.5*  NEUTROABS 11.0* 18.9*  --   HGB 10.8* 8.9* 8.9*  HCT 32.2* 26.4* 26.5*  MCV 91.7 92.6 92.7  PLT 223 177 786   Basic Metabolic Panel:  Recent Labs Lab 03/31/17 1524 04/01/17 0200 04/01/17 0702 04/02/17 0300  NA 137 138 139 139  K 4.4 3.7 3.8 3.6  CL 103 107 106 108  CO2 24 21* 25 21*  GLUCOSE 82 75 90 78  BUN QUANTITY NOT SUFFICIENT, UNABLE TO PERFORM TEST 22* 21* 23*  CREATININE QUANTITY NOT SUFFICIENT, UNABLE TO PERFORM TEST 1.20* 1.33* 1.45*  CALCIUM 8.8* 8.0* 8.1* 8.0*  MG  --   --   --  1.6*   GFR: Estimated Creatinine Clearance:  26.1 mL/min (A) (by C-G formula based on SCr of 1.45 mg/dL (H)). Liver Function Tests:  Recent Labs Lab 03/31/17 1524 04/01/17 0702  AST 23 24  ALT 22 22  ALKPHOS 70 81  BILITOT 0.7 0.6  PROT 5.8* 5.7*  ALBUMIN 2.7* 2.7*   No results for input(s): LIPASE, AMYLASE in the last 168 hours. No results for input(s): AMMONIA in the last 168 hours. Coagulation Profile:  Recent Labs Lab 03/31/17 1524 04/01/17 0200 04/02/17 0300  INR 3.00 3.00 4.94*   Cardiac Enzymes:  Recent Labs Lab 04/01/17 0214  TROPONINI <0.03   BNP (last 3 results) No results for input(s): PROBNP in the last 8760 hours. HbA1C: No results for input(s): HGBA1C in the last 72 hours. CBG:  Recent Labs Lab 04/01/17 0100  GLUCAP 83   Lipid Profile: No results for input(s): CHOL, HDL, LDLCALC, TRIG, CHOLHDL, LDLDIRECT in the last 72 hours. Thyroid Function Tests: No results for input(s): TSH, T4TOTAL, FREET4, T3FREE, THYROIDAB in the last 72 hours. Anemia Panel: No results for input(s): VITAMINB12, FOLATE, FERRITIN, TIBC, IRON, RETICCTPCT in the last 72 hours. Sepsis Labs:  Recent Labs Lab 04/01/17 0214 04/01/17 0305 04/01/17 0700  PROCALCITON 8.53  --   --   LATICACIDVEN  --  2.1* 1.8    Recent Results (from the past 240 hour(s))  Urine culture     Status: Abnormal (Preliminary result)   Collection Time: 03/31/17  2:49 PM  Result Value Ref Range Status   Specimen Description URINE, CLEAN CATCH  Final   Special Requests NONE  Final   Culture >=100,000 COLONIES/mL GRAM NEGATIVE RODS (A)  Final   Report Status PENDING  Incomplete  Blood culture (routine x 2)     Status: None (Preliminary result)   Collection Time: 03/31/17  6:26 PM  Result Value Ref Range Status   Specimen Description BLOOD LEFT ANTECUBITAL  Final   Special Requests   Final    IN PEDIATRIC BOTTLE Blood Culture results may not be optimal due to an excessive volume of blood received in culture bottles   Culture  Setup Time    Final    GRAM NEGATIVE RODS IN PEDIATRIC BOTTLE CRITICAL RESULT CALLED TO, READ BACK BY AND VERIFIED WITH: PHARMD N [place] [phone] MLM    Culture   Final    GRAM NEGATIVE RODS IDENTIFICATION AND SUSCEPTIBILITIES TO FOLLOW Performed at [facility], [place]  [address], [place], [place] [address]    Report Status PENDING  Incomplete  Blood Culture ID Panel (Reflexed)     Status: Abnormal   Collection Time: 03/31/17  6:26 PM  Result Value Ref Range Status   Enterococcus species NOT DETECTED NOT DETECTED Final   Vancomycin resistance NOT DETECTED NOT DETECTED Final   Listeria monocytogenes NOT DETECTED NOT DETECTED Final   Staphylococcus species NOT DETECTED NOT DETECTED Final   Staphylococcus aureus NOT DETECTED NOT DETECTED Final   Methicillin resistance NOT DETECTED NOT DETECTED Final   Streptococcus species NOT DETECTED NOT DETECTED Final   Streptococcus agalactiae NOT DETECTED NOT DETECTED Final   Streptococcus pneumoniae NOT DETECTED NOT DETECTED Final   Streptococcus pyogenes NOT DETECTED NOT DETECTED Final   Acinetobacter baumannii NOT DETECTED NOT DETECTED Final   Enterobacteriaceae species DETECTED (A) NOT DETECTED Final    Comment: CRITICAL RESULT CALLED TO, READ BACK BY AND VERIFIED WITH: PHARMD N GLOGOVAC [phone] MLM    Enterobacter cloacae complex NOT DETECTED NOT DETECTED Final   Escherichia coli NOT DETECTED NOT DETECTED Final   Klebsiella oxytoca NOT DETECTED NOT DETECTED Final   Klebsiella pneumoniae NOT DETECTED NOT DETECTED Final   Proteus species DETECTED (A) NOT DETECTED Final    Comment: CRITICAL RESULT CALLED TO, READ BACK BY AND VERIFIED WITH: PHARMD N GLOGOVAC [phone] MLM    Serratia marcescens NOT DETECTED NOT DETECTED Final   Carbapenem resistance NOT DETECTED NOT DETECTED Final   Haemophilus influenzae NOT DETECTED NOT DETECTED Final   Neisseria meningitidis NOT DETECTED NOT DETECTED Final   Pseudomonas aeruginosa NOT DETECTED NOT  DETECTED Final   Candida albicans NOT DETECTED NOT DETECTED Final   Candida glabrata NOT DETECTED NOT DETECTED Final   Candida krusei NOT DETECTED NOT DETECTED Final   Candida parapsilosis NOT DETECTED NOT DETECTED Final   Candida tropicalis NOT DETECTED NOT DETECTED Final    Comment: Performed at [facility], [place] [address] [place], [place] [address]  Blood culture (routine x 2)     Status: None (Preliminary result)   Collection Time: 03/31/17  6:43 PM  Result Value Ref Range Status   Specimen Description BLOOD RIGHT ARM  Final   Special Requests   Final    IN PEDIATRIC BOTTLE Blood Culture results may not be optimal due to an excessive volume of blood received in culture bottles   Culture  Setup Time   Final    GRAM NEGATIVE RODS IN PEDIATRIC BOTTLE CRITICAL VALUE NOTED.  VALUE IS CONSISTENT WITH PREVIOUSLY REPORTED AND CALLED VALUE.    Culture   Final    GRAM NEGATIVE RODS IDENTIFICATION AND SUSCEPTIBILITIES TO FOLLOW Performed at [facility], [place] [address], [place], [place] [address]    Report Status PENDING  Incomplete  MRSA PCR Screening     Status: None   Collection Time: 04/01/17  4:32 AM  Result Value Ref Range Status   MRSA by PCR NEGATIVE NEGATIVE Final    Comment:        The GeneXpert MRSA Assay (FDA approved for NASAL specimens only), is one component of a comprehensive MRSA colonization surveillance program. It is not intended to diagnose MRSA infection nor to guide or monitor treatment for MRSA infections.        Radiology Studies: Dg Chest Port 1 View  Result Date: 04/02/2017 CLINICAL DATA:  Follow-up pleural effusions EXAM: PORTABLE CHEST 1 VIEW COMPARISON:  04/01/2017 FINDINGS: Right-sided PICC line is again seen. Bilateral  pleural effusions right greater than left are noted. Bibasilar airspace density is again seen. Aortic calcifications are again noted. No acute bony abnormality is seen. IMPRESSION: Stable bibasilar changes.  Electronically Signed   By: Inez Catalina M.D.   On: 04/02/2017 06:57   Dg Chest Port 1 View  Result Date: 04/01/2017 CLINICAL DATA:  Central line placement EXAM: PORTABLE CHEST 1 VIEW COMPARISON:  04/01/2017 FINDINGS: Right upper extremity catheter has been placed, the tip projects over the low right atrium. Bilateral pleural effusions left greater than right. Cardiomegaly with central vascular congestion and perihilar edema. No change in left greater than right bibasilar consolidation. Aortic atherosclerosis. IMPRESSION: 1. Right upper extremity catheter tip overlies the low right atrium 2. Left greater than right pleural effusions. Continued cardiomegaly with vascular congestion and perihilar edema. No change in bibasilar airspace disease. Electronically Signed   By: Donavan Foil M.D.   On: 04/01/2017 21:13   Dg Chest Port 1 View  Result Date: 04/01/2017 CLINICAL DATA:  Shortness of breath EXAM: PORTABLE CHEST 1 VIEW COMPARISON:  03/31/2017 FINDINGS: Increased bilateral pleural effusions, left greater than right. Cardiomegaly with central congestion and hazy pulmonary opacities suggesting edema. Bibasilar atelectasis or pneumonia. Aortic atherosclerosis. IMPRESSION: Worsened bilateral pleural effusions. Cardiomegaly with increased vascular congestion and pulmonary edema. Increased bibasilar opacity may reflect atelectasis or pneumonia. Electronically Signed   By: Donavan Foil M.D.   On: 04/01/2017 03:34   Dg Chest Port 1 View  Result Date: 03/31/2017 CLINICAL DATA:  Pulmonary edema.  Fatigue and decreased appetite. EXAM: PORTABLE CHEST 1 VIEW COMPARISON:  Frontal and lateral views 03/21/2017 FINDINGS: Unchanged cardiomegaly with tortuous atherosclerotic thoracic aorta. Decreased pulmonary edema from prior exam. Decreased bilateral pleural effusions with persistent small volume pleural fluid bilaterally. Likely associated compressive atelectasis. No new focal airspace disease. The bones are under  mineralized. IMPRESSION: 1. Decreased pulmonary edema and pleural effusions from prior exam. 2. Stable cardiomegaly and tortuous atherosclerotic thoracic aorta. Electronically Signed   By: Jeb Levering M.D.   On: 03/31/2017 20:21   Ct Head Code Stroke Wo Contrast  Result Date: 04/01/2017 CLINICAL DATA:  Code stroke. Unresponsive. History of atrial fibrillation. EXAM: CT HEAD WITHOUT CONTRAST TECHNIQUE: Contiguous axial images were obtained from the base of the skull through the vertex without intravenous contrast. COMPARISON:  None available, examination interpretedduring PACS downtime. FINDINGS: BRAIN: No intraparenchymal hemorrhage, mass effect nor midline shift. The ventricles and sulci are normal for age. Confluent supratentorial white matter hypodensities. Chronic appearing bilateral basal ganglia lacunar infarcts. Mild RIGHT cerebral peduncle volume loss compatible little wallerian degeneration. No acute large vascular territory infarcts. No abnormal extra-axial fluid collections. Basal cisterns are patent. VASCULAR: Moderate calcific atherosclerosis of the carotid siphons. SKULL: No skull fracture. No significant scalp soft tissue swelling. SINUSES/ORBITS: The mastoid air-cells and included paranasal sinuses are well-aerated.The included ocular globes and orbital contents are non-suspicious. Status post bilateral ocular lens implants. OTHER: Patient is edentulous. ASPECTS [facility] Stroke Program Early CT Score) - Ganglionic level infarction (caudate, lentiform nuclei, internal capsule, insula, M1-M3 cortex): 7 - Supraganglionic infarction (M4-M6 cortex): 3 Total score (0-10 with 10 being normal): 10 IMPRESSION: 1. No acute intracranial process. 2. Moderate to severe chronic small vessel ischemic disease. Chronic appearing lacunar infarcts. 3. ASPECTS is 10. 4. Critical Value/emergent results were called by telephone at the time of interpretation on 04/01/2017 at 1:30 am to Dr. Cheral Marker, Neurology, who  verbally acknowledged these results. Electronically Signed   By: Elon Alas M.D.   On:  04/01/2017 01:34      Scheduled Meds: . Chlorhexidine Gluconate Cloth  6 each Topical Daily  . collagenase   Topical Daily  . dorzolamide  1 drop Left Eye BID  . feeding supplement (ENSURE ENLIVE)  237 mL Oral BID BM  . FLUoxetine  40 mg Oral Daily  . latanoprost  1 drop Left Eye QHS  . [START ON 04/03/2017] levothyroxine  75 mcg Oral QAC breakfast  . lip balm      . mirtazapine  7.5 mg Oral QHS  . multivitamin  15 mL Oral Daily  . pantoprazole (PROTONIX) IV  40 mg Intravenous Q24H  . pilocarpine  1 drop Left Eye BID  . rOPINIRole  1 mg Oral QHS  . timolol  1 drop Left Eye BID  . Warfarin - Pharmacist Dosing Inpatient   Does not apply q1800   Continuous Infusions: . cefTRIAXone (ROCEPHIN)  IV Stopped (04/01/17 1455)     LOS: 1 day    Time spent: 30 minutes   Dessa Phi, DO [facility] [email or web address] Password [email or web address] 04/02/2017, 1:08 PM

## 2017-04-02 NOTE — Progress Notes (Signed)
CRITICAL VALUE ALERT  Critical Value:  INR 4.94  Date & Time Notied:  04/02/17 0357  Provider Notified: Dr.  Hal Hope  Orders Received/Actions taken: MD notified. Awaiting orders. Will continue to monitor closely.

## 2017-04-03 LAB — CULTURE, BLOOD (ROUTINE X 2)

## 2017-04-03 LAB — CBC WITH DIFFERENTIAL/PLATELET
BASOS PCT: 0 %
Basophils Absolute: 0 10*3/uL (ref 0.0–0.1)
EOS PCT: 0 %
Eosinophils Absolute: 0 10*3/uL (ref 0.0–0.7)
HEMATOCRIT: 26.1 % — AB (ref 36.0–46.0)
Hemoglobin: 8.9 g/dL — ABNORMAL LOW (ref 12.0–15.0)
Lymphocytes Relative: 3 %
Lymphs Abs: 0.5 10*3/uL — ABNORMAL LOW (ref 0.7–4.0)
MCH: 31.1 pg (ref 26.0–34.0)
MCHC: 34.1 g/dL (ref 30.0–36.0)
MCV: 91.3 fL (ref 78.0–100.0)
MONO ABS: 0.7 10*3/uL (ref 0.1–1.0)
MONOS PCT: 4 %
NEUTROS ABS: 17.2 10*3/uL — AB (ref 1.7–7.7)
Neutrophils Relative %: 94 %
PLATELETS: 98 10*3/uL — AB (ref 150–400)
RBC: 2.86 MIL/uL — ABNORMAL LOW (ref 3.87–5.11)
RDW: 17.5 % — AB (ref 11.5–15.5)
WBC: 18.4 10*3/uL — ABNORMAL HIGH (ref 4.0–10.5)

## 2017-04-03 LAB — URINE CULTURE: Culture: >=100000 — AB

## 2017-04-03 LAB — MAGNESIUM: Magnesium: 2.3 mg/dL (ref 1.7–2.4)

## 2017-04-03 LAB — PROCALCITONIN: PROCALCITONIN: 43.47 ng/mL

## 2017-04-03 LAB — BASIC METABOLIC PANEL
Anion gap: 10 (ref 5–15)
BUN: 26 mg/dL — ABNORMAL HIGH (ref 6–20)
CALCIUM: 8.2 mg/dL — AB (ref 8.9–10.3)
CO2: 22 mmol/L (ref 22–32)
CREATININE: 1.32 mg/dL — AB (ref 0.44–1.00)
Chloride: 107 mmol/L (ref 101–111)
GFR, EST AFRICAN AMERICAN: 40 mL/min — AB (ref >60)
GFR, EST NON AFRICAN AMERICAN: 35 mL/min — AB (ref >60)
GLUCOSE: 110 mg/dL — AB (ref 65–99)
Potassium: 3.3 mmol/L — ABNORMAL LOW (ref 3.5–5.1)
Sodium: 139 mmol/L (ref 135–145)

## 2017-04-03 LAB — PROTIME-INR
INR: 4.35 — AB
PROTHROMBIN TIME: 42.8 s — AB (ref 11.4–15.2)

## 2017-04-03 MED ORDER — POTASSIUM CHLORIDE CRYS ER 20 MEQ PO TBCR
20.0000 meq | EXTENDED_RELEASE_TABLET | Freq: Two times a day (BID) | ORAL | Status: AC
  Administered 2017-04-03 – 2017-04-04 (×2): 20 meq via ORAL
  Filled 2017-04-03 (×3): qty 1

## 2017-04-03 MED ORDER — ALTEPLASE 2 MG IJ SOLR
2.0000 mg | Freq: Once | INTRAMUSCULAR | Status: AC
  Administered 2017-04-03: 2 mg
  Filled 2017-04-03: qty 2

## 2017-04-03 MED ORDER — FUROSEMIDE 10 MG/ML IJ SOLN
40.0000 mg | Freq: Once | INTRAMUSCULAR | Status: AC
  Administered 2017-04-03: 40 mg via INTRAVENOUS

## 2017-04-03 MED ORDER — POTASSIUM CHLORIDE CRYS ER 20 MEQ PO TBCR
40.0000 meq | EXTENDED_RELEASE_TABLET | Freq: Once | ORAL | Status: AC
  Administered 2017-04-03: 40 meq via ORAL
  Filled 2017-04-03: qty 2

## 2017-04-03 MED ORDER — FUROSEMIDE 10 MG/ML IJ SOLN
INTRAMUSCULAR | Status: AC
Start: 2017-04-03 — End: 2017-04-03
  Filled 2017-04-03: qty 4

## 2017-04-03 MED ORDER — FUROSEMIDE 10 MG/ML IJ SOLN
40.0000 mg | Freq: Once | INTRAMUSCULAR | Status: AC
  Administered 2017-04-03: 40 mg via INTRAVENOUS
  Filled 2017-04-03: qty 4

## 2017-04-03 MED ORDER — MEROPENEM 1 G IV SOLR
1.0000 g | Freq: Two times a day (BID) | INTRAVENOUS | Status: DC
  Administered 2017-04-03 – 2017-04-08 (×12): 1 g via INTRAVENOUS
  Filled 2017-04-03 (×13): qty 1

## 2017-04-03 NOTE — Progress Notes (Signed)
Pharmacy Antibiotic Note  [name] is a 83 y.o. female admitted on 03/31/2017 after failure of outpateint treatment for URI/AECOPD. Patient was started on Rocephin for Proteus in blood and urine, resistance pattern in UCx is suspicious for ESBL however, and Pharmacy has been consulted for meropenem dosing.   Plan:  Meropenem 1g IV q12 hr  Height: 5\' 7"  (170.2 cm) Weight: 153 lb 7 oz (69.6 kg) IBW/kg (Calculated) : 61.6  Temp (24hrs), Avg:97.6 F (36.4 C), Min:97.4 F (36.3 C), Max:97.7 F (36.5 C)   Recent Labs Lab 03/31/17 1524 04/01/17 0200 04/01/17 0305 04/01/17 0700 04/01/17 0702 04/02/17 0300 04/03/17 0337  WBC 13.1* 20.8*  --   --   --  22.5* 18.4*  CREATININE QUANTITY NOT SUFFICIENT, UNABLE TO PERFORM TEST 1.20*  --   --  1.33* 1.45* 1.32*  LATICACIDVEN  --   --  2.1* 1.8  --   --   --     Estimated Creatinine Clearance: 28.6 mL/min (A) (by C-G formula based on SCr of 1.32 mg/dL (H)).    No Known Allergies  Antimicrobials this admission:  8/15 >> CTX >> 8/18 8/18 >> Meropenem >>  Dose adjustments this admission:  --  Microbiology results:  8/15 BCx: 2/2 Proteus 8/15 UCx: >100k Proteus mirabilis ESBL 8/16 MRSA PCR: negative  Thank you for allowing pharmacy to be a part of this patient's care.  Reuel Boom, PharmD, BCPS Pager: [phone] 04/03/2017, 9:46 AM

## 2017-04-03 NOTE — Progress Notes (Signed)
Pt. SOB,  RR 25-30/min. SaO2 100 % on 3 L O2. On auscultation fine crackles heard in the lower lung bases. MD notified. 40 mg IV Lasix given per MD order. Will continue to monitor.

## 2017-04-03 NOTE — Progress Notes (Signed)
CRITICAL VALUE ALERT  Critical Value:  INR 4.35++++  Date & Time Notied: 04/03/17++  Provider Notified: yes (E-link)  Orders Received/Actions taken:

## 2017-04-03 NOTE — Progress Notes (Signed)
ANTICOAGULATION CONSULT NOTE - Follow Up Consult  Pharmacy Consult for Warfarin Indication: atrial fibrillation  No Known Allergies   Vital Signs: Temp: 97.7 F (36.5 C) (08/18 0800) Temp Source: Oral (08/18 0800) BP: 111/85 (08/18 0100) Pulse Rate: 47 (08/18 0100)  Labs:  Recent Labs  04/01/17 0200 04/01/17 0214 04/01/17 0702 04/02/17 0300 04/03/17 0337  HGB 8.9*  --   --  8.9* 8.9*  HCT 26.4*  --   --  26.5* 26.1*  PLT 177  --   --  180 98*  LABPROT 31.8*  --   --  47.4* 42.8*  INR 3.00  --   --  4.94* 4.35*  CREATININE 1.20*  --  1.33* 1.45* 1.32*  TROPONINI  --  <0.03  --   --   --     Estimated Creatinine Clearance: 28.6 mL/min (A) (by C-G formula based on SCr of 1.32 mg/dL (H)).   Medical History: Past Medical History:  Diagnosis Date  . Atrial fibrillation (HCC)    chronic Coumadin.   Marland Kitchen COPD (chronic obstructive pulmonary disease) ([place])   . Glaucoma   . Hypothyroidism   . Neuropathy    PERIPHERAL  . Osteoarthritis   . Osteoporosis   . Trimalleolar fracture    LEFT TRIMALLEOLAR FRACTURE, DISLOCATION WITH OBLIQUE FRACTURE OF THE DISTAL FIBULAR DIAPHYSIS, AND NOTED BEING MILDLY COMMINUTED     Assessment: 66 y/oF with PMH of atrial fibrillation on warfarin, chronic respiratory failure, combined systolic and diastolic heart failure, HTN, hypothyroidism, chronic pressure ulcers who was seen in ED 03/21/2017 and diagnosed with URI/COPD, UTI-sent home with Doxycycline and Cephalexin. Patient presented to [facility] ED today, 03/31/2017, with increased lethargy, weakness, confusion, decreased PO intake, intermittent lower abdominal/pelvic discomfort, and hematuria. UA c/w UTI. Pharmacy consulted by [facility] to assist with dosing of warfarin while patient in the hospital. INR on admission 3. CBC reveals Hgb 10.8 (stable as compared to previous values), Pltc WNL. Home dose of warfarin reported as 1mg  PO daily, with last dose taken 03/30/2017 at 1800.   Today, 04/03/2017  INR  remains supratherapeutic - 4.35 today  Hgb low but stable at 8.9, Plts dropping 98  Hematuria resolved  Dysphagia diet + Ensure ordered  Goal of Therapy:  INR 2-3   Plan:   Hold warfarin today  Daily PT/INR.   Monitor CBC and for worsening hematuria/bleeding.  Peggyann Juba, PharmD, BCPS Pager: [phone] 03/31/2017 8:43 PM

## 2017-04-03 NOTE — Progress Notes (Signed)
PROGRESS NOTE    [name]  VWP:[id number] DOB: 1926-11-03 DOA: 03/31/2017 PCP: Tamsen Roers, MD     Brief Narrative:  [name] is a 83 y.o. female with medical history significant of atrial fib on coumadin, chronic respiratory failure on 2.5-3L at baseline O2, chronic combined systolic and diastolic heart failure, HTN, hypothyroidism, and chronic pressure ulcers. She was evaluated in the ED a week ago for confusion and lethargy, was diagnosed with URI/COPD exacerbation as well as UTI. She was given steroids, doxycyline and keflex and sent home. Since then, she has not gotten any better. She has continued to be lethargic, not acting her normal self, weak, confused, with decreased PO intake. At baseline, she does not ambulate. She is able to assist in transferring to motorized wheelchair.   Overnight on 8/15, she became more short of breath with labored breathing. Chest x-ray was completed which did show pulmonary edema. She was given 20 mg of IV Lasix. During nursing evaluation, patient was seen leaning to the left, left facial droop, more difficult to arouse, unequal pupils. Rapid response was called. Patient underwent CT head stat which was unremarkable for acute etiology. During CT, patient's blood pressure dropped to the 60s. She was given 1 L fluid bolus and transferred to ICU, PCCM was consulted, patient started on vasopressor. Neurology was consulted over the phone, they recommended MRI, MRA brain.   On morning of 8/16, symptoms have resolved. Further questioning of family found that patient has these episodes chronically at home of difficult arousal and has unequal pupils at baseline due to glaucoma, although typically left side is larger than right side. She became verbal with improvement in BP. PCCM canceled MRI/MRA as they did not feel stroke work up was necessary.   Blood cultures returned with proteus and rocephin was increased to 2g IV. Sensitivity returned ESBL and  antibiotics changed to merrem.   Assessment & Plan:   Principal Problem:   Acute metabolic encephalopathy Active Problems:   UTI (urinary tract infection)   Pressure injury of skin   Protein-calorie malnutrition, severe   Labored breathing  Acute toxic encephalopathy -Treat infection as below -Resolved, verbal and oriented this morning   Septic shock secondary to ESBL proteus UTI (POA) and proteus bacteremia  -Failure of outpatient treatment with Keflex -Urine culture with ESBL proteus mirabilis  -Blood culture with 2 of 2 proteus, sensitivity pending -Change antibiotics to merrem today  -Off vasopressor. Cortisol 39.2   Acute neurologic change 8/15-8/16 -Left facial droop, unequal pupils (per daughter, patient's pupils are unequal at baseline with L>R due to glaucoma. However on my examination, pupils are R>L which is the opposite of her baseline pupil difference)  -CT head without acute etiology -Overnight team 8/15 spoke with neurology on call who recommended MRI, MRA to r/o stroke, but has since been canceled by [facility] team as mentation and neurologic change improved and back to baseline. Per family, patient does not tolerate MRI well so we can hold off.  -EEG completed which showed diffuse cerebral dysfunction that is non-specific in etiology and can be seen with hypoxic/ischemic injury, toxic/metabolic encephalopathies, neurodegenerative disorders, or medication effect -Resolved and back to baseline   Acute on chronic systolic and diastolic heart failure -EF 30-35% -Follows with Dr. Einar Gip -Spoke with Dr. Einar Gip over the phone 8/16. He recommends lasix 40mg  IV BID and continue to monitor. However due to hypotension, lasix could not be given.  -Will give 1 time dose IV lasix 40mg  today due  to worsening SOB with bilateral crackles. Continue to monitor UOP, respiratory status, BP   AKI -Baseline Cr 1.1. Due to combination of hypotension, infection, lasix  -Continue to treat  infection as above -Monitor UOP  -Renal US unremarkable, limited exam due to positioning -Stable today   Chronic atrial fibrillation -Coumadin per pharmacy. INR supratherapeutic today    Chronic respiratory failure -2.5-3L [place] O2 at baseline   Chronic pressure ulcers on her coccyx, bilateral heels, right lateral malleoli -Does not appear acutely infected -Turn every 2 hours -Wound RN consult  Hypertension -Hold antihypertensives due to low BP. Monitor BP.   Hypothyroidism -Continue Synthroid  Hematuria -In setting of UTI and coumadin use. Resolved.   Goals of care -Full code in this 83 yo female with multiple medical problems, septic shock, respiratory failure, and nonambulatory at baseline with multiple pressure wounds. Discussed with daughter about goals of care. Patient had been DNR at one point in the past but currently remains full code. Recommend palliative care consult.  -Appreciate palliative care medicine. Remains full code.    DVT prophylaxis: coumadin Code Status: full, confirmed with daughter Family Communication: spoke with daughter over phone this afternoon  Disposition Plan: pending improvement, stabilization   Consultants:   PCCM  Neurology over the phone 8/15   Cardiology over the phone 8/16  Palliative care   Procedures:   None  Antimicrobials:  Anti-infectives    Start     Dose/Rate Route Frequency Ordered Stop   04/03/17 1000  meropenem (MERREM) 1 g in sodium chloride 0.9 % 100 mL IVPB     1 g 200 mL/hr over 30 Minutes Intravenous Every 12 hours 04/03/17 0948     04/01/17 1800  cefTRIAXone (ROCEPHIN) 1 g in dextrose 5 % 50 mL IVPB  Status:  Discontinued     1 g 100 mL/hr over 30 Minutes Intravenous Every 24 hours 03/31/17 1950 04/01/17 1353   04/01/17 1400  cefTRIAXone (ROCEPHIN) 2 g in dextrose 5 % 50 mL IVPB  Status:  Discontinued     2 g 100 mL/hr over 30 Minutes Intravenous Every 24 hours 04/01/17 1353 04/03/17 0948   03/31/17  1630  cefTRIAXone (ROCEPHIN) 1 g in dextrose 5 % 50 mL IVPB     1 g 100 mL/hr over 30 Minutes Intravenous  Once 03/31/17 1617 03/31/17 2038       Subjective: Patient alert and awake this morning. Asking if she is doing okay and what can she do to get better. She has no complaints of pain or discomfort. States breathing is okay. No nausea, abdominal pain.    Objective: Vitals:   04/03/17 0900 04/03/17 1000 04/03/17 1100 04/03/17 1200  BP: (!) 163/89 (!) 141/82 (!) 168/80 (!) 141/64  Pulse: 69 (!) 40 98 96  Resp: (!) 21 (!) 37 (!) 28 (!) 35  Temp:      TempSrc:      SpO2: 100% 97% 97% 98%  Weight:      Height:        Intake/Output Summary (Last 24 hours) at 04/03/17 1238 Last data filed at 04/03/17 0830  Gross per 24 hour  Intake              290 ml  Output              900 ml  Net             -610 ml   Filed Weights   04/01/17 0145 04/02/17 0500 04/03/17  0500  Weight: 61.2 kg (134 lb 14.7 oz) 69.4 kg (153 lb) 69.6 kg (153 lb 7 oz)    Examination:  General exam: Appears comfortable, resting in bed Respiratory system: Bilateral crackles. Respiratory effort increased, RR 30. Cardiovascular system: S1 & S2 heard, irreg rhythm. No JVD, murmurs, rubs, gallops or clicks. No pedal edema. Gastrointestinal system: Abdomen is nondistended, soft and nontender. No organomegaly or masses felt. Normal bowel sounds heard. Central nervous system: Alert, moves all extremities spontaneously, nonfocal, pupils remain unequal R>L  Extremities: Symmetric in appearance  Skin: +bilateral heel ulcers, +right lateral malleolar ulcer with screw visible. No acute infection.   Data Reviewed: I have personally reviewed following labs and imaging studies  CBC:  Recent Labs Lab 03/31/17 1524 04/01/17 0200 04/02/17 0300 04/03/17 0337  WBC 13.1* 20.8* 22.5* 18.4*  NEUTROABS 11.0* 18.9*  --  17.2*  HGB 10.8* 8.9* 8.9* 8.9*  HCT 32.2* 26.4* 26.5* 26.1*  MCV 91.7 92.6 92.7 91.3  PLT 223 177 180  98*   Basic Metabolic Panel:  Recent Labs Lab 03/31/17 1524 04/01/17 0200 04/01/17 0702 04/02/17 0300 04/03/17 0337  NA 137 138 139 139 139  K 4.4 3.7 3.8 3.6 3.3*  CL 103 107 106 108 107  CO2 24 21* 25 21* 22  GLUCOSE 82 75 90 78 110*  BUN QUANTITY NOT SUFFICIENT, UNABLE TO PERFORM TEST 22* 21* 23* 26*  CREATININE QUANTITY NOT SUFFICIENT, UNABLE TO PERFORM TEST 1.20* 1.33* 1.45* 1.32*  CALCIUM 8.8* 8.0* 8.1* 8.0* 8.2*  MG  --   --   --  1.6* 2.3   GFR: Estimated Creatinine Clearance: 28.6 mL/min (A) (by C-G formula based on SCr of 1.32 mg/dL (H)). Liver Function Tests:  Recent Labs Lab 03/31/17 1524 04/01/17 0702  AST 23 24  ALT 22 22  ALKPHOS 70 81  BILITOT 0.7 0.6  PROT 5.8* 5.7*  ALBUMIN 2.7* 2.7*   No results for input(s): LIPASE, AMYLASE in the last 168 hours. No results for input(s): AMMONIA in the last 168 hours. Coagulation Profile:  Recent Labs Lab 03/31/17 1524 04/01/17 0200 04/02/17 0300 04/03/17 0337  INR 3.00 3.00 4.94* 4.35*   Cardiac Enzymes:  Recent Labs Lab 04/01/17 0214  TROPONINI <0.03   BNP (last 3 results) No results for input(s): PROBNP in the last 8760 hours. HbA1C: No results for input(s): HGBA1C in the last 72 hours. CBG:  Recent Labs Lab 04/01/17 0100  GLUCAP 83   Lipid Profile: No results for input(s): CHOL, HDL, LDLCALC, TRIG, CHOLHDL, LDLDIRECT in the last 72 hours. Thyroid Function Tests: No results for input(s): TSH, T4TOTAL, FREET4, T3FREE, THYROIDAB in the last 72 hours. Anemia Panel: No results for input(s): VITAMINB12, FOLATE, FERRITIN, TIBC, IRON, RETICCTPCT in the last 72 hours. Sepsis Labs:  Recent Labs Lab 04/01/17 0214 04/01/17 0305 04/01/17 0700 04/03/17 0337  PROCALCITON 8.53  --   --  43.47  LATICACIDVEN  --  2.1* 1.8  --     Recent Results (from the past 240 hour(s))  Urine culture     Status: Abnormal   Collection Time: 03/31/17  2:49 PM  Result Value Ref Range Status   Specimen  Description URINE, CLEAN CATCH  Final   Special Requests NONE  Final   Culture (A)  Final    >=100,000 COLONIES/mL PROTEUS MIRABILIS Susceptibility Pattern Suggests Possibility of an Extended Spectrum Beta Lactamase Producer. Contact Laboratory Within 7 Days if Confirmation Warranted. Performed at [facility], [place] [address],  [place], [place] [address]    Report Status 04/03/2017 FINAL  Final   Organism ID, Bacteria PROTEUS MIRABILIS (A)  Final      Susceptibility   Proteus mirabilis - MIC*    AMPICILLIN >=32 RESISTANT Resistant     CEFAZOLIN >=64 RESISTANT Resistant     CEFTRIAXONE >=64 RESISTANT Resistant     CIPROFLOXACIN >=4 RESISTANT Resistant     GENTAMICIN <=1 SENSITIVE Sensitive     IMIPENEM 2 SENSITIVE Sensitive     NITROFURANTOIN 128 RESISTANT Resistant     TRIMETH/SULFA >=320 RESISTANT Resistant     AMPICILLIN/SULBACTAM 8 SENSITIVE Sensitive     PIP/TAZO <=4 SENSITIVE Sensitive     * >=100,000 COLONIES/mL PROTEUS MIRABILIS  Blood culture (routine x 2)     Status: Abnormal   Collection Time: 03/31/17  6:26 PM  Result Value Ref Range Status   Specimen Description BLOOD LEFT ANTECUBITAL  Final   Special Requests   Final    IN PEDIATRIC BOTTLE Blood Culture results may not be optimal due to an excessive volume of blood received in culture bottles   Culture  Setup Time   Final    GRAM NEGATIVE RODS IN PEDIATRIC BOTTLE CRITICAL RESULT CALLED TO, READ BACK BY AND VERIFIED WITH: PHARMD N [place] [id number] MLM    Culture (A)  Final    PROTEUS MIRABILIS Susceptibility Pattern Suggests Possibility of an Extended Spectrum Beta Lactamase Producer. Contact Laboratory Within 7 Days if Confirmation Warranted. Performed at [facility], [place] [address] [place], [place] [address]    Report Status 04/03/2017 FINAL  Final   Organism ID, Bacteria PROTEUS MIRABILIS  Final      Susceptibility   Proteus mirabilis - MIC*    AMPICILLIN >=32 RESISTANT Resistant      CEFAZOLIN >=64 RESISTANT Resistant     CEFEPIME RESISTANT Resistant     CEFTAZIDIME RESISTANT Resistant     CEFTRIAXONE >=64 RESISTANT Resistant     CIPROFLOXACIN >=4 RESISTANT Resistant     GENTAMICIN <=1 SENSITIVE Sensitive     IMIPENEM 1 SENSITIVE Sensitive     TRIMETH/SULFA >=320 RESISTANT Resistant     AMPICILLIN/SULBACTAM 8 SENSITIVE Sensitive     PIP/TAZO <=4 SENSITIVE Sensitive     * PROTEUS MIRABILIS  Blood Culture ID Panel (Reflexed)     Status: Abnormal   Collection Time: 03/31/17  6:26 PM  Result Value Ref Range Status   Enterococcus species NOT DETECTED NOT DETECTED Final   Vancomycin resistance NOT DETECTED NOT DETECTED Final   Listeria monocytogenes NOT DETECTED NOT DETECTED Final   Staphylococcus species NOT DETECTED NOT DETECTED Final   Staphylococcus aureus NOT DETECTED NOT DETECTED Final   Methicillin resistance NOT DETECTED NOT DETECTED Final   Streptococcus species NOT DETECTED NOT DETECTED Final   Streptococcus agalactiae NOT DETECTED NOT DETECTED Final   Streptococcus pneumoniae NOT DETECTED NOT DETECTED Final   Streptococcus pyogenes NOT DETECTED NOT DETECTED Final   Acinetobacter baumannii NOT DETECTED NOT DETECTED Final   Enterobacteriaceae species DETECTED (A) NOT DETECTED Final    Comment: CRITICAL RESULT CALLED TO, READ BACK BY AND VERIFIED WITH: PHARMD N GLOGOVAC [id number] MLM    Enterobacter cloacae complex NOT DETECTED NOT DETECTED Final   Escherichia coli NOT DETECTED NOT DETECTED Final   Klebsiella oxytoca NOT DETECTED NOT DETECTED Final   Klebsiella pneumoniae NOT DETECTED NOT DETECTED Final   Proteus species DETECTED (A) NOT DETECTED Final    Comment: CRITICAL RESULT CALLED TO, READ BACK  BY AND VERIFIED WITH: PHARMD N [place] [id number] MLM    Serratia marcescens NOT DETECTED NOT DETECTED Final   Carbapenem resistance NOT DETECTED NOT DETECTED Final   Haemophilus influenzae NOT DETECTED NOT DETECTED Final   Neisseria meningitidis NOT  DETECTED NOT DETECTED Final   Pseudomonas aeruginosa NOT DETECTED NOT DETECTED Final   Candida albicans NOT DETECTED NOT DETECTED Final   Candida glabrata NOT DETECTED NOT DETECTED Final   Candida krusei NOT DETECTED NOT DETECTED Final   Candida parapsilosis NOT DETECTED NOT DETECTED Final   Candida tropicalis NOT DETECTED NOT DETECTED Final    Comment: Performed at [facility], [place] [address], [place], [place] [address]  Blood culture (routine x 2)     Status: Abnormal   Collection Time: 03/31/17  6:43 PM  Result Value Ref Range Status   Specimen Description BLOOD RIGHT ARM  Final   Special Requests   Final    IN PEDIATRIC BOTTLE Blood Culture results may not be optimal due to an excessive volume of blood received in culture bottles   Culture  Setup Time   Final    GRAM NEGATIVE RODS IN PEDIATRIC BOTTLE CRITICAL VALUE NOTED.  VALUE IS CONSISTENT WITH PREVIOUSLY REPORTED AND CALLED VALUE.    Culture (A)  Final    PROTEUS MIRABILIS SUSCEPTIBILITIES PERFORMED ON PREVIOUS CULTURE WITHIN THE LAST 5 DAYS. Performed at [facility], [place] [address], [place], [place] [address]    Report Status 04/03/2017 FINAL  Final  MRSA PCR Screening     Status: None   Collection Time: 04/01/17  4:32 AM  Result Value Ref Range Status   MRSA by PCR NEGATIVE NEGATIVE Final    Comment:        The GeneXpert MRSA Assay (FDA approved for NASAL specimens only), is one component of a comprehensive MRSA colonization surveillance program. It is not intended to diagnose MRSA infection nor to guide or monitor treatment for MRSA infections.        Radiology Studies: US Renal  Result Date: 04/02/2017 CLINICAL DATA:  Acute kidney injury. Urinary tract infection without hematuria. EXAM: RENAL / URINARY TRACT ULTRASOUND COMPLETE COMPARISON:  CT 01/05/2008 FINDINGS: Right Kidney: Length: 8.7 cm. There is thinning of the renal parenchyma with increased echogenicity. No renal fluid collection. No  mass or hydronephrosis visualized. Left Kidney: Not visualized due to technical factors, patient had significant difficulty with positioning. Bladder: Nondistended and not well evaluated. IMPRESSION: 1. Findings consistent with chronic medical renal disease of the right kidney. No hydronephrosis. 2. Left kidney not visualized due to technical factors, limited patient positioning. 3. Decompressed urinary bladder. Electronically Signed   By: Jeb Levering M.D.   On: 04/02/2017 20:29   Dg Chest Port 1 View  Result Date: 04/02/2017 CLINICAL DATA:  Follow-up pleural effusions EXAM: PORTABLE CHEST 1 VIEW COMPARISON:  04/01/2017 FINDINGS: Right-sided PICC line is again seen. Bilateral pleural effusions right greater than left are noted. Bibasilar airspace density is again seen. Aortic calcifications are again noted. No acute bony abnormality is seen. IMPRESSION: Stable bibasilar changes. Electronically Signed   By: Inez Catalina M.D.   On: 04/02/2017 06:57   Dg Chest Port 1 View  Result Date: 04/01/2017 CLINICAL DATA:  Central line placement EXAM: PORTABLE CHEST 1 VIEW COMPARISON:  04/01/2017 FINDINGS: Right upper extremity catheter has been placed, the tip projects over the low right atrium. Bilateral pleural effusions left greater than right. Cardiomegaly with central vascular congestion and perihilar edema.  No change in left greater than right bibasilar consolidation. Aortic atherosclerosis. IMPRESSION: 1. Right upper extremity catheter tip overlies the low right atrium 2. Left greater than right pleural effusions. Continued cardiomegaly with vascular congestion and perihilar edema. No change in bibasilar airspace disease. Electronically Signed   By: Donavan Foil M.D.   On: 04/01/2017 21:13      Scheduled Meds: . furosemide      . Chlorhexidine Gluconate Cloth  6 each Topical Daily  . collagenase   Topical Daily  . dorzolamide  1 drop Left Eye BID  . feeding supplement (ENSURE ENLIVE)  237 mL Oral  BID BM  . FLUoxetine  40 mg Oral Daily  . latanoprost  1 drop Left Eye QHS  . levothyroxine  75 mcg Oral QAC breakfast  . mirtazapine  7.5 mg Oral QHS  . multivitamin  15 mL Oral Daily  . pantoprazole (PROTONIX) IV  40 mg Intravenous Q24H  . pilocarpine  1 drop Left Eye BID  . rOPINIRole  1 mg Oral QHS  . timolol  1 drop Left Eye BID  . Warfarin - Pharmacist Dosing Inpatient   Does not apply q1800   Continuous Infusions: . meropenem (MERREM) IV 1 g (04/03/17 1158)     LOS: 2 days    Time spent: 30 minutes   Dessa Phi, DO [facility] [email or web address] Password [facility] 04/03/2017, 12:38 PM

## 2017-04-03 NOTE — Progress Notes (Signed)
Pt c/o of sob.  Pt O2 sat are 98% on 3L.  Pt has distant cracking in lower bases.  Informed Dr. Maylene Roes.  40mg  of IV lasix ordered and administered.  Will continue to monitor.  Irven [place], RN

## 2017-04-04 ENCOUNTER — Inpatient Hospital Stay (HOSPITAL_COMMUNITY): Payer: Medicare Other

## 2017-04-04 LAB — CBC WITH DIFFERENTIAL/PLATELET
BASOS ABS: 0 10*3/uL (ref 0.0–0.1)
BASOS PCT: 0 %
EOS PCT: 0 %
Eosinophils Absolute: 0.1 10*3/uL (ref 0.0–0.7)
HCT: 25.4 % — ABNORMAL LOW (ref 36.0–46.0)
Hemoglobin: 8.6 g/dL — ABNORMAL LOW (ref 12.0–15.0)
Lymphocytes Relative: 4 %
Lymphs Abs: 0.6 10*3/uL — ABNORMAL LOW (ref 0.7–4.0)
MCH: 30.6 pg (ref 26.0–34.0)
MCHC: 33.9 g/dL (ref 30.0–36.0)
MCV: 90.4 fL (ref 78.0–100.0)
MONO ABS: 0.7 10*3/uL (ref 0.1–1.0)
Monocytes Relative: 5 %
NEUTROS ABS: 13.6 10*3/uL — AB (ref 1.7–7.7)
Neutrophils Relative %: 91 %
PLATELETS: 140 10*3/uL — AB (ref 150–400)
RBC: 2.81 MIL/uL — AB (ref 3.87–5.11)
RDW: 17.4 % — AB (ref 11.5–15.5)
WBC: 15 10*3/uL — AB (ref 4.0–10.5)

## 2017-04-04 LAB — BASIC METABOLIC PANEL
Anion gap: 6 (ref 5–15)
BUN: 22 mg/dL — ABNORMAL HIGH (ref 6–20)
CALCIUM: 8.2 mg/dL — AB (ref 8.9–10.3)
CO2: 28 mmol/L (ref 22–32)
CREATININE: 1.16 mg/dL — AB (ref 0.44–1.00)
Chloride: 107 mmol/L (ref 101–111)
GFR calc Af Amer: 47 mL/min — ABNORMAL LOW (ref >60)
GFR calc non Af Amer: 41 mL/min — ABNORMAL LOW (ref >60)
GLUCOSE: 102 mg/dL — AB (ref 65–99)
Potassium: 3.5 mmol/L (ref 3.5–5.1)
Sodium: 141 mmol/L (ref 135–145)

## 2017-04-04 LAB — PROTIME-INR
INR: 2.89
PROTHROMBIN TIME: 30.9 s — AB (ref 11.4–15.2)

## 2017-04-04 MED ORDER — ALTEPLASE 2 MG IJ SOLR
2.0000 mg | Freq: Once | INTRAMUSCULAR | Status: DC
  Filled 2017-04-04: qty 2

## 2017-04-04 MED ORDER — FUROSEMIDE 10 MG/ML IJ SOLN
40.0000 mg | Freq: Two times a day (BID) | INTRAMUSCULAR | Status: DC
  Administered 2017-04-04 – 2017-04-05 (×4): 40 mg via INTRAVENOUS
  Filled 2017-04-04 (×4): qty 4

## 2017-04-04 MED ORDER — ALTEPLASE 2 MG IJ SOLR
2.0000 mg | Freq: Once | INTRAMUSCULAR | Status: AC
  Administered 2017-04-04: 2 mg
  Filled 2017-04-04 (×2): qty 2

## 2017-04-04 MED ORDER — ALTEPLASE 2 MG IJ SOLR
2.0000 mg | Freq: Once | INTRAMUSCULAR | Status: AC
  Administered 2017-04-04: 2 mg
  Filled 2017-04-04: qty 2

## 2017-04-04 MED ORDER — WARFARIN 0.5 MG HALF TABLET
0.5000 mg | ORAL_TABLET | Freq: Once | ORAL | Status: AC
  Administered 2017-04-04: 0.5 mg via ORAL
  Filled 2017-04-04: qty 1

## 2017-04-04 NOTE — Progress Notes (Signed)
Received patient from ICU, VS obtained, telemetry applied, oriented to unit, call light placed in reach

## 2017-04-04 NOTE — Progress Notes (Signed)
ANTICOAGULATION CONSULT NOTE - Follow Up Consult  Pharmacy Consult for Warfarin Indication: atrial fibrillation  No Known Allergies   Vital Signs: Temp: 97.6 F (36.4 C) (08/19 0800) Temp Source: Oral (08/19 0800) BP: 124/82 (08/19 0600) Pulse Rate: 41 (08/19 0600)  Labs:  Recent Labs  04/02/17 0300 04/03/17 0337 04/04/17 0400  HGB 8.9* 8.9* 8.6*  HCT 26.5* 26.1* 25.4*  PLT 180 98* 140*  LABPROT 47.4* 42.8* 30.9*  INR 4.94* 4.35* 2.89  CREATININE 1.45* 1.32* 1.16*    Estimated Creatinine Clearance: 32.6 mL/min (A) (by C-G formula based on SCr of 1.16 mg/dL (H)).   Medical History: Past Medical History:  Diagnosis Date  . Atrial fibrillation (HCC)    chronic Coumadin.   Marland Kitchen COPD (chronic obstructive pulmonary disease) ([place])   . Glaucoma   . Hypothyroidism   . Neuropathy    PERIPHERAL  . Osteoarthritis   . Osteoporosis   . Trimalleolar fracture    LEFT TRIMALLEOLAR FRACTURE, DISLOCATION WITH OBLIQUE FRACTURE OF THE DISTAL FIBULAR DIAPHYSIS, AND NOTED BEING MILDLY COMMINUTED     Assessment: 34 y/oF with PMH of atrial fibrillation on warfarin, chronic respiratory failure, combined systolic and diastolic heart failure, HTN, hypothyroidism, chronic pressure ulcers who was seen in ED 03/21/2017 and diagnosed with URI/COPD, UTI-sent home with Doxycycline and Cephalexin. Patient presented to [facility] ED today, 03/31/2017, with increased lethargy, weakness, confusion, decreased PO intake, intermittent lower abdominal/pelvic discomfort, and hematuria. UA c/w UTI. Pharmacy consulted by [facility] to assist with dosing of warfarin while patient in the hospital. INR on admission 3. CBC reveals Hgb 10.8 (stable as compared to previous values), Pltc WNL. Home dose of warfarin reported as 1mg  PO daily, with last dose taken 03/30/2017 at 1800.   Today, 04/04/2017  INR has now decreased into therapeutic range - 2.89, warfarin was held 8/16-8/18 for supratherapeutic INR  Hgb low but relatively  stable at 8.6, Plts low but improved to 140  Hematuria resolved  Dysphagia diet + Ensure ordered  Goal of Therapy:  INR 2-3   Plan:   Resume small dose of warfarin today - 0.5mg   Daily PT/INR.   Monitor CBC and for worsening hematuria/bleeding.  Peggyann Juba, PharmD, BCPS Pager: [phone] 03/31/2017 8:43 PM

## 2017-04-04 NOTE — Progress Notes (Signed)
PROGRESS NOTE    [name]  JHE:[id number] DOB: 05-Aug-1927 DOA: 03/31/2017 PCP: Tamsen Roers, MD     Brief Narrative:  [name] is a 83 y.o. female with medical history significant of atrial fib on coumadin, chronic respiratory failure on 2.5-3L at baseline O2, chronic combined systolic and diastolic heart failure, HTN, hypothyroidism, and chronic pressure ulcers. She was evaluated in the ED a week ago for confusion and lethargy, was diagnosed with URI/COPD exacerbation as well as UTI. She was given steroids, doxycyline and keflex and sent home. Since then, she has not gotten any better. She has continued to be lethargic, not acting her normal self, weak, confused, with decreased PO intake. At baseline, she does not ambulate. She is able to assist in transferring to motorized wheelchair.   Overnight on 8/15, she became more short of breath with labored breathing. Chest x-ray was completed which did show pulmonary edema. She was given 20 mg of IV Lasix. During nursing evaluation, patient was seen leaning to the left, left facial droop, more difficult to arouse, unequal pupils. Rapid response was called. Patient underwent CT head stat which was unremarkable for acute etiology. During CT, patient's blood pressure dropped to the 60s. She was given 1 L fluid bolus and transferred to ICU, PCCM was consulted, patient started on vasopressor. Neurology was consulted over the phone, they recommended MRI, MRA brain.   On morning of 8/16, symptoms have resolved. Further questioning of family found that patient has these episodes chronically at home of difficult arousal and has unequal pupils at baseline due to glaucoma, although typically left side is larger than right side. She became verbal with improvement in BP. PCCM canceled MRI/MRA as they did not feel stroke work up was necessary.   Blood cultures returned with proteus and rocephin was increased to 2g IV. Sensitivity returned ESBL and  antibiotics changed to merrem.   Assessment & Plan:   Principal Problem:   Acute metabolic encephalopathy Active Problems:   UTI (urinary tract infection)   Pressure injury of skin   Protein-calorie malnutrition, severe   Labored breathing  Acute toxic encephalopathy -Treat infection as below -Resolved, verbal and oriented this morning    Septic shock secondary to ESBL proteus UTI (POA) and proteus bacteremia  -Failure of outpatient treatment with Keflex -Urine culture with ESBL proteus mirabilis  -Blood culture with ESBL proteus mirabilis  -Continue merrem  -Off vasopressor. Cortisol 39.2   Acute neurologic change 8/15-8/16 -Left facial droop, unequal pupils (per daughter, patient's pupils are unequal at baseline with L>R due to glaucoma. However on my examination, pupils are R>L which is the opposite of her baseline pupil difference)  -CT head without acute etiology -Overnight team 8/15 spoke with neurology on call who recommended MRI, MRA to r/o stroke, but has since been canceled by [facility] team as mentation and neurologic change improved and back to baseline. Per family, patient does not tolerate MRI well so we can hold off.  -EEG completed which showed diffuse cerebral dysfunction that is non-specific in etiology and can be seen with hypoxic/ischemic injury, toxic/metabolic encephalopathies, neurodegenerative disorders, or medication effect -Resolved and back to baseline   Acute on chronic systolic and diastolic heart failure -EF 30-35% -Follows with Dr. Einar Gip -Spoke with Dr. Einar Gip over the phone 8/16. He recommends lasix 40mg  IV BID and continue to monitor. However due to hypotension, lasix could not be given. She was given IVF for BP support  -Tolerated IV lasix x 2 yesterday without  drop in BP. Will continue 40mg  IV BID due to continued bibasilar crackles  -CXR this morning without significant interval change in bilateral pleural effusions, persistent pulmonary venous  congestion   AKI -Baseline Cr 1.1. Due to combination of hypotension, infection, lasix  -Continue to treat infection as above  -Monitor UOP  -Renal US unremarkable, limited exam due to positioning -Improved today   Chronic atrial fibrillation -Coumadin per pharmacy    Chronic respiratory failure -2.5-3L  O2 at baseline   Chronic pressure ulcers on her coccyx, bilateral heels, right lateral malleoli -Does not appear acutely infected -Turn every 2 hours -Wound RN consult  Hypertension -Hold antihypertensives due to low BP. Monitor BP.   Hypothyroidism -Continue Synthroid  Hematuria -In setting of UTI and coumadin use. Resolved.   Goals of care -Full code in this 83 yo female with multiple medical problems, septic shock, respiratory failure, and nonambulatory at baseline with multiple pressure wounds. Discussed with daughter about goals of care. Patient had been DNR at one point in the past but currently remains full code. Recommend palliative care consult.  -Appreciate palliative care medicine. Remains full code.    DVT prophylaxis: coumadin Code Status: full, confirmed with daughter Family Communication: spoke with daughter over phone morning 8/19  Disposition Plan: pending improvement, stabilization. Transfer to tele today.    Consultants:   PCCM  Neurology over the phone 8/15   Cardiology over the phone 8/16  Palliative care   Procedures:   None  Antimicrobials:  Anti-infectives    Start     Dose/Rate Route Frequency Ordered Stop   04/03/17 1000  meropenem (MERREM) 1 g in sodium chloride 0.9 % 100 mL IVPB     1 g 200 mL/hr over 30 Minutes Intravenous Every 12 hours 04/03/17 0948     04/01/17 1800  cefTRIAXone (ROCEPHIN) 1 g in dextrose 5 % 50 mL IVPB  Status:  Discontinued     1 g 100 mL/hr over 30 Minutes Intravenous Every 24 hours 03/31/17 1950 04/01/17 1353   04/01/17 1400  cefTRIAXone (ROCEPHIN) 2 g in dextrose 5 % 50 mL IVPB  Status:   Discontinued     2 g 100 mL/hr over 30 Minutes Intravenous Every 24 hours 04/01/17 1353 04/03/17 0948   03/31/17 1630  cefTRIAXone (ROCEPHIN) 1 g in dextrose 5 % 50 mL IVPB     1 g 100 mL/hr over 30 Minutes Intravenous  Once 03/31/17 1617 03/31/17 2038       Subjective: Patient alert and awake this morning. Asking if she can have some water. She has no complaints, denies SOB or pain.    Objective: Vitals:   04/04/17 0429 04/04/17 0600 04/04/17 0800 04/04/17 1153  BP:  124/82  121/80  Pulse:  (!) 41  85  Resp:  (!) 21  16  Temp: 97.8 F (36.6 C)  97.6 F (36.4 C) 97.9 F (36.6 C)  TempSrc: Oral  Oral Axillary  SpO2:  (!) 89%  100%  Weight: 63.5 kg (139 lb 15.9 oz)   60.3 kg (132 lb 15 oz)  Height:    5\' 9"  (1.753 m)    Intake/Output Summary (Last 24 hours) at 04/04/17 1209 Last data filed at 04/04/17 1200  Gross per 24 hour  Intake              902 ml  Output             3250 ml  Net            -  2348 ml   Filed Weights   04/03/17 0500 04/04/17 0429 04/04/17 1153  Weight: 69.6 kg (153 lb 7 oz) 63.5 kg (139 lb 15.9 oz) 60.3 kg (132 lb 15 oz)    Examination:  General exam: Appears comfortable, resting in bed Respiratory system: Bilateral crackles. Respiratory effort better today compared to yesterday, RR 20s. Cardiovascular system: S1 & S2 heard, irreg rhythm. No JVD, murmurs, rubs, gallops or clicks. No pedal edema. Gastrointestinal system: Abdomen is nondistended, soft and nontender. No organomegaly or masses felt. Normal bowel sounds heard. Central nervous system: Alert, moves all extremities spontaneously, nonfocal, pupils remain unequal R>L  Extremities: Symmetric in appearance  Skin: +bilateral heel ulcers, +right lateral malleolar ulcer with screw visible. No acute infection.   Data Reviewed: I have personally reviewed following labs and imaging studies  CBC:  Recent Labs Lab 03/31/17 1524 04/01/17 0200 04/02/17 0300 04/03/17 0337 04/04/17 0400  WBC  13.1* 20.8* 22.5* 18.4* 15.0*  NEUTROABS 11.0* 18.9*  --  17.2* 13.6*  HGB 10.8* 8.9* 8.9* 8.9* 8.6*  HCT 32.2* 26.4* 26.5* 26.1* 25.4*  MCV 91.7 92.6 92.7 91.3 90.4  PLT 223 177 180 98* 638*   Basic Metabolic Panel:  Recent Labs Lab 04/01/17 0200 04/01/17 0702 04/02/17 0300 04/03/17 0337 04/04/17 0400  NA 138 139 139 139 141  K 3.7 3.8 3.6 3.3* 3.5  CL 107 106 108 107 107  CO2 21* 25 21* 22 28  GLUCOSE 75 90 78 110* 102*  BUN 22* 21* 23* 26* 22*  CREATININE 1.20* 1.33* 1.45* 1.32* 1.16*  CALCIUM 8.0* 8.1* 8.0* 8.2* 8.2*  MG  --   --  1.6* 2.3  --    GFR: Estimated Creatinine Clearance: 31.9 mL/min (A) (by C-G formula based on SCr of 1.16 mg/dL (H)). Liver Function Tests:  Recent Labs Lab 03/31/17 1524 04/01/17 0702  AST 23 24  ALT 22 22  ALKPHOS 70 81  BILITOT 0.7 0.6  PROT 5.8* 5.7*  ALBUMIN 2.7* 2.7*   No results for input(s): LIPASE, AMYLASE in the last 168 hours. No results for input(s): AMMONIA in the last 168 hours. Coagulation Profile:  Recent Labs Lab 03/31/17 1524 04/01/17 0200 04/02/17 0300 04/03/17 0337 04/04/17 0400  INR 3.00 3.00 4.94* 4.35* 2.89   Cardiac Enzymes:  Recent Labs Lab 04/01/17 0214  TROPONINI <0.03   BNP (last 3 results) No results for input(s): PROBNP in the last 8760 hours. HbA1C: No results for input(s): HGBA1C in the last 72 hours. CBG:  Recent Labs Lab 04/01/17 0100  GLUCAP 83   Lipid Profile: No results for input(s): CHOL, HDL, LDLCALC, TRIG, CHOLHDL, LDLDIRECT in the last 72 hours. Thyroid Function Tests: No results for input(s): TSH, T4TOTAL, FREET4, T3FREE, THYROIDAB in the last 72 hours. Anemia Panel: No results for input(s): VITAMINB12, FOLATE, FERRITIN, TIBC, IRON, RETICCTPCT in the last 72 hours. Sepsis Labs:  Recent Labs Lab 04/01/17 0214 04/01/17 0305 04/01/17 0700 04/03/17 0337  PROCALCITON 8.53  --   --  43.47  LATICACIDVEN  --  2.1* 1.8  --     Recent Results (from the past 240  hour(s))  Urine culture     Status: Abnormal   Collection Time: 03/31/17  2:49 PM  Result Value Ref Range Status   Specimen Description URINE, CLEAN CATCH  Final   Special Requests NONE  Final   Culture (A)  Final    >=100,000 COLONIES/mL PROTEUS MIRABILIS Susceptibility Pattern Suggests Possibility of an Extended Spectrum Beta Lactamase Producer.  Contact Laboratory Within 7 Days if Confirmation Warranted. Performed at [facility], [address], [place], [place] [address]    Report Status 04/03/2017 FINAL  Final   Organism ID, Bacteria PROTEUS MIRABILIS (A)  Final      Susceptibility   Proteus mirabilis - MIC*    AMPICILLIN >=32 RESISTANT Resistant     CEFAZOLIN >=64 RESISTANT Resistant     CEFTRIAXONE >=64 RESISTANT Resistant     CIPROFLOXACIN >=4 RESISTANT Resistant     GENTAMICIN <=1 SENSITIVE Sensitive     IMIPENEM 2 SENSITIVE Sensitive     NITROFURANTOIN 128 RESISTANT Resistant     TRIMETH/SULFA >=320 RESISTANT Resistant     AMPICILLIN/SULBACTAM 8 SENSITIVE Sensitive     PIP/TAZO <=4 SENSITIVE Sensitive     * >=100,000 COLONIES/mL PROTEUS MIRABILIS  Blood culture (routine x 2)     Status: Abnormal   Collection Time: 03/31/17  6:26 PM  Result Value Ref Range Status   Specimen Description BLOOD LEFT ANTECUBITAL  Final   Special Requests   Final    IN PEDIATRIC BOTTLE Blood Culture results may not be optimal due to an excessive volume of blood received in culture bottles   Culture  Setup Time   Final    GRAM NEGATIVE RODS IN PEDIATRIC BOTTLE CRITICAL RESULT CALLED TO, READ BACK BY AND VERIFIED WITH: PHARMD N [place] [id number] [phone] MLM    Culture (A)  Final    PROTEUS MIRABILIS Susceptibility Pattern Suggests Possibility of an Extended Spectrum Beta Lactamase Producer. Contact Laboratory Within 7 Days if Confirmation Warranted. Performed at [facility], [place] [address], [place], [place] [address]    Report Status 04/03/2017 FINAL  Final   Organism ID, Bacteria  PROTEUS MIRABILIS  Final      Susceptibility   Proteus mirabilis - MIC*    AMPICILLIN >=32 RESISTANT Resistant     CEFAZOLIN >=64 RESISTANT Resistant     CEFEPIME RESISTANT Resistant     CEFTAZIDIME RESISTANT Resistant     CEFTRIAXONE >=64 RESISTANT Resistant     CIPROFLOXACIN >=4 RESISTANT Resistant     GENTAMICIN <=1 SENSITIVE Sensitive     IMIPENEM 1 SENSITIVE Sensitive     TRIMETH/SULFA >=320 RESISTANT Resistant     AMPICILLIN/SULBACTAM 8 SENSITIVE Sensitive     PIP/TAZO <=4 SENSITIVE Sensitive     * PROTEUS MIRABILIS  Blood Culture ID Panel (Reflexed)     Status: Abnormal   Collection Time: 03/31/17  6:26 PM  Result Value Ref Range Status   Enterococcus species NOT DETECTED NOT DETECTED Final   Vancomycin resistance NOT DETECTED NOT DETECTED Final   Listeria monocytogenes NOT DETECTED NOT DETECTED Final   Staphylococcus species NOT DETECTED NOT DETECTED Final   Staphylococcus aureus NOT DETECTED NOT DETECTED Final   Methicillin resistance NOT DETECTED NOT DETECTED Final   Streptococcus species NOT DETECTED NOT DETECTED Final   Streptococcus agalactiae NOT DETECTED NOT DETECTED Final   Streptococcus pneumoniae NOT DETECTED NOT DETECTED Final   Streptococcus pyogenes NOT DETECTED NOT DETECTED Final   Acinetobacter baumannii NOT DETECTED NOT DETECTED Final   Enterobacteriaceae species DETECTED (A) NOT DETECTED Final    Comment: CRITICAL RESULT CALLED TO, READ BACK BY AND VERIFIED WITH: PHARMD N GLOGOVAC [id number] MLM    Enterobacter cloacae complex NOT DETECTED NOT DETECTED Final   Escherichia coli NOT DETECTED NOT DETECTED Final   Klebsiella oxytoca NOT DETECTED NOT DETECTED Final   Klebsiella pneumoniae NOT DETECTED NOT DETECTED Final  Proteus species DETECTED (A) NOT DETECTED Final    Comment: CRITICAL RESULT CALLED TO, READ BACK BY AND VERIFIED WITH: PHARMD N GLOGOVAC [id number] MLM    Serratia marcescens NOT DETECTED NOT DETECTED Final   Carbapenem resistance NOT  DETECTED NOT DETECTED Final   Haemophilus influenzae NOT DETECTED NOT DETECTED Final   Neisseria meningitidis NOT DETECTED NOT DETECTED Final   Pseudomonas aeruginosa NOT DETECTED NOT DETECTED Final   Candida albicans NOT DETECTED NOT DETECTED Final   Candida glabrata NOT DETECTED NOT DETECTED Final   Candida krusei NOT DETECTED NOT DETECTED Final   Candida parapsilosis NOT DETECTED NOT DETECTED Final   Candida tropicalis NOT DETECTED NOT DETECTED Final    Comment: Performed at [facility], [address], [place], [place] [address]  Blood culture (routine x 2)     Status: Abnormal   Collection Time: 03/31/17  6:43 PM  Result Value Ref Range Status   Specimen Description BLOOD RIGHT ARM  Final   Special Requests   Final    IN PEDIATRIC BOTTLE Blood Culture results may not be optimal due to an excessive volume of blood received in culture bottles   Culture  Setup Time   Final    GRAM NEGATIVE RODS IN PEDIATRIC BOTTLE CRITICAL VALUE NOTED.  VALUE IS CONSISTENT WITH PREVIOUSLY REPORTED AND CALLED VALUE.    Culture (A)  Final    PROTEUS MIRABILIS SUSCEPTIBILITIES PERFORMED ON PREVIOUS CULTURE WITHIN THE LAST 5 DAYS. Performed at [facility], [place] [address] [place], [place] [address]    Report Status 04/03/2017 FINAL  Final  MRSA PCR Screening     Status: None   Collection Time: 04/01/17  4:32 AM  Result Value Ref Range Status   MRSA by PCR NEGATIVE NEGATIVE Final    Comment:        The GeneXpert MRSA Assay (FDA approved for NASAL specimens only), is one component of a comprehensive MRSA colonization surveillance program. It is not intended to diagnose MRSA infection nor to guide or monitor treatment for MRSA infections.        Radiology Studies: US Renal  Result Date: 04/02/2017 CLINICAL DATA:  Acute kidney injury. Urinary tract infection without hematuria. EXAM: RENAL / URINARY TRACT ULTRASOUND COMPLETE COMPARISON:  CT 01/05/2008 FINDINGS: Right Kidney:  Length: 8.7 cm. There is thinning of the renal parenchyma with increased echogenicity. No renal fluid collection. No mass or hydronephrosis visualized. Left Kidney: Not visualized due to technical factors, patient had significant difficulty with positioning. Bladder: Nondistended and not well evaluated. IMPRESSION: 1. Findings consistent with chronic medical renal disease of the right kidney. No hydronephrosis. 2. Left kidney not visualized due to technical factors, limited patient positioning. 3. Decompressed urinary bladder. Electronically Signed   By: Jeb Levering M.D.   On: 04/02/2017 20:29   Dg Chest Port 1 View  Result Date: 04/04/2017 CLINICAL DATA:  Weakness and congestion. EXAM: PORTABLE CHEST 1 VIEW COMPARISON:  April 02, 2017 FINDINGS: Bilateral pleural effusions and underlying atelectasis. No pneumothorax. Stable cardiomediastinal silhouette. Stable right PICC line. Pulmonary venous congestion without overt edema. IMPRESSION: No significant interval change in the bilateral pleural effusions and underlying atelectasis. Persistent pulmonary venous congestion without overt edema. Electronically Signed   By: Dorise Bullion III M.D   On: 04/04/2017 07:47      Scheduled Meds: . Chlorhexidine Gluconate Cloth  6 each Topical Daily  . collagenase   Topical Daily  . dorzolamide  1 drop Left Eye  BID  . feeding supplement (ENSURE ENLIVE)  237 mL Oral BID BM  . FLUoxetine  40 mg Oral Daily  . furosemide  40 mg Intravenous BID  . latanoprost  1 drop Left Eye QHS  . levothyroxine  75 mcg Oral QAC breakfast  . mirtazapine  7.5 mg Oral QHS  . multivitamin  15 mL Oral Daily  . pantoprazole (PROTONIX) IV  40 mg Intravenous Q24H  . pilocarpine  1 drop Left Eye BID  . rOPINIRole  1 mg Oral QHS  . timolol  1 drop Left Eye BID  . warfarin  0.5 mg Oral ONCE-1800  . Warfarin - Pharmacist Dosing Inpatient   Does not apply q1800   Continuous Infusions: . meropenem (MERREM) IV Stopped (04/04/17  1013)     LOS: 3 days    Time spent: 30 minutes   Dessa Phi, DO [facility] [email or web address] Password [facility] 04/04/2017, 12:09 PM

## 2017-04-05 ENCOUNTER — Ambulatory Visit: Payer: Medicare Other | Admitting: Surgery

## 2017-04-05 LAB — CBC WITH DIFFERENTIAL/PLATELET
BASOS PCT: 0 %
Basophils Absolute: 0 10*3/uL (ref 0.0–0.1)
Eosinophils Absolute: 0.1 10*3/uL (ref 0.0–0.7)
Eosinophils Relative: 1 %
HEMATOCRIT: 30.6 % — AB (ref 36.0–46.0)
HEMOGLOBIN: 10.4 g/dL — AB (ref 12.0–15.0)
LYMPHS ABS: 0.5 10*3/uL — AB (ref 0.7–4.0)
LYMPHS PCT: 5 %
MCH: 30.5 pg (ref 26.0–34.0)
MCHC: 34 g/dL (ref 30.0–36.0)
MCV: 89.7 fL (ref 78.0–100.0)
MONOS PCT: 8 %
Monocytes Absolute: 0.8 10*3/uL (ref 0.1–1.0)
NEUTROS ABS: 9.1 10*3/uL — AB (ref 1.7–7.7)
NEUTROS PCT: 86 %
Platelets: 159 10*3/uL (ref 150–400)
RBC: 3.41 MIL/uL — ABNORMAL LOW (ref 3.87–5.11)
RDW: 17.1 % — ABNORMAL HIGH (ref 11.5–15.5)
WBC: 10.5 10*3/uL (ref 4.0–10.5)

## 2017-04-05 LAB — BASIC METABOLIC PANEL
Anion gap: 9 (ref 5–15)
BUN: 16 mg/dL (ref 6–20)
CHLORIDE: 98 mmol/L — AB (ref 101–111)
CO2: 32 mmol/L (ref 22–32)
CREATININE: 1.03 mg/dL — AB (ref 0.44–1.00)
Calcium: 8.4 mg/dL — ABNORMAL LOW (ref 8.9–10.3)
GFR calc non Af Amer: 47 mL/min — ABNORMAL LOW (ref >60)
GFR, EST AFRICAN AMERICAN: 55 mL/min — AB (ref >60)
Glucose, Bld: 81 mg/dL (ref 65–99)
Potassium: 3.2 mmol/L — ABNORMAL LOW (ref 3.5–5.1)
Sodium: 139 mmol/L (ref 135–145)

## 2017-04-05 LAB — PROCALCITONIN: Procalcitonin: 12.49 ng/mL

## 2017-04-05 LAB — PROTIME-INR
INR: 2.09
Prothrombin Time: 23.8 seconds — ABNORMAL HIGH (ref 11.4–15.2)

## 2017-04-05 MED ORDER — PANTOPRAZOLE SODIUM 40 MG PO TBEC
40.0000 mg | DELAYED_RELEASE_TABLET | Freq: Every day | ORAL | Status: DC
Start: 2017-04-05 — End: 2017-04-09
  Administered 2017-04-05 – 2017-04-09 (×5): 40 mg via ORAL
  Filled 2017-04-05 (×5): qty 1

## 2017-04-05 MED ORDER — WARFARIN 0.5 MG HALF TABLET
0.5000 mg | ORAL_TABLET | Freq: Once | ORAL | Status: AC
  Administered 2017-04-05: 0.5 mg via ORAL
  Filled 2017-04-05: qty 1

## 2017-04-05 MED ORDER — POTASSIUM CHLORIDE CRYS ER 20 MEQ PO TBCR
40.0000 meq | EXTENDED_RELEASE_TABLET | ORAL | Status: AC
  Administered 2017-04-05 (×2): 40 meq via ORAL
  Filled 2017-04-05 (×2): qty 2

## 2017-04-05 NOTE — Progress Notes (Addendum)
PROGRESS NOTE    [name]  YQM:[id number] DOB: 06-29-1927 DOA: 03/31/2017 PCP: Tamsen Roers, MD     Brief Narrative:  [name] is a 83 y.o. female with medical history significant of atrial fib on coumadin, chronic respiratory failure on 2.5-3L at baseline O2, chronic combined systolic and diastolic heart failure, HTN, hypothyroidism, and chronic pressure ulcers. She was evaluated in the ED a week ago for confusion and lethargy, was diagnosed with URI/COPD exacerbation as well as UTI. She was given steroids, doxycyline and keflex and sent home. Since then, she has not gotten any better. She has continued to be lethargic, not acting her normal self, weak, confused, with decreased PO intake. At baseline, she does not ambulate. She is able to assist in transferring to motorized wheelchair.   Overnight on 8/15, she became more short of breath with labored breathing. Chest x-ray was completed which did show pulmonary edema. She was given 20 mg of IV Lasix. During nursing evaluation, patient was seen leaning to the left, left facial droop, more difficult to arouse, unequal pupils. Rapid response was called. Patient underwent CT head stat which was unremarkable for acute etiology. During CT, patient's blood pressure dropped to the 60s. She was given 1 L fluid bolus and transferred to ICU, PCCM was consulted, patient started on vasopressor. Neurology was consulted over the phone, they recommended MRI, MRA brain.   On morning of 8/16, symptoms have resolved. Further questioning of family found that patient has these episodes chronically at home of difficult arousal and has unequal pupils at baseline due to glaucoma, although typically left side is larger than right side. She became verbal with improvement in BP. PCCM canceled MRI/MRA as they did not feel stroke work up was necessary.   Blood cultures returned with proteus and rocephin was increased to 2g IV. Sensitivity returned ESBL and  antibiotics changed to merrem.   Assessment & Plan:   Principal Problem:   Acute metabolic encephalopathy Active Problems:   UTI (urinary tract infection)   Pressure injury of skin   Protein-calorie malnutrition, severe   Labored breathing  Acute toxic encephalopathy -Treat infection as below -Resolved, verbal and oriented this morning     Septic shock secondary to ESBL proteus UTI (POA) and ESBL proteus bacteremia  -Failure of outpatient treatment with Keflex -Urine culture with ESBL proteus mirabilis  -Blood culture with ESBL proteus mirabilis  -Continue merrem. Spoke with Dr. Megan Salon (ID) this morning. As long as she is improving and responding to treatment, he recommends 7 days of IV merrem. PICC needs to be replaced today due to nonfunctioning overnight.  -Off vasopressor. Cortisol 39.2. BP stable today.   Acute neurologic change 8/15-8/16 -Left facial droop, unequal pupils (per daughter, patient's pupils are unequal at baseline with L>R due to glaucoma. However on my examination, pupils are R>L which is the opposite of her baseline pupil difference)  -CT head without acute etiology -Overnight team 8/15 spoke with neurology on call who recommended MRI, MRA to r/o stroke, but has since been canceled by [facility] team as mentation and neurologic change improved and back to baseline. Per family, patient does not tolerate MRI well so we can hold off.  -EEG completed which showed diffuse cerebral dysfunction that is non-specific in etiology and can be seen with hypoxic/ischemic injury, toxic/metabolic encephalopathies, neurodegenerative disorders, or medication effect -Resolved and back to baseline   Acute on chronic systolic and diastolic heart failure -EF 30-35% -Follows with Dr. Einar Gip -Spoke with Dr.  Ganji over the phone 8/16. He recommends lasix 40mg  IV BID and continue to monitor. However due to hypotension, lasix could not be given. She was given IVF for BP support  -CXR 8/20  without significant interval change in bilateral pleural effusions, persistent pulmonary venous congestion  -Will continue 40mg  IV BID due to continued bibasilar crackles. Stable and improved breathing.   AKI -Baseline Cr 1.1. Due to combination of hypotension, infection, lasix  -Continue to treat infection as above  -Monitor UOP  -Renal US unremarkable, limited exam due to positioning -Improved and back to baseline   Chronic atrial fibrillation -Coumadin per pharmacy    Chronic respiratory failure -2.5-3L [place] O2 at baseline   Chronic pressure ulcers on her coccyx, bilateral heels, right lateral malleoli -Does not appear acutely infected -Turn every 2 hours -Wound RN consult  Hypertension -Hold antihypertensives due to low BP. Monitor BP.   Hypothyroidism -Continue Synthroid  Hematuria -In setting of UTI and coumadin use. Resolved.   Goals of care -Full code in this 83 yo female with multiple medical problems, septic shock, respiratory failure, and nonambulatory at baseline with multiple pressure wounds. Discussed with daughter about goals of care. Patient had been DNR at one point in the past but currently remains full code. Recommend palliative care consult.  -Appreciate palliative care medicine. Remains full code.    DVT prophylaxis: coumadin Code Status: full, confirmed with daughter Family Communication: spoke with daughter over phone morning 8/20  Disposition Plan: pending improvement, stabilization. Possibly home 8/21 or 8/22 with HHRN and PICC, IV antibiotics    Consultants:   PCCM  Neurology over the phone 8/15   Cardiology over the phone 8/16  Palliative care   Procedures:   None  Antimicrobials:  Anti-infectives    Start     Dose/Rate Route Frequency Ordered Stop   04/03/17 1000  meropenem (MERREM) 1 g in sodium chloride 0.9 % 100 mL IVPB     1 g 200 mL/hr over 30 Minutes Intravenous Every 12 hours 04/03/17 0948     04/01/17 1800   cefTRIAXone (ROCEPHIN) 1 g in dextrose 5 % 50 mL IVPB  Status:  Discontinued     1 g 100 mL/hr over 30 Minutes Intravenous Every 24 hours 03/31/17 1950 04/01/17 1353   04/01/17 1400  cefTRIAXone (ROCEPHIN) 2 g in dextrose 5 % 50 mL IVPB  Status:  Discontinued     2 g 100 mL/hr over 30 Minutes Intravenous Every 24 hours 04/01/17 1353 04/03/17 0948   03/31/17 1630  cefTRIAXone (ROCEPHIN) 1 g in dextrose 5 % 50 mL IVPB     1 g 100 mL/hr over 30 Minutes Intravenous  Once 03/31/17 1617 03/31/17 2038       Subjective: Patient alert and awake this morning. No complaints, states she doesn't feel well but cannot characterize complaint. Denies chest pain, abdominal pain. States she is cold.    Objective: Vitals:   04/04/17 0800 04/04/17 1153 04/04/17 2100 04/05/17 0554  BP:  121/80 (!) 98/53 131/77  Pulse:  85 98 80  Resp:  16 18 18   Temp: 97.6 F (36.4 C) 97.9 F (36.6 C) (!) 97.3 F (36.3 C) 97.7 F (36.5 C)  TempSrc: Oral Axillary Oral Oral  SpO2:  100% 100% 100%  Weight:  60.3 kg (132 lb 15 oz)    Height:  5\' 9"  (1.753 m)      Intake/Output Summary (Last 24 hours) at 04/05/17 0853 Last data filed at 04/05/17 0200  Gross  per 24 hour  Intake              437 ml  Output             1900 ml  Net            -1463 ml   Filed Weights   04/03/17 0500 04/04/17 0429 04/04/17 1153  Weight: 69.6 kg (153 lb 7 oz) 63.5 kg (139 lb 15.9 oz) 60.3 kg (132 lb 15 oz)    Examination:  General exam: Appears comfortable, resting in bed Respiratory system: Bilateral crackles. Respiratory effort better today, RR 20s, appears comfortable  Cardiovascular system: S1 & S2 heard, irreg rhythm. No JVD, murmurs, rubs, gallops or clicks. No pedal edema. Gastrointestinal system: Abdomen is nondistended, soft and nontender. No organomegaly or masses felt. Normal bowel sounds heard. Central nervous system: Alert, moves all extremities spontaneously, nonfocal, pupils remain unequal R>L  Extremities:  Symmetric in appearance  Skin: +bilateral heel ulcers, +right lateral malleolar ulcer with screw visible. No acute infection.   Data Reviewed: I have personally reviewed following labs and imaging studies  CBC:  Recent Labs Lab 03/31/17 1524 04/01/17 0200 04/02/17 0300 04/03/17 0337 04/04/17 0400 04/05/17 0724  WBC 13.1* 20.8* 22.5* 18.4* 15.0* 10.5  NEUTROABS 11.0* 18.9*  --  17.2* 13.6* 9.1*  HGB 10.8* 8.9* 8.9* 8.9* 8.6* 10.4*  HCT 32.2* 26.4* 26.5* 26.1* 25.4* 30.6*  MCV 91.7 92.6 92.7 91.3 90.4 89.7  PLT 223 177 180 98* 140* 790   Basic Metabolic Panel:  Recent Labs Lab 04/01/17 0702 04/02/17 0300 04/03/17 0337 04/04/17 0400 04/05/17 0724  NA 139 139 139 141 139  K 3.8 3.6 3.3* 3.5 3.2*  CL 106 108 107 107 98*  CO2 25 21* 22 28 32  GLUCOSE 90 78 110* 102* 81  BUN 21* 23* 26* 22* 16  CREATININE 1.33* 1.45* 1.32* 1.16* 1.03*  CALCIUM 8.1* 8.0* 8.2* 8.2* 8.4*  MG  --  1.6* 2.3  --   --    GFR: Estimated Creatinine Clearance: 35.9 mL/min (A) (by C-G formula based on SCr of 1.03 mg/dL (H)). Liver Function Tests:  Recent Labs Lab 03/31/17 1524 04/01/17 0702  AST 23 24  ALT 22 22  ALKPHOS 70 81  BILITOT 0.7 0.6  PROT 5.8* 5.7*  ALBUMIN 2.7* 2.7*   No results for input(s): LIPASE, AMYLASE in the last 168 hours. No results for input(s): AMMONIA in the last 168 hours. Coagulation Profile:  Recent Labs Lab 04/01/17 0200 04/02/17 0300 04/03/17 0337 04/04/17 0400 04/05/17 0724  INR 3.00 4.94* 4.35* 2.89 2.09   Cardiac Enzymes:  Recent Labs Lab 04/01/17 0214  TROPONINI <0.03   BNP (last 3 results) No results for input(s): PROBNP in the last 8760 hours. HbA1C: No results for input(s): HGBA1C in the last 72 hours. CBG:  Recent Labs Lab 04/01/17 0100  GLUCAP 83   Lipid Profile: No results for input(s): CHOL, HDL, LDLCALC, TRIG, CHOLHDL, LDLDIRECT in the last 72 hours. Thyroid Function Tests: No results for input(s): TSH, T4TOTAL, FREET4,  T3FREE, THYROIDAB in the last 72 hours. Anemia Panel: No results for input(s): VITAMINB12, FOLATE, FERRITIN, TIBC, IRON, RETICCTPCT in the last 72 hours. Sepsis Labs:  Recent Labs Lab 04/01/17 0214 04/01/17 0305 04/01/17 0700 04/03/17 0337  PROCALCITON 8.53  --   --  43.47  LATICACIDVEN  --  2.1* 1.8  --     Recent Results (from the past 240 hour(s))  Urine culture  Status: Abnormal   Collection Time: 03/31/17  2:49 PM  Result Value Ref Range Status   Specimen Description URINE, CLEAN CATCH  Final   Special Requests NONE  Final   Culture (A)  Final    >=100,000 COLONIES/mL PROTEUS MIRABILIS Susceptibility Pattern Suggests Possibility of an Extended Spectrum Beta Lactamase Producer. Contact Laboratory Within 7 Days if Confirmation Warranted. Performed at [facility], [place] [address], [place], [place] [address]    Report Status 04/03/2017 FINAL  Final   Organism ID, Bacteria PROTEUS MIRABILIS (A)  Final      Susceptibility   Proteus mirabilis - MIC*    AMPICILLIN >=32 RESISTANT Resistant     CEFAZOLIN >=64 RESISTANT Resistant     CEFTRIAXONE >=64 RESISTANT Resistant     CIPROFLOXACIN >=4 RESISTANT Resistant     GENTAMICIN <=1 SENSITIVE Sensitive     IMIPENEM 2 SENSITIVE Sensitive     NITROFURANTOIN 128 RESISTANT Resistant     TRIMETH/SULFA >=320 RESISTANT Resistant     AMPICILLIN/SULBACTAM 8 SENSITIVE Sensitive     PIP/TAZO <=4 SENSITIVE Sensitive     * >=100,000 COLONIES/mL PROTEUS MIRABILIS  Blood culture (routine x 2)     Status: Abnormal   Collection Time: 03/31/17  6:26 PM  Result Value Ref Range Status   Specimen Description BLOOD LEFT ANTECUBITAL  Final   Special Requests   Final    IN PEDIATRIC BOTTLE Blood Culture results may not be optimal due to an excessive volume of blood received in culture bottles   Culture  Setup Time   Final    GRAM NEGATIVE RODS IN PEDIATRIC BOTTLE CRITICAL RESULT CALLED TO, READ BACK BY AND VERIFIED WITH: PHARMD N [place]  [id number] MLM    Culture (A)  Final    PROTEUS MIRABILIS Susceptibility Pattern Suggests Possibility of an Extended Spectrum Beta Lactamase Producer. Contact Laboratory Within 7 Days if Confirmation Warranted. Performed at [facility], [place] [address], [place], [place] [address]    Report Status 04/03/2017 FINAL  Final   Organism ID, Bacteria PROTEUS MIRABILIS  Final      Susceptibility   Proteus mirabilis - MIC*    AMPICILLIN >=32 RESISTANT Resistant     CEFAZOLIN >=64 RESISTANT Resistant     CEFEPIME RESISTANT Resistant     CEFTAZIDIME RESISTANT Resistant     CEFTRIAXONE >=64 RESISTANT Resistant     CIPROFLOXACIN >=4 RESISTANT Resistant     GENTAMICIN <=1 SENSITIVE Sensitive     IMIPENEM 1 SENSITIVE Sensitive     TRIMETH/SULFA >=320 RESISTANT Resistant     AMPICILLIN/SULBACTAM 8 SENSITIVE Sensitive     PIP/TAZO <=4 SENSITIVE Sensitive     * PROTEUS MIRABILIS  Blood Culture ID Panel (Reflexed)     Status: Abnormal   Collection Time: 03/31/17  6:26 PM  Result Value Ref Range Status   Enterococcus species NOT DETECTED NOT DETECTED Final   Vancomycin resistance NOT DETECTED NOT DETECTED Final   Listeria monocytogenes NOT DETECTED NOT DETECTED Final   Staphylococcus species NOT DETECTED NOT DETECTED Final   Staphylococcus aureus NOT DETECTED NOT DETECTED Final   Methicillin resistance NOT DETECTED NOT DETECTED Final   Streptococcus species NOT DETECTED NOT DETECTED Final   Streptococcus agalactiae NOT DETECTED NOT DETECTED Final   Streptococcus pneumoniae NOT DETECTED NOT DETECTED Final   Streptococcus pyogenes NOT DETECTED NOT DETECTED Final   Acinetobacter baumannii NOT DETECTED NOT DETECTED Final   Enterobacteriaceae species DETECTED (A) NOT DETECTED Final  Comment: CRITICAL RESULT CALLED TO, READ BACK BY AND VERIFIED WITH: PHARMD N [place] [phone] MLM    Enterobacter cloacae complex NOT DETECTED NOT DETECTED Final   Escherichia coli NOT DETECTED NOT DETECTED  Final   Klebsiella oxytoca NOT DETECTED NOT DETECTED Final   Klebsiella pneumoniae NOT DETECTED NOT DETECTED Final   Proteus species DETECTED (A) NOT DETECTED Final    Comment: CRITICAL RESULT CALLED TO, READ BACK BY AND VERIFIED WITH: PHARMD N GLOGOVAC [phone] MLM    Serratia marcescens NOT DETECTED NOT DETECTED Final   Carbapenem resistance NOT DETECTED NOT DETECTED Final   Haemophilus influenzae NOT DETECTED NOT DETECTED Final   Neisseria meningitidis NOT DETECTED NOT DETECTED Final   Pseudomonas aeruginosa NOT DETECTED NOT DETECTED Final   Candida albicans NOT DETECTED NOT DETECTED Final   Candida glabrata NOT DETECTED NOT DETECTED Final   Candida krusei NOT DETECTED NOT DETECTED Final   Candida parapsilosis NOT DETECTED NOT DETECTED Final   Candida tropicalis NOT DETECTED NOT DETECTED Final    Comment: Performed at [facility], [place] [address] [place], [place] [address]  Blood culture (routine x 2)     Status: Abnormal   Collection Time: 03/31/17  6:43 PM  Result Value Ref Range Status   Specimen Description BLOOD RIGHT ARM  Final   Special Requests   Final    IN PEDIATRIC BOTTLE Blood Culture results may not be optimal due to an excessive volume of blood received in culture bottles   Culture  Setup Time   Final    GRAM NEGATIVE RODS IN PEDIATRIC BOTTLE CRITICAL VALUE NOTED.  VALUE IS CONSISTENT WITH PREVIOUSLY REPORTED AND CALLED VALUE.    Culture (A)  Final    PROTEUS MIRABILIS SUSCEPTIBILITIES PERFORMED ON PREVIOUS CULTURE WITHIN THE LAST 5 DAYS. Performed at [facility], [place] [address] [place],  [address]    Report Status 04/03/2017 FINAL  Final  MRSA PCR Screening     Status: None   Collection Time: 04/01/17  4:32 AM  Result Value Ref Range Status   MRSA by PCR NEGATIVE NEGATIVE Final    Comment:        The GeneXpert MRSA Assay (FDA approved for NASAL specimens only), is one component of a comprehensive MRSA colonization surveillance  program. It is not intended to diagnose MRSA infection nor to guide or monitor treatment for MRSA infections.        Radiology Studies: Dg Chest Port 1 View  Result Date: 04/04/2017 CLINICAL DATA:  Weakness and congestion. EXAM: PORTABLE CHEST 1 VIEW COMPARISON:  April 02, 2017 FINDINGS: Bilateral pleural effusions and underlying atelectasis. No pneumothorax. Stable cardiomediastinal silhouette. Stable right PICC line. Pulmonary venous congestion without overt edema. IMPRESSION: No significant interval change in the bilateral pleural effusions and underlying atelectasis. Persistent pulmonary venous congestion without overt edema. Electronically Signed   By: Dorise Bullion III M.D   On: 04/04/2017 07:47      Scheduled Meds: . alteplase  2 mg Intracatheter Once  . alteplase  2 mg Intracatheter Once  . Chlorhexidine Gluconate Cloth  6 each Topical Daily  . collagenase   Topical Daily  . dorzolamide  1 drop Left Eye BID  . feeding supplement (ENSURE ENLIVE)  237 mL Oral BID BM  . FLUoxetine  40 mg Oral Daily  . furosemide  40 mg Intravenous BID  . latanoprost  1 drop Left Eye QHS  . levothyroxine  75 mcg Oral QAC breakfast  .  mirtazapine  7.5 mg Oral QHS  . multivitamin  15 mL Oral Daily  . pantoprazole (PROTONIX) IV  40 mg Intravenous Q24H  . pilocarpine  1 drop Left Eye BID  . potassium chloride  40 mEq Oral Q4H  . rOPINIRole  1 mg Oral QHS  . timolol  1 drop Left Eye BID  . Warfarin - Pharmacist Dosing Inpatient   Does not apply q1800   Continuous Infusions: . meropenem (MERREM) IV Stopped (04/04/17 2230)     LOS: 4 days    Time spent: 30 minutes   Dessa Phi, DO [facility] [email or web address] Password [facility] 04/05/2017, 8:53 AM

## 2017-04-05 NOTE — Progress Notes (Addendum)
Nutrition Follow-up  DOCUMENTATION CODES:   Severe malnutrition in context of chronic illness  INTERVENTION:   Ensure Enlive po BID, each supplement provides 350 kcal and 20 grams of protein  Carnation Instant Breakfast BID with meals, each supplement provides 290 kcal and 9 grams of protein  Recommend long term feeding tube if warranted.   NUTRITION DIAGNOSIS:   Malnutrition related to  (COPD, CHF, advanced age ) as evidenced by severe depletion of body fat, severe depletion of muscle mass, energy intake < or equal to 75% for > or equal to 1 month.  Ongoing  GOAL:   Patient will meet greater than or equal to 90% of their needs  Not Meeting  MONITOR:   Diet advancement, Labs, Weight trends, I & O's  REASON FOR ASSESSMENT:   Malnutrition Screening Tool    ASSESSMENT:   83 y.o. female with medical history significant of atrial fib on coumadin, chronic respiratory failure on 2.5-3L at baseline O2, chronic combined systolic and diastolic heart failure, HTN, hypothyroidism, and chronic pressure ulcers. She was evaluated in the ED a week ago for confusion and lethargy, was diagnosed with URI/COPD exacerbation as well as UTI. She was given steroids, doxycyline and keflex and sent home. Since then, she has not gotten any better. She has continued to be lethargic, not acting her normal self, weak, confused, with decreased PO intake. No reported fevers, but admits to chills, no chest pain, nausea, vomiting. She has chronic diarrhea. Developed Rt sided facial droop and dysarthria for which had STAT ct of the head that was low suspicious for stroke. Patient found to be hypotensive, transferred to the ICU and started on phenylephrine  Palliative established goals of care with family. Pt will remain full code at this time with aggressive measures taken. Poor PO intake was discussed in meeting, to which family wishes to have feeding tube if warranted. With feeding deficits (SLP notes  esophageal dysphagia) and decreased mental status pt has not had appropriate amount of nutrition since admission. If tube feeding desired by family, would recommend long term feeding tube placement as swallowing difficulties could take long to resolve if they resolve.   Pt confused upon follow up, looks up when name is called. Reports not having an appetite, but could not elaborate further. Breakfast tray shown to have bites taken from the magic cup and eggs. Spoke with the nurse tech, who reports pt has consumed only bites at each meal. Wt up 1 lb since admission. Will continue to provide supplementation and monitor for any status changes.   Medications reviewed and include: lasix, KDur Labs reviewed: K 3.2 (L) Cl 98 (L) Creatinine 1.03 (H) Albumin 2.7 (L)   Diet Order:  DIET DYS 2 Room service appropriate? Yes; Fluid consistency: Thin  Skin:  Wound (see comment) (Stage II sacrum, bilateral heel wound)  Last BM:  04/04/17  Height:   Ht Readings from Last 1 Encounters:  04/04/17 5\' 9"  (1.753 m)    Weight:   Wt Readings from Last 1 Encounters:  04/04/17 132 lb 15 oz (60.3 kg)    Ideal Body Weight:  61.4 kg  BMI:  Body mass index is 19.63 kg/m.  Estimated Nutritional Needs:   Kcal:  1700-2000kcal/day   Protein:  91-104g/day   Fluid:  >1.7L/day   EDUCATION NEEDS:   Education needs no appropriate at this time  [place], LDN Clinical Nutrition Pager # - [phone]

## 2017-04-05 NOTE — Progress Notes (Signed)
PHARMACY CONSULT NOTE FOR:  OUTPATIENT  PARENTERAL ANTIBIOTIC THERAPY (OPAT)  Indication: Proteus mirabilis bacteremia, likely ESBL producer Regimen: Meropenem 1g IV q12h End date: complete doses through 04/09/17  IV antibiotic discharge orders are pended. To discharging provider:  please sign these orders via discharge navigator,  Select New Orders & click on the button choice - Manage This Unsigned Work.     Thank you for allowing pharmacy to be a part of this patient's care.  Hershal Coria 04/05/2017, 9:35 AM

## 2017-04-05 NOTE — Progress Notes (Addendum)
ANTICOAGULATION CONSULT NOTE - Follow Up Consult  Pharmacy Consult for Warfarin Indication: atrial fibrillation  No Known Allergies   Vital Signs: Temp: 97.7 F (36.5 C) (08/20 0554) Temp Source: Oral (08/20 0554) BP: 131/77 (08/20 0554) Pulse Rate: 80 (08/20 0554)  Labs:  Recent Labs  04/03/17 0337 04/04/17 0400 04/05/17 0724  HGB 8.9* 8.6* 10.4*  HCT 26.1* 25.4* 30.6*  PLT 98* 140* 159  LABPROT 42.8* 30.9* 23.8*  INR 4.35* 2.89 2.09  CREATININE 1.32* 1.16* 1.03*    Estimated Creatinine Clearance: 35.9 mL/min (A) (by C-G formula based on SCr of 1.03 mg/dL (H)).   Medical History: Past Medical History:  Diagnosis Date  . Atrial fibrillation (HCC)    chronic Coumadin.   Marland Kitchen COPD (chronic obstructive pulmonary disease) ([place])   . Glaucoma   . Hypothyroidism   . Neuropathy    PERIPHERAL  . Osteoarthritis   . Osteoporosis   . Trimalleolar fracture    LEFT TRIMALLEOLAR FRACTURE, DISLOCATION WITH OBLIQUE FRACTURE OF THE DISTAL FIBULAR DIAPHYSIS, AND NOTED BEING MILDLY COMMINUTED     Assessment: 77 y/oF with PMH of atrial fibrillation on warfarin, chronic respiratory failure, combined systolic and diastolic heart failure, HTN, hypothyroidism, chronic pressure ulcers who was seen in ED 03/21/2017 and diagnosed with URI/COPD, UTI-sent home with Doxycycline and Cephalexin. Patient [facility] to [facility] ED today, 03/31/2017, with increased lethargy, weakness, confusion, decreased PO intake, intermittent lower abdominal/pelvic discomfort, and hematuria. UA c/w UTI. Pharmacy consulted by [facility] to assist with dosing of warfarin while patient in the hospital. INR on admission 3. CBC reveals Hgb 10.8 (stable as compared to previous values), Pltc WNL. Home dose of warfarin reported as 1mg  PO daily, with last dose taken 03/30/2017 at 1800.   Today, 04/05/2017  INR decreased into therapeutic range - 2.89, warfarin was held 8/16-8/18 for supratherapeutic INR, resumed with low dose yesterday  Hgb  and platelets both improved  Hematuria resolved  Dysphagia diet + Ensure ordered  Goal of Therapy:  INR 2-3   Plan:   Repeat warfarin 0.5 mg tonight. Not much PO intake, on broad spectrum antibiotics which can result in increased sensitivity to warfarin.  Cautious dosing due recent supratherapeutic INR.  Daily PT/INR.  Monitor CBC and for worsening hematuria/bleeding.    In regards to IV Protonix and IV-to-PO approved policy: Patient has not experienced any GI bleeding, has no impaired GI absorption and patient is taking PO meds. Based on criteria approved by the Pharmacy and Therapeutics Committee, will convert IV Protonix to the equivalent oral dose form today.  Hershal Coria, PharmD, BCPS Pager: [phone] 04/05/2017 9:41 AM

## 2017-04-05 NOTE — Progress Notes (Signed)
Notified IV team of PICC line exchange/replacement.

## 2017-04-06 ENCOUNTER — Inpatient Hospital Stay (HOSPITAL_COMMUNITY): Payer: Medicare Other

## 2017-04-06 LAB — CBC WITH DIFFERENTIAL/PLATELET
BASOS ABS: 0 10*3/uL (ref 0.0–0.1)
BASOS PCT: 0 %
EOS ABS: 0.1 10*3/uL (ref 0.0–0.7)
Eosinophils Relative: 1 %
HCT: 30.4 % — ABNORMAL LOW (ref 36.0–46.0)
HEMOGLOBIN: 9.7 g/dL — AB (ref 12.0–15.0)
LYMPHS ABS: 0.7 10*3/uL (ref 0.7–4.0)
Lymphocytes Relative: 7 %
MCH: 29.3 pg (ref 26.0–34.0)
MCHC: 31.9 g/dL (ref 30.0–36.0)
MCV: 91.8 fL (ref 78.0–100.0)
Monocytes Absolute: 0.7 10*3/uL (ref 0.1–1.0)
Monocytes Relative: 7 %
NEUTROS PCT: 85 %
Neutro Abs: 8 10*3/uL — ABNORMAL HIGH (ref 1.7–7.7)
Platelets: 182 10*3/uL (ref 150–400)
RBC: 3.31 MIL/uL — AB (ref 3.87–5.11)
RDW: 16.9 % — ABNORMAL HIGH (ref 11.5–15.5)
WBC: 9.5 10*3/uL (ref 4.0–10.5)

## 2017-04-06 LAB — BASIC METABOLIC PANEL
ANION GAP: 8 (ref 5–15)
BUN: 18 mg/dL (ref 6–20)
CALCIUM: 8.8 mg/dL — AB (ref 8.9–10.3)
CHLORIDE: 97 mmol/L — AB (ref 101–111)
CO2: 34 mmol/L — AB (ref 22–32)
CREATININE: 0.98 mg/dL (ref 0.44–1.00)
GFR calc Af Amer: 58 mL/min — ABNORMAL LOW (ref >60)
GFR calc non Af Amer: 50 mL/min — ABNORMAL LOW (ref >60)
GLUCOSE: 121 mg/dL — AB (ref 65–99)
Potassium: 3.6 mmol/L (ref 3.5–5.1)
SODIUM: 139 mmol/L (ref 135–145)

## 2017-04-06 LAB — PROTIME-INR
INR: 2.06
Prothrombin Time: 23.6 seconds — ABNORMAL HIGH (ref 11.4–15.2)

## 2017-04-06 MED ORDER — WARFARIN 0.5 MG HALF TABLET
0.5000 mg | ORAL_TABLET | Freq: Once | ORAL | Status: AC
  Administered 2017-04-06: 0.5 mg via ORAL
  Filled 2017-04-06: qty 1

## 2017-04-06 MED ORDER — GABAPENTIN 300 MG PO CAPS
600.0000 mg | ORAL_CAPSULE | Freq: Every day | ORAL | Status: DC
  Administered 2017-04-06: 600 mg via ORAL
  Filled 2017-04-06: qty 2

## 2017-04-06 MED ORDER — MEROPENEM IV (FOR PTA / DISCHARGE USE ONLY): 1.0000 g | Freq: Two times a day (BID) | INTRAVENOUS | 0 refills | Status: DC

## 2017-04-06 MED ORDER — FUROSEMIDE 20 MG PO TABS
20.0000 mg | ORAL_TABLET | Freq: Two times a day (BID) | ORAL | Status: DC
  Administered 2017-04-06 – 2017-04-07 (×3): 20 mg via ORAL
  Filled 2017-04-06 (×3): qty 1

## 2017-04-06 NOTE — Progress Notes (Addendum)
Spoke with pt's daughter Baker Janus [phone] concerning disposition. Baker Janus stated that family had planned for pt to come home, if not then [place]. Pt have DME at home shower chair, lift chair, motorize chair and use [facility]. [facility] was called, HHRN will be able to administer IV ABX, but will need to get medications from another company. IV ABX from [facility]. Will need HH orders HHRN to care for PICC, teaching of IV care, wound care, and IV ABX dose, frequency and length of time pt will be on IV ABX. [facility] and [facility] aware, waiting on orders.

## 2017-04-06 NOTE — Progress Notes (Signed)
ANTICOAGULATION CONSULT NOTE - Follow Up Consult  Pharmacy Consult for Warfarin Indication: atrial fibrillation  No Known Allergies   Vital Signs: Temp: 97.9 F (36.6 C) (08/21 0630) Temp Source: Oral (08/21 0630) BP: 102/56 (08/21 0630) Pulse Rate: 87 (08/21 0630)  Labs:  Recent Labs  04/04/17 0400 04/05/17 0724 04/06/17 1147  HGB 8.6* 10.4* 9.7*  HCT 25.4* 30.6* 30.4*  PLT 140* 159 182  LABPROT 30.9* 23.8* 23.6*  INR 2.89 2.09 2.06  CREATININE 1.16* 1.03* 0.98    Estimated Creatinine Clearance: 37.8 mL/min (by C-G formula based on SCr of 0.98 mg/dL).   Medical History: Past Medical History:  Diagnosis Date  . Atrial fibrillation (HCC)    chronic Coumadin.   Marland Kitchen COPD (chronic obstructive pulmonary disease) ([place])   . Glaucoma   . Hypothyroidism   . Neuropathy    PERIPHERAL  . Osteoarthritis   . Osteoporosis   . Trimalleolar fracture    LEFT TRIMALLEOLAR FRACTURE, DISLOCATION WITH OBLIQUE FRACTURE OF THE DISTAL FIBULAR DIAPHYSIS, AND NOTED BEING MILDLY COMMINUTED     Assessment: 40 y/oF with PMH of atrial fibrillation on warfarin, chronic respiratory failure, combined systolic and diastolic heart failure, HTN, hypothyroidism, chronic pressure ulcers who was seen in ED 03/21/2017 and diagnosed with URI/COPD, UTI-sent home with Doxycycline and Cephalexin. Patient presented to [facility] ED today, 03/31/2017, with increased lethargy, weakness, confusion, decreased PO intake, intermittent lower abdominal/pelvic discomfort, and hematuria. UA c/w UTI. Pharmacy consulted by [facility] to assist with dosing of warfarin while patient in the hospital. INR on admission 3. CBC reveals Hgb 10.8 (stable as compared to previous values), Pltc WNL. Home dose of warfarin reported as 1mg  PO daily, with last dose taken 03/30/2017 at 1800.   Today, 04/06/2017  INR 2.06, warfarin was held 8/16-8/18 for supratherapeutic INR, resumed with low dose two nights ago  Hgb low, stable and platelets  WNL  Hematuria resolved  Dysphagia diet + Ensure ordered  Goal of Therapy:  INR 2-3   Plan:   Repeat warfarin 0.5 mg tonight. Not much PO intake, on broad spectrum antibiotics which can result in increased sensitivity to warfarin.  Cautious dosing due recent supratherapeutic INR.  Daily PT/INR.   Hershal Coria, PharmD, BCPS Pager: [phone] 04/06/2017 1:11 PM

## 2017-04-06 NOTE — Progress Notes (Signed)
Spoke with radiologist Dr. Ardeen Garland regarding PICC placement on CXR.   Discussed how far the PICC needed to be retracted in order to be correctly placed.  He suggested it be retracted 1-2 cm.

## 2017-04-06 NOTE — Progress Notes (Signed)
PROGRESS NOTE    [name]  MAU:[id number] DOB: 1927-08-03 DOA: 03/31/2017 PCP: Tamsen Roers, MD     Brief Narrative:  [name] is a 83 y.o. female with medical history significant of atrial fib on coumadin, chronic respiratory failure on 2.5-3L at baseline O2, chronic combined systolic and diastolic heart failure, HTN, hypothyroidism, and chronic pressure ulcers. She was evaluated in the ED a week ago for confusion and lethargy, was diagnosed with URI/COPD exacerbation as well as UTI. She was given steroids, doxycyline and keflex and sent home. Since then, she has not gotten any better. She has continued to be lethargic, not acting her normal self, weak, confused, with decreased PO intake. At baseline, she does not ambulate. She is able to assist in transferring to motorized wheelchair.   Overnight on 8/15, she became more short of breath with labored breathing. Chest x-ray was completed which did show pulmonary edema. She was given 20 mg of IV Lasix. During nursing evaluation, patient was seen leaning to the left, left facial droop, more difficult to arouse, unequal pupils. Rapid response was called. Patient underwent CT head stat which was unremarkable for acute etiology. During CT, patient's blood pressure dropped to the 60s. She was given 1 L fluid bolus and transferred to ICU, PCCM was consulted, patient started on vasopressor. Neurology was consulted over the phone, they recommended MRI, MRA brain.   On morning of 8/16, symptoms have resolved. Further questioning of family found that patient has these episodes chronically at home of difficult arousal and has unequal pupils at baseline due to glaucoma, although typically left side is larger than right side. She became verbal with improvement in BP. PCCM canceled MRI/MRA as they did not feel stroke work up was necessary.   Blood cultures returned with proteus and rocephin was increased to 2g IV. Sensitivity returned ESBL and  antibiotics changed to merrem.   Assessment & Plan:   Principal Problem:   Acute metabolic encephalopathy Active Problems:   UTI (urinary tract infection)   Pressure injury of skin   Protein-calorie malnutrition, severe   Labored breathing  Acute toxic encephalopathy -Treat infection as below -Resolved, verbal and oriented this morning  Septic shock secondary to ESBL proteus UTI (POA) and ESBL proteus bacteremia  -Failure of outpatient treatment with Keflex -Urine culture with ESBL proteus mirabilis  -Blood culture with ESBL proteus mirabilis  -Continue merrem. Spoke with Dr. Megan Salon (ID) this morning. As long as she is improving and responding to treatment, he recommends 7 days of IV merrem. PICC needs to be replaced today due to nonfunctioning overnight.  -Off vasopressor. Cortisol 39.2. BP stable today.   Acute neurologic change 8/15-8/16 -Left facial droop, unequal pupils (per daughter, patient's pupils are unequal at baseline with L>R due to glaucoma. However on my examination, pupils are R>L which is the opposite of her baseline pupil difference)  -CT head without acute etiology -Overnight team 8/15 spoke with neurology on call who recommended MRI, MRA to r/o stroke, but has since been canceled by [facility] team as mentation and neurologic change improved and back to baseline. Per family, patient does not tolerate MRI well so we can hold off.  -EEG completed which showed diffuse cerebral dysfunction that is non-specific in etiology and can be seen with hypoxic/ischemic injury, toxic/metabolic encephalopathies, neurodegenerative disorders, or medication effect -Resolved and back to baseline   Acute on chronic systolic and diastolic heart failure -EF 30-35% -Follows with Dr. Einar Gip -Spoke with Dr. Einar Gip over the  phone 8/16. He recommends lasix 40mg  IV BID and continue to monitor. However due to hypotension, lasix could not be given. She was given IVF for BP support  -CXR 8/20  without significant interval change in bilateral pleural effusions, persistent pulmonary venous congestion  -Transition to oral lasix today   AKI -Baseline Cr 1.1. Due to combination of hypotension, infection, lasix  -Renal US unremarkable, limited exam due to positioning -Continue to treat infection as above  -Monitor UOP  -Improved and back to baseline   Chronic atrial fibrillation -Coumadin per pharmacy    Chronic respiratory failure -2.5-3L [place] O2 at baseline   Chronic pressure ulcers on her coccyx, bilateral heels, right lateral malleoli -Does not appear acutely infected -Turn every 2 hours -Wound RN consult  Hypertension -Hold antihypertensives due to low BP. Monitor BP.   Hypothyroidism -Continue Synthroid  Hematuria -In setting of UTI and coumadin use. Resolved.   Goals of care -Full code in this 83 yo female with multiple medical problems, septic shock, respiratory failure, and nonambulatory at baseline with multiple pressure wounds. Discussed with daughter about goals of care. Patient had been DNR at one point in the past but currently remains full code. Recommend palliative care consult.  -Appreciate palliative care medicine. Remains full code.    DVT prophylaxis: coumadin Code Status: full, confirmed with daughter Family Communication: spoke with daughter over phone morning 8/21 Disposition Plan: pending improvement, stabilization. Possibly home 8/22 with [facility] and PICC, IV antibiotics    Consultants:   PCCM  Neurology over the phone 8/15   Cardiology over the phone 8/16  Palliative care   Procedures:   None  Antimicrobials:  Anti-infectives    Start     Dose/Rate Route Frequency Ordered Stop   04/03/17 1000  meropenem (MERREM) 1 g in sodium chloride 0.9 % 100 mL IVPB     1 g 200 mL/hr over 30 Minutes Intravenous Every 12 hours 04/03/17 0948     04/01/17 1800  cefTRIAXone (ROCEPHIN) 1 g in dextrose 5 % 50 mL IVPB  Status:  Discontinued      1 g 100 mL/hr over 30 Minutes Intravenous Every 24 hours 03/31/17 1950 04/01/17 1353   04/01/17 1400  cefTRIAXone (ROCEPHIN) 2 g in dextrose 5 % 50 mL IVPB  Status:  Discontinued     2 g 100 mL/hr over 30 Minutes Intravenous Every 24 hours 04/01/17 1353 04/03/17 0948   03/31/17 1630  cefTRIAXone (ROCEPHIN) 1 g in dextrose 5 % 50 mL IVPB     1 g 100 mL/hr over 30 Minutes Intravenous  Once 03/31/17 1617 03/31/17 2038       Subjective: Patient alert and awake this morning. No complaints, states she doesn't feel well but cannot characterize complaint. States her right arm hurts    Objective: Vitals:   04/05/17 0554 04/05/17 1434 04/05/17 2144 04/06/17 0630  BP: 131/77 (!) 99/54 110/63 (!) 102/56  Pulse: 80 81 78 87  Resp: 18 18 18 18   Temp: 97.7 F (36.5 C) 97.6 F (36.4 C) 98.9 F (37.2 C) 97.9 F (36.6 C)  TempSrc: Oral Oral Oral Oral  SpO2: 100% 100% 100% 100%  Weight:    60.4 kg (133 lb 2.5 oz)  Height:        Intake/Output Summary (Last 24 hours) at 04/06/17 1329 Last data filed at 04/06/17 1211  Gross per 24 hour  Intake              590 ml  Output  1000 ml  Net             -410 ml   Filed Weights   04/04/17 0429 04/04/17 1153 04/06/17 0630  Weight: 63.5 kg (139 lb 15.9 oz) 60.3 kg (132 lb 15 oz) 60.4 kg (133 lb 2.5 oz)    Examination:  General exam: Appears comfortable, resting in bed Respiratory system: Bilateral crackles. Respiratory effort better today, RR 20s, appears comfortable  Cardiovascular system: S1 & S2 heard, irreg rhythm. No JVD, murmurs, rubs, gallops or clicks. No pedal edema. Gastrointestinal system: Abdomen is nondistended, soft and nontender. No organomegaly or masses felt. Normal bowel sounds heard. Central nervous system: Alert, moves all extremities spontaneously, nonfocal, pupils remain unequal R>L  Extremities: Symmetric in appearance  Skin: +bilateral heel ulcers, +right lateral malleolar ulcer with screw visible. No acute  infection.   Data Reviewed: I have personally reviewed following labs and imaging studies  CBC:  Recent Labs Lab 04/01/17 0200 04/02/17 0300 04/03/17 0337 04/04/17 0400 04/05/17 0724 04/06/17 1147  WBC 20.8* 22.5* 18.4* 15.0* 10.5 9.5  NEUTROABS 18.9*  --  17.2* 13.6* 9.1* 8.0*  HGB 8.9* 8.9* 8.9* 8.6* 10.4* 9.7*  HCT 26.4* 26.5* 26.1* 25.4* 30.6* 30.4*  MCV 92.6 92.7 91.3 90.4 89.7 91.8  PLT 177 180 98* 140* 159 628   Basic Metabolic Panel:  Recent Labs Lab 04/02/17 0300 04/03/17 0337 04/04/17 0400 04/05/17 0724 04/06/17 1147  NA 139 139 141 139 139  K 3.6 3.3* 3.5 3.2* 3.6  CL 108 107 107 98* 97*  CO2 21* 22 28 32 34*  GLUCOSE 78 110* 102* 81 121*  BUN 23* 26* 22* 16 18  CREATININE 1.45* 1.32* 1.16* 1.03* 0.98  CALCIUM 8.0* 8.2* 8.2* 8.4* 8.8*  MG 1.6* 2.3  --   --   --    GFR: Estimated Creatinine Clearance: 37.8 mL/min (by C-G formula based on SCr of 0.98 mg/dL). Liver Function Tests:  Recent Labs Lab 03/31/17 1524 04/01/17 0702  AST 23 24  ALT 22 22  ALKPHOS 70 81  BILITOT 0.7 0.6  PROT 5.8* 5.7*  ALBUMIN 2.7* 2.7*   No results for input(s): LIPASE, AMYLASE in the last 168 hours. No results for input(s): AMMONIA in the last 168 hours. Coagulation Profile:  Recent Labs Lab 04/02/17 0300 04/03/17 0337 04/04/17 0400 04/05/17 0724 04/06/17 1147  INR 4.94* 4.35* 2.89 2.09 2.06   Cardiac Enzymes:  Recent Labs Lab 04/01/17 0214  TROPONINI <0.03   BNP (last 3 results) No results for input(s): PROBNP in the last 8760 hours. HbA1C: No results for input(s): HGBA1C in the last 72 hours. CBG:  Recent Labs Lab 04/01/17 0100  GLUCAP 83   Lipid Profile: No results for input(s): CHOL, HDL, LDLCALC, TRIG, CHOLHDL, LDLDIRECT in the last 72 hours. Thyroid Function Tests: No results for input(s): TSH, T4TOTAL, FREET4, T3FREE, THYROIDAB in the last 72 hours. Anemia Panel: No results for input(s): VITAMINB12, FOLATE, FERRITIN, TIBC, IRON,  RETICCTPCT in the last 72 hours. Sepsis Labs:  Recent Labs Lab 04/01/17 0214 04/01/17 0305 04/01/17 0700 04/03/17 0337 04/05/17 0724  PROCALCITON 8.53  --   --  43.47 12.49  LATICACIDVEN  --  2.1* 1.8  --   --     Recent Results (from the past 240 hour(s))  Urine culture     Status: Abnormal   Collection Time: 03/31/17  2:49 PM  Result Value Ref Range Status   Specimen Description URINE, CLEAN CATCH  Final   Special  Requests NONE  Final   Culture (A)  Final    >=100,000 COLONIES/mL PROTEUS MIRABILIS Susceptibility Pattern Suggests Possibility of an Extended Spectrum Beta Lactamase Producer. Contact Laboratory Within 7 Days if Confirmation Warranted. Performed at [facility], [place] [address], [place], [place] [address]    Report Status 04/03/2017 FINAL  Final   Organism ID, Bacteria PROTEUS MIRABILIS (A)  Final      Susceptibility   Proteus mirabilis - MIC*    AMPICILLIN >=32 RESISTANT Resistant     CEFAZOLIN >=64 RESISTANT Resistant     CEFTRIAXONE >=64 RESISTANT Resistant     CIPROFLOXACIN >=4 RESISTANT Resistant     GENTAMICIN <=1 SENSITIVE Sensitive     IMIPENEM 2 SENSITIVE Sensitive     NITROFURANTOIN 128 RESISTANT Resistant     TRIMETH/SULFA >=320 RESISTANT Resistant     AMPICILLIN/SULBACTAM 8 SENSITIVE Sensitive     PIP/TAZO <=4 SENSITIVE Sensitive     * >=100,000 COLONIES/mL PROTEUS MIRABILIS  Blood culture (routine x 2)     Status: Abnormal   Collection Time: 03/31/17  6:26 PM  Result Value Ref Range Status   Specimen Description BLOOD LEFT ANTECUBITAL  Final   Special Requests   Final    IN PEDIATRIC BOTTLE Blood Culture results may not be optimal due to an excessive volume of blood received in culture bottles   Culture  Setup Time   Final    GRAM NEGATIVE RODS IN PEDIATRIC BOTTLE CRITICAL RESULT CALLED TO, READ BACK BY AND VERIFIED WITH: PHARMD N [place] [id number] MLM    Culture (A)  Final    PROTEUS MIRABILIS Susceptibility Pattern Suggests  Possibility of an Extended Spectrum Beta Lactamase Producer. Contact Laboratory Within 7 Days if Confirmation Warranted. Performed at [facility], [place] [address], [place], [place] [address]    Report Status 04/03/2017 FINAL  Final   Organism ID, Bacteria PROTEUS MIRABILIS  Final      Susceptibility   Proteus mirabilis - MIC*    AMPICILLIN >=32 RESISTANT Resistant     CEFAZOLIN >=64 RESISTANT Resistant     CEFEPIME RESISTANT Resistant     CEFTAZIDIME RESISTANT Resistant     CEFTRIAXONE >=64 RESISTANT Resistant     CIPROFLOXACIN >=4 RESISTANT Resistant     GENTAMICIN <=1 SENSITIVE Sensitive     IMIPENEM 1 SENSITIVE Sensitive     TRIMETH/SULFA >=320 RESISTANT Resistant     AMPICILLIN/SULBACTAM 8 SENSITIVE Sensitive     PIP/TAZO <=4 SENSITIVE Sensitive     * PROTEUS MIRABILIS  Blood Culture ID Panel (Reflexed)     Status: Abnormal   Collection Time: 03/31/17  6:26 PM  Result Value Ref Range Status   Enterococcus species NOT DETECTED NOT DETECTED Final   Vancomycin resistance NOT DETECTED NOT DETECTED Final   Listeria monocytogenes NOT DETECTED NOT DETECTED Final   Staphylococcus species NOT DETECTED NOT DETECTED Final   Staphylococcus aureus NOT DETECTED NOT DETECTED Final   Methicillin resistance NOT DETECTED NOT DETECTED Final   Streptococcus species NOT DETECTED NOT DETECTED Final   Streptococcus agalactiae NOT DETECTED NOT DETECTED Final   Streptococcus pneumoniae NOT DETECTED NOT DETECTED Final   Streptococcus pyogenes NOT DETECTED NOT DETECTED Final   Acinetobacter baumannii NOT DETECTED NOT DETECTED Final   Enterobacteriaceae species DETECTED (A) NOT DETECTED Final    Comment: CRITICAL RESULT CALLED TO, READ BACK BY AND VERIFIED WITH: PHARMD N GLOGOVAC [id number] MLM    Enterobacter cloacae complex NOT DETECTED NOT DETECTED Final  Escherichia coli NOT DETECTED NOT DETECTED Final   Klebsiella oxytoca NOT DETECTED NOT DETECTED Final   Klebsiella pneumoniae NOT  DETECTED NOT DETECTED Final   Proteus species DETECTED (A) NOT DETECTED Final    Comment: CRITICAL RESULT CALLED TO, READ BACK BY AND VERIFIED WITH: PHARMD N GLOGOVAC [id number] MLM    Serratia marcescens NOT DETECTED NOT DETECTED Final   Carbapenem resistance NOT DETECTED NOT DETECTED Final   Haemophilus influenzae NOT DETECTED NOT DETECTED Final   Neisseria meningitidis NOT DETECTED NOT DETECTED Final   Pseudomonas aeruginosa NOT DETECTED NOT DETECTED Final   Candida albicans NOT DETECTED NOT DETECTED Final   Candida glabrata NOT DETECTED NOT DETECTED Final   Candida krusei NOT DETECTED NOT DETECTED Final   Candida parapsilosis NOT DETECTED NOT DETECTED Final   Candida tropicalis NOT DETECTED NOT DETECTED Final    Comment: Performed at [facility], [place] [address] [place], [place] [address]  Blood culture (routine x 2)     Status: Abnormal   Collection Time: 03/31/17  6:43 PM  Result Value Ref Range Status   Specimen Description BLOOD RIGHT ARM  Final   Special Requests   Final    IN PEDIATRIC BOTTLE Blood Culture results may not be optimal due to an excessive volume of blood received in culture bottles   Culture  Setup Time   Final    GRAM NEGATIVE RODS IN PEDIATRIC BOTTLE CRITICAL VALUE NOTED.  VALUE IS CONSISTENT WITH PREVIOUSLY REPORTED AND CALLED VALUE.    Culture (A)  Final    PROTEUS MIRABILIS SUSCEPTIBILITIES PERFORMED ON PREVIOUS CULTURE WITHIN THE LAST 5 DAYS. Performed at [facility], [place] [address], [place], [place] [address]    Report Status 04/03/2017 FINAL  Final  MRSA PCR Screening     Status: None   Collection Time: 04/01/17  4:32 AM  Result Value Ref Range Status   MRSA by PCR NEGATIVE NEGATIVE Final    Comment:        The GeneXpert MRSA Assay (FDA approved for NASAL specimens only), is one component of a comprehensive MRSA colonization surveillance program. It is not intended to diagnose MRSA infection nor to guide or monitor treatment  for MRSA infections.        Radiology Studies: Dg Chest Port 1 View  Result Date: 04/06/2017 CLINICAL DATA:  PICC line placement EXAM: PORTABLE CHEST 1 VIEW COMPARISON:  04/04/2017 FINDINGS: Right PICC line in place with the tip in the mid right atrium approximately 3 cm below the cavoatrial junction. Cardiomegaly with vascular congestion. Bilateral lower lobe atelectasis or infiltrates with small layering effusions, not significantly changed since prior study. IMPRESSION: Right PICC line tip in the mid right atrium approximately 3 cm below the cavoatrial junction. Stable layering effusions with bibasilar atelectasis or infiltrates. Electronically Signed   By: Rolm Baptise M.D.   On: 04/06/2017 09:06      Scheduled Meds: . alteplase  2 mg Intracatheter Once  . alteplase  2 mg Intracatheter Once  . Chlorhexidine Gluconate Cloth  6 each Topical Daily  . collagenase   Topical Daily  . dorzolamide  1 drop Left Eye BID  . feeding supplement (ENSURE ENLIVE)  237 mL Oral BID BM  . FLUoxetine  40 mg Oral Daily  . furosemide  20 mg Oral BID  . gabapentin  600 mg Oral QHS  . latanoprost  1 drop Left Eye QHS  . levothyroxine  75 mcg Oral QAC breakfast  .  mirtazapine  7.5 mg Oral QHS  . multivitamin  15 mL Oral Daily  . pantoprazole  40 mg Oral Daily  . pilocarpine  1 drop Left Eye BID  . rOPINIRole  1 mg Oral QHS  . timolol  1 drop Left Eye BID  . warfarin  0.5 mg Oral ONCE-1800  . Warfarin - Pharmacist Dosing Inpatient   Does not apply q1800   Continuous Infusions: . meropenem (MERREM) IV Stopped (04/06/17 0953)     LOS: 5 days    Time spent: 30 minutes   Dessa Phi, DO [facility] [email or web address] Password [email or web address] 04/06/2017, 1:29 PM

## 2017-04-07 DIAGNOSIS — E43 Unspecified severe protein-calorie malnutrition: Secondary | ICD-10-CM

## 2017-04-07 DIAGNOSIS — N179 Acute kidney failure, unspecified: Secondary | ICD-10-CM

## 2017-04-07 LAB — CBC WITH DIFFERENTIAL/PLATELET
BASOS PCT: 0 %
Basophils Absolute: 0 10*3/uL (ref 0.0–0.1)
EOS ABS: 0.2 10*3/uL (ref 0.0–0.7)
EOS PCT: 2 %
HCT: 26.7 % — ABNORMAL LOW (ref 36.0–46.0)
Hemoglobin: 8.9 g/dL — ABNORMAL LOW (ref 12.0–15.0)
Lymphocytes Relative: 8 %
Lymphs Abs: 0.7 10*3/uL (ref 0.7–4.0)
MCH: 31 pg (ref 26.0–34.0)
MCHC: 33.3 g/dL (ref 30.0–36.0)
MCV: 93 fL (ref 78.0–100.0)
MONO ABS: 0.7 10*3/uL (ref 0.1–1.0)
MONOS PCT: 8 %
Neutro Abs: 7.8 10*3/uL — ABNORMAL HIGH (ref 1.7–7.7)
Neutrophils Relative %: 82 %
Platelets: 164 10*3/uL (ref 150–400)
RBC: 2.87 MIL/uL — ABNORMAL LOW (ref 3.87–5.11)
RDW: 17.2 % — AB (ref 11.5–15.5)
WBC: 9.4 10*3/uL (ref 4.0–10.5)

## 2017-04-07 LAB — BASIC METABOLIC PANEL
Anion gap: 6 (ref 5–15)
BUN: 21 mg/dL — ABNORMAL HIGH (ref 6–20)
CALCIUM: 8.5 mg/dL — AB (ref 8.9–10.3)
CO2: 34 mmol/L — AB (ref 22–32)
CREATININE: 1.1 mg/dL — AB (ref 0.44–1.00)
Chloride: 98 mmol/L — ABNORMAL LOW (ref 101–111)
GFR calc non Af Amer: 44 mL/min — ABNORMAL LOW (ref >60)
GFR, EST AFRICAN AMERICAN: 50 mL/min — AB (ref >60)
Glucose, Bld: 85 mg/dL (ref 65–99)
Potassium: 3.8 mmol/L (ref 3.5–5.1)
SODIUM: 138 mmol/L (ref 135–145)

## 2017-04-07 LAB — PROTIME-INR
INR: 1.9
PROTHROMBIN TIME: 22.1 s — AB (ref 11.4–15.2)

## 2017-04-07 MED ORDER — SPIRONOLACTONE 25 MG PO TABS: 25.0000 mg | ORAL_TABLET | Freq: Every day | ORAL | Status: DC

## 2017-04-07 MED ORDER — ENSURE ENLIVE PO LIQD: 237.0000 mL | Freq: Two times a day (BID) | ORAL | 0 refills | Status: DC

## 2017-04-07 MED ORDER — WARFARIN SODIUM 1 MG PO TABS
1.0000 mg | ORAL_TABLET | Freq: Once | ORAL | Status: AC
  Administered 2017-04-07: 1 mg via ORAL
  Filled 2017-04-07: qty 1

## 2017-04-07 MED ORDER — GABAPENTIN 300 MG PO CAPS
300.0000 mg | ORAL_CAPSULE | Freq: Every day | ORAL | Status: DC
  Administered 2017-04-07: 300 mg via ORAL
  Filled 2017-04-07: qty 1

## 2017-04-07 NOTE — Progress Notes (Addendum)
ANTICOAGULATION CONSULT NOTE - Follow Up Consult  Pharmacy Consult for Warfarin Indication: atrial fibrillation  No Known Allergies   Vital Signs:    Labs:  Recent Labs  04/05/17 0724 04/06/17 1147 04/07/17 0356  HGB 10.4* 9.7* 8.9*  HCT 30.6* 30.4* 26.7*  PLT 159 182 164  LABPROT 23.8* 23.6* 22.1*  INR 2.09 2.06 1.90  CREATININE 1.03* 0.98 1.10*    Estimated Creatinine Clearance: 33.9 mL/min (A) (by C-G formula based on SCr of 1.1 mg/dL (H)).   Medical History: Past Medical History:  Diagnosis Date  . Atrial fibrillation (HCC)    chronic Coumadin.   Marland Kitchen COPD (chronic obstructive pulmonary disease) ([place])   . Glaucoma   . Hypothyroidism   . Neuropathy    PERIPHERAL  . Osteoarthritis   . Osteoporosis   . Trimalleolar fracture    LEFT TRIMALLEOLAR FRACTURE, DISLOCATION WITH OBLIQUE FRACTURE OF THE DISTAL FIBULAR DIAPHYSIS, AND NOTED BEING MILDLY COMMINUTED     Assessment: 77 y/oF with PMH of atrial fibrillation on warfarin, chronic respiratory failure, combined systolic and diastolic heart failure, HTN, hypothyroidism, chronic pressure ulcers who was seen in ED 03/21/2017 and diagnosed with URI/COPD, UTI-sent home with Doxycycline and Cephalexin. Patient presented to [facility] ED today, 03/31/2017, with increased lethargy, weakness, confusion, decreased PO intake, intermittent lower abdominal/pelvic discomfort, and hematuria. UA c/w UTI. Pharmacy consulted by [facility] to assist with dosing of warfarin while patient in the hospital. INR on admission 3. CBC reveals Hgb 10.8 (stable as compared to previous values), Pltc WNL. Home dose of warfarin reported as 1mg  PO daily, with last dose taken 03/30/2017 at 1800.   Today, 04/07/2017  INR 1.90, warfarin was held 8/16-8/18 for supratherapeutic INR, resumed with low dose 3 nights ago  Hgb low, stable and platelets WNL  Hematuria resolved  Dysphagia diet + Ensure ordered  Goal of Therapy:  INR 2-3   Plan:   Warfarin 1 mg tonight.    Daily PT/INR.   Recommend resuming warfarin 1 mg daily at discharge with INR follow up Friday or Monday.  May need to alternate 1 mg and 0.5 mg doses during the week.  Hershal Coria, PharmD, BCPS Pager: [phone] 04/07/2017 8:39 AM

## 2017-04-07 NOTE — Progress Notes (Signed)
  Speech Language Pathology Treatment: Dysphagia  Patient Details Name: [name] MRN: [id number] DOB: 1927-04-20 Today's Date: 04/07/2017 Time: 0677-0340 SLP Time Calculation (min) (ACUTE ONLY): 28 min  Assessment / Plan / Recommendation Clinical Impression  Pt in room with daughter - Pt requesting Ensure - chocolate flavored.  Observed consuming Ensure via straw - no indications of overt airway compromise = but pt does report 'feeling full' with throat clearing and belching. Daughter states pt's swallow ability is improved compared to prior to admit. SLP reviewed prior esophagram from 10/2016 and MBS with pt/daughter and reviewed compensation strategies to mitigate risk.  Provided information verbally and in writing.  Daughter has a very good understanding of pt's dysphagia and compensations.  Heimlich maneuver, xerostomia and esophageal mitigations provided to pt and daughter.  Suspect pt is at baseline of swallow ability at this time.  Will sign off.    HPI HPI: 83 y.o.femalewith medical history significant for atrial fib on coumadin, chronic respiratory failure, chronic combined systolic and diastolic heart failure, HTN, hypothyroidism, and chronic pressure ulcers. She was evaluated in the ED a week ago for confusion and lethargy, was diagnosed with URI/COPD exacerbation as well as UTI.  Since then, she has not gotten any better. She has continued to be lethargic, not acting her normal self, weak, confused, with decreased PO intake. At baseline, she does not ambulate. She is able to assist in transferring to motorized wheelchair. Dx acute metabolic encephalopathy, UTI, septic shock. Pt underwent MBS during Feb 2018 admission, which revealed grossly functional swallow with age-related changes.  Notable was severely reduced motility of the esophagus, as well as reflux and backflow of barium into the pharynx.  F/u esophagram 11/04/16 with findings of high-grade distal esophageal stricture.        SLP Plan  All goals met       Recommendations  Liquids provided via: Cup;Straw Medication Administration: Crushed with puree Supervision: Staff to assist with self feeding;Full supervision/cueing for compensatory strategies Compensations: Slow rate;Small sips/bites Postural Changes and/or Swallow Maneuvers: Seated upright 90 degrees;Upright 30-60 min after meal                Oral Care Recommendations: Oral care BID SLP Visit Diagnosis: Dysphagia, pharyngoesophageal phase (R13.14) Plan: All goals met       GO               Adrienne Salk, MS [facility] SLP [phone]  [name] 04/07/2017, 12:59 PM

## 2017-04-07 NOTE — Progress Notes (Signed)
PT Note  Patient Details Name: [name] MRN: [id number] DOB: July 31, 1927   Eval completed this afternoon. Spent time with patient and spoke with patient's daughter and patient's son. Transferred pt OOB to recline Max assist for scooting, feel patient would benefit from ST-SNF for rehab in order to regain strength to be able to assit family with her care at home.  Full write up to follow.      Clide Dales 04/07/2017, 6:03 PM  Clide Dales, PT Pager: [phone] 04/07/2017

## 2017-04-07 NOTE — Care Management Important Message (Signed)
Important Message  Patient Details  Name: [name] MRN: [id number] Date of Birth: 09/19/1926   Medicare Important Message Given:  Yes    Kerin Salen 04/07/2017, 11:16 AMImportant Message  Patient Details  Name: [name] MRN: [id number] Date of Birth: 04-21-1927   Medicare Important Message Given:  Yes    Kerin Salen 04/07/2017, 11:16 AM

## 2017-04-07 NOTE — Evaluation (Signed)
Physical Therapy Evaluation Patient Details Name: [name] MRN: [id number] DOB: 01-22-1927 Today's Date: 04/07/2017   History of Present Illness  HPI: 83 y.o.femalewith medical history significant for atrial fib on coumadin, chronic respiratory failure, chronic combined systolic and diastolic heart failure, HTN, hypothyroidism, and chronic pressure ulcers. She was evaluated in the ED a week ago for confusion and lethargy, was diagnosed with URI/COPD exacerbation as well as UTI.  Since then, she has not gotten any better. She has continued to be lethargic, not acting her normal self, weak, confused, with decreased PO intake. At baseline, she does not ambulate. She is able to assist in transferring to motorized wheelchair. Dx acute metabolic encephalopathy, UTI, septic shock.  Clinical Impression  Spent time with patient and spoke with patient's daughter and patient's son. Transferred pt OOB to recliner Max assist for scooting, feel patient would benefit from ST-SNF for rehab in order to regain strength to be able to assist family with her care at home.    Follow Up Recommendations SNF    Equipment Recommendations  None recommended by PT    Recommendations for Other Services       Precautions / Restrictions Precautions Precautions: Other (comment) Precaution Comments: heel pressure and sacral wounds so be careful with scooting and time up in the chair (rotating sides) Required Braces or Orthoses: Other Brace/Splint Other Brace/Splint: bilateral soft pressure relief boots Restrictions Weight Bearing Restrictions: Yes      Mobility  Bed Mobility Overal bed mobility: Needs Assistance Bed Mobility: Rolling;Supine to Sit;Sit to Supine Rolling: Max assist   Supine to sit: Max assist Sit to supine: Max assist;+2 for physical assistance   General bed mobility comments: cues for assisting and use of UE and hands on rails to assist as well. Pt did participate and try to help, she is  just very weak at this time.   Transfers Overall transfer level: Needs assistance Equipment used: None (have level surface and drop arm recliner) Transfers: Lateral/Scoot Transfers          Lateral/Scoot Transfers: Max assist;+2 physical assistance (pt unable. she has been doing later scoots for about 9 months now at home with family, however she used to be able to help more with her UEs until lately) General transfer comment: bed to recliner was max A of one person, however recliner back to bed was use of 2 person which was helpful. used chuck pad under patient to assist with scoot and to minimize any sheering of sacral area for patient.   Ambulation/Gait Ambulation/Gait assistance:  (pt unable. she has been doing later scoots for about 9 months now at home with family, however she used to be able to help more with her UEs until lately)              Stairs            Wheelchair Mobility    Modified Rankin (Stroke Patients Only)       Balance Overall balance assessment: Needs assistance Sitting-balance support: Bilateral upper extremity supported;Feet supported (had to cues and assist pt with place at least LLE on floor to assist with balance. and assist with how she could help with her UEs. ) Sitting balance-Leahy Scale: Fair Sitting balance - Comments: about 5 minutes. assistespeically with any balance that was outside . Cues to try to sit upright for pt was in flexed kyphotic postuer and lateral lean to R more than left  Postural control: Posterior lean;Right lateral lean  Pertinent Vitals/Pain Pain Assessment: 0-10 Pain Score: 4  Pain Location: R foot (on lateral ankle area where ulcers are)  is painful even at rest and has very little Active movement.  Pain Descriptors / Indicators: Aching;Burning Pain Intervention(s): Monitored during session;Repositioned;Other (comment) (reapllied booties at end of session while  in chair )    Home Living Family/patient expects to be discharged to:: Private residence Living Arrangements: Children;Other (Comment) Available Help at Discharge: Family;Available 24 hours/day (hired help only comes 2 x/week, so the care is challenging for the daughter at times ) Type of Home: House Home Access: Ramped entrance     [place]: One level [place]: Youth worker - 2 wheels;Shower seat - built in;Bedside commode;Hospital bed Additional Comments: Pts son is one of her primary caregivers; he was recently hospitalized for back sx, and the daughter has injury from helping to lift and care for her mother as well. She also has a lift chair     Prior Function Level of Independence: Needs assistance   Gait / Transfers Assistance Needed: Transfers only with assistance, does scoot transfer or sliding board. 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  LLE Deficits / Details: LE ankle DF only to neutral for AAROM.     Cervical / Trunk Assessment Cervical / Trunk Assessment: Kyphotic  Communication   Communication: No difficulties  Cognition Arousal/Alertness: Awake/alert Behavior During Therapy: WFL for tasks assessed/performed Overall Cognitive Status: Within Functional Limits for tasks assessed                                        General Comments      Exercises Other Exercises Other Exercises: educated family with shifting weight ever 30 minutes in bed and in chair at home to change pressure points to minimize  pressure ulcers as much as possible, and explained nature of sheering of patient in flexed position and sliding at times and being slide back up .    Assessment/Plan    PT Assessment Patient needs continued PT services  PT Problem List Decreased strength;Decreased range of motion;Decreased activity tolerance;Decreased mobility       PT Treatment Interventions Functional mobility training;Therapeutic activities;Therapeutic exercise;Balance training;Patient/family education    PT Goals (Current goals can be found in the Care Plan section)  Acute Rehab PT Goals Patient Stated Goal: I want to go home  PT Goal Formulation: With patient/family Time For Goal Achievement: 04/21/17 Potential to Achieve Goals: Good    Frequency Min 3X/week   Barriers to discharge        Co-evaluation               AM-PAC PT "6 Clicks" Daily Activity  Outcome Measure Difficulty turning over in bed (including adjusting bedclothes, sheets and blankets)?: Unable Difficulty moving from lying on back to sitting on the side of the bed? : Unable Difficulty sitting down on and standing up from  a chair with arms (e.g., wheelchair, bedside commode, etc,.)?: Unable Help needed moving to and from a bed to chair (including a wheelchair)?: Total Help needed walking in hospital room?: Total Help needed climbing 3-5 steps with a railing? : Total 6 Click Score: 6    End of Session   Activity Tolerance: Patient tolerated treatment well (pt ended up in recliner for about an hour to eat dinner and then helped with assisting patient back to bed as well. ) Patient left: in chair;with call bell/phone within reach;with family/visitor present Nurse Communication: Mobility status (helped assist pt back to bed as well with nursing assiisting with transfer) PT Visit Diagnosis: Muscle weakness (generalized) (M62.81)    Time: 2094-7096 PT Time Calculation (min) (ACUTE ONLY): 42 min   Charges:   PT Evaluation $PT Eval  Low Complexity: 1 Low PT Treatments $Therapeutic Activity: 23-37 mins   PT G CodesClide Dales, PT Pager: [phone] 04/07/2017   Sarh Kirschenbaum, Gatha Mayer 04/07/2017, 8:53 PM

## 2017-04-07 NOTE — Progress Notes (Signed)
Adrienne NOTE  [name]  XLK:[id number] DOB: 1926-12-10 DOA: 03/31/2017 PCP: Tamsen Roers, Adrienne  Brief Narrative:   [name] a 83 y.o.femalewith medical history significant of atrial fib on [name], [name], [name] diastolic heart failure, HTN, hypothyroidism, [name] Adrienne pressure ulcers. She was evaluated in the ED a week prior to admission for confusion [name] lethargy, was diagnosed with URI/COPD exacerbation as well as UTI. She was given steroids, doxycyline [name] keflex [name] sent home. She continued to be lethargic [name] she became more Kierre Deines of breath with labored breathing.  She returned to the ER.  Chest x-ray demonstrated pulmonary edema. She was given 20 mg of IV Lasix. During nursing evaluation, patient was seen leaning to the left, left facial droop, more difficult to arouse, unequal pupils. Rapid response was called. Patient underwent CT head stat which was unremarkable for acute etiology. During CT, patient's blood pressure dropped to the 60s. She was given 1 L fluid bolus [name] transferred to ICU, PCCM was consulted, patient started on vasopressor. Neurology was consulted over the phone, they recommended MRI, MRA brain.  On morning of 8/16, symptoms had resolved. Further questioning of family found that patient has these episodes occur  chronically at home of difficult arousal [name] has unequal pupils at baseline due to glaucoma, although typically left side is larger than right side. She became verbal with improvement in BP.  PCCM canceled MRI/MRA as they did not feel stroke work up was necessary. Blood cultures returned with proteus [name] rocephin was increased to 2g IV. Sensitivity returned ESBL [name] antibiotics changed to [facility] [name] she has completed 5 of 7 days of therapy.  She is more lethargic again today [name] it appears she was restarted on gabapentin yesterday.  Family requesting SNF placement because they do not  feel they can handle her increased LOC [name] PICC line at home.  SW consult placed.    Assessment & Plan:   Principal Problem:   Acute metabolic encephalopathy Active Problems:   UTI (urinary tract infection)   Pressure injury of skin   Protein-calorie malnutrition, severe   Labored breathing  Acute toxic encephalopathy -Treat infection as below -lethargic again today  Septic shock secondary to ESBL proteus UTI (POA) [name] ESBL proteus bacteremia  -Failure of outpatient treatment with Keflex -Urine culture with ESBL proteus mirabilis  -Blood culture with ESBL proteus mirabilis  -Continue merrem, day 5 of 7 today -Off vasopressor. Cortisol 39.2. BP stable   Acute neurologic change 8/15-8/16 -Left facial droop, unequal pupils (per daughter, patient's pupils are unequal at baseline with L>R due to glaucoma. However on my examination, pupils are R>L which is the opposite of her baseline pupil difference)  -CT head without acute etiology -Overnight team 8/15 spoke with neurology on call who recommended MRI, MRA to r/o stroke, but has since been canceled by [facility] team as mentation [name] neurologic change improved [name] back to baseline. Per family, patient does not tolerate MRI well so we can hold off.  -EEG completed which showed diffuse cerebral dysfunction that is non-specific in etiology [name] can be seen with hypoxic/ischemic injury, toxic/metabolic encephalopathies, neurodegenerative disorders, or medication effect -Resolved [name] back to baseline   Acute on Adrienne systolic [name] diastolic heart failure -EF 30-35% -Follows with Dr. Einar Gip -Spoke with Dr. Einar Gip over the phone 8/16. He recommends lasix 40mg  IV BID [name] continue to monitor. However due to hypotension, lasix could not be given. She was  given IVF for BP support  -CXR 8/20 without significant interval change in bilateral pleural effusions, persistent pulmonary venous congestion  -d/c lasix [name] resume spironolactone  AKI -Baseline  Cr 1.1. Due to combination of hypotension, infection, lasix  -Renal US unremarkable, limited exam due to positioning -Continue to treat infection as above  -Monitor UOP  -Improved [name] back to baseline   Adrienne atrial fibrillation -[name] per pharmacy    Adrienne respiratory failure -2.5-3L [place] [name] at baseline   Adrienne pressure ulcers on her coccyx, bilateral heels, right lateral malleoli -Does not appear acutely infected -Turn every 2 hours -Wound RN consult  Hypertension -Hold antihypertensives due to low BP. Monitor BP.   Hypothyroidism -Continue Synthroid  Hematuria -In setting of UTI [name] [name] use. Resolved.   Goals of care -Full code in this 83 yo female with multiple medical problems, septic shock, respiratory failure, [name] nonambulatory at baseline with multiple pressure wounds. Discussed with daughter about goals of care. Patient had been DNR at one point in the past but currently remains full code. Recommend palliative care consult.  -Appreciate palliative care medicine. Remains full code.    DVT prophylaxis: [name] Code Status: full Family Communication:  daughter at bedside.  Does not want to go home with PICC line.  Prefers SNF.   Disposition Plan:  sedated again today.  Anticipate discharge to SNF on 8/23.  Consultants:   PCCM  Neurology over the phone 8/15   Cardiology over the phone 8/16  Palliative care   Procedures:   PICC replaced on 8/21  Antimicrobials:  Anti-infectives    Start     Dose/Rate Route Frequency Ordered Stop   04/06/17 0000  meropenem (MERREM) IVPB     1 g Intravenous Every 12 hours 04/06/17 1442 04/09/17 2359   04/03/17 1000  meropenem (MERREM) 1 g in sodium chloride 0.9 % 100 mL IVPB     1 g 200 mL/hr over 30 Minutes Intravenous Every 12 hours 04/03/17 0948     04/01/17 1800  cefTRIAXone (ROCEPHIN) 1 g in dextrose 5 % 50 mL IVPB  Status:  Discontinued     1 g 100 mL/hr over 30 Minutes Intravenous Every 24  hours 03/31/17 1950 04/01/17 1353   04/01/17 1400  cefTRIAXone (ROCEPHIN) 2 g in dextrose 5 % 50 mL IVPB  Status:  Discontinued     2 g 100 mL/hr over 30 Minutes Intravenous Every 24 hours 04/01/17 1353 04/03/17 0948   03/31/17 1630  cefTRIAXone (ROCEPHIN) 1 g in dextrose 5 % 50 mL IVPB     1 g 100 mL/hr over 30 Minutes Intravenous  Once 03/31/17 1617 03/31/17 2038       Subjective:  Difficult historian today.  Lethargic.  Daughter states she has been very sleepy all morning.  Ordering some lunch which, if she wakes up, will be her first meal of the day.     Objective: Vitals:   04/06/17 1500 04/06/17 2000 04/07/17 0500 04/07/17 1416  BP: 110/62 108/65  (!) 98/43  Pulse: 88 76  81  Resp: 18 18  18   Temp: 97.9 F (36.6 C) 97.8 F (36.6 C)  97.9 F (36.6 C)  TempSrc: Oral Oral  Oral  SpO2: 100% 100%  100%  Weight:   60.8 kg (134 lb)   Height:        Intake/Output Summary (Last 24 hours) at 04/07/17 1750 Last data filed at 04/07/17 1300  Gross per 24 hour  Intake  360 ml  Output              300 ml  Net               60 ml   Filed Weights   04/04/17 1153 04/06/17 0630 04/07/17 0500  Weight: 60.3 kg (132 lb 15 oz) 60.4 kg (133 lb 2.5 oz) 60.8 kg (134 lb)    Examination:  General exam:  Adult female.  No acute distress.  Lying in bed.  Opens eyes.  Nasal canula in place HEENT:  NCAT, MMM Respiratory system: Clear to auscultation bilaterally Cardiovascular system: IRRR, normal S1/S2. No murmurs, rubs, gallops or clicks.  Warm extremities Gastrointestinal system: Normal active bowel sounds, soft, nondistended, nontender. MSK:  Normal tone [name] bulk, trace bilateral lower extremity edema Neuro:  Minimally moves all extremities    Data Reviewed: I have personally reviewed following labs [name] imaging studies  CBC:  Recent Labs Lab 04/03/17 0337 04/04/17 0400 04/05/17 0724 04/06/17 1147 04/07/17 0356  WBC 18.4* 15.0* 10.5 9.5 9.4  NEUTROABS 17.2*  13.6* 9.1* 8.0* 7.8*  HGB 8.9* 8.6* 10.4* 9.7* 8.9*  HCT 26.1* 25.4* 30.6* 30.4* 26.7*  MCV 91.3 90.4 89.7 91.8 93.0  PLT 98* 140* 159 182 347   Basic Metabolic Panel:  Recent Labs Lab 04/02/17 0300 04/03/17 0337 04/04/17 0400 04/05/17 0724 04/06/17 1147 04/07/17 0356  NA 139 139 141 139 139 138  K 3.6 3.3* 3.5 3.2* 3.6 3.8  CL 108 107 107 98* 97* 98*  CO2 21* 22 28 32 34* 34*  GLUCOSE 78 110* 102* 81 121* 85  BUN 23* 26* 22* 16 18 21*  CREATININE 1.45* 1.32* 1.16* 1.03* 0.98 1.10*  CALCIUM 8.0* 8.2* 8.2* 8.4* 8.8* 8.5*  MG 1.6* 2.3  --   --   --   --    GFR: Estimated Creatinine Clearance: 33.9 mL/min (A) (by C-G formula based on SCr of 1.1 mg/dL (H)). Liver Function Tests:  Recent Labs Lab 04/01/17 0702  AST 24  ALT 22  ALKPHOS 81  BILITOT 0.6  PROT 5.7*  ALBUMIN 2.7*   No results for input(s): LIPASE, AMYLASE in the last 168 hours. No results for input(s): AMMONIA in the last 168 hours. Coagulation Profile:  Recent Labs Lab 04/03/17 0337 04/04/17 0400 04/05/17 0724 04/06/17 1147 04/07/17 0356  INR 4.35* 2.89 2.09 2.06 1.90   Cardiac Enzymes:  Recent Labs Lab 04/01/17 0214  TROPONINI <0.03   BNP (last 3 results) No results for input(s): PROBNP in the last 8760 hours. HbA1C: No results for input(s): HGBA1C in the last 72 hours. CBG:  Recent Labs Lab 04/01/17 0100  GLUCAP 83   Lipid Profile: No results for input(s): CHOL, HDL, LDLCALC, TRIG, CHOLHDL, LDLDIRECT in the last 72 hours. Thyroid Function Tests: No results for input(s): TSH, T4TOTAL, FREET4, T3FREE, THYROIDAB in the last 72 hours. Anemia Panel: No results for input(s): VITAMINB12, FOLATE, FERRITIN, TIBC, IRON, RETICCTPCT in the last 72 hours. Urine analysis:    Component Value Date/Time   COLORURINE RED (A) 03/31/2017 1449   APPEARANCEUR TURBID (A) 03/31/2017 1449   LABSPEC  03/31/2017 1449    TEST NOT REPORTED DUE TO COLOR INTERFERENCE OF URINE PIGMENT   PHURINE   03/31/2017 1449    TEST NOT REPORTED DUE TO COLOR INTERFERENCE OF URINE PIGMENT   GLUCOSEU (A) 03/31/2017 1449    TEST NOT REPORTED DUE TO COLOR INTERFERENCE OF URINE PIGMENT   HGBUR (A) 03/31/2017 1449  TEST NOT REPORTED DUE TO COLOR INTERFERENCE OF URINE PIGMENT   BILIRUBINUR (A) 03/31/2017 1449    TEST NOT REPORTED DUE TO COLOR INTERFERENCE OF URINE PIGMENT   KETONESUR (A) 03/31/2017 1449    TEST NOT REPORTED DUE TO COLOR INTERFERENCE OF URINE PIGMENT   PROTEINUR (A) 03/31/2017 1449    TEST NOT REPORTED DUE TO COLOR INTERFERENCE OF URINE PIGMENT   UROBILINOGEN 1.0 11/22/2010 0949   NITRITE (A) 03/31/2017 1449    TEST NOT REPORTED DUE TO COLOR INTERFERENCE OF URINE PIGMENT   LEUKOCYTESUR (A) 03/31/2017 1449    TEST NOT REPORTED DUE TO COLOR INTERFERENCE OF URINE PIGMENT   Sepsis Labs: @LABRCNTIP (procalcitonin:4,lacticidven:4)  ) Recent Results (from the past 240 hour(s))  Urine culture     Status: Abnormal   Collection Time: 03/31/17  2:49 PM  Result Value Ref Range Status   Specimen Description URINE, CLEAN CATCH  Final   Special Requests NONE  Final   Culture (A)  Final    >=100,000 COLONIES/mL PROTEUS MIRABILIS Susceptibility Pattern Suggests Possibility of an Extended Spectrum Beta Lactamase Producer. Contact Laboratory Within 7 Days if Confirmation Warranted. Performed at [facility], [place] [address], [place], [place] [address]    Report Status 04/03/2017 FINAL  Final   Organism ID, Bacteria PROTEUS MIRABILIS (A)  Final      Susceptibility   Proteus mirabilis - MIC*    AMPICILLIN >=32 RESISTANT Resistant     CEFAZOLIN >=64 RESISTANT Resistant     CEFTRIAXONE >=64 RESISTANT Resistant     CIPROFLOXACIN >=4 RESISTANT Resistant     GENTAMICIN <=1 SENSITIVE Sensitive     IMIPENEM 2 SENSITIVE Sensitive     NITROFURANTOIN 128 RESISTANT Resistant     TRIMETH/SULFA >=320 RESISTANT Resistant     AMPICILLIN/SULBACTAM 8 SENSITIVE Sensitive     PIP/TAZO <=4 SENSITIVE  Sensitive     * >=100,000 COLONIES/mL PROTEUS MIRABILIS  Blood culture (routine x 2)     Status: Abnormal   Collection Time: 03/31/17  6:26 PM  Result Value Ref Range Status   Specimen Description BLOOD LEFT ANTECUBITAL  Final   Special Requests   Final    IN PEDIATRIC BOTTLE Blood Culture results may not be optimal due to an excessive volume of blood received in culture bottles   Culture  Setup Time   Final    GRAM NEGATIVE RODS IN PEDIATRIC BOTTLE CRITICAL RESULT CALLED TO, READ BACK BY [name] VERIFIED WITH: PHARMD N [place] [id number] MLM    Culture (A)  Final    PROTEUS MIRABILIS Susceptibility Pattern Suggests Possibility of an Extended Spectrum Beta Lactamase Producer. Contact Laboratory Within 7 Days if Confirmation Warranted. Performed at [facility], [place] [address], [place], [place] [address]    Report Status 04/03/2017 FINAL  Final   Organism ID, Bacteria PROTEUS MIRABILIS  Final      Susceptibility   Proteus mirabilis - MIC*    AMPICILLIN >=32 RESISTANT Resistant     CEFAZOLIN >=64 RESISTANT Resistant     CEFEPIME RESISTANT Resistant     CEFTAZIDIME RESISTANT Resistant     CEFTRIAXONE >=64 RESISTANT Resistant     CIPROFLOXACIN >=4 RESISTANT Resistant     GENTAMICIN <=1 SENSITIVE Sensitive     IMIPENEM 1 SENSITIVE Sensitive     TRIMETH/SULFA >=320 RESISTANT Resistant     AMPICILLIN/SULBACTAM 8 SENSITIVE Sensitive     PIP/TAZO <=4 SENSITIVE Sensitive     * PROTEUS MIRABILIS  Blood Culture ID Panel (  Reflexed)     Status: Abnormal   Collection Time: 03/31/17  6:26 PM  Result Value Ref Range Status   Enterococcus species NOT DETECTED NOT DETECTED Final   Vancomycin resistance NOT DETECTED NOT DETECTED Final   Listeria monocytogenes NOT DETECTED NOT DETECTED Final   Staphylococcus species NOT DETECTED NOT DETECTED Final   Staphylococcus aureus NOT DETECTED NOT DETECTED Final   Methicillin resistance NOT DETECTED NOT DETECTED Final   Streptococcus species NOT  DETECTED NOT DETECTED Final   Streptococcus agalactiae NOT DETECTED NOT DETECTED Final   Streptococcus pneumoniae NOT DETECTED NOT DETECTED Final   Streptococcus pyogenes NOT DETECTED NOT DETECTED Final   Acinetobacter baumannii NOT DETECTED NOT DETECTED Final   Enterobacteriaceae species DETECTED (A) NOT DETECTED Final    Comment: CRITICAL RESULT CALLED TO, READ BACK BY [name] VERIFIED WITH: PHARMD N GLOGOVAC [id number] MLM    Enterobacter cloacae complex NOT DETECTED NOT DETECTED Final   Escherichia coli NOT DETECTED NOT DETECTED Final   Klebsiella oxytoca NOT DETECTED NOT DETECTED Final   Klebsiella pneumoniae NOT DETECTED NOT DETECTED Final   Proteus species DETECTED (A) NOT DETECTED Final    Comment: CRITICAL RESULT CALLED TO, READ BACK BY [name] VERIFIED WITH: PHARMD N GLOGOVAC [id number] MLM    Serratia marcescens NOT DETECTED NOT DETECTED Final   Carbapenem resistance NOT DETECTED NOT DETECTED Final   Haemophilus influenzae NOT DETECTED NOT DETECTED Final   Neisseria meningitidis NOT DETECTED NOT DETECTED Final   Pseudomonas aeruginosa NOT DETECTED NOT DETECTED Final   Candida albicans NOT DETECTED NOT DETECTED Final   Candida glabrata NOT DETECTED NOT DETECTED Final   Candida krusei NOT DETECTED NOT DETECTED Final   Candida parapsilosis NOT DETECTED NOT DETECTED Final   Candida tropicalis NOT DETECTED NOT DETECTED Final    Comment: Performed at [facility], [address] [place], [place] [address]  Blood culture (routine x 2)     Status: Abnormal   Collection Time: 03/31/17  6:43 PM  Result Value Ref Range Status   Specimen Description BLOOD RIGHT ARM  Final   Special Requests   Final    IN PEDIATRIC BOTTLE Blood Culture results may not be optimal due to an excessive volume of blood received in culture bottles   Culture  Setup Time   Final    GRAM NEGATIVE RODS IN PEDIATRIC BOTTLE CRITICAL VALUE NOTED.  VALUE IS CONSISTENT WITH PREVIOUSLY REPORTED [name] CALLED  VALUE.    Culture (A)  Final    PROTEUS MIRABILIS SUSCEPTIBILITIES PERFORMED ON PREVIOUS CULTURE WITHIN THE LAST 5 DAYS. Performed at [facility], [place] [address] [place], [place] [address]    Report Status 04/03/2017 FINAL  Final  MRSA PCR Screening     Status: None   Collection Time: 04/01/17  4:32 AM  Result Value Ref Range Status   MRSA by PCR NEGATIVE NEGATIVE Final    Comment:        The GeneXpert MRSA Assay (FDA approved for NASAL specimens only), is one component of a comprehensive MRSA colonization surveillance program. It is not intended to diagnose MRSA infection nor to guide or monitor treatment for MRSA infections.       Radiology Studies: Dg Chest Port 1 View  Result Date: 04/06/2017 CLINICAL DATA:  PICC line placement EXAM: PORTABLE CHEST 1 VIEW COMPARISON:  04/04/2017 FINDINGS: Right PICC line in place with the tip in the mid right atrium approximately 3 cm below the cavoatrial junction. Cardiomegaly  with vascular congestion. Bilateral lower lobe atelectasis or infiltrates with small layering effusions, not significantly changed since prior study. IMPRESSION: Right PICC line tip in the mid right atrium approximately 3 cm below the cavoatrial junction. Stable layering effusions with bibasilar atelectasis or infiltrates. Electronically Signed   By: Rolm Baptise M.D.   On: 04/06/2017 09:06     Scheduled Meds: . alteplase  2 mg Intracatheter Once  . alteplase  2 mg Intracatheter Once  . Chlorhexidine Gluconate Cloth  6 each Topical Daily  . collagenase   Topical Daily  . dorzolamide  1 drop Left Eye BID  . feeding supplement (ENSURE ENLIVE)  237 mL Oral BID BM  . FLUoxetine  40 mg Oral Daily  . furosemide  20 mg Oral BID  . gabapentin  300 mg Oral QHS  . latanoprost  1 drop Left Eye QHS  . levothyroxine  75 mcg Oral QAC breakfast  . mirtazapine  7.5 mg Oral QHS  . multivitamin  15 mL Oral Daily  . pantoprazole  40 mg Oral Daily  . pilocarpine  1 drop  Left Eye BID  . rOPINIRole  1 mg Oral QHS  . timolol  1 drop Left Eye BID  . warfarin  1 mg Oral ONCE-1800  . Warfarin - Pharmacist Dosing Inpatient   Does not apply q1800   Continuous Infusions: . meropenem (MERREM) IV Stopped (04/07/17 1117)     LOS: 6 days    Time spent: 30 min    Janece Canterbury, Adrienne [facility] Pager [phone]  If 7PM-7AM, please contact night-coverage [email or web address] Password [email or web address] 04/07/2017, 5:50 PM

## 2017-04-08 DIAGNOSIS — I959 Hypotension, unspecified: Secondary | ICD-10-CM

## 2017-04-08 LAB — CBC
HCT: 27.2 % — ABNORMAL LOW (ref 36.0–46.0)
Hemoglobin: 9 g/dL — ABNORMAL LOW (ref 12.0–15.0)
MCH: 30.8 pg (ref 26.0–34.0)
MCHC: 33.1 g/dL (ref 30.0–36.0)
MCV: 93.2 fL (ref 78.0–100.0)
PLATELETS: 191 10*3/uL (ref 150–400)
RBC: 2.92 MIL/uL — ABNORMAL LOW (ref 3.87–5.11)
RDW: 17.3 % — AB (ref 11.5–15.5)
WBC: 9.7 10*3/uL (ref 4.0–10.5)

## 2017-04-08 LAB — BASIC METABOLIC PANEL
ANION GAP: 6 (ref 5–15)
BUN: 25 mg/dL — AB (ref 6–20)
CALCIUM: 8.8 mg/dL — AB (ref 8.9–10.3)
CO2: 34 mmol/L — AB (ref 22–32)
CREATININE: 1 mg/dL (ref 0.44–1.00)
Chloride: 97 mmol/L — ABNORMAL LOW (ref 101–111)
GFR calc Af Amer: 57 mL/min — ABNORMAL LOW (ref >60)
GFR, EST NON AFRICAN AMERICAN: 49 mL/min — AB (ref >60)
GLUCOSE: 90 mg/dL (ref 65–99)
Potassium: 4.4 mmol/L (ref 3.5–5.1)
Sodium: 137 mmol/L (ref 135–145)

## 2017-04-08 LAB — PROTIME-INR
INR: 1.5
Prothrombin Time: 18.3 seconds — ABNORMAL HIGH (ref 11.4–15.2)

## 2017-04-08 MED ORDER — WARFARIN SODIUM 1 MG PO TABS
1.5000 mg | ORAL_TABLET | Freq: Once | ORAL | Status: AC
  Administered 2017-04-08: 17:00:00 1.5 mg via ORAL
  Filled 2017-04-08: qty 1

## 2017-04-08 MED ORDER — SODIUM CHLORIDE 0.9 % IV BOLUS (SEPSIS)
1000.0000 mL | Freq: Once | INTRAVENOUS | Status: AC
  Administered 2017-04-08: 1000 mL via INTRAVENOUS

## 2017-04-08 MED ORDER — SPIRONOLACTONE 25 MG PO TABS: 12.5000 mg | ORAL_TABLET | Freq: Every day | ORAL | Status: DC

## 2017-04-08 MED ORDER — SODIUM CHLORIDE 0.9 % IV BOLUS (SEPSIS)
250.0000 mL | Freq: Once | INTRAVENOUS | Status: AC
  Administered 2017-04-08: 250 mL via INTRAVENOUS

## 2017-04-08 NOTE — Progress Notes (Signed)
BP post IV bolus 99/58. Pt alert and talking. SRP, RN

## 2017-04-08 NOTE — Progress Notes (Signed)
Pt BP 83/37, MD notified. SRP, RN

## 2017-04-08 NOTE — Clinical Social Work Note (Signed)
Clinical Social Work Assessment  Patient Details  Name: [name] MRN: [id number] Date of Birth: September 21, 1926  Date of referral:  04/08/17               Reason for consult:  Facility Placement, Discharge Planning                Permission sought to share information with:  Family Supports Permission granted to share information::  Yes, Verbal Permission Granted  Name::     daughter  Agency::     Relationship::     Contact Information:     Housing/Transportation Living arrangements for the past 2 months:  Single Family Home Source of Information:  Medical Team, Adult Children Patient Interpreter Needed:  None Criminal Activity/Legal Involvement Pertinent to Current Situation/Hospitalization:  No - Comment as needed Significant Relationships:  Adult Children Lives with:  Adult Children Do you feel safe going back to the place where you live?  Yes Need for family participation in patient care:  Yes (Comment) (daughter primary caretaker and decision maker)  Care giving concerns:  Pt from home where she resides with her daughter. Son and granddaughter live nearby and all share in pt's care.  Family has lift, shower bench, bedside commode at home.  At baseline uses motorized wheelchair, scoots to transfer OOB and to chair.    Social Worker assessment / plan: CSW consulted for potential SNF placement.  Met with pt (sleeping) at bedside and discussed with daughter and CM. Pt has [facility] in home and much equipment, have been preparing to have pt come directly home at DC rather than SNF. Pt currently to need IV abx at DC and extensive wound care along with PT recommendation of ST rehab.  Daughter states she and pt's son have discussed that they are open to SNF referral "only to [facility]. She was there earlier this year and we don't feel comfortable with any other facility." CSW discussed at length pt's needs and family's preparation to have pt at home.  Daughter provided  with SNF list, states she will discuss with family however they maintain that if [facility] unable to accept, they will continue with plan to have pt return home.  Completed FL2, referred to [facility].   Plan: SNF ([facility]) versus home with home health  Employment status:  Retired Forensic scientist:  Medicare PT Recommendations:  [place] / Referral to community resources:  [place]  Patient/Family's Response to care:  patient sleeping however daughter very engaged and appreciative of care  Patient/Family's Understanding of and Emotional Response to Diagnosis, Current Treatment, and Prognosis:  Daughter demonstrates thorough understanding of pt's treatment and plan, asked pertinent questions and demonstrates well-thought-out responses to inquiries about plans and potential barriers.  Seems accepting and positive re: pt's prognosis.   Emotional Assessment Appearance:  Appears stated age Attitude/Demeanor/Rapport:   (sleeping) Affect (typically observed):   (sleeping) Orientation:   (sleeping) Alcohol / Substance use:  Not Applicable Psych involvement (Current and /or in the community):  No (Comment)  Discharge Needs  Concerns to be addressed:  Discharge Planning Concerns Readmission within the last 30 days:  No Current discharge risk:  None Barriers to Discharge:  Continued Medical Work up   [employer], LCSW 04/08/2017, 4:21 PM  [phone]

## 2017-04-08 NOTE — NC FL2 (Signed)
[place] LEVEL OF CARE SCREENING TOOL     IDENTIFICATION  Patient Name: [name] Birthdate: 30-Aug-1926 Sex: female Admission Date (Current Location): 03/31/2017  [facility] and [place] Number:  Herbalist and Address:  [facility],  [place] [address], [place]      Provider Number: [id number]  Attending Physician Name and Address:  Janece Canterbury, MD  Relative Name and Phone Number:       Current Level of Care: Hospital Recommended Level of Care: [place] Prior Approval Number:    Date Approved/Denied:   PASRR Number: [id number]  Discharge Plan: SNF    Current Diagnoses: Patient Active Problem List   Diagnosis Date Noted  . Labored breathing   . Pressure injury of skin 04/01/2017  . Protein-calorie malnutrition, severe 04/01/2017  . UTI (urinary tract infection) 03/31/2017  . Acute metabolic encephalopathy 83/15/1761  . Respiratory distress   . Acute systolic heart failure ([place]) 10/09/2016  . Cardiomyopathy ([place]) 10/09/2016  . Goals of care, counseling/discussion   . Palliative care encounter   . Encounter for hospice care discussion   . Dysphagia   . COPD with acute exacerbation ([place]) 10/04/2016  . Sepsis due to pneumonia ([place]) 10/04/2016  . Acute encephalopathy 10/04/2016  . Debility   . CAP (community acquired pneumonia) 10/03/2016  . AKI (acute kidney injury) ([place]) 10/03/2016  . Hypokalemia 10/03/2016  . Normocytic anemia 10/03/2016  . Acute respiratory failure with hypoxia ([place]) 10/03/2016  . Chronic diastolic CHF (congestive heart failure) ([place]) 10/03/2016  . Hypotension 10/03/2016  . Atrial fibrillation ([place])   . Hypothyroidism   . Glaucoma   . Neuropathy   . Osteoporosis   . Osteoarthritis   . Fall   . Trimalleolar fracture   . HYPOTHYROIDISM 03/02/2007  . GLAUCOMA 03/02/2007  . ATRIAL FIBRILLATION 03/02/2007  . PSORIASIS 03/02/2007    Orientation RESPIRATION BLADDER Height  & Weight     Self, Place  O2 (3L) Incontinent, External catheter Weight: 133 lb 9.6 oz (60.6 kg) Height:  5\' 9"  (175.3 cm)  BEHAVIORAL SYMPTOMS/MOOD NEUROLOGICAL BOWEL NUTRITION STATUS      Incontinent Diet (dysphasia II, thin fluid consistency)  AMBULATORY STATUS COMMUNICATION OF NEEDS Skin   Extensive Assist Verbally PU Stage and Appropriate Care (incision)  pressure injury stageII, sacrum, foam dressing, daily changes  Pressure injury stage III, heel, gauze dressing, daily changes  nonpressure wound ankle right, lateral hardware exposed, silver dressings, daily changes                     Personal Care Assistance Level of Assistance  Bathing, Feeding, Dressing Bathing Assistance: Limited assistance Feeding assistance: Independent Dressing Assistance: Limited assistance     Functional Limitations Info  Sight, Hearing, Speech Sight Info: Adequate Hearing Info: Adequate Speech Info: Adequate    SPECIAL CARE FACTORS FREQUENCY  PT (By licensed PT), OT (By licensed OT), Speech therapy     PT Frequency: 5x OT Frequency: 5x     Speech Therapy Frequency: 2x      Contractures Contractures Info: Not present    Additional Factors Info  Code Status, Allergies, Isolation Precautions Code Status Info: full code Allergies Info: nka     Isolation Precautions Info: contact precautions- ESBL     Current Medications (04/08/2017):  This is the current hospital active medication list Current Facility-Administered Medications  Medication Dose Route Frequency Provider Last Rate Last Dose  . acetaminophen (TYLENOL) tablet 650 mg  650 mg Oral Q6H PRN Dessa Phi Chahn-Yang, DO   650 mg at 04/07/17 1305   Or  . acetaminophen (TYLENOL) suppository 650 mg  650 mg Rectal Q6H PRN Dessa Phi Chahn-Yang, DO      . albuterol (PROVENTIL) (2.5 MG/3ML) 0.083% nebulizer solution 2.5 mg  2.5 mg Nebulization Q4H PRN Dessa Phi Chahn-Yang, DO   2.5 mg at 04/03/17 1133  . alteplase  (CATHFLO ACTIVASE) injection 2 mg  2 mg Intracatheter Once Dessa Phi Chahn-Yang, DO      . alteplase (CATHFLO ACTIVASE) injection 2 mg  2 mg Intracatheter Once Dessa Phi Chahn-Yang, DO      . bisacodyl (DULCOLAX) EC tablet 5 mg  5 mg Oral Daily PRN Dessa Phi Chahn-Yang, DO      . Chlorhexidine Gluconate Cloth 2 % PADS 6 each  6 each Topical Daily Dessa Phi Chahn-Yang, DO   6 each at 04/08/17 1000  . collagenase (SANTYL) ointment   Topical Daily Dessa Phi Chahn-Yang, DO      . dorzolamide (TRUSOPT) 2 % ophthalmic solution 1 drop  1 drop Left Eye BID Dessa Phi Chahn-Yang, DO   1 drop at 04/08/17 1110  . feeding supplement (ENSURE ENLIVE) (ENSURE ENLIVE) liquid 237 mL  237 mL Oral BID BM Dessa Phi Chahn-Yang, DO   237 mL at 04/08/17 1443  . FLUoxetine (PROZAC) capsule 40 mg  40 mg Oral Daily Dessa Phi Chahn-Yang, DO   40 mg at 04/08/17 1101  . latanoprost (XALATAN) 0.005 % ophthalmic solution 1 drop  1 drop Left Eye QHS Minda Ditto, RPH   1 drop at 04/07/17 2041  . levothyroxine (SYNTHROID, LEVOTHROID) tablet 75 mcg  75 mcg Oral QAC breakfast Dessa Phi Chahn-Yang, DO   75 mcg at 04/08/17 0845  . loperamide (IMODIUM) capsule 2 mg  2 mg Oral Daily PRN Dessa Phi Chahn-Yang, DO      . meropenem (MERREM) 1 g in sodium chloride 0.9 % 100 mL IVPB  1 g Intravenous Q12H Polly Cobia, RPH   Stopped at 04/08/17 1130  . mirtazapine (REMERON) tablet 7.5 mg  7.5 mg Oral QHS Dessa Phi Chahn-Yang, DO   7.5 mg at 04/07/17 2040  . multivitamin liquid 15 mL  15 mL Oral Daily Dessa Phi Chahn-Yang, DO   15 mL at 04/08/17 1111  . ondansetron (ZOFRAN) tablet 4 mg  4 mg Oral Q6H PRN Dessa Phi Chahn-Yang, DO       Or  . ondansetron [facility]) injection 4 mg  4 mg Intravenous Q6H PRN Dessa Phi Chahn-Yang, DO      . pantoprazole (PROTONIX) EC tablet 40 mg  40 mg Oral Daily Dara Hoyer, RPH   40 mg at 04/08/17 1101  . pilocarpine (PILOCAR) 4 %  ophthalmic solution 1 drop  1 drop Left Eye BID Dessa Phi Chahn-Yang, DO   1 drop at 04/08/17 1109  . rOPINIRole (REQUIP) tablet 1 mg  1 mg Oral QHS Dessa Phi Chahn-Yang, DO   1 mg at 04/07/17 2040  . senna-docusate (Senokot-S) tablet 1 tablet  1 tablet Oral QHS PRN Dessa Phi Chahn-Yang, DO      . sodium chloride flush (NS) 0.9 % injection 10-40 mL  10-40 mL Intracatheter PRN Dessa Phi Chahn-Yang, DO      . timolol (TIMOPTIC) 0.5 % ophthalmic solution 1 drop  1 drop Left Eye BID Dessa Phi Chahn-Yang, DO   1 drop at 04/08/17 1109  . warfarin (COUMADIN) tablet 1.5 mg  1.5 mg Oral ONCE-1800 Dara Hoyer, [facility]      . Warfarin - Pharmacist Dosing Inpatient   Does not apply q1800 Luiz Ochoa [facility]   Stopped at 04/02/17 1800     Discharge Medications: Please see discharge summary for a list of discharge medications.  Relevant Imaging Results:  Relevant Lab Results:   Additional Information SS#: [id number]. has PICC line for IV antibiotics  Nila Nephew, LCSW

## 2017-04-08 NOTE — Progress Notes (Signed)
Spoke with pt's daughter Baker Janus at bedside concerning discharge needs. Baker Janus request that pt go to [facility]. CSW informed.

## 2017-04-08 NOTE — Progress Notes (Signed)
ANTICOAGULATION CONSULT NOTE - Follow Up Consult  Pharmacy Consult for Warfarin Indication: atrial fibrillation  No Known Allergies   Vital Signs: Temp: 98.7 F (37.1 C) (08/23 0557) Temp Source: Oral (08/23 0557) BP: 80/47 (08/23 0735) Pulse Rate: 80 (08/23 0735)  Labs:  Recent Labs  04/06/17 1147 04/07/17 0356 04/08/17 0408  HGB 9.7* 8.9* 9.0*  HCT 30.4* 26.7* 27.2*  PLT 182 164 191  LABPROT 23.6* 22.1* 18.3*  INR 2.06 1.90 1.50  CREATININE 0.98 1.10* 1.00    Estimated Creatinine Clearance: 37.2 mL/min (by C-G formula based on SCr of 1 mg/dL).   Medical History: Past Medical History:  Diagnosis Date  . Atrial fibrillation (HCC)    chronic Coumadin.   Marland Kitchen COPD (chronic obstructive pulmonary disease) ([place])   . Glaucoma   . Hypothyroidism   . Neuropathy    PERIPHERAL  . Osteoarthritis   . Osteoporosis   . Trimalleolar fracture    LEFT TRIMALLEOLAR FRACTURE, DISLOCATION WITH OBLIQUE FRACTURE OF THE DISTAL FIBULAR DIAPHYSIS, AND NOTED BEING MILDLY COMMINUTED     Assessment: 64 y/oF with PMH of atrial fibrillation on warfarin, chronic respiratory failure, combined systolic and diastolic heart failure, HTN, hypothyroidism, chronic pressure ulcers who was seen in ED 03/21/2017 and diagnosed with URI/COPD, UTI-sent home with Doxycycline and Cephalexin. Patient presented to [facility] ED today, 03/31/2017, with increased lethargy, weakness, confusion, decreased PO intake, intermittent lower abdominal/pelvic discomfort, and hematuria. UA c/w UTI. Pharmacy consulted by [facility] to assist with dosing of warfarin while patient in the hospital. INR on admission 3. CBC reveals Hgb 10.8 (stable as compared to previous values), Pltc WNL. Home dose of warfarin reported as 1mg  PO daily, with last dose taken 03/30/2017 at 1800.   Today, 04/08/2017  INR 1.50, warfarin was held 8/16-8/18 for supratherapeutic INR, resumed with lower dose.  Hgb low, stable and platelets WNL  Hematuria  resolved  Dysphagia diet + Ensure ordered  Goal of Therapy:  INR 2-3   Plan:   Warfarin 1.5 mg tonight.   Daily PT/INR.   Hershal Coria, PharmD, BCPS Pager: [phone] 04/08/2017 8:32 AM

## 2017-04-08 NOTE — Progress Notes (Signed)
Pt resting tol PO meals and has voided several times large inct episodes. Pt BP improve after liter bolus completed. BP currently 124/68. SRP, RN

## 2017-04-08 NOTE — Progress Notes (Signed)
PROGRESS NOTE  [name]  IOX:[id number] DOB: 09/29/1926 DOA: 03/31/2017 PCP: Tamsen Roers, MD  Brief Narrative:   [name] a 83 y.o.femalewith medical history significant of atrial fib on coumadin, chronic respiratory failure on 2.5-3L at baseline O2, chronic combined systolic and diastolic heart failure, HTN, hypothyroidism, and chronic pressure ulcers. She was evaluated in the ED a week prior to admission for confusion and lethargy, was diagnosed with URI/COPD exacerbation as well as UTI. She was given steroids, doxycyline and keflex and sent home. She continued to be lethargic and she became more [name] of breath with labored breathing.  She returned to the ER.  Chest x-ray demonstrated pulmonary edema. She was given 20 mg of IV Lasix. During nursing evaluation, patient was seen leaning to the left, left facial droop, more difficult to arouse, unequal pupils. Rapid response was called. Patient underwent CT head stat which was unremarkable for acute etiology. During CT, patient's blood pressure dropped to the 60s. She was given 1 L fluid bolus and transferred to ICU, PCCM was consulted, patient started on vasopressor. Neurology was consulted over the phone, they recommended MRI, MRA brain.  On morning of 8/16, symptoms had resolved. Further questioning of family found that patient has these episodes occur  chronically at home of difficult arousal and has unequal pupils at baseline due to glaucoma, although typically left side is larger than right side. She became verbal with improvement in BP.  PCCM canceled MRI/MRA as they did not feel stroke work up was necessary. Blood cultures returned with proteus and rocephin was increased to 2g IV. Sensitivity returned ESBL and antibiotics changed to [facility] and she has completed 5 of 7 days of therapy.   Hypotensive requiring repeated fluid boluses.  No new signs of infection, hemorrhage.  Assessment & Plan:   Principal Problem:   Acute  metabolic encephalopathy Active Problems:   UTI (urinary tract infection)   Pressure injury of skin   Protein-calorie malnutrition, severe   Labored breathing  Acute toxic encephalopathy, improving -Treat infection as below  Hypotension today.  Denies chest pains or chest discomfort or increased SOB.   May have gotten overdiuresed with lasix or side effect from gabapentin -  No new signs of infection -  Cortisol level previously normal -  IV fluid boluses -  D/c spironolactone and gabapentin  Septic shock secondary to ESBL proteus UTI (POA) and ESBL proteus bacteremia  -Failure of outpatient treatment with Keflex -Urine culture with ESBL proteus mirabilis  -Blood culture with ESBL proteus mirabilis  -Continue merrem, day 6 of 7 today -Off vasopressor. Cortisol 39.2.   Acute neurologic change 8/15-8/16 -Left facial droop, unequal pupils (per daughter, patient's pupils are unequal at baseline with L>R due to glaucoma. However on my examination, pupils are R>L which is the opposite of her baseline pupil difference)  -CT head without acute etiology -Overnight team 8/15 spoke with neurology on call who recommended MRI, MRA to r/o stroke, but has since been canceled by [facility] team as mentation and neurologic change improved and back to baseline. Per family, patient does not tolerate MRI well so we can hold off.  -EEG completed which showed diffuse cerebral dysfunction that is non-specific in etiology and can be seen with hypoxic/ischemic injury, toxic/metabolic encephalopathies, neurodegenerative disorders, or medication effect -Resolved and back to baseline   Acute on chronic systolic and diastolic heart failure -EF 30-35% -Follows with Dr. Einar Gip -CXR 8/20 without significant interval change in bilateral pleural effusions, persistent pulmonary  venous congestion  - continue to hold lasix and resume spironolactone  Mild AKI, creatinine peaked at 1.45, now back to baseline of 1,  resolved. -Renal US unremarkable, limited exam due to positioning  Chronic atrial fibrillation, INR subtherapeutic -  SCDs -Coumadin per pharmacy    Chronic respiratory failure -2.5-3L [place] O2 at baseline   Chronic pressure ulcers on her coccyx, bilateral heels, right lateral malleoli -Does not appear acutely infected -Turn every 2 hours -Wound RN consult - needs outpatient ABIs  Hypertension -Hold antihypertensives due to low BP. Monitor BP.   Hypothyroidism -Continue Synthroid  Hematuria -In setting of UTI and coumadin use. Resolved.  Goals of care -Full code in this 83 yo female with multiple medical problems, septic shock, respiratory failure, and nonambulatory at baseline with multiple pressure wounds. Discussed with daughter about goals of care. Patient had been DNR at one point in the past but currently remains full code. Recommend palliative care consult.  -Appreciate palliative care medicine. Remains full code.    DVT prophylaxis: coumadin Code Status: full Family Communication:  no family at bedside Disposition Plan:  more alert today, but hypotensive.  Anticipate discharge to SNF on 8/24 if blood pressure recovers after fluid boluses.  Consultants:   PCCM  Neurology over the phone 8/15   Cardiology over the phone 8/16  Palliative care   Procedures:   PICC replaced on 8/21  Antimicrobials:  Anti-infectives    Start     Dose/Rate Route Frequency Ordered Stop   04/06/17 0000  meropenem (MERREM) IVPB     1 g Intravenous Every 12 hours 04/06/17 1442 04/09/17 2359   04/03/17 1000  meropenem (MERREM) 1 g in sodium chloride 0.9 % 100 mL IVPB     1 g 200 mL/hr over 30 Minutes Intravenous Every 12 hours 04/03/17 0948     04/01/17 1800  cefTRIAXone (ROCEPHIN) 1 g in dextrose 5 % 50 mL IVPB  Status:  Discontinued     1 g 100 mL/hr over 30 Minutes Intravenous Every 24 hours 03/31/17 1950 04/01/17 1353   04/01/17 1400  cefTRIAXone (ROCEPHIN) 2 g in  dextrose 5 % 50 mL IVPB  Status:  Discontinued     2 g 100 mL/hr over 30 Minutes Intravenous Every 24 hours 04/01/17 1353 04/03/17 0948   03/31/17 1630  cefTRIAXone (ROCEPHIN) 1 g in dextrose 5 % 50 mL IVPB     1 g 100 mL/hr over 30 Minutes Intravenous  Once 03/31/17 1617 03/31/17 2038       Subjective:  Feels very fatigued/tired, but overall better today.  Denies chest pains, SOB, nausea, vomiting.  Having severe pains in her right foot  Objective: Vitals:   04/08/17 0735 04/08/17 0846 04/08/17 1026 04/08/17 1315  BP: (!) 80/47 (!) 94/46 (!) 99/58 (!) 83/37  Pulse: 80 76 76 75  Resp:   20 20  Temp:   98.3 F (36.8 C) 98.3 F (36.8 C)  TempSrc:   Oral Oral  SpO2:   96% 98%  Weight:      Height:        Intake/Output Summary (Last 24 hours) at 04/08/17 1739 Last data filed at 04/08/17 1344  Gross per 24 hour  Intake             3140 ml  Output                0 ml  Net  3140 ml   Filed Weights   04/06/17 0630 04/07/17 0500 04/08/17 0500  Weight: 60.4 kg (133 lb 2.5 oz) 60.8 kg (134 lb) 60.6 kg (133 lb 9.6 oz)    Examination:  General exam:  Adult female.  No acute distress.  Lying in bed.  Opens eyes and tracking me.  Nasal canula in place HEENT:  NCAT, MMM Respiratory system: Clear to auscultation bilaterally Cardiovascular system: IRRR, normal S1/S2. No murmurs, rubs, gallops or clicks.  Warm extremities Gastrointestinal system: Normal active bowel sounds, soft, nondistended, nontender. MSK:  Normal tone and bulk, trace bilateral lower extremity edema Neuro:  Minimally moves upper extremities.  Bilateral lower extremities 2/5.  Pain with palpation of the right foot.  unstageable heel and lateral malleolus ulcers.  Foot is warm, < 2 sec CR, but absent pedal pulse    Data Reviewed: I have personally reviewed following labs and imaging studies  CBC:  Recent Labs Lab 04/03/17 0337 04/04/17 0400 04/05/17 0724 04/06/17 1147 04/07/17 0356  04/08/17 0408  WBC 18.4* 15.0* 10.5 9.5 9.4 9.7  NEUTROABS 17.2* 13.6* 9.1* 8.0* 7.8*  --   HGB 8.9* 8.6* 10.4* 9.7* 8.9* 9.0*  HCT 26.1* 25.4* 30.6* 30.4* 26.7* 27.2*  MCV 91.3 90.4 89.7 91.8 93.0 93.2  PLT 98* 140* 159 182 164 106   Basic Metabolic Panel:  Recent Labs Lab 04/02/17 0300 04/03/17 0337 04/04/17 0400 04/05/17 0724 04/06/17 1147 04/07/17 0356 04/08/17 0408  NA 139 139 141 139 139 138 137  K 3.6 3.3* 3.5 3.2* 3.6 3.8 4.4  CL 108 107 107 98* 97* 98* 97*  CO2 21* 22 28 32 34* 34* 34*  GLUCOSE 78 110* 102* 81 121* 85 90  BUN 23* 26* 22* 16 18 21* 25*  CREATININE 1.45* 1.32* 1.16* 1.03* 0.98 1.10* 1.00  CALCIUM 8.0* 8.2* 8.2* 8.4* 8.8* 8.5* 8.8*  MG 1.6* 2.3  --   --   --   --   --    GFR: Estimated Creatinine Clearance: 37.2 mL/min (by C-G formula based on SCr of 1 mg/dL). Liver Function Tests: No results for input(s): AST, ALT, ALKPHOS, BILITOT, PROT, ALBUMIN in the last 168 hours. No results for input(s): LIPASE, AMYLASE in the last 168 hours. No results for input(s): AMMONIA in the last 168 hours. Coagulation Profile:  Recent Labs Lab 04/04/17 0400 04/05/17 0724 04/06/17 1147 04/07/17 0356 04/08/17 0408  INR 2.89 2.09 2.06 1.90 1.50   Cardiac Enzymes: No results for input(s): CKTOTAL, CKMB, CKMBINDEX, TROPONINI in the last 168 hours. BNP (last 3 results) No results for input(s): PROBNP in the last 8760 hours. HbA1C: No results for input(s): HGBA1C in the last 72 hours. CBG: No results for input(s): GLUCAP in the last 168 hours. Lipid Profile: No results for input(s): CHOL, HDL, LDLCALC, TRIG, CHOLHDL, LDLDIRECT in the last 72 hours. Thyroid Function Tests: No results for input(s): TSH, T4TOTAL, FREET4, T3FREE, THYROIDAB in the last 72 hours. Anemia Panel: No results for input(s): VITAMINB12, FOLATE, FERRITIN, TIBC, IRON, RETICCTPCT in the last 72 hours. Urine analysis:    Component Value Date/Time   COLORURINE RED (A) 03/31/2017 1449    APPEARANCEUR TURBID (A) 03/31/2017 1449   LABSPEC  03/31/2017 1449    TEST NOT REPORTED DUE TO COLOR INTERFERENCE OF URINE PIGMENT   PHURINE  03/31/2017 1449    TEST NOT REPORTED DUE TO COLOR INTERFERENCE OF URINE PIGMENT   GLUCOSEU (A) 03/31/2017 1449    TEST NOT REPORTED DUE TO COLOR INTERFERENCE  OF URINE PIGMENT   HGBUR (A) 03/31/2017 1449    TEST NOT REPORTED DUE TO COLOR INTERFERENCE OF URINE PIGMENT   BILIRUBINUR (A) 03/31/2017 1449    TEST NOT REPORTED DUE TO COLOR INTERFERENCE OF URINE PIGMENT   KETONESUR (A) 03/31/2017 1449    TEST NOT REPORTED DUE TO COLOR INTERFERENCE OF URINE PIGMENT   PROTEINUR (A) 03/31/2017 1449    TEST NOT REPORTED DUE TO COLOR INTERFERENCE OF URINE PIGMENT   UROBILINOGEN 1.0 11/22/2010 0949   NITRITE (A) 03/31/2017 1449    TEST NOT REPORTED DUE TO COLOR INTERFERENCE OF URINE PIGMENT   LEUKOCYTESUR (A) 03/31/2017 1449    TEST NOT REPORTED DUE TO COLOR INTERFERENCE OF URINE PIGMENT   Sepsis Labs: @LABRCNTIP (procalcitonin:4,lacticidven:4)  ) Recent Results (from the past 240 hour(s))  Urine culture     Status: Abnormal   Collection Time: 03/31/17  2:49 PM  Result Value Ref Range Status   Specimen Description URINE, CLEAN CATCH  Final   Special Requests NONE  Final   Culture (A)  Final    >=100,000 COLONIES/mL PROTEUS MIRABILIS Susceptibility Pattern Suggests Possibility of an Extended Spectrum Beta Lactamase Producer. Contact Laboratory Within 7 Days if Confirmation Warranted. Performed at [facility], [place] [address], [place], [place] [address]    Report Status 04/03/2017 FINAL  Final   Organism ID, Bacteria PROTEUS MIRABILIS (A)  Final      Susceptibility   Proteus mirabilis - MIC*    AMPICILLIN >=32 RESISTANT Resistant     CEFAZOLIN >=64 RESISTANT Resistant     CEFTRIAXONE >=64 RESISTANT Resistant     CIPROFLOXACIN >=4 RESISTANT Resistant     GENTAMICIN <=1 SENSITIVE Sensitive     IMIPENEM 2 SENSITIVE Sensitive     NITROFURANTOIN  128 RESISTANT Resistant     TRIMETH/SULFA >=320 RESISTANT Resistant     AMPICILLIN/SULBACTAM 8 SENSITIVE Sensitive     PIP/TAZO <=4 SENSITIVE Sensitive     * >=100,000 COLONIES/mL PROTEUS MIRABILIS  Blood culture (routine x 2)     Status: Abnormal   Collection Time: 03/31/17  6:26 PM  Result Value Ref Range Status   Specimen Description BLOOD LEFT ANTECUBITAL  Final   Special Requests   Final    IN PEDIATRIC BOTTLE Blood Culture results may not be optimal due to an excessive volume of blood received in culture bottles   Culture  Setup Time   Final    GRAM NEGATIVE RODS IN PEDIATRIC BOTTLE CRITICAL RESULT CALLED TO, READ BACK BY AND VERIFIED WITH: PHARMD N Meagher [id number] MLM    Culture (A)  Final    PROTEUS MIRABILIS Susceptibility Pattern Suggests Possibility of an Extended Spectrum Beta Lactamase Producer. Contact Laboratory Within 7 Days if Confirmation Warranted. Performed at [facility], [place] [address] [place], [place] [address]    Report Status 04/03/2017 FINAL  Final   Organism ID, Bacteria PROTEUS MIRABILIS  Final      Susceptibility   Proteus mirabilis - MIC*    AMPICILLIN >=32 RESISTANT Resistant     CEFAZOLIN >=64 RESISTANT Resistant     CEFEPIME RESISTANT Resistant     CEFTAZIDIME RESISTANT Resistant     CEFTRIAXONE >=64 RESISTANT Resistant     CIPROFLOXACIN >=4 RESISTANT Resistant     GENTAMICIN <=1 SENSITIVE Sensitive     IMIPENEM 1 SENSITIVE Sensitive     TRIMETH/SULFA >=320 RESISTANT Resistant     AMPICILLIN/SULBACTAM 8 SENSITIVE Sensitive     PIP/TAZO <=4 SENSITIVE Sensitive     *  PROTEUS MIRABILIS  Blood Culture ID Panel (Reflexed)     Status: Abnormal   Collection Time: 03/31/17  6:26 PM  Result Value Ref Range Status   Enterococcus species NOT DETECTED NOT DETECTED Final   Vancomycin resistance NOT DETECTED NOT DETECTED Final   Listeria monocytogenes NOT DETECTED NOT DETECTED Final   Staphylococcus species NOT DETECTED NOT DETECTED Final    Staphylococcus aureus NOT DETECTED NOT DETECTED Final   Methicillin resistance NOT DETECTED NOT DETECTED Final   Streptococcus species NOT DETECTED NOT DETECTED Final   Streptococcus agalactiae NOT DETECTED NOT DETECTED Final   Streptococcus pneumoniae NOT DETECTED NOT DETECTED Final   Streptococcus pyogenes NOT DETECTED NOT DETECTED Final   Acinetobacter baumannii NOT DETECTED NOT DETECTED Final   Enterobacteriaceae species DETECTED (A) NOT DETECTED Final    Comment: CRITICAL RESULT CALLED TO, READ BACK BY AND VERIFIED WITH: PHARMD N GLOGOVAC [id number] MLM    Enterobacter cloacae complex NOT DETECTED NOT DETECTED Final   Escherichia coli NOT DETECTED NOT DETECTED Final   Klebsiella oxytoca NOT DETECTED NOT DETECTED Final   Klebsiella pneumoniae NOT DETECTED NOT DETECTED Final   Proteus species DETECTED (A) NOT DETECTED Final    Comment: CRITICAL RESULT CALLED TO, READ BACK BY AND VERIFIED WITH: PHARMD N GLOGOVAC [id number] MLM    Serratia marcescens NOT DETECTED NOT DETECTED Final   Carbapenem resistance NOT DETECTED NOT DETECTED Final   Haemophilus influenzae NOT DETECTED NOT DETECTED Final   Neisseria meningitidis NOT DETECTED NOT DETECTED Final   Pseudomonas aeruginosa NOT DETECTED NOT DETECTED Final   Candida albicans NOT DETECTED NOT DETECTED Final   Candida glabrata NOT DETECTED NOT DETECTED Final   Candida krusei NOT DETECTED NOT DETECTED Final   Candida parapsilosis NOT DETECTED NOT DETECTED Final   Candida tropicalis NOT DETECTED NOT DETECTED Final    Comment: Performed at [facility], [address] [place], [place] [address]  Blood culture (routine x 2)     Status: Abnormal   Collection Time: 03/31/17  6:43 PM  Result Value Ref Range Status   Specimen Description BLOOD RIGHT ARM  Final   Special Requests   Final    IN PEDIATRIC BOTTLE Blood Culture results may not be optimal due to an excessive volume of blood received in culture bottles   Culture  Setup  Time   Final    GRAM NEGATIVE RODS IN PEDIATRIC BOTTLE CRITICAL VALUE NOTED.  VALUE IS CONSISTENT WITH PREVIOUSLY REPORTED AND CALLED VALUE.    Culture (A)  Final    PROTEUS MIRABILIS SUSCEPTIBILITIES PERFORMED ON PREVIOUS CULTURE WITHIN THE LAST 5 DAYS. Performed at [facility], [place] [address] [place], [place] [address]    Report Status 04/03/2017 FINAL  Final  MRSA PCR Screening     Status: None   Collection Time: 04/01/17  4:32 AM  Result Value Ref Range Status   MRSA by PCR NEGATIVE NEGATIVE Final    Comment:        The GeneXpert MRSA Assay (FDA approved for NASAL specimens only), is one component of a comprehensive MRSA colonization surveillance program. It is not intended to diagnose MRSA infection nor to guide or monitor treatment for MRSA infections.       Radiology Studies: No results found.   Scheduled Meds: . alteplase  2 mg Intracatheter Once  . alteplase  2 mg Intracatheter Once  . Chlorhexidine Gluconate Cloth  6 each Topical Daily  . collagenase   Topical  Daily  . dorzolamide  1 drop Left Eye BID  . feeding supplement (ENSURE ENLIVE)  237 mL Oral BID BM  . FLUoxetine  40 mg Oral Daily  . latanoprost  1 drop Left Eye QHS  . levothyroxine  75 mcg Oral QAC breakfast  . mirtazapine  7.5 mg Oral QHS  . multivitamin  15 mL Oral Daily  . pantoprazole  40 mg Oral Daily  . pilocarpine  1 drop Left Eye BID  . rOPINIRole  1 mg Oral QHS  . timolol  1 drop Left Eye BID  . Warfarin - Pharmacist Dosing Inpatient   Does not apply q1800   Continuous Infusions: . meropenem (MERREM) IV Stopped (04/08/17 1130)     LOS: 7 days    Time spent: 30 min    Janece Canterbury, MD [facility] Pager [phone]  If 7PM-7AM, please contact night-coverage [email or web address] Password [facility] 04/08/2017, 5:39 PM

## 2017-04-08 NOTE — Progress Notes (Signed)
BP 124/68 Post bolus NS

## 2017-04-08 NOTE — Progress Notes (Signed)
NS 250cc bolus given BP 80/47 via prior RN. Will cont to monitor. SRP, RN

## 2017-04-09 DIAGNOSIS — G894 Chronic pain syndrome: Secondary | ICD-10-CM | POA: Diagnosis not present

## 2017-04-09 DIAGNOSIS — L89150 Pressure ulcer of sacral region, unstageable: Secondary | ICD-10-CM | POA: Diagnosis not present

## 2017-04-09 DIAGNOSIS — G9341 Metabolic encephalopathy: Secondary | ICD-10-CM | POA: Diagnosis not present

## 2017-04-09 DIAGNOSIS — R6520 Severe sepsis without septic shock: Secondary | ICD-10-CM | POA: Diagnosis not present

## 2017-04-09 DIAGNOSIS — R278 Other lack of coordination: Secondary | ICD-10-CM | POA: Diagnosis not present

## 2017-04-09 DIAGNOSIS — L89510 Pressure ulcer of right ankle, unstageable: Secondary | ICD-10-CM | POA: Diagnosis not present

## 2017-04-09 DIAGNOSIS — R1314 Dysphagia, pharyngoesophageal phase: Secondary | ICD-10-CM | POA: Diagnosis not present

## 2017-04-09 DIAGNOSIS — I5042 Chronic combined systolic (congestive) and diastolic (congestive) heart failure: Secondary | ICD-10-CM | POA: Diagnosis not present

## 2017-04-09 DIAGNOSIS — Z7189 Other specified counseling: Secondary | ICD-10-CM | POA: Diagnosis not present

## 2017-04-09 DIAGNOSIS — I482 Chronic atrial fibrillation: Secondary | ICD-10-CM | POA: Diagnosis not present

## 2017-04-09 DIAGNOSIS — M6281 Muscle weakness (generalized): Secondary | ICD-10-CM | POA: Diagnosis not present

## 2017-04-09 DIAGNOSIS — L89610 Pressure ulcer of right heel, unstageable: Secondary | ICD-10-CM | POA: Diagnosis not present

## 2017-04-09 DIAGNOSIS — R41841 Cognitive communication deficit: Secondary | ICD-10-CM | POA: Diagnosis not present

## 2017-04-09 DIAGNOSIS — R4182 Altered mental status, unspecified: Secondary | ICD-10-CM | POA: Diagnosis not present

## 2017-04-09 DIAGNOSIS — E039 Hypothyroidism, unspecified: Secondary | ICD-10-CM | POA: Diagnosis not present

## 2017-04-09 DIAGNOSIS — J9600 Acute respiratory failure, unspecified whether with hypoxia or hypercapnia: Secondary | ICD-10-CM | POA: Diagnosis not present

## 2017-04-09 DIAGNOSIS — E43 Unspecified severe protein-calorie malnutrition: Secondary | ICD-10-CM | POA: Diagnosis not present

## 2017-04-09 DIAGNOSIS — L89514 Pressure ulcer of right ankle, stage 4: Secondary | ICD-10-CM | POA: Diagnosis not present

## 2017-04-09 DIAGNOSIS — L89621 Pressure ulcer of left heel, stage 1: Secondary | ICD-10-CM | POA: Diagnosis not present

## 2017-04-09 DIAGNOSIS — N390 Urinary tract infection, site not specified: Secondary | ICD-10-CM | POA: Diagnosis not present

## 2017-04-09 DIAGNOSIS — B964 Proteus (mirabilis) (morganii) as the cause of diseases classified elsewhere: Secondary | ICD-10-CM | POA: Diagnosis not present

## 2017-04-09 DIAGNOSIS — I5032 Chronic diastolic (congestive) heart failure: Secondary | ICD-10-CM | POA: Diagnosis not present

## 2017-04-09 DIAGNOSIS — G9340 Encephalopathy, unspecified: Secondary | ICD-10-CM | POA: Diagnosis not present

## 2017-04-09 LAB — BASIC METABOLIC PANEL
ANION GAP: 4 — AB (ref 5–15)
BUN: 21 mg/dL — ABNORMAL HIGH (ref 6–20)
CO2: 29 mmol/L (ref 22–32)
Calcium: 8.1 mg/dL — ABNORMAL LOW (ref 8.9–10.3)
Chloride: 102 mmol/L (ref 101–111)
Creatinine, Ser: 0.88 mg/dL (ref 0.44–1.00)
GFR, EST NON AFRICAN AMERICAN: 57 mL/min — AB (ref >60)
GLUCOSE: 76 mg/dL (ref 65–99)
POTASSIUM: 4.3 mmol/L (ref 3.5–5.1)
Sodium: 135 mmol/L (ref 135–145)

## 2017-04-09 LAB — CBC
HEMATOCRIT: 23.5 % — AB (ref 36.0–46.0)
HEMATOCRIT: 28.3 % — AB (ref 36.0–46.0)
HEMOGLOBIN: 7.8 g/dL — AB (ref 12.0–15.0)
HEMOGLOBIN: 9.2 g/dL — AB (ref 12.0–15.0)
MCH: 31.2 pg (ref 26.0–34.0)
MCH: 30.6 pg (ref 26.0–34.0)
MCHC: 33.2 g/dL (ref 30.0–36.0)
MCHC: 32.5 g/dL (ref 30.0–36.0)
MCV: 94 fL (ref 78.0–100.0)
MCV: 94 fL (ref 78.0–100.0)
Platelets: 192 10*3/uL (ref 150–400)
Platelets: 202 10*3/uL (ref 150–400)
RBC: 2.5 MIL/uL — AB (ref 3.87–5.11)
RBC: 3.01 MIL/uL — AB (ref 3.87–5.11)
RDW: 17.1 % — AB (ref 11.5–15.5)
RDW: 17.1 % — ABNORMAL HIGH (ref 11.5–15.5)
WBC: 8.8 10*3/uL (ref 4.0–10.5)
WBC: 10.3 10*3/uL (ref 4.0–10.5)

## 2017-04-09 LAB — PROTIME-INR
INR: 1.37
Prothrombin Time: 17 seconds — ABNORMAL HIGH (ref 11.4–15.2)

## 2017-04-09 MED ORDER — WARFARIN SODIUM 2 MG PO TABS
2.0000 mg | ORAL_TABLET | Freq: Once | ORAL | Status: AC
  Administered 2017-04-09: 2 mg via ORAL
  Filled 2017-04-09: qty 1

## 2017-04-09 MED ORDER — WARFARIN SODIUM 2 MG PO TABS: 2.0000 mg | ORAL_TABLET | Freq: Once | ORAL | Status: DC

## 2017-04-09 MED ORDER — COLLAGENASE 250 UNIT/GM EX OINT: TOPICAL_OINTMENT | Freq: Every day | CUTANEOUS | 0 refills | Status: DC

## 2017-04-09 MED ORDER — SODIUM CHLORIDE 0.9 % IV SOLN
1.0000 g | INTRAVENOUS | Status: AC
  Administered 2017-04-09: 1 g via INTRAVENOUS
  Filled 2017-04-09: qty 1

## 2017-04-09 MED ORDER — FERROUS SULFATE 325 (65 FE) MG PO TABS: 325.0000 mg | ORAL_TABLET | Freq: Every day | ORAL | Status: DC

## 2017-04-09 MED ORDER — LOPERAMIDE HCL 2 MG PO CAPS: 2.0000 mg | ORAL_CAPSULE | Freq: Every day | ORAL | 0 refills | Status: AC | PRN

## 2017-04-09 MED ORDER — FERROUS SULFATE 325 (65 FE) MG PO TABS: 325.0000 mg | ORAL_TABLET | Freq: Every day | ORAL | 0 refills | Status: DC

## 2017-04-09 NOTE — Progress Notes (Signed)
ANTICOAGULATION CONSULT NOTE - Follow Up Consult  Pharmacy Consult for Warfarin Indication: atrial fibrillation  No Known Allergies   Vital Signs: Temp: 98.1 F (36.7 C) (08/24 0449) Temp Source: Oral (08/24 0449) BP: 108/58 (08/24 0449) Pulse Rate: 73 (08/24 0449)  Labs:  Recent Labs  04/07/17 0356 04/08/17 0408 04/09/17 0342  HGB 8.9* 9.0* 7.8*  HCT 26.7* 27.2* 23.5*  PLT 164 191 192  LABPROT 22.1* 18.3* 17.0*  INR 1.90 1.50 1.37  CREATININE 1.10* 1.00 0.88    Estimated Creatinine Clearance: 44 mL/min (by C-G formula based on SCr of 0.88 mg/dL).   Medical History: Past Medical History:  Diagnosis Date  . Atrial fibrillation (HCC)    chronic Coumadin.   Marland Kitchen COPD (chronic obstructive pulmonary disease) ([place])   . Glaucoma   . Hypothyroidism   . Neuropathy    PERIPHERAL  . Osteoarthritis   . Osteoporosis   . Trimalleolar fracture    LEFT TRIMALLEOLAR FRACTURE, DISLOCATION WITH OBLIQUE FRACTURE OF THE DISTAL FIBULAR DIAPHYSIS, AND NOTED BEING MILDLY COMMINUTED     Assessment: 17 y/oF with PMH of atrial fibrillation on warfarin, chronic respiratory failure, combined systolic and diastolic heart failure, HTN, hypothyroidism, chronic pressure ulcers who was seen in ED 03/21/2017 and diagnosed with URI/COPD, UTI-sent home with Doxycycline and Cephalexin. Patient presented to [facility] ED 03/31/2017 with increased lethargy, weakness, confusion, decreased PO intake, intermittent lower abdominal/pelvic discomfort, and hematuria. UA c/w UTI. Pharmacy consulted by [facility] to assist with dosing of warfarin while patient in the hospital. INR on admission 3. CBC reveals Hgb 10.8 (stable as compared to previous values), Pltc WNL. Home dose of warfarin reported as 1mg  PO daily, with last dose taken 03/30/2017 at 1800.   Today, 04/09/2017  INR decreased to 1.37, warfarin was held 8/16-8/18 for supratherapeutic INR, resumed at lower dose 8/19-8/21, home dose 8/22, boosted dose 8/23.  Hgb  decreased to 7.8 today-? Dilutional. Repeat Hgb 9.2, stable. Pltc WNL  Hematuria resolved, no other bleeding reported per nursing  Dysphagia diet + Ensure ordered  Goal of Therapy:  INR 2-3   Plan:   Warfarin 2 mg today, give dose early at 1400.    Daily PT/INR.   Monitor CBC and for s/sx of bleeding.   Lindell Spar, PharmD, BCPS Pager: [phone] 04/09/2017 8:03 AM

## 2017-04-09 NOTE — Progress Notes (Signed)
Nutrition Follow-up  DOCUMENTATION CODES:   Severe malnutrition in context of chronic illness  INTERVENTION:   Ensure Enlive po BID, each supplement provides 350 kcal and 20 grams of protein  NUTRITION DIAGNOSIS:   Malnutrition related to  (COPD, CHF, advanced age ) as evidenced by severe depletion of body fat, severe depletion of muscle mass, energy intake < or equal to 75% for > or equal to 1 month.  Ongoing  GOAL:   Patient will meet greater than or equal to 90% of their needs  Progressing  MONITOR:   Diet advancement, Labs, Weight trends, I & O's  REASON FOR ASSESSMENT:   Malnutrition Screening Tool    ASSESSMENT:   83 y.o. female with medical history significant of atrial fib on coumadin, chronic respiratory failure on 2.5-3L at baseline O2, chronic combined systolic and diastolic heart failure, HTN, hypothyroidism, and chronic pressure ulcers. She was evaluated in the ED a week ago for confusion and lethargy, was diagnosed with URI/COPD exacerbation as well as UTI. She was given steroids, doxycyline and keflex and sent home. Since then, she has not gotten any better. She has continued to be lethargic, not acting her normal self, weak, confused, with decreased PO intake. No reported fevers, but admits to chills, no chest pain, nausea, vomiting. She has chronic diarrhea. Developed Rt sided facial droop and dysarthria for which had STAT ct of the head that was low suspicious for stroke. Patient found to be hypotensive, transferred to the ICU and started on phenylephrine   Spoke with daughter at bedside. Pt appetite progressing past baseline. At home pt consumed bites of food, versus this admission, pt consuming half of most meals in the last couple of days. Pt drinking 1-2 ensures per day. Daughter seem very pleased. Pt to discharged to SNF. Discussed protein intake for preservation of lean body mass and wound healing post discharge with daughter. Wt noted to be stable. Will  continue with current therapy.   Medications reviewed and include: ferrous sulfate, MVI, IV abx Labs reviewed: BUN 21 (H) Calcium 8.1 (L) Anion gap 4 (L)  Diet Order:  DIET DYS 2 Room service appropriate? Yes; Fluid consistency: Thin  Skin:  Wound (see comment) (Stage II sacrum, bilateral heel wound)  Last BM:  04/07/17  Height:   Ht Readings from Last 1 Encounters:  04/04/17 5\' 9"  (1.753 m)    Weight:   Wt Readings from Last 1 Encounters:  04/09/17 139 lb 3.2 oz (63.1 kg)    Ideal Body Weight:  61.4 kg  BMI:  Body mass index is 20.56 kg/m.  Estimated Nutritional Needs:   Kcal:  1700-2000kcal/day   Protein:  91-104g/day   Fluid:  >1.7L/day   EDUCATION NEEDS:   Education needs no appropriate at this time  [place], LDN Clinical Nutrition Pager # - [phone]

## 2017-04-09 NOTE — Progress Notes (Signed)
Pt discharge to nursing home with left hearing aid in pt ear. Pt daughter at bedside, preparing pt for discharge, gave daughter pt gray blanket. SRP, RN

## 2017-04-09 NOTE — Discharge Summary (Addendum)
Physician Discharge Summary  [name] CHE:[id number] DOB: 02/15/1927 DOA: 03/31/2017  PCP: Tamsen Roers, MD  Admit date: 03/31/2017 Discharge date: 04/09/2017  Admitted From: home  Disposition:  SNF  Recommendations for Outpatient Follow-up:  1. Follow up with PCP in 1-2 weeks 2. INR check in 1 week  3. Cardiology follow up with Dr. Einar Gip in 2 weeks regarding diuretics and heart failure 4. Outpatient ABI and consider referral to vascular surgery 5. Palliative care to follow  Home Health:  PT/OT  Equipment/Devices:  3-4L [place]. See below for wound care recommendations  Discharge Condition:  Stable, improved CODE STATUS:  Full code  Diet recommendation:  Dysphagia 2 with thin liquids Liquids provided via: Cup;Straw Medication Administration: Crushed with puree Supervision: Staff to assist with self feeding;Full supervision/cueing for compensatory strategies Compensations: Slow rate;Small sips/bites Postural Changes and/or Swallow Maneuvers: Seated upright 90 degrees;Upright 30-60 min after meal  Brief/Interim Summary:  Adrienne Matusik Bowmanis a 83 y.o.femalewith medical history significant of atrial fib on coumadin, chronic respiratory failure on 2.5-3L at baseline O2, chronic combined systolic and diastolic heart failure, HTN, hypothyroidism, and chronic pressure ulcers. She was evaluated in the ED a week prior to admission for confusion and lethargy, was diagnosed with URI/COPD exacerbation as well as UTI. She was given steroids, doxycyline and keflex and sent home. She continued to be lethargic and she became more [place] of breath with labored breathing.  She returned to the ER.  Chest x-ray demonstrated pulmonary edema. She was given 20 mg of IV Lasix. During nursing evaluation, patient was seen leaning to the left, left facial droop, more difficult to arouse, unequal pupils. Rapid response was called. Patient underwent CT head stat which was unremarkable for acute etiology. During CT,  patient's blood pressure dropped to the 60s. She was given 1 L fluid bolus and transferred to ICU, PCCM was consulted, patient started on vasopressor. Neurology was consulted over the phone, they recommended MRI, MRA brain.  On morning of 8/16, symptoms had resolved. Further questioning of family found that patient has these episodes occur  chronically at home of difficult arousal and has unequal pupils at baseline due to glaucoma. She became verbal with improvement in BP.  PCCM canceled MRI/MRA as they did not feel stroke work up was necessary. Blood cultures returned with proteus and rocephin was increased to 2g IV. Sensitivity returned ESBL and antibiotics changed to meropenem and she will be given ertapenem on 8/24 to complete a full 7-day course of antibiotics.  We attempted to diurese her with lasix 20mg  po BID, but she became hypotensive and required repeated fluid boluses which improved her blood pressure.  Stopped all blood pressure medications including diuretics and will plan to have her cardiologist address these medications as an outpatient.    Discharge Diagnoses:  Principal Problem:   Acute metabolic encephalopathy Active Problems:   UTI (urinary tract infection)   Pressure injury of skin   Protein-calorie malnutrition, severe   Labored breathing  Acute toxic encephalopathy, improved with treatment of her UTI/bacteremia and correction of her hypotension -Treat infection as below  Hypotension recurred.  I suspect that this was due to recent diuresis and resumption of gabapentin which seems to sedate her and cause hypotension.  -  Cortisol level appropriately high  -  IV fluid boluses improved blood pressure -  discontinued spironolactone and gabapentin  Septic shock secondary to ESBL proteus UTI (POA) and ESBL proteus bacteremia.  Briefly required ICU care with vasopressor support. -Failed outpatient Keflex -  Urine culture with ESBL proteus mirabilis  -Blood culture with ESBL  proteus mirabilis  -Continued meropenem and gave a dose of ertapenem on 8/24 to complete a 7-day course of abx for ESBL infection -Off vasopressors since . Cortisol 39.2.   Acute neurologic change 8/15-8/16, likely due to severe hypotension and resolved when her BP improved.   -Left facial droop, unequal pupils (per daughter, patient's pupils are unequal at baseline -CT head without acute etiology - patient does not tolerate MRI   -EEG showed diffuse cerebral dysfunction, but no epileptiform activity - Mentation back to baseline   Acute on chronic systolic and diastolic heart failure, EF 30-35%.   -CXR 8/20 without significant interval change in bilateral pleural effusions, persistent pulmonary venous congestion.  Findings on CXR were likely related to some cardiac stunning from septic shock and hypotension.  We attempted gentle diuresis once her BP improved, but she quickly became hypotensive to the 64Q systolic and required multiple fluid boluses to improve her BP.  She is currently on 4L [place] and will need close outpatient follow up with cardiology  Mild AKI, creatinine peaked at 1.45, now back to baseline of 1, resolved. -Renal US unremarkable, limited exam due to positioning  Chronic atrial fibrillation, INR subtherapeutic -  Resume warfarin 1mg  night.  Will need INR check in 5-7 days  Chronic respiratory failure -2.5-3L [place] O2 at baseline   Chronic pressure ulcers on her coccyx, bilateral heels, right lateral malleoli, did not appear infected -Turn every 2 hours -Wound RN:  Prevalon boots.   1.  RIGHT lateral ankle:  Cleanse with NS, pat gently dry. Cover with small piece of silver hydrofiber (Aquacel Ag+, Lawson # [id number]) cut to fit wound and top with a dry gauze dressing.  Secure with Kerlix roll gauze (include heel in wrapping) and place foot into [employer]. 2.  Wound care to right heel:  Cleanse with NS, apply a 1/8 inch layer of collagenase to the wound bed. Top with  saline moistened gauze 2x2, then top with dry gauze 2x2. Wrap with Kerlix roll gauze and secure with paper tape. 3.  Wound care to left heel resolving partial thickness blister:  Cleanse with NS, and pat dry.  Moisturize with moisturizer twice daily prior to placing foot into [employer].. 4.  Wound care to sacral Unstageable pressure injury:  Cleanse with NS, pat gently dry.  Apply 1/8 inch layer of collagenase ointment to wound, top with saline moistened gauze 2x2.  Cover with dry gauze 2x2, then top with silicone foam dressing for sacrum.  Turn and reposition from side to side and avoid the supine position. - needs outpatient ABIs  Hypertension -Hold antihypertensives due to low BP. Monitor BP.   Hypothyroidism -Continue Synthroid  Hematuria -In setting of UTI and coumadin use. Resolved.  Severe protein calorie malnutrition -  Supplements ordered -  Nutrition consult appreciated  Goals of care - Palliative care medicine. Remains full code, but will have palliative care continue to follow as outpatient  Discharge Instructions     Medication List    STOP taking these medications   gabapentin 600 MG tablet Commonly known as:  NEURONTIN   guaiFENesin 600 MG 12 hr tablet Commonly known as:  MUCINEX   lisinopril 5 MG tablet Commonly known as:  PRINIVIL,ZESTRIL   Lutein 6 MG Caps   methocarbamol 500 MG tablet Commonly known as:  ROBAXIN   metoprolol tartrate 25 MG tablet Commonly known as:  LOPRESSOR   oxyCODONE-acetaminophen 5-325  MG tablet Commonly known as:  PERCOCET/ROXICET   spironolactone 25 MG tablet Commonly known as:  ALDACTONE     TAKE these medications   albuterol (2.5 MG/3ML) 0.083% nebulizer solution Commonly known as:  PROVENTIL Take 2.5 mg by nebulization every 4 (four) hours as needed for wheezing or shortness of breath.   collagenase ointment Commonly known as:  SANTYL Apply topically daily.   dorzolamide 2 % ophthalmic  solution Commonly known as:  TRUSOPT Place 1 drop into the left eye 2 (two) times daily.   ergocalciferol 50000 units capsule Commonly known as:  VITAMIN D2 Take 50,000 Units by mouth every Thursday.   feeding supplement (ENSURE ENLIVE) Liqd Take 237 mLs by mouth 2 (two) times daily between meals.   ferrous sulfate 325 (65 FE) MG tablet Take 1 tablet (325 mg total) by mouth daily with breakfast.   FLUoxetine 40 MG capsule Commonly known as:  PROZAC Take 40 mg by mouth daily.   levothyroxine 75 MCG tablet Commonly known as:  SYNTHROID, LEVOTHROID Take 75 mcg by mouth daily before breakfast.   loperamide 2 MG capsule Commonly known as:  IMODIUM Take 1 capsule (2 mg total) by mouth daily as needed for diarrhea or loose stools. What changed:  how much to take  when to take this  reasons to take this  additional instructions   mirtazapine 7.5 MG tablet Commonly known as:  REMERON Take 7.5 mg by mouth every evening.   omeprazole 40 MG capsule Commonly known as:  PRILOSEC Take 40 mg by mouth 2 (two) times daily.   OXYGEN Inhale 2.5-3 L into the lungs daily. Continuous   pilocarpine 4 % ophthalmic solution Commonly known as:  PILOCAR Place 1 drop into the left eye 2 (two) times daily.   rOPINIRole 1 MG tablet Commonly known as:  REQUIP Take 1 mg by mouth every evening.   timolol 0.5 % ophthalmic solution Commonly known as:  TIMOPTIC Place 1 drop into the left eye 2 (two) times daily.   travoprost (benzalkonium) 0.004 % ophthalmic solution Commonly known as:  TRAVATAN Place 1 drop into the left eye at bedtime.   warfarin 2 MG tablet Commonly known as:  COUMADIN Take 1 mg by mouth daily with supper.       Contact information for follow-up providers    Little, James, MD. Schedule an appointment as soon as possible for a visit in 1 week(s).   Specialty:  Family Medicine Contact information: [address] [address] [phone]        Adrian Prows, MD. Schedule an appointment as soon as possible for a visit in 2 week(s).   Specialty:  Cardiology Contact information: [place] [address] [phone]            Contact information for after-discharge care    Destination    [facility] Follow up.   Specialty:  Skilled Nursing Facility Contact information: [place] [phone]                 No Known Allergies  Consultants:  PCCM  Neurology over the phone 8/15   Cardiology over the phone 8/16  Palliative care     Procedures/Studies: Dg Chest 2 View  Addendum Date: 03/21/2017   ADDENDUM REPORT: 03/21/2017 14:53 ADDENDUM: Chronic compression fracture noted within the lower thoracic spine. Stable kyphosis of the thoracolumbar spine. No acute or suspicious osseous  finding. Electronically Signed   By: Franki Cabot M.D.   On: 03/21/2017 14:53   Result Date: 03/21/2017 CLINICAL DATA:  Shortness of breath. History of aspiration pneumonia. EXAM: CHEST  2 VIEW COMPARISON:  Chest x-ray dated 10/14/2016. FINDINGS: Chronic bibasilar opacities, stable, some component likely due to chronic pleural effusions and associated atelectasis. No new lung findings. Cardiomediastinal silhouette appears grossly stable. Atherosclerotic changes noted at the aortic arch. IMPRESSION: Stable chest x-ray. Chronic opacities each lung base, stable, again most likely a combination of pleural effusions and atelectasis. No acute findings. Aortic atherosclerosis. Cardiomegaly. Electronically Signed: By: Franki Cabot M.D. On: 03/21/2017 14:49   US Renal  Result Date: 04/02/2017 CLINICAL DATA:  Acute kidney injury. Urinary tract infection without hematuria. EXAM: RENAL / URINARY TRACT ULTRASOUND COMPLETE COMPARISON:  CT 01/05/2008 FINDINGS: Right Kidney: Length: 8.7 cm. There is thinning of the renal parenchyma with increased echogenicity. No renal  fluid collection. No mass or hydronephrosis visualized. Left Kidney: Not visualized due to technical factors, patient had significant difficulty with positioning. Bladder: Nondistended and not well evaluated. IMPRESSION: 1. Findings consistent with chronic medical renal disease of the right kidney. No hydronephrosis. 2. Left kidney not visualized due to technical factors, limited patient positioning. 3. Decompressed urinary bladder. Electronically Signed   By: Jeb Levering M.D.   On: 04/02/2017 20:29   Dg Chest Port 1 View  Result Date: 04/06/2017 CLINICAL DATA:  PICC line placement EXAM: PORTABLE CHEST 1 VIEW COMPARISON:  04/04/2017 FINDINGS: Right PICC line in place with the tip in the mid right atrium approximately 3 cm below the cavoatrial junction. Cardiomegaly with vascular congestion. Bilateral lower lobe atelectasis or infiltrates with small layering effusions, not significantly changed since prior study. IMPRESSION: Right PICC line tip in the mid right atrium approximately 3 cm below the cavoatrial junction. Stable layering effusions with bibasilar atelectasis or infiltrates. Electronically Signed   By: Rolm Baptise M.D.   On: 04/06/2017 09:06   Dg Chest Port 1 View  Result Date: 04/04/2017 CLINICAL DATA:  Weakness and congestion. EXAM: PORTABLE CHEST 1 VIEW COMPARISON:  April 02, 2017 FINDINGS: Bilateral pleural effusions and underlying atelectasis. No pneumothorax. Stable cardiomediastinal silhouette. Stable right PICC line. Pulmonary venous congestion without overt edema. IMPRESSION: No significant interval change in the bilateral pleural effusions and underlying atelectasis. Persistent pulmonary venous congestion without overt edema. Electronically Signed   By: Dorise Bullion III M.D   On: 04/04/2017 07:47   Dg Chest Port 1 View  Result Date: 04/02/2017 CLINICAL DATA:  Follow-up pleural effusions EXAM: PORTABLE CHEST 1 VIEW COMPARISON:  04/01/2017 FINDINGS: Right-sided PICC line is  again seen. Bilateral pleural effusions right greater than left are noted. Bibasilar airspace density is again seen. Aortic calcifications are again noted. No acute bony abnormality is seen. IMPRESSION: Stable bibasilar changes. Electronically Signed   By: Inez Catalina M.D.   On: 04/02/2017 06:57   Dg Chest Port 1 View  Result Date: 04/01/2017 CLINICAL DATA:  Central line placement EXAM: PORTABLE CHEST 1 VIEW COMPARISON:  04/01/2017 FINDINGS: Right upper extremity catheter has been placed, the tip projects over the low right atrium. Bilateral pleural effusions left greater than right. Cardiomegaly with central vascular congestion and perihilar edema. No change in left greater than right bibasilar consolidation. Aortic atherosclerosis. IMPRESSION: 1. Right upper extremity catheter tip overlies the low right atrium 2. Left greater than right pleural effusions. Continued cardiomegaly with vascular congestion and perihilar edema. No change in bibasilar airspace disease. Electronically Signed  By: Donavan Foil M.D.   On: 04/01/2017 21:13   Dg Chest Port 1 View  Result Date: 04/01/2017 CLINICAL DATA:  Shortness of breath EXAM: PORTABLE CHEST 1 VIEW COMPARISON:  03/31/2017 FINDINGS: Increased bilateral pleural effusions, left greater than right. Cardiomegaly with central congestion and hazy pulmonary opacities suggesting edema. Bibasilar atelectasis or pneumonia. Aortic atherosclerosis. IMPRESSION: Worsened bilateral pleural effusions. Cardiomegaly with increased vascular congestion and pulmonary edema. Increased bibasilar opacity may reflect atelectasis or pneumonia. Electronically Signed   By: Donavan Foil M.D.   On: 04/01/2017 03:34   Dg Chest Port 1 View  Result Date: 03/31/2017 CLINICAL DATA:  Pulmonary edema.  Fatigue and decreased appetite. EXAM: PORTABLE CHEST 1 VIEW COMPARISON:  Frontal and lateral views 03/21/2017 FINDINGS: Unchanged cardiomegaly with tortuous atherosclerotic thoracic aorta.  Decreased pulmonary edema from prior exam. Decreased bilateral pleural effusions with persistent small volume pleural fluid bilaterally. Likely associated compressive atelectasis. No new focal airspace disease. The bones are under mineralized. IMPRESSION: 1. Decreased pulmonary edema and pleural effusions from prior exam. 2. Stable cardiomegaly and tortuous atherosclerotic thoracic aorta. Electronically Signed   By: Jeb Levering M.D.   On: 03/31/2017 20:21   Ct Head Code Stroke Wo Contrast  Result Date: 04/01/2017 CLINICAL DATA:  Code stroke. Unresponsive. History of atrial fibrillation. EXAM: CT HEAD WITHOUT CONTRAST TECHNIQUE: Contiguous axial images were obtained from the base of the skull through the vertex without intravenous contrast. COMPARISON:  None available, examination interpretedduring PACS downtime. FINDINGS: BRAIN: No intraparenchymal hemorrhage, mass effect nor midline shift. The ventricles and sulci are normal for age. Confluent supratentorial white matter hypodensities. Chronic appearing bilateral basal ganglia lacunar infarcts. Mild RIGHT cerebral peduncle volume loss compatible little wallerian degeneration. No acute large vascular territory infarcts. No abnormal extra-axial fluid collections. Basal cisterns are patent. VASCULAR: Moderate calcific atherosclerosis of the carotid siphons. SKULL: No skull fracture. No significant scalp soft tissue swelling. SINUSES/ORBITS: The mastoid air-cells and included paranasal sinuses are well-aerated.The included ocular globes and orbital contents are non-suspicious. Status post bilateral ocular lens implants. OTHER: Patient is edentulous. ASPECTS [facility] Stroke Program Early CT Score) - Ganglionic level infarction (caudate, lentiform nuclei, internal capsule, insula, M1-M3 cortex): 7 - Supraganglionic infarction (M4-M6 cortex): 3 Total score (0-10 with 10 being normal): 10 IMPRESSION: 1. No acute intracranial process. 2. Moderate to severe chronic  small vessel ischemic disease. Chronic appearing lacunar infarcts. 3. ASPECTS is 10. 4. Critical Value/emergent results were called by telephone at the time of interpretation on 04/01/2017 at 1:30 am to Dr. Cheral Marker, Neurology, who verbally acknowledged these results. Electronically Signed   By: Elon Alas M.D.   On: 04/01/2017 01:34   Procedures:  PICC replaced on 8/21 and removed on 8/24 prior to discharge  Subjective: Denies chest pains, SOB, nausea, abdominal pain.  Right foot is feeling better today.    Discharge Exam: Vitals:   04/08/17 2105 04/09/17 0449  BP: (!) 106/50 (!) 108/58  Pulse: 75 73  Resp: 20 18  Temp: 98.6 F (37 C) 98.1 F (36.7 C)  SpO2: 95% 91%   Vitals:   04/08/17 1315 04/08/17 1755 04/08/17 2105 04/09/17 0449  BP: (!) 83/37 124/68 (!) 106/50 (!) 108/58  Pulse: 75  75 73  Resp: 20  20 18   Temp: 98.3 F (36.8 C)  98.6 F (37 C) 98.1 F (36.7 C)  TempSrc: Oral  Oral Oral  SpO2: 98%  95% 91%  Weight:    63.1 kg (139 lb 3.2 oz)  Height:  General: Pt is alert, awake, not in acute distress, unequal pupils (chronic) Cardiovascular: IRRR, S1/S2 +, no rubs, no gallops Respiratory: CTA bilaterally, no wheezing, no rhonchi Abdominal: Soft, NT, ND, bowel sounds + Extremities: no edema, no cyanosis Neuro:  4/5 strength upper extremities, 3/5 strength bilateral lower extremities.   Ulcers not observed today    The results of significant diagnostics from this hospitalization (including imaging, microbiology, ancillary and laboratory) are listed below for reference.     Microbiology: Recent Results (from the past 240 hour(s))  Urine culture     Status: Abnormal   Collection Time: 03/31/17  2:49 PM  Result Value Ref Range Status   Specimen Description URINE, CLEAN CATCH  Final   Special Requests NONE  Final   Culture (A)  Final    >=100,000 COLONIES/mL PROTEUS MIRABILIS Susceptibility Pattern Suggests Possibility of an Extended Spectrum Beta  Lactamase Producer. Contact Laboratory Within 7 Days if Confirmation Warranted. Performed at [facility], [place] [address] [place], [place] [address]    Report Status 04/03/2017 FINAL  Final   Organism ID, Bacteria PROTEUS MIRABILIS (A)  Final      Susceptibility   Proteus mirabilis - MIC*    AMPICILLIN >=32 RESISTANT Resistant     CEFAZOLIN >=64 RESISTANT Resistant     CEFTRIAXONE >=64 RESISTANT Resistant     CIPROFLOXACIN >=4 RESISTANT Resistant     GENTAMICIN <=1 SENSITIVE Sensitive     IMIPENEM 2 SENSITIVE Sensitive     NITROFURANTOIN 128 RESISTANT Resistant     TRIMETH/SULFA >=320 RESISTANT Resistant     AMPICILLIN/SULBACTAM 8 SENSITIVE Sensitive     PIP/TAZO <=4 SENSITIVE Sensitive     * >=100,000 COLONIES/mL PROTEUS MIRABILIS  Blood culture (routine x 2)     Status: Abnormal   Collection Time: 03/31/17  6:26 PM  Result Value Ref Range Status   Specimen Description BLOOD LEFT ANTECUBITAL  Final   Special Requests   Final    IN PEDIATRIC BOTTLE Blood Culture results may not be optimal due to an excessive volume of blood received in culture bottles   Culture  Setup Time   Final    GRAM NEGATIVE RODS IN PEDIATRIC BOTTLE CRITICAL RESULT CALLED TO, READ BACK BY AND VERIFIED WITH: PHARMD N [place] [id number] MLM    Culture (A)  Final    PROTEUS MIRABILIS Susceptibility Pattern Suggests Possibility of an Extended Spectrum Beta Lactamase Producer. Contact Laboratory Within 7 Days if Confirmation Warranted. Performed at [facility], [place] [address], [place], [place] [address]    Report Status 04/03/2017 FINAL  Final   Organism ID, Bacteria PROTEUS MIRABILIS  Final      Susceptibility   Proteus mirabilis - MIC*    AMPICILLIN >=32 RESISTANT Resistant     CEFAZOLIN >=64 RESISTANT Resistant     CEFEPIME RESISTANT Resistant     CEFTAZIDIME RESISTANT Resistant     CEFTRIAXONE >=64 RESISTANT Resistant     CIPROFLOXACIN >=4 RESISTANT Resistant     GENTAMICIN <=1  SENSITIVE Sensitive     IMIPENEM 1 SENSITIVE Sensitive     TRIMETH/SULFA >=320 RESISTANT Resistant     AMPICILLIN/SULBACTAM 8 SENSITIVE Sensitive     PIP/TAZO <=4 SENSITIVE Sensitive     * PROTEUS MIRABILIS  Blood Culture ID Panel (Reflexed)     Status: Abnormal   Collection Time: 03/31/17  6:26 PM  Result Value Ref Range Status   Enterococcus species NOT DETECTED NOT DETECTED Final   Vancomycin resistance  NOT DETECTED NOT DETECTED Final   Listeria monocytogenes NOT DETECTED NOT DETECTED Final   Staphylococcus species NOT DETECTED NOT DETECTED Final   Staphylococcus aureus NOT DETECTED NOT DETECTED Final   Methicillin resistance NOT DETECTED NOT DETECTED Final   Streptococcus species NOT DETECTED NOT DETECTED Final   Streptococcus agalactiae NOT DETECTED NOT DETECTED Final   Streptococcus pneumoniae NOT DETECTED NOT DETECTED Final   Streptococcus pyogenes NOT DETECTED NOT DETECTED Final   Acinetobacter baumannii NOT DETECTED NOT DETECTED Final   Enterobacteriaceae species DETECTED (A) NOT DETECTED Final    Comment: CRITICAL RESULT CALLED TO, READ BACK BY AND VERIFIED WITH: PHARMD N GLOGOVAC [id number] MLM    Enterobacter cloacae complex NOT DETECTED NOT DETECTED Final   Escherichia coli NOT DETECTED NOT DETECTED Final   Klebsiella oxytoca NOT DETECTED NOT DETECTED Final   Klebsiella pneumoniae NOT DETECTED NOT DETECTED Final   Proteus species DETECTED (A) NOT DETECTED Final    Comment: CRITICAL RESULT CALLED TO, READ BACK BY AND VERIFIED WITH: PHARMD N GLOGOVAC [id number] MLM    Serratia marcescens NOT DETECTED NOT DETECTED Final   Carbapenem resistance NOT DETECTED NOT DETECTED Final   Haemophilus influenzae NOT DETECTED NOT DETECTED Final   Neisseria meningitidis NOT DETECTED NOT DETECTED Final   Pseudomonas aeruginosa NOT DETECTED NOT DETECTED Final   Candida albicans NOT DETECTED NOT DETECTED Final   Candida glabrata NOT DETECTED NOT DETECTED Final   Candida krusei NOT  DETECTED NOT DETECTED Final   Candida parapsilosis NOT DETECTED NOT DETECTED Final   Candida tropicalis NOT DETECTED NOT DETECTED Final    Comment: Performed at [facility], [address], [place], [place] [address]  Blood culture (routine x 2)     Status: Abnormal   Collection Time: 03/31/17  6:43 PM  Result Value Ref Range Status   Specimen Description BLOOD RIGHT ARM  Final   Special Requests   Final    IN PEDIATRIC BOTTLE Blood Culture results may not be optimal due to an excessive volume of blood received in culture bottles   Culture  Setup Time   Final    GRAM NEGATIVE RODS IN PEDIATRIC BOTTLE CRITICAL VALUE NOTED.  VALUE IS CONSISTENT WITH PREVIOUSLY REPORTED AND CALLED VALUE.    Culture (A)  Final    PROTEUS MIRABILIS SUSCEPTIBILITIES PERFORMED ON PREVIOUS CULTURE WITHIN THE LAST 5 DAYS. Performed at [facility], [place] [address] [place], [place] [address]    Report Status 04/03/2017 FINAL  Final  MRSA PCR Screening     Status: None   Collection Time: 04/01/17  4:32 AM  Result Value Ref Range Status   MRSA by PCR NEGATIVE NEGATIVE Final    Comment:        The GeneXpert MRSA Assay (FDA approved for NASAL specimens only), is one component of a comprehensive MRSA colonization surveillance program. It is not intended to diagnose MRSA infection nor to guide or monitor treatment for MRSA infections.      Labs: BNP (last 3 results)  Recent Labs  10/14/16 0416 10/15/16 0439 03/21/17 1455  BNP 281.9* 477.4* 308.6*   Basic Metabolic Panel:  Recent Labs Lab 04/03/17 0337  04/05/17 0724 04/06/17 1147 04/07/17 0356 04/08/17 0408 04/09/17 0342  NA 139  < > 139 139 138 137 135  K 3.3*  < > 3.2* 3.6 3.8 4.4 4.3  CL 107  < > 98* 97* 98* 97* 102  CO2 22  < > 32 34*  34* 34* 29  GLUCOSE 110*  < > 81 121* 85 90 76  BUN 26*  < > 16 18 21* 25* 21*  CREATININE 1.32*  < > 1.03* 0.98 1.10* 1.00 0.88  CALCIUM 8.2*  < > 8.4* 8.8* 8.5* 8.8* 8.1*  MG 2.3  --    --   --   --   --   --   < > = values in this interval not displayed. CBC:  Recent Labs Lab 04/03/17 0337 04/04/17 0400 04/05/17 0724 04/06/17 1147 04/07/17 0356 04/08/17 0408 04/09/17 0342 04/09/17 1045  WBC 18.4* 15.0* 10.5 9.5 9.4 9.7 8.8 10.3  NEUTROABS 17.2* 13.6* 9.1* 8.0* 7.8*  --   --   --   HGB 8.9* 8.6* 10.4* 9.7* 8.9* 9.0* 7.8* 9.2*  HCT 26.1* 25.4* 30.6* 30.4* 26.7* 27.2* 23.5* 28.3*  MCV 91.3 90.4 89.7 91.8 93.0 93.2 94.0 94.0  PLT 98* 140* 159 182 164 191 192 202    Anemia work up No results for input(s): VITAMINB12, FOLATE, FERRITIN, TIBC, IRON, RETICCTPCT in the last 72 hours. Urinalysis    Component Value Date/Time   COLORURINE RED (A) 03/31/2017 1449   APPEARANCEUR TURBID (A) 03/31/2017 1449   LABSPEC  03/31/2017 1449    TEST NOT REPORTED DUE TO COLOR INTERFERENCE OF URINE PIGMENT   PHURINE  03/31/2017 1449    TEST NOT REPORTED DUE TO COLOR INTERFERENCE OF URINE PIGMENT   GLUCOSEU (A) 03/31/2017 1449    TEST NOT REPORTED DUE TO COLOR INTERFERENCE OF URINE PIGMENT   HGBUR (A) 03/31/2017 1449    TEST NOT REPORTED DUE TO COLOR INTERFERENCE OF URINE PIGMENT   BILIRUBINUR (A) 03/31/2017 1449    TEST NOT REPORTED DUE TO COLOR INTERFERENCE OF URINE PIGMENT   KETONESUR (A) 03/31/2017 1449    TEST NOT REPORTED DUE TO COLOR INTERFERENCE OF URINE PIGMENT   PROTEINUR (A) 03/31/2017 1449    TEST NOT REPORTED DUE TO COLOR INTERFERENCE OF URINE PIGMENT   UROBILINOGEN 1.0 11/22/2010 0949   NITRITE (A) 03/31/2017 1449    TEST NOT REPORTED DUE TO COLOR INTERFERENCE OF URINE PIGMENT   LEUKOCYTESUR (A) 03/31/2017 1449    TEST NOT REPORTED DUE TO COLOR INTERFERENCE OF URINE PIGMENT   Sepsis Labs Invalid input(s): PROCALCITONIN,  WBC,  LACTICIDVEN   Time coordinating discharge: Over 30 minutes  SIGNED:   Janece Canterbury, MD  [facility] 04/09/2017, 2:34 PM Pager   If 7PM-7AM, please contact night-coverage [email or web address] Password [email or web address]

## 2017-04-09 NOTE — Clinical Social Work Placement (Signed)
Pt to discharge today to transfer to [facility]. Report [id number] All information provided to facility via the [facility]. Pt will transport via [facility] [place] completed medical necessity form and arranged transportation. Daughter [name] at bedside- aware and agrees to plan.  See below for placement details  CLINICAL SOCIAL WORK PLACEMENT  NOTE  Date:  04/09/2017  Patient Details  Name: [name] MRN: [id number] Date of Birth: Dec 28, 1926  Clinical Social Work is seeking post-discharge placement for this patient at the [place] level of care (*CSW will initial, date and re-position this form in  chart as items are completed):  Yes   Patient/family provided with [facility] Work Department's list of facilities offering this level of care within the geographic area requested by the patient (or if unable, by the patient's family).  Yes   Patient/family informed of their freedom to choose among providers that offer the needed level of care, that participate in Medicare, Medicaid or managed care program needed by the patient, have an available bed and are willing to accept the patient.  Yes   Patient/family informed of [facility]'s ownership interest in [facility] and [facility], as well as of the fact that they are under no obligation to receive care at these facilities.  PASRR submitted to EDS on       PASRR number received on       Existing PASRR number confirmed on 04/08/17     FL2 transmitted to all facilities in geographic area requested by pt/family on 04/08/17     FL2 transmitted to all facilities within larger geographic area on       Patient informed that his/her managed care company has contracts with or will negotiate with certain facilities, including the following:        Yes   Patient/family informed of bed offers received.  Patient chooses bed at [place], [place]     Physician recommends and patient  chooses bed at [place], [place]    Patient to be transferred to [facility], [place] on 04/09/17.  Patient to be transferred to facility by [facility]     Patient family notified on 04/09/17 of transfer.  Name of family member notified:  daughter [name]     PHYSICIAN       Additional Comment:    _______________________________________________ Nila Nephew, LCSW 04/09/2017, 2:25 PM [phone]

## 2017-04-09 NOTE — Evaluation (Signed)
Occupational Therapy Evaluation Patient Details Name: [name] MRN: [id number] DOB: Aug 05, 1927 Today's Date: 04/09/2017    History of Present Illness HPI: 83 y.o.femalewith medical history significant for atrial fib on coumadin, chronic respiratory failure, chronic combined systolic and diastolic heart failure, HTN, hypothyroidism, and chronic pressure ulcers. She was evaluated in the ED a week ago for confusion and lethargy, was diagnosed with URI/COPD exacerbation as well as UTI.  Since then, she has not gotten any better. She has continued to be lethargic, not acting her normal self, weak, confused, with decreased PO intake. At baseline, she does not ambulate. She is able to assist in transferring to motorized wheelchair. Dx acute metabolic encephalopathy, UTI, septic shock.   Clinical Impression   Pt was admitted for the above. She is much weaker than baseline and will benefit from continued OT in acute setting Pt currently needs max to total +2 assist for adls. Goals are for mod +1-2 assist in acute.      Follow Up Recommendations  SNF    Equipment Recommendations   (? drop arm commode)    Recommendations for Other Services       Precautions / Restrictions Precautions Precautions: Fall Precaution Comments: heel pressure and sacral wounds so be careful with scooting and time up in the chair (rotating sides) Required Braces or Orthoses: Other Brace/Splint Other Brace/Splint: bilateral soft pressure relief boots Restrictions Other Position/Activity Restrictions: pt typically does scoot transfers:  has been non-ambulatory for years      Mobility Bed Mobility     Rolling: Max assist   Supine to sit: Max assist Sit to supine: Total assist   General bed mobility comments: cues to use bedrails to assist  Transfers                 General transfer comment: not performed    Balance     Sitting balance-Leahy Scale:  (fair to poor) Sitting balance -  Comments: 10 minutes, posterior lean when trying to sit up more erect.  Min guard to min A.  Intermittent assistance due to getting fatiqued                                   ADL either performed or assessed with clinical judgement   ADL Overall ADL's : Needs assistance/impaired Eating/Feeding: Maximal assistance;Bed level   Grooming: Maximal assistance;Bed level   Upper Body Bathing: Total assistance;Bed level   Lower Body Bathing: Total assistance;+2 for physical assistance;Bed level   Upper Body Dressing : Total assistance;Bed level   Lower Body Dressing: Total assistance;Bed level;+2 for physical assistance                 General ADL Comments: Pt very weak; not at baseline.  Sat EOB x 10 minutes. Very kyphotic. When she tried to sit up more erect, she leaned posteriorly. Assisted NT with cleaning pt up, changing gown and changing bed.  Pt with reddened skin on upper thighs     Vision         Perception     Praxis      Pertinent Vitals/Pain Pain Assessment: Faces Faces Pain Scale: Hurts little more Pain Location: R foot (on lateral ankle area where ulcers are)  is painful even at rest and has very little Active movement.  Pain Descriptors / Indicators: Aching Pain Intervention(s): Limited activity within patient's tolerance;Monitored during session;Repositioned     Hand  Dominance     Extremity/Trunk Assessment Upper Extremity Assessment Upper Extremity Assessment: Generalized weakness (grossly 3-/5)           Communication Communication Communication: HOH   Cognition Arousal/Alertness: Awake/alert Behavior During Therapy: WFL for tasks assessed/performed Overall Cognitive Status: Within Functional Limits for tasks assessed                                     General Comments       Exercises     Shoulder Instructions      Home Living Family/patient expects to be discharged to:: Skilled nursing facility                              Home Equipment: Youth worker - 2 wheels;Shower seat - built in;Bedside commode;Hospital bed   Additional Comments: Pts son is one of her primary caregivers; he was recently hospitalized for back sx, and the daughter has injury from helping to lift and care for her mother as well. She also has a lift chair       Prior Functioning/Environment Level of Independence: Needs assistance    ADL's / Homemaking Assistance Needed: pt does self feeding and grooming and helps some with UB adls            OT Problem List: Decreased strength;Decreased activity tolerance;Impaired balance (sitting and/or standing);Pain      OT Treatment/Interventions: Self-care/ADL training;Therapeutic exercise;Therapeutic activities;Patient/family education;Balance training    OT Goals(Current goals can be found in the care plan section) Acute Rehab OT Goals Patient Stated Goal: I want to go home  OT Goal Formulation: With patient/family Time For Goal Achievement: 04/23/17 Potential to Achieve Goals: Good  OT Frequency: Min 2X/week   Barriers to D/C:            Co-evaluation              AM-PAC PT "6 Clicks" Daily Activity     Outcome Measure Help from another person eating meals?: A Lot Help from another person taking care of personal grooming?: A Lot Help from another person toileting, which includes using toliet, bedpan, or urinal?: Total Help from another person bathing (including washing, rinsing, drying)?: Total Help from another person to put on and taking off regular upper body clothing?: Total Help from another person to put on and taking off regular lower body clothing?: Total 6 Click Score: 8   End of Session    Activity Tolerance: Patient tolerated treatment well Patient left: in bed;with call bell/phone within reach;with family/visitor present  OT Visit Diagnosis: Muscle weakness (generalized) (M62.81)                Time: 4627-0350 OT  Time Calculation (min): 48 min Charges:  OT General Charges $OT Visit: 1 Procedure OT Evaluation $OT Eval Low Complexity: 1 Procedure OT Treatments $Self Care/Home Management : 8-22 mins $Therapeutic Activity: 8-22 mins G-Codes:     Lesle Chris, OTR/L [phone] 04/09/2017  [name] 04/09/2017, 12:34 PM

## 2017-04-09 NOTE — Progress Notes (Signed)
PT Cancellation Note  Patient Details Name: [name] MRN: [id number] DOB: May 01, 1927   Cancelled Treatment:     pt working with OT this morning.  Pt has been evaluated by LPT.  Will check back later as schedule permits.    Rica Koyanagi  PTA [facility]  Acute  Rehab Pager      [phone]

## 2017-04-09 NOTE — Progress Notes (Signed)
Pt has been accepted to [facility] for SNF (rehab, wound care, IV antibiotics). Informed daughter and pt. Will follow and assist with transfer at DC.  Sharren Bridge, MSW, LCSW Clinical Social Work 04/09/2017 [phone]

## 2017-04-13 ENCOUNTER — Ambulatory Visit: Payer: Medicare Other | Admitting: Internal Medicine

## 2017-04-13 DIAGNOSIS — L89150 Pressure ulcer of sacral region, unstageable: Secondary | ICD-10-CM | POA: Diagnosis not present

## 2017-04-13 DIAGNOSIS — L89610 Pressure ulcer of right heel, unstageable: Secondary | ICD-10-CM | POA: Diagnosis not present

## 2017-04-13 DIAGNOSIS — L89510 Pressure ulcer of right ankle, unstageable: Secondary | ICD-10-CM | POA: Diagnosis not present

## 2017-04-13 DIAGNOSIS — L89621 Pressure ulcer of left heel, stage 1: Secondary | ICD-10-CM | POA: Diagnosis not present

## 2017-04-15 DIAGNOSIS — I5042 Chronic combined systolic (congestive) and diastolic (congestive) heart failure: Secondary | ICD-10-CM | POA: Diagnosis not present

## 2017-04-15 DIAGNOSIS — Z7189 Other specified counseling: Secondary | ICD-10-CM | POA: Diagnosis not present

## 2017-04-15 DIAGNOSIS — I482 Chronic atrial fibrillation: Secondary | ICD-10-CM | POA: Diagnosis not present

## 2017-04-20 DIAGNOSIS — L89510 Pressure ulcer of right ankle, unstageable: Secondary | ICD-10-CM | POA: Diagnosis not present

## 2017-04-20 DIAGNOSIS — L89621 Pressure ulcer of left heel, stage 1: Secondary | ICD-10-CM | POA: Diagnosis not present

## 2017-04-20 DIAGNOSIS — L89610 Pressure ulcer of right heel, unstageable: Secondary | ICD-10-CM | POA: Diagnosis not present

## 2017-04-20 DIAGNOSIS — L89150 Pressure ulcer of sacral region, unstageable: Secondary | ICD-10-CM | POA: Diagnosis not present

## 2017-04-26 DIAGNOSIS — G894 Chronic pain syndrome: Secondary | ICD-10-CM | POA: Diagnosis not present

## 2017-04-27 DIAGNOSIS — L89514 Pressure ulcer of right ankle, stage 4: Secondary | ICD-10-CM | POA: Diagnosis not present

## 2017-04-27 DIAGNOSIS — L89150 Pressure ulcer of sacral region, unstageable: Secondary | ICD-10-CM | POA: Diagnosis not present

## 2017-04-27 DIAGNOSIS — L89610 Pressure ulcer of right heel, unstageable: Secondary | ICD-10-CM | POA: Diagnosis not present

## 2017-04-27 DIAGNOSIS — L89621 Pressure ulcer of left heel, stage 1: Secondary | ICD-10-CM | POA: Diagnosis not present

## 2017-04-29 DIAGNOSIS — I4891 Unspecified atrial fibrillation: Secondary | ICD-10-CM | POA: Diagnosis not present

## 2017-04-29 DIAGNOSIS — I5032 Chronic diastolic (congestive) heart failure: Secondary | ICD-10-CM | POA: Diagnosis not present

## 2017-04-29 DIAGNOSIS — R41841 Cognitive communication deficit: Secondary | ICD-10-CM | POA: Diagnosis not present

## 2017-04-29 DIAGNOSIS — L89613 Pressure ulcer of right heel, stage 3: Secondary | ICD-10-CM | POA: Diagnosis not present

## 2017-04-29 DIAGNOSIS — J441 Chronic obstructive pulmonary disease with (acute) exacerbation: Secondary | ICD-10-CM | POA: Diagnosis not present

## 2017-04-29 DIAGNOSIS — I110 Hypertensive heart disease with heart failure: Secondary | ICD-10-CM | POA: Diagnosis not present

## 2017-05-04 DIAGNOSIS — I110 Hypertensive heart disease with heart failure: Secondary | ICD-10-CM | POA: Diagnosis not present

## 2017-05-04 DIAGNOSIS — I4891 Unspecified atrial fibrillation: Secondary | ICD-10-CM | POA: Diagnosis not present

## 2017-05-04 DIAGNOSIS — I5032 Chronic diastolic (congestive) heart failure: Secondary | ICD-10-CM | POA: Diagnosis not present

## 2017-05-04 DIAGNOSIS — J441 Chronic obstructive pulmonary disease with (acute) exacerbation: Secondary | ICD-10-CM | POA: Diagnosis not present

## 2017-05-04 DIAGNOSIS — L89613 Pressure ulcer of right heel, stage 3: Secondary | ICD-10-CM | POA: Diagnosis not present

## 2017-05-04 DIAGNOSIS — R41841 Cognitive communication deficit: Secondary | ICD-10-CM | POA: Diagnosis not present

## 2017-05-06 DIAGNOSIS — I110 Hypertensive heart disease with heart failure: Secondary | ICD-10-CM | POA: Diagnosis not present

## 2017-05-06 DIAGNOSIS — I4891 Unspecified atrial fibrillation: Secondary | ICD-10-CM | POA: Diagnosis not present

## 2017-05-06 DIAGNOSIS — R41841 Cognitive communication deficit: Secondary | ICD-10-CM | POA: Diagnosis not present

## 2017-05-06 DIAGNOSIS — J441 Chronic obstructive pulmonary disease with (acute) exacerbation: Secondary | ICD-10-CM | POA: Diagnosis not present

## 2017-05-06 DIAGNOSIS — L89613 Pressure ulcer of right heel, stage 3: Secondary | ICD-10-CM | POA: Diagnosis not present

## 2017-05-06 DIAGNOSIS — I5032 Chronic diastolic (congestive) heart failure: Secondary | ICD-10-CM | POA: Diagnosis not present

## 2017-05-11 DIAGNOSIS — J441 Chronic obstructive pulmonary disease with (acute) exacerbation: Secondary | ICD-10-CM | POA: Diagnosis not present

## 2017-05-11 DIAGNOSIS — I110 Hypertensive heart disease with heart failure: Secondary | ICD-10-CM | POA: Diagnosis not present

## 2017-05-11 DIAGNOSIS — L89613 Pressure ulcer of right heel, stage 3: Secondary | ICD-10-CM | POA: Diagnosis not present

## 2017-05-11 DIAGNOSIS — I5032 Chronic diastolic (congestive) heart failure: Secondary | ICD-10-CM | POA: Diagnosis not present

## 2017-05-11 DIAGNOSIS — I4891 Unspecified atrial fibrillation: Secondary | ICD-10-CM | POA: Diagnosis not present

## 2017-05-11 DIAGNOSIS — R41841 Cognitive communication deficit: Secondary | ICD-10-CM | POA: Diagnosis not present

## 2017-05-13 DIAGNOSIS — I5042 Chronic combined systolic (congestive) and diastolic (congestive) heart failure: Secondary | ICD-10-CM | POA: Diagnosis not present

## 2017-05-13 DIAGNOSIS — L89613 Pressure ulcer of right heel, stage 3: Secondary | ICD-10-CM | POA: Diagnosis not present

## 2017-05-13 DIAGNOSIS — I110 Hypertensive heart disease with heart failure: Secondary | ICD-10-CM | POA: Diagnosis not present

## 2017-05-13 DIAGNOSIS — J441 Chronic obstructive pulmonary disease with (acute) exacerbation: Secondary | ICD-10-CM | POA: Diagnosis not present

## 2017-05-13 DIAGNOSIS — T84223D Displacement of internal fixation device of bones of foot and toes, subsequent encounter: Secondary | ICD-10-CM | POA: Diagnosis not present

## 2017-05-13 DIAGNOSIS — L89152 Pressure ulcer of sacral region, stage 2: Secondary | ICD-10-CM | POA: Diagnosis not present

## 2017-05-17 DIAGNOSIS — I5042 Chronic combined systolic (congestive) and diastolic (congestive) heart failure: Secondary | ICD-10-CM | POA: Diagnosis not present

## 2017-05-17 DIAGNOSIS — I110 Hypertensive heart disease with heart failure: Secondary | ICD-10-CM | POA: Diagnosis not present

## 2017-05-17 DIAGNOSIS — L89613 Pressure ulcer of right heel, stage 3: Secondary | ICD-10-CM | POA: Diagnosis not present

## 2017-05-17 DIAGNOSIS — J441 Chronic obstructive pulmonary disease with (acute) exacerbation: Secondary | ICD-10-CM | POA: Diagnosis not present

## 2017-05-17 DIAGNOSIS — T84223D Displacement of internal fixation device of bones of foot and toes, subsequent encounter: Secondary | ICD-10-CM | POA: Diagnosis not present

## 2017-05-17 DIAGNOSIS — L89152 Pressure ulcer of sacral region, stage 2: Secondary | ICD-10-CM | POA: Diagnosis not present

## 2017-05-18 DIAGNOSIS — I110 Hypertensive heart disease with heart failure: Secondary | ICD-10-CM | POA: Diagnosis not present

## 2017-05-18 DIAGNOSIS — T84223D Displacement of internal fixation device of bones of foot and toes, subsequent encounter: Secondary | ICD-10-CM | POA: Diagnosis not present

## 2017-05-18 DIAGNOSIS — L89152 Pressure ulcer of sacral region, stage 2: Secondary | ICD-10-CM | POA: Diagnosis not present

## 2017-05-18 DIAGNOSIS — L89613 Pressure ulcer of right heel, stage 3: Secondary | ICD-10-CM | POA: Diagnosis not present

## 2017-05-18 DIAGNOSIS — I5042 Chronic combined systolic (congestive) and diastolic (congestive) heart failure: Secondary | ICD-10-CM | POA: Diagnosis not present

## 2017-05-18 DIAGNOSIS — J441 Chronic obstructive pulmonary disease with (acute) exacerbation: Secondary | ICD-10-CM | POA: Diagnosis not present

## 2017-05-19 DIAGNOSIS — I110 Hypertensive heart disease with heart failure: Secondary | ICD-10-CM | POA: Diagnosis not present

## 2017-05-19 DIAGNOSIS — L89152 Pressure ulcer of sacral region, stage 2: Secondary | ICD-10-CM | POA: Diagnosis not present

## 2017-05-19 DIAGNOSIS — T84223D Displacement of internal fixation device of bones of foot and toes, subsequent encounter: Secondary | ICD-10-CM | POA: Diagnosis not present

## 2017-05-19 DIAGNOSIS — J441 Chronic obstructive pulmonary disease with (acute) exacerbation: Secondary | ICD-10-CM | POA: Diagnosis not present

## 2017-05-19 DIAGNOSIS — L89613 Pressure ulcer of right heel, stage 3: Secondary | ICD-10-CM | POA: Diagnosis not present

## 2017-05-19 DIAGNOSIS — I5042 Chronic combined systolic (congestive) and diastolic (congestive) heart failure: Secondary | ICD-10-CM | POA: Diagnosis not present

## 2017-05-21 DIAGNOSIS — J441 Chronic obstructive pulmonary disease with (acute) exacerbation: Secondary | ICD-10-CM | POA: Diagnosis not present

## 2017-05-21 DIAGNOSIS — I5042 Chronic combined systolic (congestive) and diastolic (congestive) heart failure: Secondary | ICD-10-CM | POA: Diagnosis not present

## 2017-05-21 DIAGNOSIS — L89152 Pressure ulcer of sacral region, stage 2: Secondary | ICD-10-CM | POA: Diagnosis not present

## 2017-05-21 DIAGNOSIS — L89613 Pressure ulcer of right heel, stage 3: Secondary | ICD-10-CM | POA: Diagnosis not present

## 2017-05-21 DIAGNOSIS — T84223D Displacement of internal fixation device of bones of foot and toes, subsequent encounter: Secondary | ICD-10-CM | POA: Diagnosis not present

## 2017-05-21 DIAGNOSIS — I110 Hypertensive heart disease with heart failure: Secondary | ICD-10-CM | POA: Diagnosis not present

## 2017-05-26 DIAGNOSIS — L89152 Pressure ulcer of sacral region, stage 2: Secondary | ICD-10-CM | POA: Diagnosis not present

## 2017-05-26 DIAGNOSIS — T84223D Displacement of internal fixation device of bones of foot and toes, subsequent encounter: Secondary | ICD-10-CM | POA: Diagnosis not present

## 2017-05-26 DIAGNOSIS — I110 Hypertensive heart disease with heart failure: Secondary | ICD-10-CM | POA: Diagnosis not present

## 2017-05-26 DIAGNOSIS — I5042 Chronic combined systolic (congestive) and diastolic (congestive) heart failure: Secondary | ICD-10-CM | POA: Diagnosis not present

## 2017-05-26 DIAGNOSIS — L89613 Pressure ulcer of right heel, stage 3: Secondary | ICD-10-CM | POA: Diagnosis not present

## 2017-05-26 DIAGNOSIS — J441 Chronic obstructive pulmonary disease with (acute) exacerbation: Secondary | ICD-10-CM | POA: Diagnosis not present

## 2017-06-01 DIAGNOSIS — L89152 Pressure ulcer of sacral region, stage 2: Secondary | ICD-10-CM | POA: Diagnosis not present

## 2017-06-01 DIAGNOSIS — J441 Chronic obstructive pulmonary disease with (acute) exacerbation: Secondary | ICD-10-CM | POA: Diagnosis not present

## 2017-06-01 DIAGNOSIS — I5042 Chronic combined systolic (congestive) and diastolic (congestive) heart failure: Secondary | ICD-10-CM | POA: Diagnosis not present

## 2017-06-01 DIAGNOSIS — T84223D Displacement of internal fixation device of bones of foot and toes, subsequent encounter: Secondary | ICD-10-CM | POA: Diagnosis not present

## 2017-06-01 DIAGNOSIS — I110 Hypertensive heart disease with heart failure: Secondary | ICD-10-CM | POA: Diagnosis not present

## 2017-06-01 DIAGNOSIS — L89613 Pressure ulcer of right heel, stage 3: Secondary | ICD-10-CM | POA: Diagnosis not present

## 2017-06-02 DIAGNOSIS — L89152 Pressure ulcer of sacral region, stage 2: Secondary | ICD-10-CM | POA: Diagnosis not present

## 2017-06-02 DIAGNOSIS — J441 Chronic obstructive pulmonary disease with (acute) exacerbation: Secondary | ICD-10-CM | POA: Diagnosis not present

## 2017-06-02 DIAGNOSIS — T84223D Displacement of internal fixation device of bones of foot and toes, subsequent encounter: Secondary | ICD-10-CM | POA: Diagnosis not present

## 2017-06-02 DIAGNOSIS — I110 Hypertensive heart disease with heart failure: Secondary | ICD-10-CM | POA: Diagnosis not present

## 2017-06-02 DIAGNOSIS — I5042 Chronic combined systolic (congestive) and diastolic (congestive) heart failure: Secondary | ICD-10-CM | POA: Diagnosis not present

## 2017-06-02 DIAGNOSIS — L89613 Pressure ulcer of right heel, stage 3: Secondary | ICD-10-CM | POA: Diagnosis not present

## 2017-06-09 DIAGNOSIS — L89613 Pressure ulcer of right heel, stage 3: Secondary | ICD-10-CM | POA: Diagnosis not present

## 2017-06-09 DIAGNOSIS — L89152 Pressure ulcer of sacral region, stage 2: Secondary | ICD-10-CM | POA: Diagnosis not present

## 2017-06-09 DIAGNOSIS — J441 Chronic obstructive pulmonary disease with (acute) exacerbation: Secondary | ICD-10-CM | POA: Diagnosis not present

## 2017-06-09 DIAGNOSIS — I5042 Chronic combined systolic (congestive) and diastolic (congestive) heart failure: Secondary | ICD-10-CM | POA: Diagnosis not present

## 2017-06-09 DIAGNOSIS — I110 Hypertensive heart disease with heart failure: Secondary | ICD-10-CM | POA: Diagnosis not present

## 2017-06-09 DIAGNOSIS — T84223D Displacement of internal fixation device of bones of foot and toes, subsequent encounter: Secondary | ICD-10-CM | POA: Diagnosis not present

## 2017-06-11 DIAGNOSIS — L89152 Pressure ulcer of sacral region, stage 2: Secondary | ICD-10-CM | POA: Diagnosis not present

## 2017-06-11 DIAGNOSIS — J441 Chronic obstructive pulmonary disease with (acute) exacerbation: Secondary | ICD-10-CM | POA: Diagnosis not present

## 2017-06-11 DIAGNOSIS — L89613 Pressure ulcer of right heel, stage 3: Secondary | ICD-10-CM | POA: Diagnosis not present

## 2017-06-11 DIAGNOSIS — I110 Hypertensive heart disease with heart failure: Secondary | ICD-10-CM | POA: Diagnosis not present

## 2017-06-11 DIAGNOSIS — I5042 Chronic combined systolic (congestive) and diastolic (congestive) heart failure: Secondary | ICD-10-CM | POA: Diagnosis not present

## 2017-06-11 DIAGNOSIS — T84223D Displacement of internal fixation device of bones of foot and toes, subsequent encounter: Secondary | ICD-10-CM | POA: Diagnosis not present

## 2017-06-14 DIAGNOSIS — T84223D Displacement of internal fixation device of bones of foot and toes, subsequent encounter: Secondary | ICD-10-CM | POA: Diagnosis not present

## 2017-06-14 DIAGNOSIS — L89152 Pressure ulcer of sacral region, stage 2: Secondary | ICD-10-CM | POA: Diagnosis not present

## 2017-06-14 DIAGNOSIS — I5042 Chronic combined systolic (congestive) and diastolic (congestive) heart failure: Secondary | ICD-10-CM | POA: Diagnosis not present

## 2017-06-14 DIAGNOSIS — J441 Chronic obstructive pulmonary disease with (acute) exacerbation: Secondary | ICD-10-CM | POA: Diagnosis not present

## 2017-06-14 DIAGNOSIS — L89613 Pressure ulcer of right heel, stage 3: Secondary | ICD-10-CM | POA: Diagnosis not present

## 2017-06-14 DIAGNOSIS — I110 Hypertensive heart disease with heart failure: Secondary | ICD-10-CM | POA: Diagnosis not present

## 2017-06-16 DIAGNOSIS — I5042 Chronic combined systolic (congestive) and diastolic (congestive) heart failure: Secondary | ICD-10-CM | POA: Diagnosis not present

## 2017-06-16 DIAGNOSIS — T84223D Displacement of internal fixation device of bones of foot and toes, subsequent encounter: Secondary | ICD-10-CM | POA: Diagnosis not present

## 2017-06-16 DIAGNOSIS — J441 Chronic obstructive pulmonary disease with (acute) exacerbation: Secondary | ICD-10-CM | POA: Diagnosis not present

## 2017-06-16 DIAGNOSIS — I110 Hypertensive heart disease with heart failure: Secondary | ICD-10-CM | POA: Diagnosis not present

## 2017-06-16 DIAGNOSIS — L89613 Pressure ulcer of right heel, stage 3: Secondary | ICD-10-CM | POA: Diagnosis not present

## 2017-06-16 DIAGNOSIS — L89152 Pressure ulcer of sacral region, stage 2: Secondary | ICD-10-CM | POA: Diagnosis not present

## 2017-06-17 DIAGNOSIS — L89613 Pressure ulcer of right heel, stage 3: Secondary | ICD-10-CM | POA: Diagnosis not present

## 2017-06-17 DIAGNOSIS — L89152 Pressure ulcer of sacral region, stage 2: Secondary | ICD-10-CM | POA: Diagnosis not present

## 2017-06-17 DIAGNOSIS — I110 Hypertensive heart disease with heart failure: Secondary | ICD-10-CM | POA: Diagnosis not present

## 2017-06-17 DIAGNOSIS — J441 Chronic obstructive pulmonary disease with (acute) exacerbation: Secondary | ICD-10-CM | POA: Diagnosis not present

## 2017-06-17 DIAGNOSIS — T84223D Displacement of internal fixation device of bones of foot and toes, subsequent encounter: Secondary | ICD-10-CM | POA: Diagnosis not present

## 2017-06-17 DIAGNOSIS — I5042 Chronic combined systolic (congestive) and diastolic (congestive) heart failure: Secondary | ICD-10-CM | POA: Diagnosis not present

## 2017-06-18 DIAGNOSIS — I110 Hypertensive heart disease with heart failure: Secondary | ICD-10-CM | POA: Diagnosis not present

## 2017-06-18 DIAGNOSIS — L89152 Pressure ulcer of sacral region, stage 2: Secondary | ICD-10-CM | POA: Diagnosis not present

## 2017-06-18 DIAGNOSIS — T84223D Displacement of internal fixation device of bones of foot and toes, subsequent encounter: Secondary | ICD-10-CM | POA: Diagnosis not present

## 2017-06-18 DIAGNOSIS — I5042 Chronic combined systolic (congestive) and diastolic (congestive) heart failure: Secondary | ICD-10-CM | POA: Diagnosis not present

## 2017-06-18 DIAGNOSIS — J441 Chronic obstructive pulmonary disease with (acute) exacerbation: Secondary | ICD-10-CM | POA: Diagnosis not present

## 2017-06-18 DIAGNOSIS — L89613 Pressure ulcer of right heel, stage 3: Secondary | ICD-10-CM | POA: Diagnosis not present

## 2017-06-22 DIAGNOSIS — T84223D Displacement of internal fixation device of bones of foot and toes, subsequent encounter: Secondary | ICD-10-CM | POA: Diagnosis not present

## 2017-06-22 DIAGNOSIS — J441 Chronic obstructive pulmonary disease with (acute) exacerbation: Secondary | ICD-10-CM | POA: Diagnosis not present

## 2017-06-22 DIAGNOSIS — L89152 Pressure ulcer of sacral region, stage 2: Secondary | ICD-10-CM | POA: Diagnosis not present

## 2017-06-22 DIAGNOSIS — L89613 Pressure ulcer of right heel, stage 3: Secondary | ICD-10-CM | POA: Diagnosis not present

## 2017-06-22 DIAGNOSIS — I5042 Chronic combined systolic (congestive) and diastolic (congestive) heart failure: Secondary | ICD-10-CM | POA: Diagnosis not present

## 2017-06-22 DIAGNOSIS — I110 Hypertensive heart disease with heart failure: Secondary | ICD-10-CM | POA: Diagnosis not present

## 2017-06-24 DIAGNOSIS — J441 Chronic obstructive pulmonary disease with (acute) exacerbation: Secondary | ICD-10-CM | POA: Diagnosis not present

## 2017-06-24 DIAGNOSIS — L89613 Pressure ulcer of right heel, stage 3: Secondary | ICD-10-CM | POA: Diagnosis not present

## 2017-06-24 DIAGNOSIS — I110 Hypertensive heart disease with heart failure: Secondary | ICD-10-CM | POA: Diagnosis not present

## 2017-06-24 DIAGNOSIS — I5042 Chronic combined systolic (congestive) and diastolic (congestive) heart failure: Secondary | ICD-10-CM | POA: Diagnosis not present

## 2017-06-24 DIAGNOSIS — T84223D Displacement of internal fixation device of bones of foot and toes, subsequent encounter: Secondary | ICD-10-CM | POA: Diagnosis not present

## 2017-06-24 DIAGNOSIS — L89152 Pressure ulcer of sacral region, stage 2: Secondary | ICD-10-CM | POA: Diagnosis not present

## 2017-06-25 DIAGNOSIS — L89613 Pressure ulcer of right heel, stage 3: Secondary | ICD-10-CM | POA: Diagnosis not present

## 2017-06-25 DIAGNOSIS — J441 Chronic obstructive pulmonary disease with (acute) exacerbation: Secondary | ICD-10-CM | POA: Diagnosis not present

## 2017-06-25 DIAGNOSIS — I5042 Chronic combined systolic (congestive) and diastolic (congestive) heart failure: Secondary | ICD-10-CM | POA: Diagnosis not present

## 2017-06-25 DIAGNOSIS — T84223D Displacement of internal fixation device of bones of foot and toes, subsequent encounter: Secondary | ICD-10-CM | POA: Diagnosis not present

## 2017-06-25 DIAGNOSIS — L89152 Pressure ulcer of sacral region, stage 2: Secondary | ICD-10-CM | POA: Diagnosis not present

## 2017-06-25 DIAGNOSIS — I110 Hypertensive heart disease with heart failure: Secondary | ICD-10-CM | POA: Diagnosis not present

## 2017-06-26 DIAGNOSIS — I5042 Chronic combined systolic (congestive) and diastolic (congestive) heart failure: Secondary | ICD-10-CM | POA: Diagnosis not present

## 2017-06-26 DIAGNOSIS — J441 Chronic obstructive pulmonary disease with (acute) exacerbation: Secondary | ICD-10-CM | POA: Diagnosis not present

## 2017-06-26 DIAGNOSIS — I110 Hypertensive heart disease with heart failure: Secondary | ICD-10-CM | POA: Diagnosis not present

## 2017-06-26 DIAGNOSIS — L89613 Pressure ulcer of right heel, stage 3: Secondary | ICD-10-CM | POA: Diagnosis not present

## 2017-06-26 DIAGNOSIS — L89152 Pressure ulcer of sacral region, stage 2: Secondary | ICD-10-CM | POA: Diagnosis not present

## 2017-06-26 DIAGNOSIS — T84223D Displacement of internal fixation device of bones of foot and toes, subsequent encounter: Secondary | ICD-10-CM | POA: Diagnosis not present

## 2017-06-28 DIAGNOSIS — L89613 Pressure ulcer of right heel, stage 3: Secondary | ICD-10-CM | POA: Diagnosis not present

## 2017-06-28 DIAGNOSIS — J441 Chronic obstructive pulmonary disease with (acute) exacerbation: Secondary | ICD-10-CM | POA: Diagnosis not present

## 2017-06-28 DIAGNOSIS — I110 Hypertensive heart disease with heart failure: Secondary | ICD-10-CM | POA: Diagnosis not present

## 2017-06-28 DIAGNOSIS — T84223D Displacement of internal fixation device of bones of foot and toes, subsequent encounter: Secondary | ICD-10-CM | POA: Diagnosis not present

## 2017-06-28 DIAGNOSIS — I5042 Chronic combined systolic (congestive) and diastolic (congestive) heart failure: Secondary | ICD-10-CM | POA: Diagnosis not present

## 2017-06-28 DIAGNOSIS — L89152 Pressure ulcer of sacral region, stage 2: Secondary | ICD-10-CM | POA: Diagnosis not present

## 2017-06-30 DIAGNOSIS — I5042 Chronic combined systolic (congestive) and diastolic (congestive) heart failure: Secondary | ICD-10-CM | POA: Diagnosis not present

## 2017-06-30 DIAGNOSIS — I110 Hypertensive heart disease with heart failure: Secondary | ICD-10-CM | POA: Diagnosis not present

## 2017-06-30 DIAGNOSIS — T84223D Displacement of internal fixation device of bones of foot and toes, subsequent encounter: Secondary | ICD-10-CM | POA: Diagnosis not present

## 2017-06-30 DIAGNOSIS — J441 Chronic obstructive pulmonary disease with (acute) exacerbation: Secondary | ICD-10-CM | POA: Diagnosis not present

## 2017-06-30 DIAGNOSIS — L89613 Pressure ulcer of right heel, stage 3: Secondary | ICD-10-CM | POA: Diagnosis not present

## 2017-06-30 DIAGNOSIS — L89152 Pressure ulcer of sacral region, stage 2: Secondary | ICD-10-CM | POA: Diagnosis not present

## 2017-07-02 DIAGNOSIS — T84223D Displacement of internal fixation device of bones of foot and toes, subsequent encounter: Secondary | ICD-10-CM | POA: Diagnosis not present

## 2017-07-02 DIAGNOSIS — L89613 Pressure ulcer of right heel, stage 3: Secondary | ICD-10-CM | POA: Diagnosis not present

## 2017-07-02 DIAGNOSIS — I110 Hypertensive heart disease with heart failure: Secondary | ICD-10-CM | POA: Diagnosis not present

## 2017-07-02 DIAGNOSIS — J441 Chronic obstructive pulmonary disease with (acute) exacerbation: Secondary | ICD-10-CM | POA: Diagnosis not present

## 2017-07-02 DIAGNOSIS — I5042 Chronic combined systolic (congestive) and diastolic (congestive) heart failure: Secondary | ICD-10-CM | POA: Diagnosis not present

## 2017-07-02 DIAGNOSIS — L89152 Pressure ulcer of sacral region, stage 2: Secondary | ICD-10-CM | POA: Diagnosis not present

## 2017-07-03 ENCOUNTER — Inpatient Hospital Stay (HOSPITAL_COMMUNITY)
Admission: EM | Admit: 2017-07-03 | Discharge: 2017-07-09 | DRG: 193 | Disposition: A | Discharge status: Home Health | Payer: Medicare Other | Attending: Family Medicine | Admitting: Family Medicine

## 2017-07-03 ENCOUNTER — Emergency Department (HOSPITAL_COMMUNITY): Payer: Medicare Other

## 2017-07-03 ENCOUNTER — Other Ambulatory Visit: Payer: Self-pay

## 2017-07-03 ENCOUNTER — Encounter (HOSPITAL_COMMUNITY): Payer: Self-pay | Admitting: *Deleted

## 2017-07-03 DIAGNOSIS — J449 Chronic obstructive pulmonary disease, unspecified: Secondary | ICD-10-CM | POA: Diagnosis not present

## 2017-07-03 DIAGNOSIS — R0602 Shortness of breath: Secondary | ICD-10-CM | POA: Diagnosis not present

## 2017-07-03 DIAGNOSIS — Z7401 Bed confinement status: Secondary | ICD-10-CM

## 2017-07-03 DIAGNOSIS — J9690 Respiratory failure, unspecified, unspecified whether with hypoxia or hypercapnia: Secondary | ICD-10-CM | POA: Diagnosis not present

## 2017-07-03 DIAGNOSIS — H409 Unspecified glaucoma: Secondary | ICD-10-CM | POA: Diagnosis present

## 2017-07-03 DIAGNOSIS — L8990 Pressure ulcer of unspecified site, unspecified stage: Secondary | ICD-10-CM | POA: Diagnosis present

## 2017-07-03 DIAGNOSIS — I5032 Chronic diastolic (congestive) heart failure: Secondary | ICD-10-CM | POA: Diagnosis present

## 2017-07-03 DIAGNOSIS — I5043 Acute on chronic combined systolic (congestive) and diastolic (congestive) heart failure: Secondary | ICD-10-CM | POA: Diagnosis not present

## 2017-07-03 DIAGNOSIS — J9601 Acute respiratory failure with hypoxia: Secondary | ICD-10-CM | POA: Diagnosis present

## 2017-07-03 DIAGNOSIS — I429 Cardiomyopathy, unspecified: Secondary | ICD-10-CM

## 2017-07-03 DIAGNOSIS — J9622 Acute and chronic respiratory failure with hypercapnia: Secondary | ICD-10-CM | POA: Diagnosis present

## 2017-07-03 DIAGNOSIS — R0682 Tachypnea, not elsewhere classified: Secondary | ICD-10-CM | POA: Diagnosis not present

## 2017-07-03 DIAGNOSIS — L89159 Pressure ulcer of sacral region, unspecified stage: Secondary | ICD-10-CM | POA: Diagnosis present

## 2017-07-03 DIAGNOSIS — N179 Acute kidney failure, unspecified: Secondary | ICD-10-CM | POA: Diagnosis present

## 2017-07-03 DIAGNOSIS — E039 Hypothyroidism, unspecified: Secondary | ICD-10-CM | POA: Diagnosis not present

## 2017-07-03 DIAGNOSIS — Z7901 Long term (current) use of anticoagulants: Secondary | ICD-10-CM | POA: Diagnosis not present

## 2017-07-03 DIAGNOSIS — J440 Chronic obstructive pulmonary disease with acute lower respiratory infection: Secondary | ICD-10-CM | POA: Diagnosis present

## 2017-07-03 DIAGNOSIS — J9611 Chronic respiratory failure with hypoxia: Secondary | ICD-10-CM

## 2017-07-03 DIAGNOSIS — K219 Gastro-esophageal reflux disease without esophagitis: Secondary | ICD-10-CM | POA: Diagnosis present

## 2017-07-03 DIAGNOSIS — J9602 Acute respiratory failure with hypercapnia: Secondary | ICD-10-CM | POA: Diagnosis not present

## 2017-07-03 DIAGNOSIS — J9621 Acute and chronic respiratory failure with hypoxia: Secondary | ICD-10-CM | POA: Diagnosis present

## 2017-07-03 DIAGNOSIS — G9341 Metabolic encephalopathy: Secondary | ICD-10-CM | POA: Diagnosis present

## 2017-07-03 DIAGNOSIS — J121 Respiratory syncytial virus pneumonia: Principal | ICD-10-CM | POA: Diagnosis present

## 2017-07-03 DIAGNOSIS — E872 Acidosis: Secondary | ICD-10-CM | POA: Diagnosis present

## 2017-07-03 DIAGNOSIS — I130 Hypertensive heart and chronic kidney disease with heart failure and stage 1 through stage 4 chronic kidney disease, or unspecified chronic kidney disease: Secondary | ICD-10-CM | POA: Diagnosis present

## 2017-07-03 DIAGNOSIS — D649 Anemia, unspecified: Secondary | ICD-10-CM | POA: Diagnosis not present

## 2017-07-03 DIAGNOSIS — J441 Chronic obstructive pulmonary disease with (acute) exacerbation: Secondary | ICD-10-CM | POA: Diagnosis present

## 2017-07-03 DIAGNOSIS — J9600 Acute respiratory failure, unspecified whether with hypoxia or hypercapnia: Secondary | ICD-10-CM | POA: Diagnosis not present

## 2017-07-03 DIAGNOSIS — N182 Chronic kidney disease, stage 2 (mild): Secondary | ICD-10-CM | POA: Diagnosis present

## 2017-07-03 DIAGNOSIS — Z9981 Dependence on supplemental oxygen: Secondary | ICD-10-CM

## 2017-07-03 DIAGNOSIS — L89519 Pressure ulcer of right ankle, unspecified stage: Secondary | ICD-10-CM | POA: Diagnosis present

## 2017-07-03 DIAGNOSIS — D638 Anemia in other chronic diseases classified elsewhere: Secondary | ICD-10-CM | POA: Diagnosis present

## 2017-07-03 DIAGNOSIS — G629 Polyneuropathy, unspecified: Secondary | ICD-10-CM | POA: Diagnosis present

## 2017-07-03 DIAGNOSIS — R4020 Unspecified coma: Secondary | ICD-10-CM | POA: Diagnosis not present

## 2017-07-03 DIAGNOSIS — N390 Urinary tract infection, site not specified: Secondary | ICD-10-CM | POA: Diagnosis present

## 2017-07-03 DIAGNOSIS — I5022 Chronic systolic (congestive) heart failure: Secondary | ICD-10-CM

## 2017-07-03 DIAGNOSIS — I700 Atherosclerosis of aorta: Secondary | ICD-10-CM

## 2017-07-03 DIAGNOSIS — G9340 Encephalopathy, unspecified: Secondary | ICD-10-CM | POA: Diagnosis not present

## 2017-07-03 DIAGNOSIS — M810 Age-related osteoporosis without current pathological fracture: Secondary | ICD-10-CM | POA: Diagnosis present

## 2017-07-03 DIAGNOSIS — I482 Chronic atrial fibrillation: Secondary | ICD-10-CM | POA: Diagnosis present

## 2017-07-03 DIAGNOSIS — I4891 Unspecified atrial fibrillation: Secondary | ICD-10-CM | POA: Diagnosis not present

## 2017-07-03 DIAGNOSIS — R1310 Dysphagia, unspecified: Secondary | ICD-10-CM | POA: Diagnosis present

## 2017-07-03 DIAGNOSIS — M1990 Unspecified osteoarthritis, unspecified site: Secondary | ICD-10-CM | POA: Diagnosis present

## 2017-07-03 DIAGNOSIS — B964 Proteus (mirabilis) (morganii) as the cause of diseases classified elsewhere: Secondary | ICD-10-CM | POA: Diagnosis present

## 2017-07-03 DIAGNOSIS — Z79899 Other long term (current) drug therapy: Secondary | ICD-10-CM

## 2017-07-03 DIAGNOSIS — Z87891 Personal history of nicotine dependence: Secondary | ICD-10-CM

## 2017-07-03 DIAGNOSIS — L89622 Pressure ulcer of left heel, stage 2: Secondary | ICD-10-CM | POA: Diagnosis present

## 2017-07-03 DIAGNOSIS — R1314 Dysphagia, pharyngoesophageal phase: Secondary | ICD-10-CM | POA: Diagnosis present

## 2017-07-03 DIAGNOSIS — L89529 Pressure ulcer of left ankle, unspecified stage: Secondary | ICD-10-CM | POA: Diagnosis present

## 2017-07-03 HISTORY — DX: Heart failure, unspecified: I50.9

## 2017-07-03 HISTORY — DX: Atherosclerosis of aorta: I70.0

## 2017-07-03 LAB — URINALYSIS, ROUTINE W REFLEX MICROSCOPIC
BILIRUBIN URINE: NEGATIVE
GLUCOSE, UA: NEGATIVE mg/dL
KETONES UR: NEGATIVE mg/dL
Nitrite: NEGATIVE
PROTEIN: 100 mg/dL — AB
Specific Gravity, Urine: 1.021 (ref 1.005–1.030)
pH: 6 (ref 5.0–8.0)

## 2017-07-03 LAB — CBC WITH DIFFERENTIAL/PLATELET
Basophils Absolute: 0 10*3/uL (ref 0.0–0.1)
Basophils Relative: 0 %
Eosinophils Absolute: 0 10*3/uL (ref 0.0–0.7)
Eosinophils Relative: 0 %
HEMATOCRIT: 29.6 % — AB (ref 36.0–46.0)
HEMOGLOBIN: 9.2 g/dL — AB (ref 12.0–15.0)
LYMPHS ABS: 0.7 10*3/uL (ref 0.7–4.0)
LYMPHS PCT: 9 %
MCH: 30.4 pg (ref 26.0–34.0)
MCHC: 31.1 g/dL (ref 30.0–36.0)
MCV: 97.7 fL (ref 78.0–100.0)
Monocytes Absolute: 0.2 10*3/uL (ref 0.1–1.0)
Monocytes Relative: 3 %
NEUTROS PCT: 88 %
Neutro Abs: 6.8 10*3/uL (ref 1.7–7.7)
Platelets: 382 10*3/uL (ref 150–400)
RBC: 3.03 MIL/uL — AB (ref 3.87–5.11)
RDW: 15.8 % — ABNORMAL HIGH (ref 11.5–15.5)
WBC: 7.7 10*3/uL (ref 4.0–10.5)

## 2017-07-03 LAB — I-STAT CHEM 8, ED
BUN: 26 mg/dL — AB (ref 6–20)
CALCIUM ION: 1.32 mmol/L (ref 1.15–1.40)
CHLORIDE: 109 mmol/L (ref 101–111)
CREATININE: 1.3 mg/dL — AB (ref 0.44–1.00)
Glucose, Bld: 145 mg/dL — ABNORMAL HIGH (ref 65–99)
HCT: 29 % — ABNORMAL LOW (ref 36.0–46.0)
Hemoglobin: 9.9 g/dL — ABNORMAL LOW (ref 12.0–15.0)
Potassium: 5 mmol/L (ref 3.5–5.1)
SODIUM: 134 mmol/L — AB (ref 135–145)
TCO2: 20 mmol/L — ABNORMAL LOW (ref 22–32)

## 2017-07-03 LAB — I-STAT ARTERIAL BLOOD GAS, ED
Acid-base deficit: 11 mmol/L — ABNORMAL HIGH (ref 0.0–2.0)
Acid-base deficit: 10 mmol/L — ABNORMAL HIGH (ref 0.0–2.0)
BICARBONATE: 16.9 mmol/L — AB (ref 20.0–28.0)
Bicarbonate: 19 mmol/L — ABNORMAL LOW (ref 20.0–28.0)
O2 SAT: 100 %
O2 Saturation: 98 %
PCO2 ART: 65.9 mmHg — AB (ref 32.0–48.0)
PCO2 ART: 41.8 mmHg (ref 32.0–48.0)
PH ART: 7.213 — AB (ref 7.350–7.450)
PO2 ART: 273 mmHg — AB (ref 83.0–108.0)
PO2 ART: 127 mmHg — AB (ref 83.0–108.0)
Patient temperature: 98
TCO2: 21 mmol/L — ABNORMAL LOW (ref 22–32)
TCO2: 18 mmol/L — AB (ref 22–32)
pH, Arterial: 7.068 — CL (ref 7.350–7.450)

## 2017-07-03 LAB — COMPREHENSIVE METABOLIC PANEL
ALBUMIN: 2.7 g/dL — AB (ref 3.5–5.0)
ALK PHOS: 145 U/L — AB (ref 38–126)
ALT: 32 U/L (ref 14–54)
ANION GAP: 5 (ref 5–15)
AST: 85 U/L — ABNORMAL HIGH (ref 15–41)
BILIRUBIN TOTAL: 0.4 mg/dL (ref 0.3–1.2)
BUN: 22 mg/dL — AB (ref 6–20)
CO2: 19 mmol/L — ABNORMAL LOW (ref 22–32)
Calcium: 9 mg/dL (ref 8.9–10.3)
Chloride: 108 mmol/L (ref 101–111)
Creatinine, Ser: 1.21 mg/dL — ABNORMAL HIGH (ref 0.44–1.00)
GFR calc Af Amer: 45 mL/min — ABNORMAL LOW (ref >60)
GFR calc non Af Amer: 39 mL/min — ABNORMAL LOW (ref >60)
GLUCOSE: 143 mg/dL — AB (ref 65–99)
Potassium: 5.1 mmol/L (ref 3.5–5.1)
Sodium: 132 mmol/L — ABNORMAL LOW (ref 135–145)
TOTAL PROTEIN: 6.9 g/dL (ref 6.5–8.1)

## 2017-07-03 LAB — I-STAT CG4 LACTIC ACID, ED
LACTIC ACID, VENOUS: 1.1 mmol/L (ref 0.5–1.9)
Lactic Acid, Venous: 1.01 mmol/L (ref 0.5–1.9)

## 2017-07-03 LAB — GLUCOSE, CAPILLARY: Glucose-Capillary: 148 mg/dL — ABNORMAL HIGH (ref 65–99)

## 2017-07-03 LAB — CBG MONITORING, ED: Glucose-Capillary: 142 mg/dL — ABNORMAL HIGH (ref 65–99)

## 2017-07-03 LAB — BRAIN NATRIURETIC PEPTIDE: B Natriuretic Peptide: 1208 pg/mL — ABNORMAL HIGH (ref 0.0–100.0)

## 2017-07-03 LAB — PHOSPHORUS: Phosphorus: 4.5 mg/dL (ref 2.5–4.6)

## 2017-07-03 LAB — MAGNESIUM: MAGNESIUM: 2.6 mg/dL — AB (ref 1.7–2.4)

## 2017-07-03 LAB — PROTIME-INR
INR: 9.48
PROTHROMBIN TIME: 75.9 s — AB (ref 11.4–15.2)

## 2017-07-03 LAB — TSH: TSH: 15.374 u[IU]/mL — ABNORMAL HIGH (ref 0.350–4.500)

## 2017-07-03 MED ORDER — VITAMIN K1 10 MG/ML IJ SOLN
5.0000 mg | Freq: Once | INTRAVENOUS | Status: AC
  Administered 2017-07-03: 5 mg via INTRAVENOUS
  Filled 2017-07-03: qty 0.5

## 2017-07-03 MED ORDER — IPRATROPIUM-ALBUTEROL 0.5-2.5 (3) MG/3ML IN SOLN: 3.0000 mL | RESPIRATORY_TRACT | Status: DC | PRN

## 2017-07-03 MED ORDER — SODIUM CHLORIDE 0.9 % IV BOLUS (SEPSIS)
500.0000 mL | Freq: Once | INTRAVENOUS | Status: AC
  Administered 2017-07-03: 500 mL via INTRAVENOUS

## 2017-07-03 MED ORDER — VANCOMYCIN HCL IN DEXTROSE 1-5 GM/200ML-% IV SOLN
1000.0000 mg | INTRAVENOUS | Status: DC
  Administered 2017-07-04 – 2017-07-05 (×2): 1000 mg via INTRAVENOUS
  Filled 2017-07-03 (×2): qty 200

## 2017-07-03 MED ORDER — SODIUM CHLORIDE 0.9 % IV SOLN
1.0000 g | Freq: Two times a day (BID) | INTRAVENOUS | Status: AC
  Administered 2017-07-03 – 2017-07-07 (×9): 1 g via INTRAVENOUS
  Filled 2017-07-03 (×10): qty 1

## 2017-07-03 MED ORDER — LEVOTHYROXINE SODIUM 100 MCG IV SOLR
44.0000 ug | Freq: Every day | INTRAVENOUS | Status: DC
  Filled 2017-07-03 (×2): qty 5

## 2017-07-03 MED ORDER — VANCOMYCIN HCL 10 G IV SOLR
1250.0000 mg | Freq: Once | INTRAVENOUS | Status: AC
  Administered 2017-07-03: 1250 mg via INTRAVENOUS
  Filled 2017-07-03: qty 1250

## 2017-07-03 NOTE — ED Notes (Signed)
Received called from lab.  States second set of blood cultures unable to be accepted.

## 2017-07-03 NOTE — ED Notes (Signed)
Dr. Alvino Chapel notified of critical lab (INR).

## 2017-07-03 NOTE — ED Notes (Signed)
Admitting at bedside 

## 2017-07-03 NOTE — ED Notes (Signed)
Patient transported to CT 

## 2017-07-03 NOTE — ED Triage Notes (Signed)
Pt here from home via [facility].  Family states pt has been lethargic today, and called [facility] when she could no longer hold head up.  Sats of 60% when [facility] arrived with GCS OF 4.  CBG 87.  Given 5 albuterol, 0.5 atrovent, 125 solumedrol, 2 mg mag and 11.25 of racemic epi.

## 2017-07-03 NOTE — ED Notes (Signed)
Pt turned and readjusted with pillows for comfort.

## 2017-07-03 NOTE — Progress Notes (Signed)
Pharmacy Antibiotic Note  [name] is a 83 y.o. female admitted on 07/03/2017 with pneumonia, recent ESBL UTI + bacteremia.  Pharmacy has been consulted for vancomycin/meropenem dosing. Afebrile, WBC wnl, LA 1.01. SCr 1.3 on admit, CrCl~30.  Plan: Meropenem 1g IV q12h Vancomycin 1250mg  IV x1; then 1g IV q24h Monitor clinical progress, c/s, renal function F/u de-escalation plan/LOT, vancomycin trough as indicated   Weight: 139 lb (63 kg)  Temp (24hrs), Avg:98 F (36.7 C), Min:98 F (36.7 C), Max:98 F (36.7 C)  Recent Labs  Lab 07/03/17 1533 07/03/17 1544 07/03/17 1545 07/03/17 1941  WBC 7.7  --   --   --   CREATININE 1.21* 1.30*  --   --   LATICACIDVEN  --   --  1.10 1.01    Estimated Creatinine Clearance: 29.8 mL/min (A) (by C-G formula based on SCr of 1.3 mg/dL (H)).    No Known Allergies  Antimicrobials this admission: 11/17 vancomycin >>  11/17 meropenem >>   Dose adjustments this admission:   Microbiology results:   Elicia Lamp, PharmD, BCPS Clinical Pharmacist 07/03/2017 9:00 PM

## 2017-07-03 NOTE — ED Provider Notes (Signed)
[facility] ICU Provider Note   CSN: [id number] Arrival date & time: 07/03/17  1505     History   Chief Complaint No chief complaint on file.  Level 5 caveat due to altered mental status HPI [name] is a 83 y.o. female.  HPI Patient presents with unresponsiveness.  At baseline is verbal but does not ambulate.  Reportedly had been eating earlier today.  Has had a cough over the last couple days.  Has had decreased oral intake.  No fevers.  Discussed with patient's daughter and states that patient would want everything done but has been seen by palliative care in the past.  Found to be hypoxic by EMS.  She is on chronic oxygen.  Received steroids magnesium and breathing treatment by EMS. Past Medical History:  Diagnosis Date  . Atrial fibrillation (HCC)    chronic Coumadin.   . CHF (congestive heart failure) ([place])   . COPD (chronic obstructive pulmonary disease) ([place])   . Glaucoma   . Hypothyroidism   . Neuropathy    PERIPHERAL  . Osteoarthritis   . Osteoporosis   . Trimalleolar fracture    LEFT TRIMALLEOLAR FRACTURE, DISLOCATION WITH OBLIQUE FRACTURE OF THE DISTAL FIBULAR DIAPHYSIS, AND NOTED BEING MILDLY COMMINUTED    Patient Active Problem List   Diagnosis Date Noted  . Acute respiratory failure with hypoxia and hypercapnia (HCC)   . Labored breathing   . Pressure injury of skin 04/01/2017  . Protein-calorie malnutrition, severe 04/01/2017  . UTI (urinary tract infection) 03/31/2017  . Acute metabolic encephalopathy 70/62/3762  . Respiratory distress   . Acute systolic heart failure ([place]) 10/09/2016  . Cardiomyopathy ([place]) 10/09/2016  . Goals of care, counseling/discussion   . Palliative care encounter   . Encounter for hospice care discussion   . Dysphagia   . COPD with acute exacerbation ([place]) 10/04/2016  . Sepsis due to pneumonia ([place]) 10/04/2016  . Acute encephalopathy 10/04/2016  . Debility   . CAP (community acquired  pneumonia) 10/03/2016  . AKI (acute kidney injury) ([place]) 10/03/2016  . Hypokalemia 10/03/2016  . Normocytic anemia 10/03/2016  . Acute respiratory failure with hypoxia ([place]) 10/03/2016  . Chronic diastolic CHF (congestive heart failure) ([place]) 10/03/2016  . Hypotension 10/03/2016  . Atrial fibrillation ([place])   . Hypothyroidism   . Glaucoma   . Neuropathy   . Osteoporosis   . Osteoarthritis   . Fall   . Trimalleolar fracture   . HYPOTHYROIDISM 03/02/2007  . GLAUCOMA 03/02/2007  . ATRIAL FIBRILLATION 03/02/2007  . PSORIASIS 03/02/2007    Past Surgical History:  Procedure Laterality Date  . COLONOSCOPY W/ POLYPECTOMY  11/2010   Dr Fuller Plan.  multiple polyps:cecal, transverse, sigmoid.  largest 23mm, path: tubular adenomas without HGD.  mild sigmoid diverticulosis.   Marland Kitchen ORIF TIBIA & FIBULA FRACTURES  2016  . VAGINAL HYSTERECTOMY      OB History    No data available       Home Medications    Prior to Admission medications   Medication Sig Start Date End Date Taking? Authorizing Provider  albuterol (PROVENTIL) (2.5 MG/3ML) 0.083% nebulizer solution Take 2.5 mg every 6 (six) hours as needed by nebulization for wheezing or shortness of breath.   Yes [provider]  Ascorbic Acid (VITAMIN C) 1000 MG tablet Take 1,000 mg 2 (two) times daily by mouth.   Yes [provider]  Cholecalciferol 50000 units TABS Take 50,000 Units once a week by mouth. Taken  weekly on thursdays   Yes [provider]  dorzolamide (TRUSOPT) 2 % ophthalmic solution Place 1 drop 2 (two) times daily into the left eye.   Yes [provider]  ergocalciferol (VITAMIN D2) 50000 UNITS capsule Take 50,000 Units by mouth every Thursday.    Yes [provider]  ferrous sulfate 325 (65 FE) MG tablet Take 1 tablet (325 mg total) by mouth daily with breakfast. 04/10/17  Yes Short, Noah Delaine, MD  FLUoxetine (PROZAC) 20 MG tablet Take 20 mg daily by mouth.  03/29/17  Yes [provider]  gabapentin (NEURONTIN) 600 MG tablet Take 600 mg at bedtime by mouth.   Yes [provider]  guaiFENesin (MUCINEX) 600 MG 12 hr tablet Take 600 mg 2 (two) times daily by mouth.   Yes [provider]  levothyroxine (SYNTHROID, LEVOTHROID) 88 MCG tablet Take 88 mcg daily before breakfast by mouth.    Yes [provider]  loratadine (CLARITIN) 10 MG tablet Take 10 mg 2 (two) times daily by mouth.   Yes [provider]  loratadine-pseudoephedrine (CLARITIN-D 24-HOUR) 10-240 MG 24 hr tablet Take 1 tablet daily by mouth.   Yes [provider]  methocarbamol (ROBAXIN) 500 MG tablet Take 500 mg 2 (two) times daily by mouth. 07/02/17  Yes [provider]  metoprolol tartrate (LOPRESSOR) 25 MG tablet Take 12.5 mg 2 (two) times daily by mouth.   Yes [provider]  mirtazapine (REMERON) 7.5 MG tablet Take 7.5 mg by mouth every evening. 03/09/17  Yes [provider]  Misc Natural Products (LUTEIN 20 PO) Take 20 mg daily by mouth.   Yes [provider]  omeprazole (PRILOSEC) 40 MG capsule Take 40 mg by mouth 2 (two) times daily.    Yes [provider]  oxyCODONE-acetaminophen (PERCOCET/ROXICET) 5-325 MG tablet Take 1 tablet 2 (two) times daily by mouth. 06/24/17  Yes [provider]  OXYGEN Inhale 3 L daily into the lungs. Continuous    Yes [provider]  pilocarpine (PILOCAR) 4 % ophthalmic solution Place 1 drop 2 (two) times daily into the left eye.   Yes [provider]  PROMETHAZINE-DM PO Take 2.5 mLs as needed by mouth (Congestion and Nausea).   Yes [provider]  rOPINIRole (REQUIP) 1 MG tablet Take 1 mg by mouth every evening. 02/11/17  Yes [provider]  timolol (TIMOPTIC) 0.5 % ophthalmic solution Place 1 drop into the left eye 2 (two) times daily. 09/11/16  Yes [provider]  travoprost, benzalkonium, (TRAVATAN) 0.004 % ophthalmic solution  Place 1 drop into the left eye at bedtime.    Yes [provider]  warfarin (COUMADIN) 3 MG tablet Take 1.5-3 mg See admin instructions by mouth. Take half a tablet (1.5mg ) on Monday, Wednesday, Friday, Saturday and Sunday Take 1 tablet (3mg ) on Tuesday and Thursday   Yes [provider]  collagenase (SANTYL) ointment Apply topically daily. Patient not taking: Reported on 07/03/2017 04/10/17   Janece Canterbury, MD  feeding supplement, ENSURE ENLIVE, (ENSURE ENLIVE) LIQD Take 237 mLs by mouth 2 (two) times daily between meals. Patient not taking: Reported on 07/03/2017 04/07/17   Janece Canterbury, MD  loperamide (IMODIUM) 2 MG capsule Take 1 capsule (2 mg total) by mouth daily as needed for diarrhea or loose stools. 04/09/17   Janece Canterbury, MD    Family History Family History  Problem Relation Age of Onset  . CAD Mother   . Heart failure Father   .  Heart failure Brother   . CAD Brother   . Colon cancer Neg Hx   . Stomach cancer Neg Hx     Social History Social History   Tobacco Use  . Smoking status: Never Smoker  . Smokeless tobacco: Never Used  Substance Use Topics  . Alcohol use: No  . Drug use: No     Allergies   Patient has no known allergies.   Review of Systems Review of Systems  Unable to perform ROS: Mental status change     Physical Exam Updated Vital Signs BP (!) 88/62   Pulse 67   Temp 97.7 F (36.5 C) (Oral)   Resp (!) 28   Wt 63 kg (139 lb)   SpO2 (!) 85%   BMI 20.53 kg/m   Physical Exam  Constitutional: She appears well-developed.  HENT:  Head: Atraumatic.  Eyes:  Right  pupil approximately 4 mm in left pupil 2 mm  Neck: Neck supple.  Cardiovascular: Normal rate.  Pulmonary/Chest: She has no rales.  Abdominal: She exhibits no distension.  Musculoskeletal: She exhibits no edema.  Neurological:  No corneal reflex.  No threat reflex.  No gag reflex.  No response to pain.  Skin: Skin is warm. Capillary refill takes less  than 2 seconds.     ED Treatments / Results  Labs (all labs ordered are listed, but only abnormal results are displayed) Labs Reviewed  CBC WITH DIFFERENTIAL/PLATELET - Abnormal; Notable for the following components:      Result Value   RBC 3.03 (*)    Hemoglobin 9.2 (*)    HCT 29.6 (*)    RDW 15.8 (*)    All other components within normal limits  COMPREHENSIVE METABOLIC PANEL - Abnormal; Notable for the following components:   Sodium 132 (*)    CO2 19 (*)    Glucose, Bld 143 (*)    BUN 22 (*)    Creatinine, Ser 1.21 (*)    Albumin 2.7 (*)    AST 85 (*)    Alkaline Phosphatase 145 (*)    GFR calc non Af Amer 39 (*)    GFR calc Af Amer 45 (*)    All other components within normal limits  PROTIME-INR - Abnormal; Notable for the following components:   Prothrombin Time 75.9 (*)    INR 9.48 (*)    All other components within normal limits  URINALYSIS, ROUTINE W REFLEX MICROSCOPIC - Abnormal; Notable for the following components:   Color, Urine AMBER (*)    APPearance CLOUDY (*)    Hgb urine dipstick LARGE (*)    Protein, ur 100 (*)    Leukocytes, UA TRACE (*)    Bacteria, UA MANY (*)    Squamous Epithelial / LPF 0-5 (*)    All other components within normal limits  BRAIN NATRIURETIC PEPTIDE - Abnormal; Notable for the following components:   B Natriuretic Peptide 1,208.0 (*)    All other components within normal limits  MAGNESIUM - Abnormal; Notable for the following components:   Magnesium 2.6 (*)    All other components within normal limits  TSH - Abnormal; Notable for the following components:   TSH 15.374 (*)    All other components within normal limits  I-STAT CHEM 8, ED - Abnormal; Notable for the following components:   Sodium 134 (*)    BUN 26 (*)    Creatinine, Ser 1.30 (*)    Glucose, Bld 145 (*)    TCO2 20 (*)  Hemoglobin 9.9 (*)    HCT 29.0 (*)    All other components within normal limits  CBG MONITORING, ED - Abnormal; Notable for the following  components:   Glucose-Capillary 142 (*)    All other components within normal limits  I-STAT ARTERIAL BLOOD GAS, ED - Abnormal; Notable for the following components:   pH, Arterial 7.068 (*)    pCO2 arterial 65.9 (*)    pO2, Arterial 273.0 (*)    Bicarbonate 19.0 (*)    TCO2 21 (*)    Acid-base deficit 11.0 (*)    All other components within normal limits  I-STAT ARTERIAL BLOOD GAS, ED - Abnormal; Notable for the following components:   pH, Arterial 7.213 (*)    pO2, Arterial 127.0 (*)    Bicarbonate 16.9 (*)    TCO2 18 (*)    Acid-base deficit 10.0 (*)    All other components within normal limits  CULTURE, BLOOD (ROUTINE X 2)  CULTURE, BLOOD (ROUTINE X 2)  URINE CULTURE  RESPIRATORY PANEL BY PCR  CULTURE, BLOOD (ROUTINE X 2)  PHOSPHORUS  CBC  RENAL FUNCTION PANEL  STREP PNEUMONIAE URINARY ANTIGEN  BLOOD GAS, ARTERIAL  GLUCOSE, CAPILLARY  I-STAT CG4 LACTIC ACID, ED  I-STAT CG4 LACTIC ACID, ED    EKG  EKG Interpretation  Date/Time:  Saturday July 03 2017 15:08:32 EST Ventricular Rate:  65 PR Interval:    QRS Duration: 112 QT Interval:  493 QTC Calculation: 513 R Axis:   30 Text Interpretation:  Atrial fibrillation Borderline intraventricular conduction delay Nonspecific repol abnormality, diffuse leads Prolonged QT interval Confirmed by Davonna Belling [phone]) on 07/03/2017 4:34:17 PM       Radiology Ct Head Wo Contrast  Result Date: 07/03/2017 CLINICAL DATA:  Altered level of consciousness. EXAM: CT HEAD WITHOUT CONTRAST TECHNIQUE: Contiguous axial images were obtained from the base of the skull through the vertex without intravenous contrast. COMPARISON:  04/01/2017 FINDINGS: Brain: There is atrophy and chronic small vessel disease changes. No acute intracranial abnormality. Specifically, no hemorrhage, hydrocephalus, mass lesion, acute infarction, or significant intracranial injury. Vascular: No hyperdense vessel or unexpected calcification. Skull: No acute  calvarial abnormality. Sinuses/Orbits: No acute finding. Other: None IMPRESSION: No acute intracranial abnormality. Atrophy, chronic microvascular disease. Electronically Signed   By: Rolm Baptise M.D.   On: 07/03/2017 16:06   Dg Chest Portable 1 View  Result Date: 07/03/2017 CLINICAL DATA:  Altered mental status and shortness of breath. EXAM: PORTABLE CHEST 1 VIEW COMPARISON:  Chest x-ray dated 04/06/2017. FINDINGS: Stable cardiomegaly. Atherosclerotic changes noted at the aortic arch. Vague opacities at each lung base, likely small pleural effusions and/or atelectasis. IMPRESSION: 1. Stable cardiomegaly. 2. Probable small bilateral pleural effusions and/or atelectasis. 3. Aortic atherosclerosis. Electronically Signed   By: Franki Cabot M.D.   On: 07/03/2017 15:31    Procedures Procedures (including critical care time)  Medications Ordered in ED Medications  ipratropium-albuterol (DUONEB) 0.5-2.5 (3) MG/3ML nebulizer solution 3 mL (not administered)  levothyroxine (SYNTHROID, LEVOTHROID) injection 44 mcg (not administered)  meropenem (MERREM) 1 g in sodium chloride 0.9 % 100 mL IVPB (1 g Intravenous Transfusing/Transfer 07/03/17 2315)  vancomycin (VANCOCIN) IVPB 1000 mg/200 mL premix (not administered)  sodium chloride 0.9 % bolus 500 mL (0 mLs Intravenous Stopped 07/03/17 1709)  sodium chloride 0.9 % bolus 500 mL (0 mLs Intravenous Stopped 07/03/17 1853)  vancomycin (VANCOCIN) 1,250 mg in sodium chloride 0.9 % 250 mL IVPB (1,250 mg Intravenous Transfusing/Transfer 07/03/17 2258)  phytonadione (VITAMIN K) 5  mg in dextrose 5 % 50 mL IVPB (5 mg Intravenous Transfusing/Transfer 07/03/17 2258)     Initial Impression / Assessment and Plan / ED Course  I have reviewed the triage vital signs and the nursing notes.  Pertinent labs & imaging results that were available during my care of the patient were reviewed by me and considered in my medical decision making (see chart for details).      Patient brought in for unresponsive.  Initially had a GCS of 3 but was breathing spontaneously.  Hypoxic upon EMS arrival but improved with oxygen.  Unclear whether she was on her oxygen at home.  Mental status is improved somewhat after IV fluids.  Some difficulty obtaining urine due to patient condition and the urine having to be redrawn after he could not be run due to error.  Mental status improved somewhat but still not quite at her baseline.  Seen by ICU in the ER.  Discussed with patient's daughter.  Patient would want everything done but still I think would maybe not do well with intubation.  Urine did show potential infection and has had resistant organisms in the past.  Antibiotics started by ICU.  Did not have fever.  Did have some mild hypotension but normal lactate.  Reportedly has a history of baseline hypotension.  CRITICAL CARE Performed by: Davonna Belling Total critical care time: 45 minutes Critical care time was exclusive of separately billable procedures and treating other patients. Critical care was necessary to treat or prevent imminent or life-threatening deterioration. Critical care was time spent personally by me on the following activities: development of treatment plan with patient and/or surrogate as well as nursing, discussions with consultants, evaluation of patient's response to treatment, examination of patient, obtaining history from patient or surrogate, ordering and performing treatments and interventions, ordering and review of laboratory studies, ordering and review of radiographic studies, pulse oximetry and re-evaluation of patient's condition.   Final Clinical Impressions(s) / ED Diagnoses   Final diagnoses:  Encephalopathy acute  Urinary tract infection without hematuria, site unspecified    ED Discharge Orders    None       Davonna Belling, MD 07/03/17 2339

## 2017-07-03 NOTE — ED Notes (Addendum)
Family at bedside.  States pt would like everything done.  Awaiting labs to determine intubation. Family states that patient is with [facility]

## 2017-07-03 NOTE — ED Notes (Signed)
PT re-positioned.  Mentation continues to improve.

## 2017-07-03 NOTE — ED Notes (Signed)
Pt beginning to respond to daughter

## 2017-07-03 NOTE — ED Notes (Signed)
Respiratory aware of need for ABG 

## 2017-07-03 NOTE — ED Notes (Signed)
Phlebotomy at bedside.

## 2017-07-03 NOTE — ED Notes (Signed)
Pt's BP dropping; Dr. Alvino Chapel notified and bolus ordered.

## 2017-07-03 NOTE — H&P (Addendum)
PULMONARY / CRITICAL CARE MEDICINE   Name: [name] MRN: [id number] DOB: 1926/09/20    ADMISSION DATE:  07/03/2017 CONSULTATION DATE:  07/03/2017  REFERRING MD:  Dr. Alvino Chapel  CHIEF COMPLAINT:  AMS  HISTORY OF PRESENT ILLNESS:  HPI obtained from medical chart and from patient's daughter at the bedside as patient is acutely encephalopathic.    83 year old female with PMH of Afib on coumadin, combined HF, COPD, former smoker, chronic respiratory failure on 3-4L [place], dysphagia, GERD, glaucoma w/anisocoria, hypothyroidsim, peripheral neuropathy, and chronic pressure ulcers who presented with acute lethargy on 11/17.    Patient is from home, cared for by her daughter, [name] and home health aid.  She is non-ambulatory for several years but alert and verbal at baseline with recent progressive decline over the last several months.  Recent hospitalization from 8/15 to 8/24 with acute encephalopathy related to septic shock secondary to ESBL proteus UTI and bacteremia briefly requiring vasopressors; completed ertapenem ~ 8/30 and on Bactrim DS since.    Daughter reports developed URI/ congestion since Tuesday being treated with home nebs, mucinex, and Claritin.  Sick contacts to daughter who has URI as well.  Onging poor appetite with recent decrease in PO intake and urine output with foul smelling brown urine, no fever, or worsening chronic diarrhea.  Today patient ate breakfast and then became more lethargic throughout the day and hypoxic on her O2.  EMS reported sats 68% on 3L treated with NRB, magnesium, steroids, and nebs.  On arrival to ER, her GCS was 4, afebrile, and initially normotensive but required 1L NS bolus for acute hypotension now resolved.  Remains on NRB and following commands but lethargic.  Labs noted for WBC 7.7, La 1.10, Na 134, K 5, sCr 1.3, glucose 145, INR 9.48, CT head negative, CXR with chronic bilateral effusions, UA pending.  PCCM called for admission for acute  encephalopathy, hypotension, acute on chronic hypoxic respiratory failure, and AKI.   PAST MEDICAL HISTORY :  She  has a past medical history of Atrial fibrillation ([place]), CHF (congestive heart failure) ([place]), COPD (chronic obstructive pulmonary disease) ([place]), Glaucoma, Hypothyroidism, Neuropathy, Osteoarthritis, Osteoporosis, and Trimalleolar fracture.  PAST SURGICAL HISTORY: She  has a past surgical history that includes ORIF tibia & fibula fractures (2016); Vaginal hysterectomy; and Colonoscopy w/ polypectomy (11/2010).  No Known Allergies  No current facility-administered medications on file prior to encounter.    Current Outpatient Medications on File Prior to Encounter  Medication Sig  . albuterol (PROVENTIL) (2.5 MG/3ML) 0.083% nebulizer solution Take 2.5 mg every 6 (six) hours as needed by nebulization for wheezing or shortness of breath.  . Ascorbic Acid (VITAMIN C) 1000 MG tablet Take 1,000 mg 2 (two) times daily by mouth.  . Cholecalciferol 50000 units TABS Take 50,000 Units once a week by mouth. Taken weekly on thursdays  . dorzolamide (TRUSOPT) 2 % ophthalmic solution Place 1 drop 2 (two) times daily into the left eye.  . ergocalciferol (VITAMIN D2) 50000 UNITS capsule Take 50,000 Units by mouth every Thursday.   . ferrous sulfate 325 (65 FE) MG tablet Take 1 tablet (325 mg total) by mouth daily with breakfast.  . FLUoxetine (PROZAC) 20 MG tablet Take 20 mg daily by mouth.   . gabapentin (NEURONTIN) 600 MG tablet Take 600 mg at bedtime by mouth.  Marland Kitchen guaiFENesin (MUCINEX) 600 MG 12 hr tablet Take 600 mg 2 (two) times daily by mouth.  . levothyroxine (SYNTHROID, LEVOTHROID) 88 MCG tablet Take 88  mcg daily before breakfast by mouth.   . loratadine (CLARITIN) 10 MG tablet Take 10 mg 2 (two) times daily by mouth.  . loratadine-pseudoephedrine (CLARITIN-D 24-HOUR) 10-240 MG 24 hr tablet Take 1 tablet daily by mouth.  . methocarbamol (ROBAXIN) 500 MG tablet Take 500 mg 2 (two) times  daily by mouth.  . metoprolol tartrate (LOPRESSOR) 25 MG tablet Take 12.5 mg 2 (two) times daily by mouth.  . mirtazapine (REMERON) 7.5 MG tablet Take 7.5 mg by mouth every evening.  . Misc Natural Products (LUTEIN 20 PO) Take 20 mg daily by mouth.  Marland Kitchen omeprazole (PRILOSEC) 40 MG capsule Take 40 mg by mouth 2 (two) times daily.   Marland Kitchen oxyCODONE-acetaminophen (PERCOCET/ROXICET) 5-325 MG tablet Take 1 tablet 2 (two) times daily by mouth.  . OXYGEN Inhale 3 L daily into the lungs. Continuous   . pilocarpine (PILOCAR) 4 % ophthalmic solution Place 1 drop 2 (two) times daily into the left eye.  Marland Kitchen PROMETHAZINE-DM PO Take 2.5 mLs as needed by mouth (Congestion and Nausea).  Marland Kitchen rOPINIRole (REQUIP) 1 MG tablet Take 1 mg by mouth every evening.  . timolol (TIMOPTIC) 0.5 % ophthalmic solution Place 1 drop into the left eye 2 (two) times daily.  . travoprost, benzalkonium, (TRAVATAN) 0.004 % ophthalmic solution Place 1 drop into the left eye at bedtime.   Marland Kitchen warfarin (COUMADIN) 3 MG tablet Take 1.5-3 mg See admin instructions by mouth. Take half a tablet (1.5mg ) on Monday, Wednesday, Friday, Saturday and Sunday Take 1 tablet (3mg ) on Tuesday and Thursday  . collagenase (SANTYL) ointment Apply topically daily. (Patient not taking: Reported on 07/03/2017)  . feeding supplement, ENSURE ENLIVE, (ENSURE ENLIVE) LIQD Take 237 mLs by mouth 2 (two) times daily between meals. (Patient not taking: Reported on 07/03/2017)  . loperamide (IMODIUM) 2 MG capsule Take 1 capsule (2 mg total) by mouth daily as needed for diarrhea or loose stools.    FAMILY HISTORY:  Her indicated that her mother is deceased. She indicated that her father is deceased. She indicated that the status of her brother is unknown. She indicated that the status of her neg hx is unknown.   SOCIAL HISTORY: She  reports that  has never smoked. she has never used smokeless tobacco. She reports that she does not drink alcohol or use drugs.  REVIEW OF  SYSTEMS:   Unable to obtain.   SUBJECTIVE:   VITAL SIGNS: BP 98/61   Pulse 79   Temp 98 F (36.7 C) (Rectal)   Resp (!) 24   Wt 139 lb (63 kg)   SpO2 100%   BMI 20.53 kg/m   HEMODYNAMICS:    VENTILATOR SETTINGS:    INTAKE / OUTPUT: I/O last 3 completed shifts: In: 1000 [IV Piggyback:1000] Out: -   PHYSICAL EXAMINATION: General:  Thin, frail elderly female lying in ER stretcher in moderate distress HEENT: MM pink/dry, dentures absent, +JVD Neuro:  Lethargic, awakens to voice, follows commands, MAE w/generalized weakness, moans, unable to understand speech  CV: IRIR - afib 60's, rate controlled, +2 pulses/warm distally PULM: even/labored w/accessory muscle use, audible rhochi, lungs bilaterally rhonchi diminished bibasilarly, unable to cough, no wheezing  GI: soft, non-tender, bs active, wearing adult diaper Extremities: warm/dry, no peripheral edema, bilateral foot drop Skin: no rashes, bruising to BLE  LABS:  BMET Recent Labs  Lab 07/03/17 1533 07/03/17 1544  NA 132* 134*  K 5.1 5.0  CL 108 109  CO2 19*  --   BUN 22* 26*  CREATININE 1.21* 1.30*  GLUCOSE 143* 145*    Electrolytes Recent Labs  Lab 07/03/17 1533  CALCIUM 9.0    CBC Recent Labs  Lab 07/03/17 1533 07/03/17 1544  WBC 7.7  --   HGB 9.2* 9.9*  HCT 29.6* 29.0*  PLT 382  --     Coag's Recent Labs  Lab 07/03/17 1533  INR 9.48*    Sepsis Markers Recent Labs  Lab 07/03/17 1545 07/03/17 1941  LATICACIDVEN 1.10 1.01    ABG No results for input(s): PHART, PCO2ART, PO2ART in the last 168 hours.  Liver Enzymes Recent Labs  Lab 07/03/17 1533  AST 85*  ALT 32  ALKPHOS 145*  BILITOT 0.4  ALBUMIN 2.7*    Cardiac Enzymes No results for input(s): TROPONINI, PROBNP in the last 168 hours.  Glucose Recent Labs  Lab 07/03/17 1515  GLUCAP 142*    Imaging Ct Head Wo Contrast  Result Date: 07/03/2017 CLINICAL DATA:  Altered level of consciousness. EXAM: CT HEAD  WITHOUT CONTRAST TECHNIQUE: Contiguous axial images were obtained from the base of the skull through the vertex without intravenous contrast. COMPARISON:  04/01/2017 FINDINGS: Brain: There is atrophy and chronic small vessel disease changes. No acute intracranial abnormality. Specifically, no hemorrhage, hydrocephalus, mass lesion, acute infarction, or significant intracranial injury. Vascular: No hyperdense vessel or unexpected calcification. Skull: No acute calvarial abnormality. Sinuses/Orbits: No acute finding. Other: None IMPRESSION: No acute intracranial abnormality. Atrophy, chronic microvascular disease. Electronically Signed   By: Rolm Baptise M.D.   On: 07/03/2017 16:06   Dg Chest Portable 1 View  Result Date: 07/03/2017 CLINICAL DATA:  Altered mental status and shortness of breath. EXAM: PORTABLE CHEST 1 VIEW COMPARISON:  Chest x-ray dated 04/06/2017. FINDINGS: Stable cardiomegaly. Atherosclerotic changes noted at the aortic arch. Vague opacities at each lung base, likely small pleural effusions and/or atelectasis. IMPRESSION: 1. Stable cardiomegaly. 2. Probable small bilateral pleural effusions and/or atelectasis. 3. Aortic atherosclerosis. Electronically Signed   By: Franki Cabot M.D.   On: 07/03/2017 15:31   STUDIES:  07/03/17 CT head >> no acute abnormality; atrophy; chronic microvascular disease  CULTURES: 11/17 BCx 2 >> 11/17 UC >> 11/17 RVP >>  ANTIBIOTICS: 11/17 Vanc >> 11/17 Meropenem >>  SIGNIFICANT EVENTS: 11/17 Admit  LINES/TUBES: PIV x 2   DISCUSSION: 31 yoF presenting from home with progressive decline but now with acute lethargy and acute on chronic hypoxic respiratory distress following URI symptoms since Tuesday.  Additionally being treated with Bactrim for 4 weeks for ESBL proteus UTI and bacteremia in 03/2017.  ASSESSMENT / PLAN:  PULMONARY A: Acute on chronic hypoxic and hypercapnic respiratory failure- w/ recent URI +/- pna vs pulmonary edema,  chronically on 3-4 L [place] Hx COPD- doubt acute exacerbation, no wheezing - ABG showing acute respiratory acidosis P:   - BiPAP now, not ideal given decreased mental status, however daughter is trying to contact her brother to determine whether to proceed with intubation. Will attempt Bipap to bridge patient, but likely will need intubation or transition to comfort care - ABG in 1 hour  - see ID - Duonebs q 4 prn - CXR in am - defer diuresis at this point given AKI and hypotension   CARDIOVASCULAR A:  Chronic Afib on coumadin Hypotension- does not meet SIRS criteria, fluid responsive Hx  HTN, systolic and dystolic HF (EF 14-43 % on 09/2016), f/b Dr. Einar Gip  P:  Tele monitoring  Goal map > 65 Assess BNP May need vasopressors- family still discussing code  status Lactate wnl Hold home coumadin, metoprolol  RENAL A:   AKI P:   S/p 1L NS  Assess mag/phos Trend BMP / urinary output- [place] Replace electrolytes as indicated  GASTROINTESTINAL A:   Hx GERD, dysphagia, chronic loose stools P:   NPO PPI  HEMATOLOGIC A:   Coagulopathy - INR 9.48 on coumadin Chronic anemia P:  H/h stable, no signs of bleeding Hold coumadin Vit K 5 mg x 1 Reassess INR in 12 hours Hold home ferrous sulfate  INFECTIOUS A:   Hx ESBL proteus UTI and bacteremia 03/2017 - on bactrim DS at home Possible URI vs superimposed PNA vs other infectious etiology, multiple pressure sores P:   Pan-culture Droplet and contact precautions Assess respiratory viral panel, strep urine Awaiting UA Empiric vancomycin and meropenem, narrow quickly as able Stop Bactrim DS  ENDOCRINE A:   Hypothyroidism P:   CBG q 4 while NPO Synthroid IV while NPO Assess TSH  NEUROLOGIC A:   Acute encephalopathy related to hypercarbia, hypotension, possible infectious etiology.  Also patient is known to be more sedated with her Neurontin.  - head CT neg Hx peripheral neuropathy, glaucoma w/anisocoria P:   Limit  sedation Frequent neuro checks Hold home robaxin, remeron, perocet, requip   FAMILY  - Updates: Daughter, Kristie Cowman [phone]) updated at bedside.  Code status dicussed and given her progressive decline over the last several months, age, known disability, and multiple acute and chronic medical conditions, would advise against CPR and intubation.  [name] is torn over what to do and needs to speak with her brother.  She acknowledges that her respraitory status is tenuous, agrees to try BiPAP in hopes to avoid intubation.  She will call her brother to decide on [place].  She is to remain full code for now.    - Inter-disciplinary family meet or Palliative Care meeting due by:  07/10/2017  CCT 60 mins  Kennieth Rad, AGACNP-BC [facility] Pgr: [phone] or if no answer [phone] 07/03/2017, 8:24 PM  Update : 11/18: 12:30 AM Patient has had significant improvement in PaCO2 and mentation with BIPAP. Will continue the patient on BIPAP for now. Patient is significantly more conversant and not using accessory muscles to breath. Will hold of intubation for now and monitor. Continue with current treatment.  Rojelio Brenner [facility] Pager: [phone]

## 2017-07-03 NOTE — Progress Notes (Signed)
PCCM Interval Note  Repeat ABG reviewed and much improved after NIPPV.  Patient is much more alert and trying to talk to daughter at bedside.  Baker Janus states that after talking to her brother, they wish for no CPR or defibrilaton but are in agreement to intubation, ACLS meds, and mechanical ventilation.  Code status changed to limited to reflect family wishes.     Kennieth Rad, AGACNP-BC [facility] Pgr: [phone] or if no answer [phone] 07/03/2017, 11:21 PM

## 2017-07-03 NOTE — ED Notes (Signed)
Pt to CT

## 2017-07-04 ENCOUNTER — Inpatient Hospital Stay (HOSPITAL_COMMUNITY): Payer: Medicare Other

## 2017-07-04 LAB — CBC
HEMATOCRIT: 25.3 % — AB (ref 36.0–46.0)
HEMOGLOBIN: 8.1 g/dL — AB (ref 12.0–15.0)
MCH: 31.5 pg (ref 26.0–34.0)
MCHC: 32 g/dL (ref 30.0–36.0)
MCV: 98.4 fL (ref 78.0–100.0)
Platelets: 316 10*3/uL (ref 150–400)
RBC: 2.57 MIL/uL — AB (ref 3.87–5.11)
RDW: 16 % — ABNORMAL HIGH (ref 11.5–15.5)
WBC: 6.2 10*3/uL (ref 4.0–10.5)

## 2017-07-04 LAB — GLUCOSE, CAPILLARY
GLUCOSE-CAPILLARY: 117 mg/dL — AB (ref 65–99)
GLUCOSE-CAPILLARY: 90 mg/dL (ref 65–99)
Glucose-Capillary: 95 mg/dL (ref 65–99)
Glucose-Capillary: 87 mg/dL (ref 65–99)
Glucose-Capillary: 96 mg/dL (ref 65–99)

## 2017-07-04 LAB — RENAL FUNCTION PANEL
ALBUMIN: 2.5 g/dL — AB (ref 3.5–5.0)
ANION GAP: 6 (ref 5–15)
BUN: 23 mg/dL — AB (ref 6–20)
CO2: 17 mmol/L — ABNORMAL LOW (ref 22–32)
Calcium: 8.4 mg/dL — ABNORMAL LOW (ref 8.9–10.3)
Chloride: 110 mmol/L (ref 101–111)
Creatinine, Ser: 1.1 mg/dL — ABNORMAL HIGH (ref 0.44–1.00)
GFR calc Af Amer: 50 mL/min — ABNORMAL LOW (ref >60)
GFR, EST NON AFRICAN AMERICAN: 44 mL/min — AB (ref >60)
GLUCOSE: 135 mg/dL — AB (ref 65–99)
PHOSPHORUS: 4.3 mg/dL (ref 2.5–4.6)
POTASSIUM: 5 mmol/L (ref 3.5–5.1)
SODIUM: 133 mmol/L — AB (ref 135–145)

## 2017-07-04 LAB — RESPIRATORY PANEL BY PCR
Adenovirus: NOT DETECTED
BORDETELLA PERTUSSIS-RVPCR: NOT DETECTED
CHLAMYDOPHILA PNEUMONIAE-RVPPCR: NOT DETECTED
CORONAVIRUS 229E-RVPPCR: NOT DETECTED
CORONAVIRUS OC43-RVPPCR: NOT DETECTED
Coronavirus HKU1: NOT DETECTED
Coronavirus NL63: NOT DETECTED
INFLUENZA A-RVPPCR: NOT DETECTED
INFLUENZA B-RVPPCR: NOT DETECTED
MYCOPLASMA PNEUMONIAE-RVPPCR: NOT DETECTED
Metapneumovirus: NOT DETECTED
PARAINFLUENZA VIRUS 1-RVPPCR: NOT DETECTED
PARAINFLUENZA VIRUS 4-RVPPCR: NOT DETECTED
Parainfluenza Virus 2: NOT DETECTED
Parainfluenza Virus 3: NOT DETECTED
RESPIRATORY SYNCYTIAL VIRUS-RVPPCR: DETECTED — AB
Rhinovirus / Enterovirus: NOT DETECTED

## 2017-07-04 LAB — PROTIME-INR
INR: 1.32
Prothrombin Time: 16.3 seconds — ABNORMAL HIGH (ref 11.4–15.2)

## 2017-07-04 LAB — STREP PNEUMONIAE URINARY ANTIGEN: STREP PNEUMO URINARY ANTIGEN: NEGATIVE

## 2017-07-04 LAB — MRSA PCR SCREENING: MRSA by PCR: POSITIVE — AB

## 2017-07-04 MED ORDER — FUROSEMIDE 10 MG/ML IJ SOLN
20.0000 mg | Freq: Once | INTRAMUSCULAR | Status: AC
  Administered 2017-07-04: 20 mg via INTRAVENOUS
  Filled 2017-07-04: qty 2

## 2017-07-04 MED ORDER — PILOCARPINE HCL 4 % OP SOLN
1.0000 [drp] | Freq: Two times a day (BID) | OPHTHALMIC | Status: DC
  Administered 2017-07-04 – 2017-07-09 (×11): 1 [drp] via OPHTHALMIC
  Filled 2017-07-04: qty 15

## 2017-07-04 MED ORDER — WARFARIN SODIUM 3 MG PO TABS
3.0000 mg | ORAL_TABLET | Freq: Once | ORAL | Status: AC
  Administered 2017-07-04: 3 mg via ORAL
  Filled 2017-07-04: qty 1

## 2017-07-04 MED ORDER — TRAVOPROST 0.004 % OP SOLN: 1.0000 [drp] | Freq: Every day | OPHTHALMIC | Status: DC

## 2017-07-04 MED ORDER — WARFARIN - PHARMACIST DOSING INPATIENT
Freq: Every day | Status: DC
  Administered 2017-07-04: 18:00:00

## 2017-07-04 MED ORDER — ROPINIROLE HCL 1 MG PO TABS
1.0000 mg | ORAL_TABLET | Freq: Every day | ORAL | Status: DC
  Administered 2017-07-04 – 2017-07-08 (×5): 1 mg via ORAL
  Filled 2017-07-04 (×5): qty 1

## 2017-07-04 MED ORDER — LEVOTHYROXINE SODIUM 100 MCG IV SOLR
50.0000 ug | Freq: Every day | INTRAVENOUS | Status: DC
  Administered 2017-07-05 – 2017-07-07 (×3): 50 ug via INTRAVENOUS
  Filled 2017-07-04 (×3): qty 5

## 2017-07-04 MED ORDER — TIMOLOL MALEATE 0.5 % OP SOLN
1.0000 [drp] | Freq: Two times a day (BID) | OPHTHALMIC | Status: DC
  Administered 2017-07-04 – 2017-07-09 (×11): 1 [drp] via OPHTHALMIC
  Filled 2017-07-04 (×3): qty 5

## 2017-07-04 MED ORDER — MUPIROCIN 2 % EX OINT
1.0000 "application " | TOPICAL_OINTMENT | Freq: Two times a day (BID) | CUTANEOUS | Status: AC
  Administered 2017-07-04 – 2017-07-08 (×10): 1 via NASAL
  Filled 2017-07-04 (×5): qty 22

## 2017-07-04 MED ORDER — PANTOPRAZOLE SODIUM 40 MG IV SOLR
40.0000 mg | INTRAVENOUS | Status: DC
  Administered 2017-07-04 – 2017-07-07 (×4): 40 mg via INTRAVENOUS
  Filled 2017-07-04 (×4): qty 40

## 2017-07-04 MED ORDER — MIRTAZAPINE 7.5 MG PO TABS
7.5000 mg | ORAL_TABLET | Freq: Every day | ORAL | Status: DC
  Administered 2017-07-04 – 2017-07-08 (×5): 7.5 mg via ORAL
  Filled 2017-07-04 (×5): qty 1

## 2017-07-04 MED ORDER — DORZOLAMIDE HCL 2 % OP SOLN
1.0000 [drp] | Freq: Two times a day (BID) | OPHTHALMIC | Status: DC
  Administered 2017-07-04 – 2017-07-09 (×11): 1 [drp] via OPHTHALMIC
  Filled 2017-07-04: qty 10

## 2017-07-04 MED ORDER — FLUOXETINE HCL 20 MG PO CAPS
20.0000 mg | ORAL_CAPSULE | Freq: Every day | ORAL | Status: DC
  Administered 2017-07-04 – 2017-07-09 (×6): 20 mg via ORAL
  Filled 2017-07-04 (×6): qty 1

## 2017-07-04 MED ORDER — ORAL CARE MOUTH RINSE
15.0000 mL | Freq: Two times a day (BID) | OROMUCOSAL | Status: DC
  Administered 2017-07-04 – 2017-07-08 (×9): 15 mL via OROMUCOSAL

## 2017-07-04 MED ORDER — CHLORHEXIDINE GLUCONATE CLOTH 2 % EX PADS
6.0000 | MEDICATED_PAD | Freq: Every day | CUTANEOUS | Status: DC
  Administered 2017-07-04: 6 via TOPICAL

## 2017-07-04 MED ORDER — CHLORHEXIDINE GLUCONATE 0.12 % MT SOLN
15.0000 mL | Freq: Two times a day (BID) | OROMUCOSAL | Status: DC
  Administered 2017-07-04 – 2017-07-09 (×11): 15 mL via OROMUCOSAL
  Filled 2017-07-04 (×7): qty 15

## 2017-07-04 MED ORDER — LATANOPROST 0.005 % OP SOLN
1.0000 [drp] | Freq: Every day | OPHTHALMIC | Status: DC
  Administered 2017-07-04 – 2017-07-08 (×5): 1 [drp] via OPHTHALMIC
  Filled 2017-07-04 (×2): qty 2.5

## 2017-07-04 MED ORDER — METHYLPREDNISOLONE SODIUM SUCC 40 MG IJ SOLR
20.0000 mg | Freq: Three times a day (TID) | INTRAMUSCULAR | Status: DC
  Administered 2017-07-04 (×2): 20 mg via INTRAVENOUS
  Filled 2017-07-04 (×4): qty 0.5

## 2017-07-04 NOTE — Progress Notes (Signed)
PULMONARY / CRITICAL CARE MEDICINE   Name: [name] MRN: [id number] DOB: [birth year]    ADMISSION DATE:  07/03/2017 CONSULTATION DATE:  07/03/2017  REFERRING MD:  Dr. Alvino Chapel  CHIEF COMPLAINT:  AMS  HISTORY OF PRESENT ILLNESS: 83 yo female presented with altered mental status from acute on chronic hypoxic/hypercapnic respiratory failure in setting of RSV, UTI and pressure ulcers. PMHx of A fib on coumadin, combined CHF, COPD on 3 to 4 liters oxygen, GERD, dysphagia, glaucoma, hypothyroidism, neuropathy, pressure ulcers, bed bound.  SUBJECTIVE:  Feels like she can't get a full breath.  VITAL SIGNS: BP 97/82   Pulse 87   Temp (!) 97.4 F (36.3 C) (Axillary)   Resp (!) 25   Wt 139 lb (63 kg)   SpO2 100%   BMI 20.53 kg/m   VENTILATOR SETTINGS: Vent Mode: BIPAP;PCV FiO2 (%):  [40 %-50 %] 40 % Set Rate:  [12 bmp] 12 bmp PEEP:  [5 cmH20] 5 cmH20  INTAKE / OUTPUT: I/O last 3 completed shifts: In: 1100 [IV Piggyback:1100] Out: -   PHYSICAL EXAMINATION:  General - cachetic Eyes - pupils reactive ENT - raspy voice, poor hearing Cardiac - irregular, no murmur Chest - b/l crackles Abd - soft, non tender Ext - decreased muscle bulk Skin - pressure ulcers on sacrum and heels Neuro - follows commands   LABS:  BMET Recent Labs  Lab 07/03/17 1533 07/03/17 1544 07/04/17 0014  NA 132* 134* 133*  K 5.1 5.0 5.0  CL 108 109 110  CO2 19*  --  17*  BUN 22* 26* 23*  CREATININE 1.21* 1.30* 1.10*  GLUCOSE 143* 145* 135*    Electrolytes Recent Labs  Lab 07/03/17 1533 07/03/17 2215 07/04/17 0014  CALCIUM 9.0  --  8.4*  MG  --  2.6*  --   PHOS  --  4.5 4.3    CBC Recent Labs  Lab 07/03/17 1533 07/03/17 1544 07/04/17 0014  WBC 7.7  --  6.2  HGB 9.2* 9.9* 8.1*  HCT 29.6* 29.0* 25.3*  PLT 382  --  316    Coag's Recent Labs  Lab 07/03/17 1533  INR 9.48*    Sepsis Markers Recent Labs  Lab 07/03/17 1545 07/03/17 1941  LATICACIDVEN 1.10 1.01     ABG Recent Labs  Lab 07/03/17 2022 07/03/17 2300  PHART 7.068* 7.213*  PCO2ART 65.9* 41.8  PO2ART 273.0* 127.0*    Liver Enzymes Recent Labs  Lab 07/03/17 1533 07/04/17 0014  AST 85*  --   ALT 32  --   ALKPHOS 145*  --   BILITOT 0.4  --   ALBUMIN 2.7* 2.5*    Cardiac Enzymes No results for input(s): TROPONINI, PROBNP in the last 168 hours.  Glucose Recent Labs  Lab 07/03/17 1515 07/03/17 2336 07/04/17 0347 07/04/17 0827  GLUCAP 142* 148* 117* 90    Imaging Ct Head Wo Contrast  Result Date: 07/03/2017 CLINICAL DATA:  Altered level of consciousness. EXAM: CT HEAD WITHOUT CONTRAST TECHNIQUE: Contiguous axial images were obtained from the base of the skull through the vertex without intravenous contrast. COMPARISON:  04/01/2017 FINDINGS: Brain: There is atrophy and chronic small vessel disease changes. No acute intracranial abnormality. Specifically, no hemorrhage, hydrocephalus, mass lesion, acute infarction, or significant intracranial injury. Vascular: No hyperdense vessel or unexpected calcification. Skull: No acute calvarial abnormality. Sinuses/Orbits: No acute finding. Other: None IMPRESSION: No acute intracranial abnormality. Atrophy, chronic microvascular disease. Electronically Signed   By: Rolm Baptise M.D.  On: 07/03/2017 16:06   Dg Chest Port 1 View  Result Date: 07/04/2017 CLINICAL DATA:  83 year old female with acute respiratory failure and shortness of breath. EXAM: PORTABLE CHEST 1 VIEW COMPARISON:  Chest radiograph dated 07/03/2017 FINDINGS: There is stable cardiomegaly. Small bilateral pleural effusions and associated bibasilar atelectasis for infiltrate. Overall the aeration of the lungs is similar or slightly improved compared to the earlier radiograph. There is mild bronchiectatic changes. No pneumothorax. Atherosclerotic calcification of the aortic arch. No acute osseous pathology. IMPRESSION: Small bilateral pleural effusions and bibasilar  atelectasis versus infiltrate. Overall slight interval improvement in aeration of the lungs compared to the earlier radiograph. Electronically Signed   By: Anner Crete M.D.   On: 07/04/2017 02:19   Dg Chest Portable 1 View  Result Date: 07/03/2017 CLINICAL DATA:  Altered mental status and shortness of breath. EXAM: PORTABLE CHEST 1 VIEW COMPARISON:  Chest x-ray dated 04/06/2017. FINDINGS: Stable cardiomegaly. Atherosclerotic changes noted at the aortic arch. Vague opacities at each lung base, likely small pleural effusions and/or atelectasis. IMPRESSION: 1. Stable cardiomegaly. 2. Probable small bilateral pleural effusions and/or atelectasis. 3. Aortic atherosclerosis. Electronically Signed   By: Franki Cabot M.D.   On: 07/03/2017 15:31   STUDIES:  07/03/17 CT head >> no acute abnormality; atrophy; chronic microvascular disease  CULTURES: 11/17 BCx 2 >> 11/17 UC >> 11/17 RVP >> RSV 11/17 Pneumococcal Ag >>   ANTIBIOTICS: 11/17 Vancomycin >> 11/17 Meropenem >>  SIGNIFICANT EVENTS: 11/17 Admit  LINES/TUBES: PIV x 2   ASSESSMENT / PLAN:  Acute on chronic respiratory failure with hypoxia, hypercapnia. RSV pneumonia. COPD with exacerbation. - oxygen to keep SpO2 88 to 95% - Bipap prn - solumedrol 20 mg q8h - scheduled BDs - f/u CXR  Acute on chronic diastolic/systolic CHF. Hx of A fib, HTN. - lasix 20 mg IV x one - hold coumadin, f/u INR  CKD 2. - monitor renal fx  UTI. Sacral, heel pressure ulcers >> present prior to admission. - wound care - continue Abx - f/u cultures  Anemia of chronic disease. - f/u CBC  Hypothyroidism. - increase synthroid to 50 mcg IV daily  Acute metabolic encephalopathy. - monitor mental status  DVT prophylaxis - chronic coumadin SUP - Protonix Nutrition - NPO except meds Goals of care - okay with intubation.  No CPR or defibrillation.  CC time 34 minutes  Chesley Mires, MD [place] 07/04/2017, 11:06  AM Pager:  [phone] After 3pm call: [phone]

## 2017-07-04 NOTE — Progress Notes (Signed)
[facility] Progress Note Patient Name: [name] DOB: August 14, 1927 MRN: [id number]   Date of Service  07/04/2017  HPI/Events of Note  Altered LOC and increased WOB - Patient is currently on BiPAP.   eICU Interventions  Will have nurse send the following labs that are already ordered and not done: 1. Portable CXR STAT (Ordered for midnight). 2. ABG STAT (Ordered for 1026 PM yesterday).     Intervention Category Intermediate Interventions: Respiratory distress - evaluation and management  [place]mer,Steven Eugene 07/04/2017, 1:39 AM

## 2017-07-04 NOTE — Progress Notes (Signed)
ANTICOAGULATION CONSULT NOTE - Initial Consult  Pharmacy Consult for warfarin Indication: atrial fibrillation  No Known Allergies  Patient Measurements: Weight: 139 lb (63 kg)  Vital Signs: Temp: 97.6 F (36.4 C) (11/18 1231) Temp Source: Axillary (11/18 1231) BP: 106/66 (11/18 1526) Pulse Rate: 83 (11/18 1526)  Labs: Recent Labs    07/03/17 1533 07/03/17 1544 07/04/17 0014 07/04/17 1329  HGB 9.2* 9.9* 8.1*  --   HCT 29.6* 29.0* 25.3*  --   PLT 382  --  316  --   LABPROT 75.9*  --   --  16.3*  INR 9.48*  --   --  1.32  CREATININE 1.21* 1.30* 1.10*  --     Estimated Creatinine Clearance: 35.2 mL/min (A) (by C-G formula based on SCr of 1.1 mg/dL (H)).  Assessment: 47 yof on chronic coumadin initially presented with a supratherapeutic INR of 9.48. This was reversed with IV vitamin K 5mg  on 11/17. INR now down to 1.32. CBC is stable and no bleeding noted.   Goal of Therapy:  INR 2-3 Monitor platelets by anticoagulation protocol: Yes   Plan:  Warfarin 3mg  PO x 1 tonight Daily INR  Jorgina Binning, Rande Lawman 07/04/2017,3:30 PM

## 2017-07-05 ENCOUNTER — Inpatient Hospital Stay (HOSPITAL_COMMUNITY): Payer: Medicare Other

## 2017-07-05 LAB — BASIC METABOLIC PANEL
ANION GAP: 7 (ref 5–15)
BUN: 28 mg/dL — ABNORMAL HIGH (ref 6–20)
CHLORIDE: 110 mmol/L (ref 101–111)
CO2: 18 mmol/L — ABNORMAL LOW (ref 22–32)
Calcium: 8.5 mg/dL — ABNORMAL LOW (ref 8.9–10.3)
Creatinine, Ser: 1.1 mg/dL — ABNORMAL HIGH (ref 0.44–1.00)
GFR calc Af Amer: 50 mL/min — ABNORMAL LOW (ref >60)
GFR, EST NON AFRICAN AMERICAN: 44 mL/min — AB (ref >60)
GLUCOSE: 97 mg/dL (ref 65–99)
POTASSIUM: 4.6 mmol/L (ref 3.5–5.1)
SODIUM: 135 mmol/L (ref 135–145)

## 2017-07-05 LAB — GLUCOSE, CAPILLARY
GLUCOSE-CAPILLARY: 89 mg/dL (ref 65–99)
GLUCOSE-CAPILLARY: 101 mg/dL — AB (ref 65–99)
GLUCOSE-CAPILLARY: 100 mg/dL — AB (ref 65–99)
Glucose-Capillary: 102 mg/dL — ABNORMAL HIGH (ref 65–99)
Glucose-Capillary: 82 mg/dL (ref 65–99)

## 2017-07-05 LAB — CBC
HEMATOCRIT: 22.9 % — AB (ref 36.0–46.0)
HEMOGLOBIN: 7.3 g/dL — AB (ref 12.0–15.0)
MCH: 30.7 pg (ref 26.0–34.0)
MCHC: 31.9 g/dL (ref 30.0–36.0)
MCV: 96.2 fL (ref 78.0–100.0)
Platelets: 286 10*3/uL (ref 150–400)
RBC: 2.38 MIL/uL — ABNORMAL LOW (ref 3.87–5.11)
RDW: 16.1 % — ABNORMAL HIGH (ref 11.5–15.5)
WBC: 10.3 10*3/uL (ref 4.0–10.5)

## 2017-07-05 LAB — TYPE AND SCREEN
ABO/RH(D): A POS
ANTIBODY SCREEN: NEGATIVE

## 2017-07-05 LAB — PROTIME-INR
INR: 1.12
Prothrombin Time: 14.3 seconds (ref 11.4–15.2)

## 2017-07-05 LAB — HEMOGLOBIN AND HEMATOCRIT, BLOOD
HCT: 24 % — ABNORMAL LOW (ref 36.0–46.0)
Hemoglobin: 7.6 g/dL — ABNORMAL LOW (ref 12.0–15.0)

## 2017-07-05 MED ORDER — MUPIROCIN CALCIUM 2 % EX CREA
TOPICAL_CREAM | Freq: Every day | CUTANEOUS | Status: DC
  Administered 2017-07-05 – 2017-07-06 (×2): 1 via TOPICAL
  Administered 2017-07-07 – 2017-07-09 (×3): via TOPICAL
  Filled 2017-07-05: qty 15

## 2017-07-05 MED ORDER — CHLORHEXIDINE GLUCONATE CLOTH 2 % EX PADS
6.0000 | MEDICATED_PAD | Freq: Every day | CUTANEOUS | Status: AC
  Administered 2017-07-05 – 2017-07-08 (×4): 6 via TOPICAL

## 2017-07-05 MED ORDER — COLLAGENASE 250 UNIT/GM EX OINT
TOPICAL_OINTMENT | Freq: Every day | CUTANEOUS | Status: DC
  Administered 2017-07-05 – 2017-07-06 (×2): 1 via TOPICAL
  Administered 2017-07-07 – 2017-07-08 (×2): via TOPICAL
  Administered 2017-07-09: 1 via TOPICAL
  Filled 2017-07-05 (×2): qty 30

## 2017-07-05 MED ORDER — METHYLPREDNISOLONE SODIUM SUCC 40 MG IJ SOLR
20.0000 mg | Freq: Two times a day (BID) | INTRAMUSCULAR | Status: DC
  Administered 2017-07-05 – 2017-07-08 (×6): 20 mg via INTRAVENOUS
  Filled 2017-07-05 (×5): qty 1
  Filled 2017-07-05: qty 0.5

## 2017-07-05 NOTE — Progress Notes (Signed)
PULMONARY / CRITICAL CARE MEDICINE   Name: [name] MRN: [id number] DOB: [birth year]    ADMISSION DATE:  07/03/2017 CONSULTATION DATE:  07/03/2017  REFERRING MD:  Dr. Alvino Chapel  CHIEF COMPLAINT:  AMS  HISTORY OF PRESENT ILLNESS: 83 yo female presented with altered mental status from acute on chronic hypoxic/hypercapnic respiratory failure in setting of RSV, UTI and pressure ulcers. PMHx of A fib on coumadin, combined CHF, COPD on 3 to 4 liters oxygen, GERD, dysphagia, glaucoma, hypothyroidism, neuropathy, pressure ulcers, bed bound.  SUBJECTIVE:  No acute issues.    VITAL SIGNS: BP 128/79   Pulse 97   Temp (!) 97.5 F (36.4 C) (Oral) Comment: Apolonio Schneiders, RN notified  Resp (!) 29   Wt 63 kg (138 lb 14.2 oz)   SpO2 99%   BMI 20.51 kg/m   VENTILATOR SETTINGS: Vent Mode: BIPAP FiO2 (%):  [50 %] 50 % Set Rate:  [12 bmp] 12 bmp PEEP:  [5 cmH20] 5 cmH20  INTAKE / OUTPUT: I/O last 3 completed shifts: In: 200 [IV Piggyback:200] Out: 71 [Urine:980]  PHYSICAL EXAMINATION: General: Elderly female, frail, resting in bed, in NAD. Neuro: A&O x 3, non-focal.  HEENT: [place]/AT. PERRL, sclerae anicteric. Cardiovascular: RRR, no M/R/G.  Lungs: Respirations even and unlabored.  Coarse bilaterally. Abdomen: BS x 4, soft, NT/ND.  Musculoskeletal: No gross deformities, no edema.  Skin: Intact, warm, no rashes.   LABS:  BMET Recent Labs  Lab 07/03/17 1533 07/03/17 1544 07/04/17 0014 07/05/17 0334  NA 132* 134* 133* 135  K 5.1 5.0 5.0 4.6  CL 108 109 110 110  CO2 19*  --  17* 18*  BUN 22* 26* 23* 28*  CREATININE 1.21* 1.30* 1.10* 1.10*  GLUCOSE 143* 145* 135* 97    Electrolytes Recent Labs  Lab 07/03/17 1533 07/03/17 2215 07/04/17 0014 07/05/17 0334  CALCIUM 9.0  --  8.4* 8.5*  MG  --  2.6*  --   --   PHOS  --  4.5 4.3  --     CBC Recent Labs  Lab 07/03/17 1533 07/03/17 1544 07/04/17 0014 07/05/17 0334  WBC 7.7  --  6.2 10.3  HGB 9.2* 9.9* 8.1* 7.3*  HCT  29.6* 29.0* 25.3* 22.9*  PLT 382  --  316 286    Coag's Recent Labs  Lab 07/03/17 1533 07/04/17 1329 07/05/17 0334  INR 9.48* 1.32 1.12    Sepsis Markers Recent Labs  Lab 07/03/17 1545 07/03/17 1941  LATICACIDVEN 1.10 1.01    ABG Recent Labs  Lab 07/03/17 2022 07/03/17 2300  PHART 7.068* 7.213*  PCO2ART 65.9* 41.8  PO2ART 273.0* 127.0*    Liver Enzymes Recent Labs  Lab 07/03/17 1533 07/04/17 0014  AST 85*  --   ALT 32  --   ALKPHOS 145*  --   BILITOT 0.4  --   ALBUMIN 2.7* 2.5*    Cardiac Enzymes No results for input(s): TROPONINI, PROBNP in the last 168 hours.  Glucose Recent Labs  Lab 07/04/17 1227 07/04/17 1620 07/04/17 1943 07/05/17 0053 07/05/17 0350 07/05/17 0812  GLUCAP 95 87 96 102* 89 101*    Imaging Dg Chest Port 1 View  Result Date: 07/05/2017 CLINICAL DATA:  Respiratory failure EXAM: PORTABLE CHEST 1 VIEW COMPARISON:  07/04/2017 FINDINGS: Left lower lobe consolidation unchanged. Mild right lower lobe atelectasis/ infiltrate unchanged. Progression of mild patchy right upper lobe infiltrate since prior study. Negative for heart failure. IMPRESSION: Bibasilar airspace disease unchanged. Progression of mild right upper lobe airspace  disease. Electronically Signed   By: Franchot Gallo M.D.   On: 07/05/2017 07:03   STUDIES:  07/03/17 CT head >> no acute abnormality; atrophy; chronic microvascular disease  CULTURES: 11/17 BCx 2 >> 11/17 UC >> 11/17 RVP >> RSV 11/17 Pneumococcal Ag >>   ANTIBIOTICS: 11/17 Vancomycin >> 11/17 Meropenem >>  SIGNIFICANT EVENTS: 11/17 Admit  LINES/TUBES: PIV x 2   ASSESSMENT / PLAN:  Acute on chronic respiratory failure with hypoxia, hypercapnia. RSV pneumonia. COPD with exacerbation. - oxygen to keep SpO2 88 to 95% - Bipap prn - solumedrol 20 mg q8h - dropped to q12h 11/19 - scheduled BDs - f/u CXR  Acute on chronic diastolic/systolic CHF. Hx of A fib, HTN. - continue coumadin per  pharmacy  CKD 2. - monitor renal fx  UTI. Sacral, heel pressure ulcers >> present prior to admission. - wound care - continue Abx - f/u cultures  Anemia of chronic disease. - f/u CBC - Transfuse for Hgb < 7  Hypothyroidism. - increase synthroid to 50 mcg IV daily  Dysphagia. - SLP consulted, planning for MBS  Acute metabolic encephalopathy - resolved. - no further interventions required  DVT prophylaxis - chronic coumadin SUP - Protonix Nutrition - MBS pending Goals of care - okay with intubation.  No CPR or defibrillation.   Transfer out of of ICU to med-surge today.  Will ask TRH to assume care starting AM 11/20 and PCCM off at that time.   Montey Hora, [facility] Pager: [phone]  or [phone] 07/05/2017, 11:15 AM

## 2017-07-05 NOTE — Consult Note (Signed)
[facility] Nurse wound consult note Reason for Consult: Consult requested for bilat legs. Wound type: Left outer ankle with .2X.2cm deep tissue injury, dark reddish purple. Left heel stage 2 pressure injury; clear fluid filled blister, 2X2cm Left great toe with a full thickness wound; .2X.4X.2cm, dark red dry wound, no odor or drainage Right great toe with a full thickness wound; .8X.X.2X.2cm, dark red dry wound, no odor or drainage Right 2nd toe with a full thickness wound; .2X.X.2X.2cm, dark red dry wound, no odor or drainage Right outer ankle with unstageable pressure injury; 5X1cm, dry eschar, no odor or drainage Right heel with unstageable pressure injury; 1.2X1.2X.2cm, yellow slough, no odor, small amt tan drainage. Pressure Injury POA: Yes Dressing procedure/placement/frequency: Prevalon boots to reduce pressure.  Santyl ointment to provide enzymatic debridement of nonviable tissue to right heel and right outer ankle.  Bactroban to promote moist healing to bilat toes. Foam dressing to left ankle and heel to protect from further injury. Discussed plan of care with patient, no family present to discuss plan of care. Please re-consult if further assistance is needed.  Thank-you,  Julien Girt MSN, [place], [place], [place], [place]

## 2017-07-05 NOTE — Progress Notes (Signed)
Pt not currently on BIPAP, RT will continue to monitor.

## 2017-07-05 NOTE — Progress Notes (Signed)
Patient's daughter found dentures at her belongings bag that she took home.

## 2017-07-05 NOTE — Progress Notes (Signed)
Modified Barium Swallow Progress Note  Patient Details  Name: [name] MRN: [id number] Date of Birth: 03-27-1927  Today's Date: 07/05/2017  Modified Barium Swallow completed.  Full report located under Chart Review in the Imaging Section.  Brief recommendations include the following:  Clinical Impression  Swallow function appears similar to presentation during last assessment in February 2018.  Pt with mild oral discoordination.  Pharyngeal swallow triggers inferior to level of valleculae, with trace material entering the laryngeal vestibule prior to complete vestibular closure.  However, penetrate is ejected upon completion of swallow, and never reaches the vocal folds or below.  There was no aspiration.  Esophageal sweep reveals barium stasis in cervical esophagus with solids and liquids, similar to prior findings.  There was good pharyngeal clearance of materials through the UES.  While there was no aspiration during the swallow, pt remains at risk for esophageal contents to backflow into pharynx and larynx.  Recommend resuming prior diet - dysphagia 2, thin liquids- crush meds in puree. Continue to reenforce esophageal strategies; will f/u for family education.    Swallow Evaluation Recommendations       SLP Diet Recommendations: Dysphagia 2 (Fine chop) solids;Thin liquid   Liquid Administration via: Cup;Straw   Medication Administration: Crushed with puree   Supervision: Patient able to self feed   Compensations: Minimize environmental distractions;Follow solids with liquid   Postural Changes: Remain semi-upright after after feeds/meals (Comment);Seated upright at 90 degrees   Oral Care Recommendations: Oral care BID        [name] 07/05/2017,2:58 PM

## 2017-07-05 NOTE — Progress Notes (Signed)
Received patient from 75[facility], family at bedside ask me if they sent her missing dentures and earrings with her. Notified 75[facility] nurse and claimed that she gave her earrings back to patient's daughter and that she did not come to them with dentures. ED receptionist Anderson Malta was also notified about the complaint but nothing was documented about it, she will refer to security dept.

## 2017-07-05 NOTE — Evaluation (Signed)
Clinical/Bedside Swallow Evaluation Patient Details  Name: [name] MRN: [id number] Date of Birth: 12-09-1926  Today's Date: 07/05/2017 Time: SLP Start Time (ACUTE ONLY): 1045 SLP Stop Time (ACUTE ONLY): 1100 SLP Time Calculation (min) (ACUTE ONLY): 15 min  Past Medical History:  Past Medical History:  Diagnosis Date  . Atrial fibrillation (HCC)    chronic Coumadin.   . CHF (congestive heart failure) ([place])   . COPD (chronic obstructive pulmonary disease) ([place])   . Glaucoma   . Hypothyroidism   . Neuropathy    PERIPHERAL  . Osteoarthritis   . Osteoporosis   . Trimalleolar fracture    LEFT TRIMALLEOLAR FRACTURE, DISLOCATION WITH OBLIQUE FRACTURE OF THE DISTAL FIBULAR DIAPHYSIS, AND NOTED BEING MILDLY COMMINUTED   Past Surgical History:  Past Surgical History:  Procedure Laterality Date  . COLONOSCOPY W/ POLYPECTOMY  11/2010   Dr Fuller Plan.  multiple polyps:cecal, transverse, sigmoid.  largest 42mm, path: tubular adenomas without HGD.  mild sigmoid diverticulosis.   Marland Kitchen ORIF TIBIA & FIBULA FRACTURES  2016  . VAGINAL HYSTERECTOMY     HPI:  83 year old female with PMH of Afib on coumadin, combined HF, COPD,former smoker,chronic respiratory failure on 3-4L [place],dysphagia, GERD,glaucoma w/anisocoria, hypothyroidsim, peripheral neuropathy, and chronic pressure ulcers who presented with acute lethargy on 11/17. Dx acute-on-chronic resp failure with hypoxia, hypercapnia. Cared for at home by dtr, Madaline Savage and [facility] aid.  Progressive decline last several months.  Documented dysphagia, primarily esophageal in nature, with reflux to level of pharynx and distal stricture per Feb 2018 esophagram.  Pt known to SLP services from prior admissions; last evaluation was bedside evaluation Aug 2018.  Family education focused on compensating as able for esophageal deficits.  Pt was D/Cd on dysphagia 2 diet with thin liquids.    Assessment / Plan / Recommendation Clinical Impression  Pt presents with s/s of  an acute dysphagia with consistent cough response after consumption of thin liquids; ongoing complaints of early satiety; notable belching.  Given documented hx of dysphagia with acute changes, recommend proceeding with MBS to identify nature of new deficits and assist family with decision-making.  D/W RN.  SLP Visit Diagnosis: Dysphagia, unspecified (R13.10)    Aspiration Risk       Diet Recommendation   Pending MBS       Other  Recommendations Oral Care Recommendations: Oral care QID   Follow up Recommendations        Frequency and Duration            Prognosis        Swallow Study   General HPI: 83 year old female with PMH of Afib on coumadin, combined HF, COPD,former smoker,chronic respiratory failure on 3-4L ,dysphagia, GERD,glaucoma w/anisocoria, hypothyroidsim, peripheral neuropathy, and chronic pressure ulcers who presented with acute lethargy on 11/17. Dx acute-on-chronic resp failure with hypoxia, hypercapnia. Cared for at home by dtr, Madaline Savage and [facility] aid.  Progressive decline last several months.  Documented dysphagia, primarily esophageal in nature, with reflux to level of pharynx and distal stricture per Feb 2018 esophagram.  Pt known to SLP services from prior admissions; last evaluation was bedside evaluation Aug 2018.  Family education focused on compensating as able for esophageal deficits.  Pt was D/Cd on dysphagia 2 diet with thin liquids.  Type of Study: Bedside Swallow Evaluation Previous Swallow Assessment: see HPI Diet Prior to this Study: NPO Temperature Spikes Noted: No Respiratory Status: Nasal cannula History of Recent Intubation: No Behavior/Cognition: Alert;Confused Oral Cavity Assessment: Within Functional  Limits Oral Care Completed by SLP: No Oral Cavity - Dentition: Edentulous Vision: Functional for self-feeding Self-Feeding Abilities: Able to feed self;Needs set up Patient Positioning: Upright in bed Baseline Vocal Quality: Other  (comment)(high pitch) Volitional Cough: Strong Volitional Swallow: Able to elicit    Oral/Motor/Sensory Function Overall Oral Motor/Sensory Function: Within functional limits   Ice Chips Ice chips: Within functional limits   Thin Liquid Thin Liquid: Impaired Presentation: Cup Pharyngeal  Phase Impairments: Throat Clearing - Immediate;Cough - Immediate    Nectar Thick Nectar Thick Liquid: Not tested   Honey Thick Honey Thick Liquid: Not tested   Puree Puree: Impaired Presentation: Spoon Pharyngeal Phase Impairments: Multiple swallows   Solid   GO   Solid: Not tested        [name] 07/05/2017,11:17 AM  [name], [place] CCC/SLP Pager [phone]

## 2017-07-06 DIAGNOSIS — I5032 Chronic diastolic (congestive) heart failure: Secondary | ICD-10-CM

## 2017-07-06 LAB — GLUCOSE, CAPILLARY
GLUCOSE-CAPILLARY: 76 mg/dL (ref 65–99)
GLUCOSE-CAPILLARY: 88 mg/dL (ref 65–99)
Glucose-Capillary: 88 mg/dL (ref 65–99)
Glucose-Capillary: 114 mg/dL — ABNORMAL HIGH (ref 65–99)

## 2017-07-06 LAB — BASIC METABOLIC PANEL
ANION GAP: 5 (ref 5–15)
BUN: 23 mg/dL — ABNORMAL HIGH (ref 6–20)
CALCIUM: 8.8 mg/dL — AB (ref 8.9–10.3)
CO2: 19 mmol/L — AB (ref 22–32)
Chloride: 112 mmol/L — ABNORMAL HIGH (ref 101–111)
Creatinine, Ser: 0.77 mg/dL (ref 0.44–1.00)
GFR calc non Af Amer: >60 mL/min (ref >60)
GLUCOSE: 79 mg/dL (ref 65–99)
POTASSIUM: 4.6 mmol/L (ref 3.5–5.1)
Sodium: 136 mmol/L (ref 135–145)

## 2017-07-06 LAB — URINE CULTURE: Culture: >=100000 — AB

## 2017-07-06 LAB — LEGIONELLA PNEUMOPHILA SEROGP 1 UR AG: L. pneumophila Serogp 1 Ur Ag: NEGATIVE

## 2017-07-06 LAB — CBC
HEMATOCRIT: 26.4 % — AB (ref 36.0–46.0)
HEMOGLOBIN: 8.4 g/dL — AB (ref 12.0–15.0)
MCH: 30.3 pg (ref 26.0–34.0)
MCHC: 31.8 g/dL (ref 30.0–36.0)
MCV: 95.3 fL (ref 78.0–100.0)
Platelets: 280 10*3/uL (ref 150–400)
RBC: 2.77 MIL/uL — AB (ref 3.87–5.11)
RDW: 16.2 % — ABNORMAL HIGH (ref 11.5–15.5)
WBC: 10.7 10*3/uL — ABNORMAL HIGH (ref 4.0–10.5)

## 2017-07-06 LAB — MAGNESIUM: MAGNESIUM: 2.2 mg/dL (ref 1.7–2.4)

## 2017-07-06 LAB — PROTIME-INR
INR: 1.18
Prothrombin Time: 14.9 seconds (ref 11.4–15.2)

## 2017-07-06 LAB — PHOSPHORUS: PHOSPHORUS: 1.6 mg/dL — AB (ref 2.5–4.6)

## 2017-07-06 MED ORDER — WARFARIN - PHARMACIST DOSING INPATIENT
Freq: Every day | Status: DC
  Administered 2017-07-08: 17:00:00

## 2017-07-06 MED ORDER — WARFARIN SODIUM 3 MG PO TABS
3.0000 mg | ORAL_TABLET | Freq: Once | ORAL | Status: AC
  Administered 2017-07-06: 3 mg via ORAL
  Filled 2017-07-06: qty 1

## 2017-07-06 NOTE — Progress Notes (Signed)
PROGRESS NOTE    [name]  FIE:[id number] DOB: [birth year] DOA: 07/03/2017 PCP: Adrienne Roers, [name]   Brief Narrative: [name] is a 83 y.o. female with a history of chronic failure, atrial fibrillation, hypothyroidism, chronic diastolic heart failure, COPD.  Patient presented with symptoms of confusion. She was started on treatment for pneumonia and UTI.  Respiratory virus panel was significant for a positive RSV result in addition to her urine culture growing Proteus mirabilis.  He is currently on meropenem and weaned from BiPAP to nasal cannula.  She uses 3 L of nasal cannula as an outpatient.  She is chronically bedbound.  She has had some complication no worsening anemia which is now stabilized.  She is on Coumadin for chronic atrial fibrillation and is bedbound.   Assessment & Plan:   Active Problems:   Atrial fibrillation (HCC)   Hypothyroidism   Chronic diastolic CHF (congestive heart failure) (HCC)   COPD with acute exacerbation (HCC)   Acute encephalopathy   Acute respiratory failure with hypoxia and hypercapnia (HCC)   Acute encephalopathy Closer to baseline.  -SLP recommendations  RSV pneumonitis/pneumonia Acute on chronic respiratory with hypoxia and hypercapnia COPD exacerbation Patient weaned off of Bipap and is now on nasal canula. Patient on 3L chronically. -continue Oxygen thera keep SPO2 greater than 80%py to -Continue Solu-Medrol -Continue bronchodilators  UTI Urine culture significant for Proteus mirabilis, sensitive to imipenem, Bactrim, Zosyn. -Continue meropenem; discontinue vancomycin.  Anemia Chronic. Stable and improved.  Hypothyroidism -Continue Synthroid  Chronic systolic congestive heart failure Last EF of 30-35% with mid anteroseptal, inferoseptal, and inferior akinesis.  She has biatrial enlargement.  No current exacerbation. -Continue metoprolol  Atrial fibrillation Patient on Coumadin as an outpatient.  This was discontinued  secondary to worsening anemia of unknown etiology. Hemoglobin stable today without bleeding source. Patient does not ambulate and is therefore low risk for falls. -restart Coumadin  Multiple pressure ulcers -wound care recommendations   DVT prophylaxis: Warfarin Code Status: Partial code (DNR but intubation is OK) Family Communication: Daughter on the telephone Disposition Plan: When medically stable   Consultants:   None  Procedures:   BiPAP  Antimicrobials:  Vancomycin (11/17>>11/20)   Meropenem (11/17>>   Subjective: Feels cold.  Objective: Vitals:   07/05/17 1214 07/05/17 1310 07/06/17 0457 07/06/17 1013  BP:  140/84 124/60   Pulse:  80 78   Resp:  14 20   Temp: (!) 97.5 F (36.4 C) 97.7 F (36.5 C) 98.4 F (36.9 C)   TempSrc: Oral Oral Oral   SpO2:  (!) 88% 96%   Weight:    55.8 kg (123 lb 0.3 oz)  Height:    5\' 7"  (1.702 m)    Intake/Output Summary (Last 24 hours) at 07/06/2017 1236 Last data filed at 07/06/2017 1025 Gross per 24 hour  Intake 1220 ml  Output 100 ml  Net 1120 ml   Filed Weights   07/03/17 1511 07/04/17 2000 07/06/17 1013  Weight: 63 kg (139 lb) 63 kg (138 lb 14.2 oz) 55.8 kg (123 lb 0.3 oz)    Examination:  General exam: Appears calm and comfortable Respiratory system: Wheezing bilaterally with diminished breath sounds. Respiratory effort slightly increased. Cardiovascular system: S1 & S2 heard, irregular rhythm, normal rate. No murmurs. Gastrointestinal system: Abdomen is nondistended, soft and nontender. Normal bowel sounds heard. Central nervous system: Alert and oriented to person only. No focal neurological deficits. Extremities: No edema. No calf tenderness Skin: No cyanosis. No rashes Psychiatry: Judgement  and insight appear normal. Mood & affect appropriate.     Data Reviewed: I have personally reviewed following labs and imaging studies  CBC: Recent Labs  Lab 07/03/17 1533 07/03/17 1544 07/04/17 0014  07/05/17 0334 07/05/17 1818 07/06/17 0604  WBC 7.7  --  6.2 10.3  --  10.7*  NEUTROABS 6.8  --   --   --   --   --   HGB 9.2* 9.9* 8.1* 7.3* 7.6* 8.4*  HCT 29.6* 29.0* 25.3* 22.9* 24.0* 26.4*  MCV 97.7  --  98.4 96.2  --  95.3  PLT 382  --  316 286  --  308   Basic Metabolic Panel: Recent Labs  Lab 07/03/17 1533 07/03/17 1544 07/03/17 2215 07/04/17 0014 07/05/17 0334 07/06/17 0604  NA 132* 134*  --  133* 135 136  K 5.1 5.0  --  5.0 4.6 4.6  CL 108 109  --  110 110 112*  CO2 19*  --   --  17* 18* 19*  GLUCOSE 143* 145*  --  135* 97 79  BUN 22* 26*  --  23* 28* 23*  CREATININE 1.21* 1.30*  --  1.10* 1.10* 0.77  CALCIUM 9.0  --   --  8.4* 8.5* 8.8*  MG  --   --  2.6*  --   --  2.2  PHOS  --   --  4.5 4.3  --  1.6*   GFR: Estimated Creatinine Clearance: 42.8 mL/min (by C-G formula based on SCr of 0.77 mg/dL). Liver Function Tests: Recent Labs  Lab 07/03/17 1533 07/04/17 0014  AST 85*  --   ALT 32  --   ALKPHOS 145*  --   BILITOT 0.4  --   PROT 6.9  --   ALBUMIN 2.7* 2.5*   No results for input(s): LIPASE, AMYLASE in the last 168 hours. No results for input(s): AMMONIA in the last 168 hours. Coagulation Profile: Recent Labs  Lab 07/03/17 1533 07/04/17 1329 07/05/17 0334 07/06/17 0604  INR 9.48* 1.32 1.12 1.18   Cardiac Enzymes: No results for input(s): CKTOTAL, CKMB, CKMBINDEX, TROPONINI in the last 168 hours. BNP (last 3 results) No results for input(s): PROBNP in the last 8760 hours. HbA1C: No results for input(s): HGBA1C in the last 72 hours. CBG: Recent Labs  Lab 07/05/17 2223 07/06/17 0113 07/06/17 0445 07/06/17 0739 07/06/17 1216  GLUCAP 100* 88 76 88 114*   Lipid Profile: No results for input(s): CHOL, HDL, LDLCALC, TRIG, CHOLHDL, LDLDIRECT in the last 72 hours. Thyroid Function Tests: Recent Labs    07/03/17 2215  TSH 15.374*   Anemia Panel: No results for input(s): VITAMINB12, FOLATE, FERRITIN, TIBC, IRON, RETICCTPCT in the last 72  hours. Sepsis Labs: Recent Labs  Lab 07/03/17 1545 07/03/17 1941  LATICACIDVEN 1.10 1.01    Recent Results (from the past 240 hour(s))  Urine culture     Status: Abnormal   Collection Time: 07/03/17  6:07 PM  Result Value Ref Range Status   Specimen Description URINE, CATHETERIZED  Final   Special Requests NONE  Final   Culture >=100,000 COLONIES/mL PROTEUS MIRABILIS (A)  Final   Report Status 07/06/2017 FINAL  Final   Organism ID, Bacteria PROTEUS MIRABILIS (A)  Final      Susceptibility   Proteus mirabilis - MIC*    AMPICILLIN >=32 RESISTANT Resistant     CEFAZOLIN >=64 RESISTANT Resistant     CEFTRIAXONE >=64 RESISTANT Resistant     CIPROFLOXACIN >=4 RESISTANT  Resistant     GENTAMICIN <=1 SENSITIVE Sensitive     IMIPENEM <=0.25 SENSITIVE Sensitive     NITROFURANTOIN >=512 RESISTANT Resistant     TRIMETH/SULFA >=320 RESISTANT Resistant     AMPICILLIN/SULBACTAM 4 SENSITIVE Sensitive     PIP/TAZO <=4 SENSITIVE Sensitive     * >=100,000 COLONIES/mL PROTEUS MIRABILIS  Respiratory Panel by PCR     Status: Abnormal   Collection Time: 07/03/17  9:00 PM  Result Value Ref Range Status   Adenovirus NOT DETECTED NOT DETECTED Final   Coronavirus 229E NOT DETECTED NOT DETECTED Final   Coronavirus HKU1 NOT DETECTED NOT DETECTED Final   Coronavirus NL63 NOT DETECTED NOT DETECTED Final   Coronavirus OC43 NOT DETECTED NOT DETECTED Final   Metapneumovirus NOT DETECTED NOT DETECTED Final   Rhinovirus / Enterovirus NOT DETECTED NOT DETECTED Final   Influenza A NOT DETECTED NOT DETECTED Final   Influenza B NOT DETECTED NOT DETECTED Final   Parainfluenza Virus 1 NOT DETECTED NOT DETECTED Final   Parainfluenza Virus 2 NOT DETECTED NOT DETECTED Final   Parainfluenza Virus 3 NOT DETECTED NOT DETECTED Final   Parainfluenza Virus 4 NOT DETECTED NOT DETECTED Final   Respiratory Syncytial Virus DETECTED (A) NOT DETECTED Final    Comment: CRITICAL RESULT CALLED TO, READ BACK BY AND VERIFIED  WITH: B. SHEPPARD,RN [id number] 07/04/2017 T. TYSOR    Bordetella pertussis NOT DETECTED NOT DETECTED Final   Chlamydophila pneumoniae NOT DETECTED NOT DETECTED Final   Mycoplasma pneumoniae NOT DETECTED NOT DETECTED Final  Culture, blood (routine x 2)     Status: None (Preliminary result)   Collection Time: 07/03/17 10:04 PM  Result Value Ref Range Status   Specimen Description BLOOD LEFT HAND  Final   Special Requests IN PEDIATRIC BOTTLE Blood Culture adequate volume  Final   Culture NO GROWTH 3 DAYS  Final   Report Status PENDING  Incomplete  MRSA PCR Screening     Status: Abnormal   Collection Time: 07/04/17 12:12 AM  Result Value Ref Range Status   MRSA by PCR POSITIVE (A) NEGATIVE Final    Comment:        The GeneXpert MRSA Assay (FDA approved for NASAL specimens only), is one component of a comprehensive MRSA colonization surveillance program. It is not intended to diagnose MRSA infection nor to guide or monitor treatment for MRSA infections. RESULT CALLED TO, READ BACK BY AND VERIFIED WITH: SSonnie Alamo 0215 07/04/2017 T. TYSOR   Culture, blood (routine x 2)     Status: None (Preliminary result)   Collection Time: 07/04/17 12:14 AM  Result Value Ref Range Status   Specimen Description BLOOD RIGHT HAND  Final   Special Requests IN PEDIATRIC BOTTLE Blood Culture adequate volume  Final   Culture NO GROWTH 2 DAYS  Final   Report Status PENDING  Incomplete         Radiology Studies: Dg Chest Port 1 View  Result Date: 07/05/2017 CLINICAL DATA:  Respiratory failure EXAM: PORTABLE CHEST 1 VIEW COMPARISON:  07/04/2017 FINDINGS: Left lower lobe consolidation unchanged. Mild right lower lobe atelectasis/ infiltrate unchanged. Progression of mild patchy right upper lobe infiltrate since prior study. Negative for heart failure. IMPRESSION: Bibasilar airspace disease unchanged. Progression of mild right upper lobe airspace disease. Electronically Signed   By: Franchot Gallo M.D.    On: 07/05/2017 07:03        Scheduled Meds: . chlorhexidine  15 mL Mouth Rinse BID  . Chlorhexidine Gluconate Cloth  6  each Topical [id number]  . collagenase   Topical Daily  . dorzolamide  1 drop Left Eye BID  . FLUoxetine  20 mg Oral Daily  . latanoprost  1 drop Left Eye QHS  . levothyroxine  50 mcg Intravenous Daily  . mouth rinse  15 mL Mouth Rinse q12n4p  . methylPREDNISolone (SOLU-MEDROL) injection  20 mg Intravenous Q12H  . mirtazapine  7.5 mg Oral QHS  . mupirocin cream   Topical Daily  . mupirocin ointment  1 application Nasal BID  . pantoprazole (PROTONIX) IV  40 mg Intravenous Q24H  . pilocarpine  1 drop Left Eye BID  . rOPINIRole  1 mg Oral QHS  . timolol  1 drop Left Eye BID   Continuous Infusions: . meropenem (MERREM) IV Stopped (07/06/17 1115)  . vancomycin Stopped (07/06/17 0041)     LOS: 3 days     Cordelia Poche, [name] [facility] 07/06/2017, 12:36 PM Pager: [phone]  If 7PM-7AM, please contact night-coverage [email or web address] Password [facility] 07/06/2017, 12:36 PM

## 2017-07-06 NOTE — Care Management Note (Signed)
Case Management Note  Patient Details  Name: [name] MRN: [id number] Date of Birth: 1927-06-17  Subjective/Objective:                    Action/Plan:  Prior to admission patient was active with [facility] RN,PT and aide. Will need new orders to resume.  Expected Discharge Date:                  Expected Discharge Plan:  [place]  In-House Referral:     Discharge planning Services  CM Consult  Post Acute Care Choice:    Choice offered to:     DME Arranged:    DME Agency:     HH Arranged:    [place] Agency:     Status of Service:  In process, will continue to follow  If discussed at Long Length of Stay Meetings, dates discussed:    Additional Comments:  Marilu Favre, RN 07/06/2017, 11:25 AM

## 2017-07-06 NOTE — Progress Notes (Signed)
Pharmacy Antibiotic Note  [name] is a 83 y.o. female admitted on 07/03/2017 with pneumonia, recent ESBL UTI + bacteremia.  Pharmacy has been consulted for meropenem dosing.  Day #4 of abx for proteus UTI, possible PNA, and possible infected sacral decub. Does have hx of ESBL. Resp panel showed RSV. Afebrile, WBC 10.7.   Plan: Continue Meropenem 1g IV q12h Stop vancomycin Monitor clinical picture, renal function, VT prn F/U C&S, abx deescalation / LOT   Height: 5\' 7"  (170.2 cm) Weight: 123 lb 0.3 oz (55.8 kg) IBW/kg (Calculated) : 61.6  Temp (24hrs), Avg:98.4 F (36.9 C), Min:98.4 F (36.9 C), Max:98.4 F (36.9 C)  Recent Labs  Lab 07/03/17 1533 07/03/17 1544 07/03/17 1545 07/03/17 1941 07/04/17 0014 07/05/17 0334 07/06/17 0604  WBC 7.7  --   --   --  6.2 10.3 10.7*  CREATININE 1.21* 1.30*  --   --  1.10* 1.10* 0.77  LATICACIDVEN  --   --  1.10 1.01  --   --   --     Estimated Creatinine Clearance: 42.8 mL/min (by C-G formula based on SCr of 0.77 mg/dL).    No Known Allergies   Elenor Quinones, PharmD, BCPS Clinical Pharmacist Pager [phone] 07/06/2017 1:26 PM

## 2017-07-06 NOTE — Progress Notes (Signed)
ANTICOAGULATION CONSULT NOTE - Initial Consult  Pharmacy Consult for warfarin Indication: atrial fibrillation  No Known Allergies  Patient Measurements: Height: 5\' 7"  (170.2 cm) Weight: 123 lb 0.3 oz (55.8 kg) IBW/kg (Calculated) : 61.6  Assessment: 89 yof on Coumadin 1.5mg  daily exc for 3mg  on Tues/Thurs PTA for Afib. Admit INR 9.48 >> vit K 5mg  IV given 11/17. Now holding Coumadin due to low Hgb but plan to restart on 11/20. INR down to 1.18 today. Hgb 8.4, plts wnl.  Goal of Therapy:  INR 2-3 Monitor platelets by anticoagulation protocol: Yes   Plan:  Give Coumadin 3mg  PO x 1 Monitor daily INR, CBC, s/s of bleed  Elenor Quinones, PharmD, BCPS Clinical Pharmacist Pager [phone] 07/06/2017 1:24 PM

## 2017-07-06 NOTE — Progress Notes (Signed)
  Speech Language Pathology Treatment: Dysphagia  Patient Details Name: [name] MRN: [id number] DOB: January 17, 1927 Today's Date: 07/06/2017 Time: 6546-5035 SLP Time Calculation (min) (ACUTE ONLY): 13 min  Assessment / Plan / Recommendation Clinical Impression  Daughter and granddaughter present today (g'dtr is primary caregiver).  Pt consuming limited POs - family describes poor intake as ongoing issue.  Pt with no overt difficulty today- observed with consumption of liquids, requiring min cues for positioning. Granddaughter understands esophageal precautions and that swallowing condition remains similar to findings from last assessment in Aug of this year. Lungs diminished but clear; afebrile.  No further SLP needs identified - our services will sign off.      HPI HPI: 83 year old female with PMH of Afib on coumadin, combined HF, COPD,former smoker,chronic respiratory failure on 3-4L [place],dysphagia, GERD,glaucoma w/anisocoria, hypothyroidsim, peripheral neuropathy, and chronic pressure ulcers who presented with acute lethargy on 11/17. Dx acute-on-chronic resp failure with hypoxia, hypercapnia. Cared for at home by dtr, Madaline Savage and [facility] aid.  Progressive decline last several months.  Documented dysphagia, primarily esophageal in nature, with reflux to level of pharynx and distal stricture per Feb 2018 esophagram.  Pt known to SLP services from prior admissions; last evaluation was bedside evaluation Aug 2018.  Family education focused on compensating as able for esophageal deficits.  Pt was D/Cd on dysphagia 2 diet with thin liquids.       SLP Plan  All goals met       Recommendations  Diet recommendations: Dysphagia 2 (fine chop);Thin liquid Liquids provided via: Cup;Straw Medication Administration: Crushed with puree Supervision: Staff to assist with self feeding Compensations: Minimize environmental distractions;Follow solids with liquid Postural Changes and/or Swallow Maneuvers:  Seated upright 90 degrees;Upright 30-60 min after meal                Oral Care Recommendations: Oral care BID Follow up Recommendations: None SLP Visit Diagnosis: Dysphagia, pharyngoesophageal phase (R13.14) Plan: All goals met       GO                [name] 07/06/2017, 1:03 PM  Corinthia Helmers L. Tivis Ringer, [place] CCC/SLP Pager [phone]

## 2017-07-07 ENCOUNTER — Encounter (HOSPITAL_COMMUNITY): Payer: Self-pay | Admitting: Family Medicine

## 2017-07-07 DIAGNOSIS — I700 Atherosclerosis of aorta: Secondary | ICD-10-CM

## 2017-07-07 DIAGNOSIS — I5043 Acute on chronic combined systolic (congestive) and diastolic (congestive) heart failure: Secondary | ICD-10-CM

## 2017-07-07 DIAGNOSIS — J441 Chronic obstructive pulmonary disease with (acute) exacerbation: Secondary | ICD-10-CM

## 2017-07-07 DIAGNOSIS — E039 Hypothyroidism, unspecified: Secondary | ICD-10-CM

## 2017-07-07 DIAGNOSIS — I4891 Unspecified atrial fibrillation: Secondary | ICD-10-CM

## 2017-07-07 DIAGNOSIS — D649 Anemia, unspecified: Secondary | ICD-10-CM

## 2017-07-07 DIAGNOSIS — G9341 Metabolic encephalopathy: Secondary | ICD-10-CM

## 2017-07-07 DIAGNOSIS — I5022 Chronic systolic (congestive) heart failure: Secondary | ICD-10-CM

## 2017-07-07 DIAGNOSIS — J9611 Chronic respiratory failure with hypoxia: Secondary | ICD-10-CM

## 2017-07-07 HISTORY — DX: Atherosclerosis of aorta: I70.0

## 2017-07-07 LAB — PROTIME-INR
INR: 1.77
Prothrombin Time: 20.5 seconds — ABNORMAL HIGH (ref 11.4–15.2)

## 2017-07-07 MED ORDER — PANTOPRAZOLE SODIUM 40 MG PO TBEC
40.0000 mg | DELAYED_RELEASE_TABLET | Freq: Every day | ORAL | Status: DC
  Administered 2017-07-08 – 2017-07-09 (×2): 40 mg via ORAL
  Filled 2017-07-07 (×2): qty 1

## 2017-07-07 MED ORDER — LEVOTHYROXINE SODIUM 100 MCG PO TABS
100.0000 ug | ORAL_TABLET | Freq: Every day | ORAL | Status: DC
  Administered 2017-07-08 – 2017-07-09 (×2): 100 ug via ORAL
  Filled 2017-07-07 (×2): qty 1

## 2017-07-07 MED ORDER — ALBUTEROL SULFATE (2.5 MG/3ML) 0.083% IN NEBU: 2.5000 mg | INHALATION_SOLUTION | Freq: Four times a day (QID) | RESPIRATORY_TRACT | Status: DC | PRN

## 2017-07-07 MED ORDER — METOPROLOL TARTRATE 12.5 MG HALF TABLET: 12.5000 mg | ORAL_TABLET | Freq: Two times a day (BID) | ORAL | Status: DC

## 2017-07-07 NOTE — Discharge Instructions (Signed)

## 2017-07-07 NOTE — Progress Notes (Signed)
PT Cancellation Note  Patient Details Name: [name] MRN: [id number] DOB: 1926/10/30   Cancelled Treatment:    Reason Eval/Treat Not Completed: Other (comment);Patient declined, no reason specified.  Per chart pt is bedbound at baseline which she confirms.  Pt declines to attempt any sitting and ck of O2 sats is 97%.  Will reattempt at a later time.   Ramond Dial 07/07/2017, 3:11 PM   Mee Hives, PT MS Acute Rehab Dept. Number: [place] and [place]

## 2017-07-07 NOTE — Progress Notes (Signed)
PROGRESS NOTE  [name] EUM:[id number] DOB: 05/21/1927 DOA: 07/03/2017 PCP: Tamsen Roers, MD  Brief Narrative: 36yow PMH afib, COPD on home oxygen 3-4L, presented with acute lethargy. Admitted by PCCM for acute on chronic hypoxic respiratory failure, hypercapnic resp failure. Treated with BiPAP, found to have RSV pneumonitis/pneumonia. Transfered to medical floor and TRH assumed care 11/20.  Assessment/Plan Acute on chronic hypoxic, hypercapnic resp failure with acute resp acidosis secondary to RSV pneumonia/pneumonitis and COPD exacerbation treated with BiPAP, steroids, bronchodilators - appears stable - wean steroids tomorrow, continue oxygen, bronchodilators  Acute encephalopathy secondary to above, complicated by gabapentin. - appears resolved  Atrial fibrillation on warfarin with coagulopathy on admission treated with vitamin K. - INR trending back up. Warfarin per pharmacy.  Acute on chronic systolic/diastolic CHF - appears compensated.  UTI Proteus mirabilis - treated  Normocytic anemia - chronic, stable, f/u as an outpatient  COPD with chronic hypoxic respiratory failure on 3-4 L [place] - appears stable  Hypothyroidism. TSH 15.374 - levothyroxine increased, repeat TSH in 4 weeks  Aortic atherosclerosis   Bedbound  Chronic sacral, heel pressure ulcers present on admission - wound care per wound care RN  SLP Diet Recommendations: Dysphagia 2 (Fine chop) solids;Thin liquid   Improving. D/c telemetry, stop abx after last dose this evening.  Likely release 11/22  DVT prophylaxis: warfarin Code Status: partial--no CPR/defib, but intubation, meds ok. Family Communication: none Disposition Plan: return to facility    Murray Hodgkins, MD  [facility] Direct contact: [phone] --[place]ia [place]  --[email or web address]; password [email or web address]  7PM-7AM contact night coverage as above 07/07/2017, 6:13 PM  LOS: 4 days   Consultants:  PCCM  admitted  Procedures:  BiPAP  Antimicrobials: Vancomycin 11/17 >>> 11/20 Merrem 11/17 >>>  Interval history/Subjective: Feels ok, no complaints  Objective: Vitals:  Vitals:   07/07/17 0522 07/07/17 1502  BP: 132/77 (!) 110/45  Pulse: 98 78  Resp: 20 16  Temp: 97.8 F (36.6 C) 98 F (36.7 C)  SpO2: 100% 99%    Exam:  Constitutional:  . Appears calm and comfortable lying in bed Eyes:  Marland Kitchen Left pupil 3 mm, round, right pupil 5 mm ENMT:  . Hard of hearing Respiratory:  . CTA bilaterally, no w/r/r.  . Respiratory effort normal.  Cardiovascular:  . RRR, no m/r/g . No LE extremity edema   Psychiatric:  . Mental status o Calm, appears confused   I have personally reviewed the following:   Labs:  None today  Scheduled Meds: . chlorhexidine  15 mL Mouth Rinse BID  . Chlorhexidine Gluconate Cloth  6 each Topical Q0600  . collagenase   Topical Daily  . dorzolamide  1 drop Left Eye BID  . FLUoxetine  20 mg Oral Daily  . latanoprost  1 drop Left Eye QHS  . [START ON 07/08/2017] levothyroxine  100 mcg Oral QAC breakfast  . mouth rinse  15 mL Mouth Rinse q12n4p  . methylPREDNISolone (SOLU-MEDROL) injection  20 mg Intravenous Q12H  . mirtazapine  7.5 mg Oral QHS  . mupirocin cream   Topical Daily  . mupirocin ointment  1 application Nasal BID  . [START ON 07/08/2017] pantoprazole  40 mg Oral Daily  . pilocarpine  1 drop Left Eye BID  . rOPINIRole  1 mg Oral QHS  . timolol  1 drop Left Eye BID  . Warfarin - Pharmacist Dosing Inpatient   Does not apply q1800   Continuous Infusions: . meropenem (MERREM) IV Stopped (07/07/17 1120)  Principal Problem:   Acute respiratory failure with hypoxia and hypercapnia (HCC) Active Problems:   Atrial fibrillation (HCC)   Hypothyroidism   Normocytic anemia   COPD with acute exacerbation (HCC)   Cardiomyopathy (HCC)   UTI (urinary tract infection)   Acute metabolic encephalopathy   Pressure injury of skin   Acute on  chronic combined systolic and diastolic CHF (congestive heart failure) (HCC)   Chronic respiratory failure with hypoxia (HCC)   Aortic atherosclerosis ([place])   LOS: 4 days

## 2017-07-07 NOTE — Progress Notes (Signed)
ANTICOAGULATION CONSULT NOTE - Initial Consult  Pharmacy Consult for warfarin Indication: atrial fibrillation  No Known Allergies  Patient Measurements: Height: 5\' 7"  (170.2 cm) Weight: 123 lb 0.3 oz (55.8 kg) IBW/kg (Calculated) : 61.6  Assessment: 89 yof on Coumadin 1.5mg  daily exc for 3mg  on Tues/Thurs PTA for Afib. Admit INR 9.48 >> vit K 5mg  IV given 11/17. Now holding Coumadin due to low Hgb but plan to restart on 11/20. INR up to 1.77 today. Hgb 8.4, plts wnl.  Goal of Therapy:  INR 2-3 Monitor platelets by anticoagulation protocol: Yes   Plan:  Hold Coumadin tonight due to jump in INR Monitor daily INR, CBC, s/s of bleed  Elenor Quinones, PharmD, [facility] Clinical Pharmacist Pager [phone] 07/07/2017 9:59 AM

## 2017-07-08 LAB — CBC
HCT: 25.4 % — ABNORMAL LOW (ref 36.0–46.0)
Hemoglobin: 8.4 g/dL — ABNORMAL LOW (ref 12.0–15.0)
MCH: 31.1 pg (ref 26.0–34.0)
MCHC: 33.1 g/dL (ref 30.0–36.0)
MCV: 94.1 fL (ref 78.0–100.0)
PLATELETS: 281 10*3/uL (ref 150–400)
RBC: 2.7 MIL/uL — ABNORMAL LOW (ref 3.87–5.11)
RDW: 16.1 % — AB (ref 11.5–15.5)
WBC: 10.6 10*3/uL — AB (ref 4.0–10.5)

## 2017-07-08 LAB — PROTIME-INR
INR: 2.18
Prothrombin Time: 24.1 seconds — ABNORMAL HIGH (ref 11.4–15.2)

## 2017-07-08 LAB — CULTURE, BLOOD (ROUTINE X 2)
CULTURE: NO GROWTH
SPECIAL REQUESTS: ADEQUATE

## 2017-07-08 MED ORDER — WARFARIN SODIUM 1 MG PO TABS
1.5000 mg | ORAL_TABLET | Freq: Once | ORAL | Status: AC
  Administered 2017-07-08: 1.5 mg via ORAL
  Filled 2017-07-08: qty 1

## 2017-07-08 MED ORDER — PREDNISONE 20 MG PO TABS
40.0000 mg | ORAL_TABLET | Freq: Every day | ORAL | Status: DC
  Administered 2017-07-09: 40 mg via ORAL
  Filled 2017-07-08: qty 2

## 2017-07-08 MED ORDER — ACETAMINOPHEN 325 MG PO TABS
650.0000 mg | ORAL_TABLET | Freq: Four times a day (QID) | ORAL | Status: DC | PRN
  Administered 2017-07-08: 650 mg via ORAL
  Filled 2017-07-08: qty 2

## 2017-07-08 MED ORDER — OXYCODONE-ACETAMINOPHEN 5-325 MG PO TABS
1.0000 | ORAL_TABLET | Freq: Two times a day (BID) | ORAL | Status: DC
  Administered 2017-07-08 – 2017-07-09 (×2): 1 via ORAL
  Filled 2017-07-08 (×2): qty 1

## 2017-07-08 NOTE — Progress Notes (Signed)
ANTICOAGULATION CONSULT NOTE - Initial Consult  Pharmacy Consult for warfarin Indication: atrial fibrillation  No Known Allergies  Patient Measurements: Height: 5\' 7"  (170.2 cm) Weight: 123 lb 3.8 oz (55.9 kg) IBW/kg (Calculated) : 61.6  Assessment: [name] on Coumadin 1.5mg  daily exc for 3mg  on Tues/Thurs PTA for Afib. Admit INR 9.48 >> vit K 5mg  IV given 11/17. Now holding Coumadin due to low Hgb but plan to restart on 11/20. INR up to 2.18 today. Hgb 8.4, plts wnl.  Goal of Therapy:  INR 2-3 Monitor platelets by anticoagulation protocol: Yes   Plan:  Give Coumadin 1.5 mg PO x 1 tonight Monitor daily INR, CBC, s/s of bleed  May need to send home on lower dose (ie 1.5 mg PO daily) and have close follow up as outpatient  Elenor Quinones, PharmD, [facility] Clinical Pharmacist Pager [phone] 07/08/2017 7:46 AM

## 2017-07-08 NOTE — Progress Notes (Signed)
PROGRESS NOTE  [name] HBZ:[id number] DOB: 08/28/1926 DOA: 07/03/2017 PCP: Tamsen Roers, MD  Brief Narrative: 13yow PMH afib, COPD on home oxygen 3-4L, presented with acute lethargy. Admitted by PCCM for acute on chronic hypoxic respiratory failure, hypercapnic resp failure. Treated with BiPAP, found to have RSV pneumonitis/pneumonia. Transfered to medical floor and TRH assumed care 11/20.  Assessment/Plan Acute on chronic hypoxic, hypercapnic resp failure with acute resp acidosis secondary to RSV pneumonia/pneumonitis and COPD exacerbation treated with BiPAP, steroids, bronchodilators - continues to improve - change to oral steroids; continue oxygen, bronchodilators  Acute encephalopathy secondary to above, complicated by gabapentin. - resolved  Atrial fibrillation on warfarin with coagulopathy on admission treated with vitamin K. - INR therapeutic today. Continue warfarin per pharmacy.  Acute on chronic systolic/diastolic CHF - appears compensated  UTI Proteus mirabilis - treated  Normocytic anemia - chronic, stable, f/u as an outpatient  COPD with chronic hypoxic respiratory failure on 3-4 L [place]  Hypothyroidism. TSH 15.374 - levothyroxine increased, repeat TSH in 4 weeks  Aortic atherosclerosis   Bedbound  Chronic sacral, heel pressure ulcers present on admission - wound care per wound care RN  SLP Diet Recommendations: Dysphagia 2 (Fine chop) solids;Thin liquid   Much improved. Stop abx. Anticipate home 11/23  DVT prophylaxis: warfarin Code Status: partial--no CPR/defib, but intubation, meds ok. Family Communication: son at bedside Disposition Plan: home with family   Murray Hodgkins, MD  [facility] Direct contact: [phone] --[address]ia [place]  --[email or web address]; password [email or web address]  7PM-7AM contact night coverage as above 07/08/2017, 3:08 PM  LOS: 5 days   Consultants:  PCCM admitted  Procedures:  BiPAP  Antimicrobials: Vancomycin  11/17 >>> 11/20 Merrem 11/17 >>> 11/22  Interval history/Subjective: Feels ok. Wants to go home.  Objective: Vitals:  Vitals:   07/07/17 2039 07/08/17 0616  BP: 122/66 126/88  Pulse: 77 88  Resp: 16 16  Temp: 98.9 F (37.2 C) 97.6 F (36.4 C)  SpO2: 98% 99%    Exam:  Constitutional:   . Appears calm and comfortable, sitting up in bed, very alert Respiratory:  . Few crackles on left, but generally clear.   Marland Kitchen Respiratory effort normal.  Cardiovascular:  . RRR, no m/r/g . No LE extremity edema   Psychiatric:  . Mental status o Mood, affect appropriate Appears confused     I have personally reviewed the following:   Labs:  Hgb stable, 8.4  INR 2.18  Scheduled Meds: . chlorhexidine  15 mL Mouth Rinse BID  . collagenase   Topical Daily  . dorzolamide  1 drop Left Eye BID  . FLUoxetine  20 mg Oral Daily  . latanoprost  1 drop Left Eye QHS  . levothyroxine  100 mcg Oral QAC breakfast  . mouth rinse  15 mL Mouth Rinse q12n4p  . methylPREDNISolone (SOLU-MEDROL) injection  20 mg Intravenous Q12H  . mirtazapine  7.5 mg Oral QHS  . mupirocin cream   Topical Daily  . mupirocin ointment  1 application Nasal BID  . pantoprazole  40 mg Oral Daily  . pilocarpine  1 drop Left Eye BID  . rOPINIRole  1 mg Oral QHS  . timolol  1 drop Left Eye BID  . warfarin  1.5 mg Oral ONCE-1800  . Warfarin - Pharmacist Dosing Inpatient   Does not apply q1800   Continuous Infusions:   Principal Problem:   Acute respiratory failure with hypoxia and hypercapnia (HCC) Active Problems:   Atrial fibrillation (HCC)  Hypothyroidism   Normocytic anemia   COPD with acute exacerbation (HCC)   Cardiomyopathy (HCC)   UTI (urinary tract infection)   Acute metabolic encephalopathy   Pressure injury of skin   Acute on chronic combined systolic and diastolic CHF (congestive heart failure) (HCC)   Chronic respiratory failure with hypoxia (HCC)   Aortic atherosclerosis ([place])   LOS: 5 days

## 2017-07-09 LAB — CULTURE, BLOOD (ROUTINE X 2)
Culture: NO GROWTH
SPECIAL REQUESTS: ADEQUATE

## 2017-07-09 LAB — PROTIME-INR
INR: 2.86
Prothrombin Time: 29.8 seconds — ABNORMAL HIGH (ref 11.4–15.2)

## 2017-07-09 MED ORDER — COLLAGENASE 250 UNIT/GM EX OINT: TOPICAL_OINTMENT | Freq: Every day | CUTANEOUS | 0 refills | Status: AC

## 2017-07-09 MED ORDER — WARFARIN SODIUM 3 MG PO TABS: 1.5000 mg | ORAL_TABLET | Freq: Every day | ORAL | Status: AC

## 2017-07-09 MED ORDER — PREDNISONE 10 MG PO TABS: ORAL_TABLET | ORAL | 0 refills | Status: DC

## 2017-07-09 NOTE — Evaluation (Addendum)
Physical Therapy Evaluation Patient Details Name: [name] MRN: [id number] DOB: 07/18/1927 Today's Date: 07/09/2017   History of Present Illness  36yow PMH afib, COPD on home oxygen 3-4L, presented with acute lethargy. Admitted by PCCM for acute on chronic hypoxic respiratory failure, hypercapnic resp failure. Treated with BiPAP, found to have RSV pneumonitis/pneumonia.     Clinical Impression  Pt admitted with above diagnosis. Pt currently with functional limitations due to the deficits listed below (see PT Problem List). PTA, patient was limited to bed with max-total assist for all mobility. Pt lives with family who provide 24 hour care and who wish to have patient return home. Pts family report a decline in function from baseline over last several days. Currently patient presenting lethargic and is now max assist x2 for bed mobility. Pt able to tolerate sitting EOB with min guard during session and follow commands intermittently. Per granddaughter they feel well prepared and educated on 24 hour care taking. Next session to focus on increasing participation with bed mobility and light therex. Pt will benefit from skilled PT to increase their independence and safety with mobility to allow discharge to the venue listed below.       Follow Up Recommendations Home health PT;Supervision/Assistance - 24 hour    Equipment Recommendations  None recommended by PT    Recommendations for Other Services       Precautions / Restrictions Precautions Precautions: Fall Restrictions Weight Bearing Restrictions: No      Mobility  Bed Mobility Overal bed mobility: Needs Assistance Bed Mobility: Supine to Sit     Supine to sit: Total assist;+2 for physical assistance;HOB elevated     General bed mobility comments: +2 for trunk support and LLE advacement. Utilized bed pad to scoot patietent to EOB. Strong posterior lean needing max to total assist to correct. Once EOB, patient able to  maintain sitting balance with min guard with kyphotic posture.    Transfers                 General transfer comment: total A for transfers at home  Ambulation/Gait                Stairs            Wheelchair Mobility    Modified Rankin (Stroke Patients Only)       Balance Overall balance assessment: Needs assistance Sitting-balance support: Bilateral upper extremity supported;Feet supported Sitting balance-Leahy Scale: Poor Sitting balance - Comments: zero initially but progressed to fair sitting balance. Posterior lean initially.                                      Pertinent Vitals/Pain Pain Assessment: Faces Faces Pain Scale: Hurts little more Pain Location: Chest, grimacing with bed mobility.  Pain Descriptors / Indicators: Grimacing Pain Intervention(s): Limited activity within patient's tolerance;Monitored during session;Repositioned    Home Living Family/patient expects to be discharged to:: Private residence Living Arrangements: Children;Other (Comment) Available Help at Discharge: Family;Available 24 hours/day Type of Home: House Home Access: Ramped entrance     Home Layout: One level Home Equipment: Youth worker - 2 wheels;Shower seat - built in;Bedside commode;Hospital bed(hospital bed) Additional Comments: 07/09/17 spoke with daughter via phone then grandaughter in room. Multiple family members taking turns to provide 24 hour care at baseline. Daughter reports can have +2 if needed at home. Previous OT note 04/04/07: Pts son is  one of her primary caregivers; he was recently hospitalized for back sx, and the daughter has injury from helping to lift and care for her mother as well. She also has a lift chair     Prior Function Level of Independence: Needs assistance   Gait / Transfers Assistance Needed: Transfers only with assistance, does scoot transfer or sliding board. Family makes sure she is out of bed daily, has  to transfer from hosptial bed to [facility] then to the lift chair/recliner   ADL's / Homemaking Assistance Needed: pt does self feeding and grooming and helps some with UB adls. Sit for short periods of time EOB (<30 min) "we don't leave her like that long."         Hand Dominance        Extremity/Trunk Assessment   Upper Extremity Assessment Upper Extremity Assessment: Generalized weakness;Difficult to assess due to impaired cognition    Lower Extremity Assessment Lower Extremity Assessment: (Gross BLE strength 2+/5)    Cervical / Trunk Assessment Cervical / Trunk Assessment: Kyphotic  Communication   Communication: HOH  Cognition Arousal/Alertness: Awake/alert;Lethargic Behavior During Therapy: Flat affect Overall Cognitive Status: History of cognitive impairments - at baseline                                 General Comments: family memeber in room reports pt is more lethargic today than her baseline.       General Comments General comments (skin integrity, edema, etc.): VSS througohut session. Pt limited by lethargy. Cognitive impariements at baseline but able to follow commands 25-50% of the time. Discussion with granddaugther in room about home set up and care, good level of support at home. Skin breakdown noted with multiple locations family previously educated on skin protection.     Exercises General Exercises - Lower Extremity Ankle Circles/Pumps: AROM;Both;5 reps Long Arc Quad: AROM;Both;5 reps   Assessment/Plan    PT Assessment Patient needs continued PT services  PT Problem List Decreased strength;Decreased range of motion;Decreased activity tolerance;Decreased balance;Decreased cognition;Decreased knowledge of use of DME;Pain;Decreased skin integrity;Decreased mobility       PT Treatment Interventions Functional mobility training;Therapeutic activities;Therapeutic exercise    PT Goals (Current goals can be found in the Care Plan section)  Acute  Rehab PT Goals Patient Stated Goal: family goal: Pt back to baseline function. PT Goal Formulation: With family Time For Goal Achievement: 07/23/17 Potential to Achieve Goals: Fair    Frequency Min 3X/week   Barriers to discharge        Co-evaluation PT/OT/SLP Co-Evaluation/Treatment: Yes Reason for Co-Treatment: For patient/therapist safety;To address functional/ADL transfers PT goals addressed during session: Mobility/safety with mobility;Other (comment);Strengthening/ROM(Family education) OT goals addressed during session: ADL's and self-care       AM-PAC PT "6 Clicks" Daily Activity  Outcome Measure Difficulty turning over in bed (including adjusting bedclothes, sheets and blankets)?: Unable Difficulty moving from lying on back to sitting on the side of the bed? : Unable Difficulty sitting down on and standing up from a chair with arms (e.g., wheelchair, bedside commode, etc,.)?: Unable Help needed moving to and from a bed to chair (including a wheelchair)?: Total Help needed walking in hospital room?: Total Help needed climbing 3-5 steps with a railing? : Total 6 Click Score: 6    End of Session Equipment Utilized During Treatment: Oxygen Activity Tolerance: Patient limited by lethargy;Patient limited by fatigue Patient left: in bed;with bed alarm set;with  call bell/phone within reach;with family/visitor present Nurse Communication: Mobility status PT Visit Diagnosis: Muscle weakness (generalized) (M62.81);Pain;Difficulty in walking, not elsewhere classified (R26.2);Other abnormalities of gait and mobility (R26.89) Pain - part of body: (general, trunk, chest)    Time: 1025-1100 PT Time Calculation (min) (ACUTE ONLY): 35 min   Charges:   PT Evaluation $PT Eval Moderate Complexity: 1 Mod     PT G Codes:        Reinaldo Berber, PT, DPT Acute Rehab Services Pager: [phone]    Reinaldo Berber 07/09/2017, 1:21 PM

## 2017-07-09 NOTE — Discharge Summary (Signed)
Physician Discharge Summary  [name] RXV:[id number] DOB: 1926/10/29 DOA: 07/03/2017  PCP: Tamsen Roers, MD PCP office closed until 11/24  Admit date: 07/03/2017 Discharge date: 07/09/2017  Recommendations for Outpatient Follow-up:  1. Normocytic anemia, chronic, stable, consider f/u as an outpatient 2. Hypothyroidism. TSH 15.374. Levothyroxine increased, repeat TSH in 4 weeks ~ 08/02/2017 3. Aortic atherosclerosis  4. Chronic sacral, heel pressure ulcers present on admission. [place] RN. Prevalon boots to reduce pressure.  Santyl ointment to provide enzymatic debridement of nonviable tissue to right heel and right outer ankle.  Bactroban to promote moist healing to bilat toes. Foam dressing to left ankle and heel to protect from further injury. 5. SLP Diet Recommendations: Dysphagia 2 (Fine chop) solids;Thin liquid 6. Atrial fibrillation on warfarin. INR therapeutic on discharge. Pharmacy recommended holding warfarin 11/23 and resuming at lower dose 1.5 mg daily 11/24 until INR check next week. HHRN please check INR and discuss with outpatient physician.   Discharge Diagnoses:  1. Acute on chronic hypoxic, hypercapnic resp failure 2. Acute resp acidosis  3. RSV pneumonia/pneumonitis  4. COPD exacerbation 5. Acute encephalopathy  6. Atrial fibrillation on warfarin 7. Acute on chronic systolic/diastolic CHF 8. UTI Proteus mirabilis 9. Normocytic anemia 10. COPD with chronic hypoxic respiratory failure on 3-4 L [place] 11. Hypothyroidism 12. Aortic atherosclerosis  13. Bedbound 14. Chronic sacral, heel pressure ulcers present on admission 15. Dysphagia, pharyngoesophageal phase   Discharge Condition: improved Disposition: return home with family  Diet recommendation: Dysphagia 2 (Fine chop) solids;Thin liquid  Filed Weights   07/06/17 1013 07/07/17 1019 07/08/17 2148  Weight: 55.8 kg (123 lb 0.3 oz) 55.9 kg (123 lb 3.8 oz) 57.5 kg (126 lb 11.2 oz)    History of present  illness:  83yow PMH afib, COPD on home oxygen 3-4L, presented with acute lethargy.   Hospital Course:  Admitted by PCCM for acute on chronic hypoxic respiratory failure, hypercapnic resp failure. Treated with BiPAP, found to have RSV pneumonitis/pneumonia. Gradually improved and was transfered to medical floor and TRH assumed care 11/20. Acute resp issues resolved with supportive care and oxygen requirement returned to baseline 3LNC as at home. Plan at discharge is to return home with family. Individual issues as below.  Acute on chronic hypoxic, hypercapnic resp failure with acute resp acidosis secondary to RSV pneumonia/pneumonitis and COPD exacerbation treated with BiPAP, steroids, bronchodilators - acute issues appear resolved. Complete steroid taper as outpatient.  Acute encephalopathy secondary to above, possibly complicated by gabapentin. - resolved. Resume gabapentin at discharge  Atrial fibrillation on warfarin with coagulopathy on admission treated with vitamin K. -  INR therapeutic. Pharmacy recommended holding warfarin 11/23 and resuming at lower dose 1.5 mg daily until INR check next week.  Acute on chronic systolic/diastolic CHF - treated with Lasix. Appears compensated  UTI Proteus mirabilis - treated  Normocytic anemia - chronic, stable, f/u as an outpatient  COPD with chronic hypoxic respiratory failure on 3-4 L [place]  Hypothyroidism. TSH 15.374 - levothyroxine increased, repeat TSH in 4 weeks  Aortic atherosclerosis   Bedbound  Chronic sacral, heel pressure ulcers present on admission - wound care per wound care RN  SLP Diet Recommendations: Dysphagia 2 (Fine chop) solids;Thin liquid  Consultants:  PCCM admitted  Procedures:  BiPAP  Antimicrobials: Vancomycin 11/17 >>> 11/20 Merrem 11/17 >>> 11/22  Today's assessment: S: feels fine O: Vitals:  Vitals:   07/08/17 2148 07/09/17 0534  BP: (!) 108/54 123/83  Pulse: 79 84  Resp: 16 16  Temp: 97.6 F (36.4 C) 97.9 F (36.6 C)  SpO2: 100% 99%    Constitutional:  . Appears calm and comfortable Respiratory:  . Fairly clear with few adventitial sounds bilaterally. No wheezes. Fair air movement  . Respiratory effort normal.  Cardiovascular:  . RRR, no m/r/g . No LE extremity edema   Skin:  . Toes pink Psychiatric:  . Mental status o confused    Discharge Instructions  Discharge Instructions    Discharge instructions   Complete by:  As directed    Call your physician or seek immediate medical attention for fever, shortness of breath, increased pain or worsening of condition. The speech therapist has recommended finely chopping all food.     Allergies as of 07/09/2017   No Known Allergies     Medication List    STOP taking these medications   Cholecalciferol 50000 units Tabs   loratadine-pseudoephedrine 10-240 MG 24 hr tablet Commonly known as:  CLARITIN-D 24-hour   PROMETHAZINE-DM PO     TAKE these medications   albuterol (2.5 MG/3ML) 0.083% nebulizer solution Commonly known as:  PROVENTIL Take 2.5 mg every 6 (six) hours as needed by nebulization for wheezing or shortness of breath.   collagenase ointment Commonly known as:  SANTYL Apply topically daily. Apply Santyl to right outer ankle and right heel wounds Q day, then cover with foam dressing.  (Change foam dressing Q 3 days or PRN soiling.) Start taking on:  07/10/2017   dorzolamide 2 % ophthalmic solution Commonly known as:  TRUSOPT Place 1 drop 2 (two) times daily into the left eye.   ergocalciferol 50000 units capsule Commonly known as:  VITAMIN D2 Take 50,000 Units by mouth every Thursday.   ferrous sulfate 325 (65 FE) MG tablet Take 1 tablet (325 mg total) by mouth daily with breakfast.   FLUoxetine 20 MG tablet Commonly known as:  PROZAC Take 20 mg daily by mouth.   gabapentin 600 MG tablet Commonly known as:  NEURONTIN Take 600 mg at bedtime by mouth.   guaiFENesin 600  MG 12 hr tablet Commonly known as:  MUCINEX Take 600 mg 2 (two) times daily by mouth.   levothyroxine 88 MCG tablet Commonly known as:  SYNTHROID, LEVOTHROID Take 88 mcg daily before breakfast by mouth.   loperamide 2 MG capsule Commonly known as:  IMODIUM Take 1 capsule (2 mg total) by mouth daily as needed for diarrhea or loose stools.   loratadine 10 MG tablet Commonly known as:  CLARITIN Take 10 mg 2 (two) times daily by mouth.   LUTEIN 20 PO Take 20 mg daily by mouth.   methocarbamol 500 MG tablet Commonly known as:  ROBAXIN Take 500 mg 2 (two) times daily by mouth.   metoprolol tartrate 25 MG tablet Commonly known as:  LOPRESSOR Take 12.5 mg 2 (two) times daily by mouth.   mirtazapine 7.5 MG tablet Commonly known as:  REMERON Take 7.5 mg by mouth every evening.   omeprazole 40 MG capsule Commonly known as:  PRILOSEC Take 40 mg by mouth 2 (two) times daily.   oxyCODONE-acetaminophen 5-325 MG tablet Commonly known as:  PERCOCET/ROXICET Take 1 tablet 2 (two) times daily by mouth.   OXYGEN Inhale 3 L daily into the lungs. Continuous   pilocarpine 4 % ophthalmic solution Commonly known as:  PILOCAR Place 1 drop 2 (two) times daily into the left eye.   predniSONE 10 MG tablet Commonly known as:  DELTASONE Take daily by mouth: 40 mg x3  days, then 20 mg x3 days, then 10 mg x3 days, then stop.   rOPINIRole 1 MG tablet Commonly known as:  REQUIP Take 1 mg by mouth every evening.   timolol 0.5 % ophthalmic solution Commonly known as:  TIMOPTIC Place 1 drop into the left eye 2 (two) times daily.   travoprost (benzalkonium) 0.004 % ophthalmic solution Commonly known as:  TRAVATAN Place 1 drop into the left eye at bedtime.   vitamin C 1000 MG tablet Take 1,000 mg 2 (two) times daily by mouth.   warfarin 3 MG tablet Commonly known as:  COUMADIN Take 0.5 tablets (1.5 mg total) by mouth daily at 6 PM. What changed:    how much to take  when to take  this  additional instructions      No Known Allergies  The results of significant diagnostics from this hospitalization (including imaging, microbiology, ancillary and laboratory) are listed below for reference.    Significant Diagnostic Studies: Ct Head Wo Contrast  Result Date: 07/03/2017 CLINICAL DATA:  Altered level of consciousness. EXAM: CT HEAD WITHOUT CONTRAST TECHNIQUE: Contiguous axial images were obtained from the base of the skull through the vertex without intravenous contrast. COMPARISON:  04/01/2017 FINDINGS: Brain: There is atrophy and chronic small vessel disease changes. No acute intracranial abnormality. Specifically, no hemorrhage, hydrocephalus, mass lesion, acute infarction, or significant intracranial injury. Vascular: No hyperdense vessel or unexpected calcification. Skull: No acute calvarial abnormality. Sinuses/Orbits: No acute finding. Other: None IMPRESSION: No acute intracranial abnormality. Atrophy, chronic microvascular disease. Electronically Signed   By: Rolm Baptise M.D.   On: 07/03/2017 16:06   Dg Chest Port 1 View  Result Date: 07/05/2017 CLINICAL DATA:  Respiratory failure EXAM: PORTABLE CHEST 1 VIEW COMPARISON:  07/04/2017 FINDINGS: Left lower lobe consolidation unchanged. Mild right lower lobe atelectasis/ infiltrate unchanged. Progression of mild patchy right upper lobe infiltrate since prior study. Negative for heart failure. IMPRESSION: Bibasilar airspace disease unchanged. Progression of mild right upper lobe airspace disease. Electronically Signed   By: Franchot Gallo M.D.   On: 07/05/2017 07:03   Dg Chest Port 1 View  Result Date: 07/04/2017 CLINICAL DATA:  83 year old female with acute respiratory failure and shortness of breath. EXAM: PORTABLE CHEST 1 VIEW COMPARISON:  Chest radiograph dated 07/03/2017 FINDINGS: There is stable cardiomegaly. Small bilateral pleural effusions and associated bibasilar atelectasis for infiltrate. Overall the  aeration of the lungs is similar or slightly improved compared to the earlier radiograph. There is mild bronchiectatic changes. No pneumothorax. Atherosclerotic calcification of the aortic arch. No acute osseous pathology. IMPRESSION: Small bilateral pleural effusions and bibasilar atelectasis versus infiltrate. Overall slight interval improvement in aeration of the lungs compared to the earlier radiograph. Electronically Signed   By: Anner Crete M.D.   On: 07/04/2017 02:19   Dg Chest Portable 1 View  Result Date: 07/03/2017 CLINICAL DATA:  Altered mental status and shortness of breath. EXAM: PORTABLE CHEST 1 VIEW COMPARISON:  Chest x-ray dated 04/06/2017. FINDINGS: Stable cardiomegaly. Atherosclerotic changes noted at the aortic arch. Vague opacities at each lung base, likely small pleural effusions and/or atelectasis. IMPRESSION: 1. Stable cardiomegaly. 2. Probable small bilateral pleural effusions and/or atelectasis. 3. Aortic atherosclerosis. Electronically Signed   By: Franki Cabot M.D.   On: 07/03/2017 15:31   Dg Swallowing Func-speech Pathology  Result Date: 07/06/2017 Late entry from 07/05/17     Objective Swallowing Evaluation: Type of Study: MBS-Modified Barium Swallow Study  Patient Details Name: [name] MRN: [id number] Date of Birth:  November 24, 1926 Today's Date: 07/05/2017 Time: SLP Start Time (ACUTE ONLY): 1249 -SLP Stop Time (ACUTE ONLY): 1302 SLP Time Calculation (min) (ACUTE ONLY): 13 min Past Medical History: Past Medical History: Diagnosis Date . Atrial fibrillation (HCC)   chronic Coumadin.  . CHF (congestive heart failure) ([place])  . COPD (chronic obstructive pulmonary disease) ([place])  . Glaucoma  . Hypothyroidism  . Neuropathy   PERIPHERAL . Osteoarthritis  . Osteoporosis  . Trimalleolar fracture   LEFT TRIMALLEOLAR FRACTURE, DISLOCATION WITH OBLIQUE FRACTURE OF THE DISTAL FIBULAR DIAPHYSIS, AND NOTED BEING MILDLY COMMINUTED Past Surgical History: Past Surgical History: Procedure  Laterality Date . COLONOSCOPY W/ POLYPECTOMY  11/2010  Dr Fuller Plan.  multiple polyps:cecal, transverse, sigmoid.  largest 15mm, path: tubular adenomas without HGD.  mild sigmoid diverticulosis.  Marland Kitchen ORIF TIBIA & FIBULA FRACTURES  2016 . VAGINAL HYSTERECTOMY   HPI: 83 year old female with PMH of Afib on coumadin, combined HF, COPD,former smoker,chronic respiratory failure on 3-4L [place],dysphagia, GERD,glaucoma w/anisocoria, hypothyroidsim, peripheral neuropathy, and chronic pressure ulcers who presented with acute lethargy on 11/17. Dx acute-on-chronic resp failure with hypoxia, hypercapnia. Cared for at home by dtr, Madaline Savage and [facility] aid.  Progressive decline last several months.  Documented dysphagia, primarily esophageal in nature, with reflux to level of pharynx and distal stricture per Feb 2018 esophagram.  Pt known to SLP services from prior admissions; last evaluation was bedside evaluation Aug 2018.  Family education focused on compensating as able for esophageal deficits.  Pt was D/Cd on dysphagia 2 diet with thin liquids.  Subjective: alert, asking for water Assessment / Plan / Recommendation CHL IP CLINICAL IMPRESSIONS 07/06/2017 Clinical Impression -- SLP Visit Diagnosis Dysphagia, pharyngoesophageal phase (R13.14) Attention and concentration deficit following -- Frontal lobe and executive function deficit following -- Impact on safety and function --   CHL IP TREATMENT RECOMMENDATION 07/05/2017 Treatment Recommendations Therapy as outlined in treatment plan below   Prognosis 04/02/2017 Prognosis for Safe Diet Advancement Guarded Barriers to Reach Goals -- Barriers/Prognosis Comment -- CHL IP DIET RECOMMENDATION 07/06/2017 SLP Diet Recommendations -- Liquid Administration via -- Medication Administration -- Compensations Minimize environmental distractions;Follow solids with liquid Postural Changes --   CHL IP OTHER RECOMMENDATIONS 07/05/2017 Recommended Consults -- Oral Care Recommendations Oral care BID Other  Recommendations --   CHL IP FOLLOW UP RECOMMENDATIONS 07/06/2017 Follow up Recommendations None   CHL IP FREQUENCY AND DURATION 07/05/2017 Speech Therapy Frequency (ACUTE ONLY) min 1 x/week Treatment Duration 1 week      CHL IP ORAL PHASE 07/05/2017 Oral Phase Impaired Oral - Pudding Teaspoon -- Oral - Pudding Cup -- Oral - Honey Teaspoon -- Oral - Honey Cup -- Oral - Nectar Teaspoon -- Oral - Nectar Cup -- Oral - Nectar Straw -- Oral - Thin Teaspoon -- Oral - Thin Cup -- Oral - Thin Straw -- Oral - Puree Decreased bolus cohesion Oral - Mech Soft -- Oral - Regular -- Oral - Multi-Consistency -- Oral - Pill -- Oral Phase - Comment --  CHL IP PHARYNGEAL PHASE 07/05/2017 Pharyngeal Phase Impaired Pharyngeal- Pudding Teaspoon -- Pharyngeal -- Pharyngeal- Pudding Cup -- Pharyngeal -- Pharyngeal- Honey Teaspoon -- Pharyngeal -- Pharyngeal- Honey Cup -- Pharyngeal -- Pharyngeal- Nectar Teaspoon -- Pharyngeal -- Pharyngeal- Nectar Cup -- Pharyngeal -- Pharyngeal- Nectar Straw -- Pharyngeal -- Pharyngeal- Thin Teaspoon -- Pharyngeal -- Pharyngeal- Thin Cup -- Pharyngeal -- Pharyngeal- Thin Straw Penetration/Aspiration before swallow;Delayed swallow initiation-vallecula Pharyngeal Material enters airway, remains ABOVE vocal cords then ejected out Pharyngeal- Puree -- Pharyngeal -- Pharyngeal-  Mechanical Soft -- Pharyngeal -- Pharyngeal- Regular -- Pharyngeal -- Pharyngeal- Multi-consistency -- Pharyngeal -- Pharyngeal- Pill -- Pharyngeal -- Pharyngeal Comment --  CHL IP CERVICAL ESOPHAGEAL PHASE 07/05/2017 Cervical Esophageal Phase Impaired Pudding Teaspoon -- Pudding Cup -- Honey Teaspoon -- Honey Cup -- Nectar Teaspoon -- Nectar Cup -- Nectar Straw -- Thin Teaspoon -- Thin Cup -- Thin Straw Other (Comment) Puree Other (Comment) Mechanical Soft -- Regular -- Multi-consistency -- Pill -- Cervical Esophageal Comment -- No flowsheet data found. [name] 07/06/2017, 1:09 PM               Microbiology: Recent  Results (from the past 240 hour(s))  Urine culture     Status: Abnormal   Collection Time: 07/03/17  6:07 PM  Result Value Ref Range Status   Specimen Description URINE, CATHETERIZED  Final   Special Requests NONE  Final   Culture >=100,000 COLONIES/mL PROTEUS MIRABILIS (A)  Final   Report Status 07/06/2017 FINAL  Final   Organism ID, Bacteria PROTEUS MIRABILIS (A)  Final      Susceptibility   Proteus mirabilis - MIC*    AMPICILLIN >=32 RESISTANT Resistant     CEFAZOLIN >=64 RESISTANT Resistant     CEFTRIAXONE >=64 RESISTANT Resistant     CIPROFLOXACIN >=4 RESISTANT Resistant     GENTAMICIN <=1 SENSITIVE Sensitive     IMIPENEM <=0.25 SENSITIVE Sensitive     NITROFURANTOIN >=512 RESISTANT Resistant     TRIMETH/SULFA >=320 RESISTANT Resistant     AMPICILLIN/SULBACTAM 4 SENSITIVE Sensitive     PIP/TAZO <=4 SENSITIVE Sensitive     * >=100,000 COLONIES/mL PROTEUS MIRABILIS  Respiratory Panel by PCR     Status: Abnormal   Collection Time: 07/03/17  9:00 PM  Result Value Ref Range Status   Adenovirus NOT DETECTED NOT DETECTED Final   Coronavirus 229E NOT DETECTED NOT DETECTED Final   Coronavirus HKU1 NOT DETECTED NOT DETECTED Final   Coronavirus NL63 NOT DETECTED NOT DETECTED Final   Coronavirus OC43 NOT DETECTED NOT DETECTED Final   Metapneumovirus NOT DETECTED NOT DETECTED Final   Rhinovirus / Enterovirus NOT DETECTED NOT DETECTED Final   Influenza A NOT DETECTED NOT DETECTED Final   Influenza B NOT DETECTED NOT DETECTED Final   Parainfluenza Virus 1 NOT DETECTED NOT DETECTED Final   Parainfluenza Virus 2 NOT DETECTED NOT DETECTED Final   Parainfluenza Virus 3 NOT DETECTED NOT DETECTED Final   Parainfluenza Virus 4 NOT DETECTED NOT DETECTED Final   Respiratory Syncytial Virus DETECTED (A) NOT DETECTED Final    Comment: CRITICAL RESULT CALLED TO, READ BACK BY AND VERIFIED WITH: B. SHEPPARD,RN [id number] 07/04/2017 T. TYSOR    Bordetella pertussis NOT DETECTED NOT DETECTED Final    Chlamydophila pneumoniae NOT DETECTED NOT DETECTED Final   Mycoplasma pneumoniae NOT DETECTED NOT DETECTED Final  Culture, blood (routine x 2)     Status: None   Collection Time: 07/03/17 10:04 PM  Result Value Ref Range Status   Specimen Description BLOOD LEFT HAND  Final   Special Requests IN PEDIATRIC BOTTLE Blood Culture adequate volume  Final   Culture NO GROWTH 5 DAYS  Final   Report Status 07/08/2017 FINAL  Final  MRSA PCR Screening     Status: Abnormal   Collection Time: 07/04/17 12:12 AM  Result Value Ref Range Status   MRSA by PCR POSITIVE (A) NEGATIVE Final    Comment:        The GeneXpert MRSA Assay (FDA approved for NASAL specimens  only), is one component of a comprehensive MRSA colonization surveillance program. It is not intended to diagnose MRSA infection nor to guide or monitor treatment for MRSA infections. RESULT CALLED TO, READ BACK BY AND VERIFIED WITH: SSonnie Alamo 0215 07/04/2017 T. TYSOR   Culture, blood (routine x 2)     Status: None (Preliminary result)   Collection Time: 07/04/17 12:14 AM  Result Value Ref Range Status   Specimen Description BLOOD RIGHT HAND  Final   Special Requests IN PEDIATRIC BOTTLE Blood Culture adequate volume  Final   Culture NO GROWTH 4 DAYS  Final   Report Status PENDING  Incomplete     Labs: Basic Metabolic Panel: Recent Labs  Lab 07/03/17 1533 07/03/17 1544 07/03/17 2215 07/04/17 0014 07/05/17 0334 07/06/17 0604  NA 132* 134*  --  133* 135 136  K 5.1 5.0  --  5.0 4.6 4.6  CL 108 109  --  110 110 112*  CO2 19*  --   --  17* 18* 19*  GLUCOSE 143* 145*  --  135* 97 79  BUN 22* 26*  --  23* 28* 23*  CREATININE 1.21* 1.30*  --  1.10* 1.10* 0.77  CALCIUM 9.0  --   --  8.4* 8.5* 8.8*  MG  --   --  2.6*  --   --  2.2  PHOS  --   --  4.5 4.3  --  1.6*   Liver Function Tests: Recent Labs  Lab 07/03/17 1533 07/04/17 0014  AST 85*  --   ALT 32  --   ALKPHOS 145*  --   BILITOT 0.4  --   PROT 6.9  --   ALBUMIN  2.7* 2.5*   CBC: Recent Labs  Lab 07/03/17 1533  07/04/17 0014 07/05/17 0334 07/05/17 1818 07/06/17 0604 07/08/17 0450  WBC 7.7  --  6.2 10.3  --  10.7* 10.6*  NEUTROABS 6.8  --   --   --   --   --   --   HGB 9.2*   < > 8.1* 7.3* 7.6* 8.4* 8.4*  HCT 29.6*   < > 25.3* 22.9* 24.0* 26.4* 25.4*  MCV 97.7  --  98.4 96.2  --  95.3 94.1  PLT 382  --  316 286  --  280 281   < > = values in this interval not displayed.    Recent Labs    10/15/16 0439 03/21/17 1455 07/03/17 2210  BNP 477.4* 862.9* 1,208.0*   CBG: Recent Labs  Lab 07/05/17 2223 07/06/17 0113 07/06/17 0445 07/06/17 0739 07/06/17 1216  GLUCAP 100* 88 76 88 114*    Principal Problem:   Acute respiratory failure with hypoxia and hypercapnia (HCC) Active Problems:   Atrial fibrillation (HCC)   Hypothyroidism   Normocytic anemia   COPD with acute exacerbation (HCC)   Cardiomyopathy ([place])   UTI (urinary tract infection)   Acute metabolic encephalopathy   Pressure injury of skin   Acute on chronic combined systolic and diastolic CHF (congestive heart failure) (HCC)   Chronic respiratory failure with hypoxia ([place])   Aortic atherosclerosis ([place])   Time coordinating discharge: 35 minutes  Signed:  Murray Hodgkins, MD [facility] 07/09/2017, 11:32 AM

## 2017-07-09 NOTE — Care Management Note (Addendum)
Case Management Note  Patient Details  Name: [name] MRN: [id number] Date of Birth: January 26, 1927  Subjective/Objective:    Discharge summary faxed to [facility].                Action/Plan:Added SP to home health Estill Bamberg at [facility] [place] aware. Faxed updated orders   Spoke to patient's daughter Elvera Lennox [phone]. Gail aware discharge is today. Wants to continue with [facility] RN,PT, aide. Orders and face to face called and faxed to [facility] at [facility]. Meredith aware discharge is today. Will fax discharge summary once ready.   [facility] phone [phone] fax [phone].   Ms Riki Rusk requesting ambulance transport home for patient. Confirmed face sheet information with Ms Riki Rusk. Ms. Princess Bruins daughter on way to hospital now and would like to ride in ambulance home with her grandmother. Explained to Ms Riki Rusk at time of discharge when [facility] called for transport will ask if grand daughter [name] can accompany patient home in ambulance.   Also confirmed patient has home oxygen and all needed DME in home already.   Ambulance paper work in shadow draw , Engineer, manufacturing aware.  Expected Discharge Date:                  Expected Discharge Plan:  [place]  In-House Referral:     Discharge planning Services  CM Consult  Post Acute Care Choice:  Home Health Choice offered to:     DME Arranged:    DME Agency:     HH Arranged:  RN, PT, Nurse's Aide [place] Agency:  Other - See comment  Status of Service:  Completed, signed off  If discussed at [place] of Stay Meetings, dates discussed:    Additional Comments:  Marilu Favre, RN 07/09/2017, 10:04 AM

## 2017-07-09 NOTE — Progress Notes (Signed)
[name] discharged per MD order. Discussed with the patient granddaughter, [name] and all questions fully answered.  IV catheter discontinued intact. Site without signs and symptoms of complications. Dressing and pressure applied.  An After Visit Summary was printed and given to the patients granddaughter, [name]. Also received prescription.  D/c education completed with  Granddaughter [name] including follow up instructions, medication list, d/c activities limitations if indicated, with other d/c instructions as indicated by MD - granddaughter, [name] verbalized understanding, all questions fully answered.   Patient instructed to return to ED, call 911, or call MD for any changes in condition.   Patient will be transported home via [place] and granddaughter [name] will accompany.

## 2017-07-09 NOTE — Progress Notes (Signed)
ANTICOAGULATION CONSULT NOTE - Initial Consult  Pharmacy Consult for warfarin Indication: atrial fibrillation  No Known Allergies  Patient Measurements: Height: 5\' 7"  (659.9 cm) Weight: 126 lb 11.2 oz (57.5 kg) IBW/kg (Calculated) : 61.6  Assessment: 89 yof on Coumadin 1.5mg  daily exc for 3mg  on Tues/Thurs PTA for Afib. Admit INR 9.48 >> vit K 5mg  IV given 11/17. Coumadin was held 11/19 due to low Hgb but resumed 11/20. Coumadin dose was again held 11/21 due to significant jump in INR after 3mg  dose.   INR up to 2.86 today (rise of 0.68 in 24hrs after lower dose of 1.5mg  yesterday). Hgb 8.4, plts wnl on 11/21. No bleeding noted.   Goal of Therapy:  INR 2-3 Monitor platelets by anticoagulation protocol: Yes   Plan:  Hold Coumadin tonight.  If discharge today, recommend holding Coumadin tonight and prescribing lower home dose of 1.5mg  po daily with INR check on Monday or Tuesday if possible.  Monitor daily INR, CBC, s/s of bleed  Sloan Leiter, PharmD, BCPS, BCCCP Clinical Pharmacist Clinical phone 07/09/2017 until 3:30PM - [phone] After hours, please call #[phone] 07/09/2017 9:13 AM

## 2017-07-09 NOTE — Evaluation (Signed)
Occupational Therapy Evaluation Patient Details Name: [name] MRN: [id number] DOB: 1926-10-17 Today's Date: 07/09/2017    History of Present Illness 44yow PMH afib, COPD on home oxygen 3-4L, presented with acute lethargy. Admitted by PCCM for acute on chronic hypoxic respiratory failure, hypercapnic resp failure. Treated with BiPAP, found to have RSV pneumonitis/pneumonia.   Clinical Impression   Pt admitted with the above diagnoses and presents with below problem list. Pt will benefit from continued acute OT to address the below listed deficits and maximize independence with basic ADLs prior to d/c home with family providing 24 hour assist. PTA pt needing assist with bed mobility, was able to sit EOB, feed self and wash face; total A to transfer OOB to w/c or shower chair. Pt's family report change in functional status compared to her baseline. Pt with increased weakness and less alert than her usual. Granddaughter present during evaluation. Per conversations with daughter and grandaughter pt is able to provide +1-2 assist at d/c as needed.      Follow Up Recommendations  Home health OT;Supervision/Assistance - 24 hour    Equipment Recommendations  None recommended by OT    Recommendations for Other Services       Precautions / Restrictions Precautions Precautions: Fall; increased risk for skin breakdown, multiple pressure wounds Restrictions Weight Bearing Restrictions: No      Mobility Bed Mobility Overal bed mobility: Needs Assistance Bed Mobility: Supine to Sit     Supine to sit: Total assist;+2 for physical assistance;HOB elevated     General bed mobility comments: Utilized bed pad to advance hips to EOB position. Assist for advancing BLE and powering up trunk. Posterior lean coming to EOB needing max-total assist to correct.   Transfers                 General transfer comment: total A for transfers at home    Balance Overall balance assessment: Needs  assistance Sitting-balance support: Bilateral upper extremity supported;Feet supported Sitting balance-Leahy Scale: Poor Sitting balance - Comments: zero initially but progressed to fair sitting balance. Posterior lean initially.                                    ADL either performed or assessed with clinical judgement   ADL Overall ADL's : Needs assistance/impaired Eating/Feeding: Sitting;Moderate assistance;Bed level   Grooming: Maximal assistance;Sitting;Bed level Grooming Details (indicate cue type and reason): Pt unable to wash face above chin line sitting EOB.  Upper Body Bathing: Maximal assistance;Sitting;Bed level   Lower Body Bathing: Sitting/lateral leans;Bed level;Total assistance;Maximal assistance   Upper Body Dressing : Total assistance;Sitting   Lower Body Dressing: Total assistance;Sitting/lateral leans;Bed level;Maximal assistance                 General ADL Comments: Pt completed bed mobility with total +2 assist and sat EOB for about 7 minutes with BUE support. Improved sitting balance from max-total to close min guard.      Vision   Additional Comments: Noted difference in pupil sizes when comparing each eye. Pt's granddaughter reports baseline vision deficits.      Perception     Praxis      Pertinent Vitals/Pain Pain Assessment: Faces Faces Pain Scale: Hurts little more Pain Location: grimacing with bed mobility Pain Descriptors / Indicators: Grimacing Pain Intervention(s): Limited activity within patient's tolerance;Monitored during session;Repositioned     Hand Dominance  Extremity/Trunk Assessment Upper Extremity Assessment Upper Extremity Assessment: Generalized weakness;Difficult to assess due to impaired cognition   Lower Extremity Assessment Lower Extremity Assessment: Defer to PT evaluation   Cervical / Trunk Assessment Cervical / Trunk Assessment: Kyphotic   Communication Communication Communication: HOH    Cognition Arousal/Alertness: Awake/alert;Lethargic(sleepy but easy to wake up) Behavior During Therapy: Flat affect Overall Cognitive Status: History of cognitive impairments - at baseline                                 General Comments: Grandaughter reports pt is less alert this session than her baseline.    General Comments       Exercises     Shoulder Instructions      Home Living Family/patient expects to be discharged to:: Private residence Living Arrangements: Children;Other (Comment) Available Help at Discharge: Family;Available 24 hours/day Type of Home: House Home Access: Ramped entrance     Home Layout: One level     Bathroom Shower/Tub: Occupational psychologist: Standard Bathroom Accessibility: Yes   Home Equipment: Youth worker - 2 wheels;Shower seat - built in;Bedside commode;Hospital bed(hospital bed)   Additional Comments: 07/09/17 spoke with daughter via phone then grandaughter in room. Multiple family members taking turns to provide 24 hour care at baseline. Daughter reports can have +2 if needed at home. Previous OT note 04/04/07: Pts son is one of her primary caregivers; he was recently hospitalized for back sx, and the daughter has injury from helping to lift and care for her mother as well. She also has a lift chair       Prior Functioning/Environment Level of Independence: Needs assistance  Gait / Transfers Assistance Needed: Transfers only with assistance, does scoot transfer or sliding board. Family makes sure she is out of bed daily, has to transfer from hosptial bed to [facility] then to the lift chair/recliner  ADL's / Homemaking Assistance Needed: pt does self feeding and grooming and helps some with UB adls. Sit for short periods of time EOB (<30 min) "we don't leave her like that long."             OT Problem List: Decreased strength;Decreased activity tolerance;Impaired balance (sitting and/or standing);Decreased  cognition;Decreased knowledge of use of DME or AE;Decreased knowledge of precautions;Impaired UE functional use;Pain      OT Treatment/Interventions: Self-care/ADL training;Therapeutic exercise;Energy conservation;DME and/or AE instruction;Therapeutic activities;Patient/family education;Balance training;Cognitive remediation/compensation    OT Goals(Current goals can be found in the care plan section) Acute Rehab OT Goals Patient Stated Goal: family goal: Pt back to baseline function. OT Goal Formulation: With family Time For Goal Achievement: 07/23/17 Potential to Achieve Goals: Good ADL Goals Pt Will Perform Eating: with min guard assist;sitting Pt Will Perform Grooming: with mod assist;sitting Additional ADL Goal #1: Pt will be min A +2 with bed mobility to prepare for OOB ADLs and functional transfers. Additional ADL Goal #2: Pt will sit EOB for 8 minutes to engage in functional task with min guard assist.  OT Frequency: Min 2X/week   Barriers to D/C:            Co-evaluation PT/OT/SLP Co-Evaluation/Treatment: Yes Reason for Co-Treatment: For patient/therapist safety;To address functional/ADL transfers   OT goals addressed during session: ADL's and self-care      AM-PAC PT "6 Clicks" Daily Activity     Outcome Measure Help from another person eating meals?: A Lot Help from another person taking  care of personal grooming?: Total Help from another person toileting, which includes using toliet, bedpan, or urinal?: Total Help from another person bathing (including washing, rinsing, drying)?: A Lot Help from another person to put on and taking off regular upper body clothing?: A Lot Help from another person to put on and taking off regular lower body clothing?: Total 6 Click Score: 9   End of Session Equipment Utilized During Treatment: Oxygen  Activity Tolerance: Patient tolerated treatment well;Patient limited by fatigue Patient left: in bed;with call bell/phone within  reach;with bed alarm set;with family/visitor present  OT Visit Diagnosis: Muscle weakness (generalized) (M62.81);Pain                Time: 1027-1100 OT Time Calculation (min): 33 min Charges:  OT General Charges $OT Visit: 1 Visit OT Evaluation $OT Eval Moderate Complexity: 1 Mod G-Codes:       Hortencia Pilar 07/09/2017, 11:30 AM

## 2017-07-10 DIAGNOSIS — I5042 Chronic combined systolic (congestive) and diastolic (congestive) heart failure: Secondary | ICD-10-CM | POA: Diagnosis not present

## 2017-07-10 DIAGNOSIS — T84223D Displacement of internal fixation device of bones of foot and toes, subsequent encounter: Secondary | ICD-10-CM | POA: Diagnosis not present

## 2017-07-10 DIAGNOSIS — L89613 Pressure ulcer of right heel, stage 3: Secondary | ICD-10-CM | POA: Diagnosis not present

## 2017-07-10 DIAGNOSIS — J441 Chronic obstructive pulmonary disease with (acute) exacerbation: Secondary | ICD-10-CM | POA: Diagnosis not present

## 2017-07-10 DIAGNOSIS — I110 Hypertensive heart disease with heart failure: Secondary | ICD-10-CM | POA: Diagnosis not present

## 2017-07-10 DIAGNOSIS — L89152 Pressure ulcer of sacral region, stage 2: Secondary | ICD-10-CM | POA: Diagnosis not present

## 2017-07-12 DIAGNOSIS — I110 Hypertensive heart disease with heart failure: Secondary | ICD-10-CM | POA: Diagnosis not present

## 2017-07-12 DIAGNOSIS — T84223D Displacement of internal fixation device of bones of foot and toes, subsequent encounter: Secondary | ICD-10-CM | POA: Diagnosis not present

## 2017-07-12 DIAGNOSIS — L89613 Pressure ulcer of right heel, stage 3: Secondary | ICD-10-CM | POA: Diagnosis not present

## 2017-07-12 DIAGNOSIS — I5042 Chronic combined systolic (congestive) and diastolic (congestive) heart failure: Secondary | ICD-10-CM | POA: Diagnosis not present

## 2017-07-12 DIAGNOSIS — J9621 Acute and chronic respiratory failure with hypoxia: Secondary | ICD-10-CM | POA: Diagnosis not present

## 2017-07-12 DIAGNOSIS — L89152 Pressure ulcer of sacral region, stage 2: Secondary | ICD-10-CM | POA: Diagnosis not present

## 2017-07-14 DIAGNOSIS — I110 Hypertensive heart disease with heart failure: Secondary | ICD-10-CM | POA: Diagnosis not present

## 2017-07-14 DIAGNOSIS — L89152 Pressure ulcer of sacral region, stage 2: Secondary | ICD-10-CM | POA: Diagnosis not present

## 2017-07-14 DIAGNOSIS — T84223D Displacement of internal fixation device of bones of foot and toes, subsequent encounter: Secondary | ICD-10-CM | POA: Diagnosis not present

## 2017-07-14 DIAGNOSIS — I5042 Chronic combined systolic (congestive) and diastolic (congestive) heart failure: Secondary | ICD-10-CM | POA: Diagnosis not present

## 2017-07-14 DIAGNOSIS — J9621 Acute and chronic respiratory failure with hypoxia: Secondary | ICD-10-CM | POA: Diagnosis not present

## 2017-07-14 DIAGNOSIS — L89613 Pressure ulcer of right heel, stage 3: Secondary | ICD-10-CM | POA: Diagnosis not present

## 2017-07-16 DIAGNOSIS — I5042 Chronic combined systolic (congestive) and diastolic (congestive) heart failure: Secondary | ICD-10-CM | POA: Diagnosis not present

## 2017-07-16 DIAGNOSIS — T84223D Displacement of internal fixation device of bones of foot and toes, subsequent encounter: Secondary | ICD-10-CM | POA: Diagnosis not present

## 2017-07-16 DIAGNOSIS — I110 Hypertensive heart disease with heart failure: Secondary | ICD-10-CM | POA: Diagnosis not present

## 2017-07-16 DIAGNOSIS — L89613 Pressure ulcer of right heel, stage 3: Secondary | ICD-10-CM | POA: Diagnosis not present

## 2017-07-16 DIAGNOSIS — L89152 Pressure ulcer of sacral region, stage 2: Secondary | ICD-10-CM | POA: Diagnosis not present

## 2017-07-16 DIAGNOSIS — J9621 Acute and chronic respiratory failure with hypoxia: Secondary | ICD-10-CM | POA: Diagnosis not present

## 2017-07-20 DIAGNOSIS — I110 Hypertensive heart disease with heart failure: Secondary | ICD-10-CM | POA: Diagnosis not present

## 2017-07-20 DIAGNOSIS — T84223D Displacement of internal fixation device of bones of foot and toes, subsequent encounter: Secondary | ICD-10-CM | POA: Diagnosis not present

## 2017-07-20 DIAGNOSIS — J9621 Acute and chronic respiratory failure with hypoxia: Secondary | ICD-10-CM | POA: Diagnosis not present

## 2017-07-20 DIAGNOSIS — L89613 Pressure ulcer of right heel, stage 3: Secondary | ICD-10-CM | POA: Diagnosis not present

## 2017-07-20 DIAGNOSIS — I5042 Chronic combined systolic (congestive) and diastolic (congestive) heart failure: Secondary | ICD-10-CM | POA: Diagnosis not present

## 2017-07-20 DIAGNOSIS — L89152 Pressure ulcer of sacral region, stage 2: Secondary | ICD-10-CM | POA: Diagnosis not present

## 2017-07-23 DIAGNOSIS — I5042 Chronic combined systolic (congestive) and diastolic (congestive) heart failure: Secondary | ICD-10-CM | POA: Diagnosis not present

## 2017-07-23 DIAGNOSIS — T84223D Displacement of internal fixation device of bones of foot and toes, subsequent encounter: Secondary | ICD-10-CM | POA: Diagnosis not present

## 2017-07-23 DIAGNOSIS — I110 Hypertensive heart disease with heart failure: Secondary | ICD-10-CM | POA: Diagnosis not present

## 2017-07-23 DIAGNOSIS — J9621 Acute and chronic respiratory failure with hypoxia: Secondary | ICD-10-CM | POA: Diagnosis not present

## 2017-07-23 DIAGNOSIS — L89613 Pressure ulcer of right heel, stage 3: Secondary | ICD-10-CM | POA: Diagnosis not present

## 2017-07-23 DIAGNOSIS — L89152 Pressure ulcer of sacral region, stage 2: Secondary | ICD-10-CM | POA: Diagnosis not present

## 2017-07-29 DIAGNOSIS — L89613 Pressure ulcer of right heel, stage 3: Secondary | ICD-10-CM | POA: Diagnosis not present

## 2017-07-29 DIAGNOSIS — J9621 Acute and chronic respiratory failure with hypoxia: Secondary | ICD-10-CM | POA: Diagnosis not present

## 2017-07-29 DIAGNOSIS — T84223D Displacement of internal fixation device of bones of foot and toes, subsequent encounter: Secondary | ICD-10-CM | POA: Diagnosis not present

## 2017-07-29 DIAGNOSIS — L89152 Pressure ulcer of sacral region, stage 2: Secondary | ICD-10-CM | POA: Diagnosis not present

## 2017-07-29 DIAGNOSIS — I110 Hypertensive heart disease with heart failure: Secondary | ICD-10-CM | POA: Diagnosis not present

## 2017-07-29 DIAGNOSIS — I5042 Chronic combined systolic (congestive) and diastolic (congestive) heart failure: Secondary | ICD-10-CM | POA: Diagnosis not present

## 2017-08-05 DIAGNOSIS — T84223D Displacement of internal fixation device of bones of foot and toes, subsequent encounter: Secondary | ICD-10-CM | POA: Diagnosis not present

## 2017-08-05 DIAGNOSIS — I5042 Chronic combined systolic (congestive) and diastolic (congestive) heart failure: Secondary | ICD-10-CM | POA: Diagnosis not present

## 2017-08-05 DIAGNOSIS — J9621 Acute and chronic respiratory failure with hypoxia: Secondary | ICD-10-CM | POA: Diagnosis not present

## 2017-08-05 DIAGNOSIS — L89152 Pressure ulcer of sacral region, stage 2: Secondary | ICD-10-CM | POA: Diagnosis not present

## 2017-08-05 DIAGNOSIS — I110 Hypertensive heart disease with heart failure: Secondary | ICD-10-CM | POA: Diagnosis not present

## 2017-08-05 DIAGNOSIS — L89613 Pressure ulcer of right heel, stage 3: Secondary | ICD-10-CM | POA: Diagnosis not present

## 2017-08-13 DIAGNOSIS — L89613 Pressure ulcer of right heel, stage 3: Secondary | ICD-10-CM | POA: Diagnosis not present

## 2017-08-13 DIAGNOSIS — J9621 Acute and chronic respiratory failure with hypoxia: Secondary | ICD-10-CM | POA: Diagnosis not present

## 2017-08-13 DIAGNOSIS — I5042 Chronic combined systolic (congestive) and diastolic (congestive) heart failure: Secondary | ICD-10-CM | POA: Diagnosis not present

## 2017-08-13 DIAGNOSIS — L89152 Pressure ulcer of sacral region, stage 2: Secondary | ICD-10-CM | POA: Diagnosis not present

## 2017-08-13 DIAGNOSIS — T84223D Displacement of internal fixation device of bones of foot and toes, subsequent encounter: Secondary | ICD-10-CM | POA: Diagnosis not present

## 2017-08-13 DIAGNOSIS — I110 Hypertensive heart disease with heart failure: Secondary | ICD-10-CM | POA: Diagnosis not present

## 2017-08-18 DIAGNOSIS — L89613 Pressure ulcer of right heel, stage 3: Secondary | ICD-10-CM | POA: Diagnosis not present

## 2017-08-18 DIAGNOSIS — L89152 Pressure ulcer of sacral region, stage 2: Secondary | ICD-10-CM | POA: Diagnosis not present

## 2017-08-18 DIAGNOSIS — T84223D Displacement of internal fixation device of bones of foot and toes, subsequent encounter: Secondary | ICD-10-CM | POA: Diagnosis not present

## 2017-08-18 DIAGNOSIS — I110 Hypertensive heart disease with heart failure: Secondary | ICD-10-CM | POA: Diagnosis not present

## 2017-08-18 DIAGNOSIS — J9621 Acute and chronic respiratory failure with hypoxia: Secondary | ICD-10-CM | POA: Diagnosis not present

## 2017-08-18 DIAGNOSIS — I5042 Chronic combined systolic (congestive) and diastolic (congestive) heart failure: Secondary | ICD-10-CM | POA: Diagnosis not present

## 2017-08-24 DIAGNOSIS — I5042 Chronic combined systolic (congestive) and diastolic (congestive) heart failure: Secondary | ICD-10-CM | POA: Diagnosis not present

## 2017-08-24 DIAGNOSIS — J9621 Acute and chronic respiratory failure with hypoxia: Secondary | ICD-10-CM | POA: Diagnosis not present

## 2017-08-24 DIAGNOSIS — L89152 Pressure ulcer of sacral region, stage 2: Secondary | ICD-10-CM | POA: Diagnosis not present

## 2017-08-24 DIAGNOSIS — L89613 Pressure ulcer of right heel, stage 3: Secondary | ICD-10-CM | POA: Diagnosis not present

## 2017-08-24 DIAGNOSIS — I110 Hypertensive heart disease with heart failure: Secondary | ICD-10-CM | POA: Diagnosis not present

## 2017-08-24 DIAGNOSIS — T84223D Displacement of internal fixation device of bones of foot and toes, subsequent encounter: Secondary | ICD-10-CM | POA: Diagnosis not present

## 2017-09-01 DIAGNOSIS — L89613 Pressure ulcer of right heel, stage 3: Secondary | ICD-10-CM | POA: Diagnosis not present

## 2017-09-01 DIAGNOSIS — I110 Hypertensive heart disease with heart failure: Secondary | ICD-10-CM | POA: Diagnosis not present

## 2017-09-01 DIAGNOSIS — T84223D Displacement of internal fixation device of bones of foot and toes, subsequent encounter: Secondary | ICD-10-CM | POA: Diagnosis not present

## 2017-09-01 DIAGNOSIS — L89152 Pressure ulcer of sacral region, stage 2: Secondary | ICD-10-CM | POA: Diagnosis not present

## 2017-09-01 DIAGNOSIS — J9621 Acute and chronic respiratory failure with hypoxia: Secondary | ICD-10-CM | POA: Diagnosis not present

## 2017-09-01 DIAGNOSIS — I5042 Chronic combined systolic (congestive) and diastolic (congestive) heart failure: Secondary | ICD-10-CM | POA: Diagnosis not present

## 2017-09-09 DIAGNOSIS — L89613 Pressure ulcer of right heel, stage 3: Secondary | ICD-10-CM | POA: Diagnosis not present

## 2017-09-09 DIAGNOSIS — L89152 Pressure ulcer of sacral region, stage 2: Secondary | ICD-10-CM | POA: Diagnosis not present

## 2017-09-09 DIAGNOSIS — J9621 Acute and chronic respiratory failure with hypoxia: Secondary | ICD-10-CM | POA: Diagnosis not present

## 2017-09-09 DIAGNOSIS — I5042 Chronic combined systolic (congestive) and diastolic (congestive) heart failure: Secondary | ICD-10-CM | POA: Diagnosis not present

## 2017-09-09 DIAGNOSIS — I110 Hypertensive heart disease with heart failure: Secondary | ICD-10-CM | POA: Diagnosis not present

## 2017-09-09 DIAGNOSIS — T84223D Displacement of internal fixation device of bones of foot and toes, subsequent encounter: Secondary | ICD-10-CM | POA: Diagnosis not present

## 2017-09-10 DIAGNOSIS — J9611 Chronic respiratory failure with hypoxia: Secondary | ICD-10-CM | POA: Diagnosis not present

## 2017-09-10 DIAGNOSIS — I110 Hypertensive heart disease with heart failure: Secondary | ICD-10-CM | POA: Diagnosis not present

## 2017-09-10 DIAGNOSIS — L89152 Pressure ulcer of sacral region, stage 2: Secondary | ICD-10-CM | POA: Diagnosis not present

## 2017-09-10 DIAGNOSIS — T84223D Displacement of internal fixation device of bones of foot and toes, subsequent encounter: Secondary | ICD-10-CM | POA: Diagnosis not present

## 2017-09-10 DIAGNOSIS — I5042 Chronic combined systolic (congestive) and diastolic (congestive) heart failure: Secondary | ICD-10-CM | POA: Diagnosis not present

## 2017-09-10 DIAGNOSIS — L89613 Pressure ulcer of right heel, stage 3: Secondary | ICD-10-CM | POA: Diagnosis not present

## 2017-09-17 DIAGNOSIS — I110 Hypertensive heart disease with heart failure: Secondary | ICD-10-CM | POA: Diagnosis not present

## 2017-09-17 DIAGNOSIS — L89152 Pressure ulcer of sacral region, stage 2: Secondary | ICD-10-CM | POA: Diagnosis not present

## 2017-09-17 DIAGNOSIS — I5042 Chronic combined systolic (congestive) and diastolic (congestive) heart failure: Secondary | ICD-10-CM | POA: Diagnosis not present

## 2017-09-17 DIAGNOSIS — J9611 Chronic respiratory failure with hypoxia: Secondary | ICD-10-CM | POA: Diagnosis not present

## 2017-09-17 DIAGNOSIS — L89613 Pressure ulcer of right heel, stage 3: Secondary | ICD-10-CM | POA: Diagnosis not present

## 2017-09-17 DIAGNOSIS — T84223D Displacement of internal fixation device of bones of foot and toes, subsequent encounter: Secondary | ICD-10-CM | POA: Diagnosis not present

## 2017-09-24 DIAGNOSIS — T84223D Displacement of internal fixation device of bones of foot and toes, subsequent encounter: Secondary | ICD-10-CM | POA: Diagnosis not present

## 2017-09-24 DIAGNOSIS — J9611 Chronic respiratory failure with hypoxia: Secondary | ICD-10-CM | POA: Diagnosis not present

## 2017-09-24 DIAGNOSIS — I5042 Chronic combined systolic (congestive) and diastolic (congestive) heart failure: Secondary | ICD-10-CM | POA: Diagnosis not present

## 2017-09-24 DIAGNOSIS — I110 Hypertensive heart disease with heart failure: Secondary | ICD-10-CM | POA: Diagnosis not present

## 2017-09-24 DIAGNOSIS — L89613 Pressure ulcer of right heel, stage 3: Secondary | ICD-10-CM | POA: Diagnosis not present

## 2017-09-24 DIAGNOSIS — L89152 Pressure ulcer of sacral region, stage 2: Secondary | ICD-10-CM | POA: Diagnosis not present

## 2017-10-01 DIAGNOSIS — L89613 Pressure ulcer of right heel, stage 3: Secondary | ICD-10-CM | POA: Diagnosis not present

## 2017-10-01 DIAGNOSIS — I5042 Chronic combined systolic (congestive) and diastolic (congestive) heart failure: Secondary | ICD-10-CM | POA: Diagnosis not present

## 2017-10-01 DIAGNOSIS — T84223D Displacement of internal fixation device of bones of foot and toes, subsequent encounter: Secondary | ICD-10-CM | POA: Diagnosis not present

## 2017-10-01 DIAGNOSIS — L89152 Pressure ulcer of sacral region, stage 2: Secondary | ICD-10-CM | POA: Diagnosis not present

## 2017-10-01 DIAGNOSIS — I110 Hypertensive heart disease with heart failure: Secondary | ICD-10-CM | POA: Diagnosis not present

## 2017-10-01 DIAGNOSIS — J9611 Chronic respiratory failure with hypoxia: Secondary | ICD-10-CM | POA: Diagnosis not present

## 2017-10-08 DIAGNOSIS — L89152 Pressure ulcer of sacral region, stage 2: Secondary | ICD-10-CM | POA: Diagnosis not present

## 2017-10-08 DIAGNOSIS — I5042 Chronic combined systolic (congestive) and diastolic (congestive) heart failure: Secondary | ICD-10-CM | POA: Diagnosis not present

## 2017-10-08 DIAGNOSIS — T84223D Displacement of internal fixation device of bones of foot and toes, subsequent encounter: Secondary | ICD-10-CM | POA: Diagnosis not present

## 2017-10-08 DIAGNOSIS — J9611 Chronic respiratory failure with hypoxia: Secondary | ICD-10-CM | POA: Diagnosis not present

## 2017-10-08 DIAGNOSIS — L89613 Pressure ulcer of right heel, stage 3: Secondary | ICD-10-CM | POA: Diagnosis not present

## 2017-10-08 DIAGNOSIS — I110 Hypertensive heart disease with heart failure: Secondary | ICD-10-CM | POA: Diagnosis not present

## 2017-10-13 DIAGNOSIS — L89152 Pressure ulcer of sacral region, stage 2: Secondary | ICD-10-CM | POA: Diagnosis not present

## 2017-10-13 DIAGNOSIS — I110 Hypertensive heart disease with heart failure: Secondary | ICD-10-CM | POA: Diagnosis not present

## 2017-10-13 DIAGNOSIS — L89613 Pressure ulcer of right heel, stage 3: Secondary | ICD-10-CM | POA: Diagnosis not present

## 2017-10-13 DIAGNOSIS — I5042 Chronic combined systolic (congestive) and diastolic (congestive) heart failure: Secondary | ICD-10-CM | POA: Diagnosis not present

## 2017-10-13 DIAGNOSIS — T84223D Displacement of internal fixation device of bones of foot and toes, subsequent encounter: Secondary | ICD-10-CM | POA: Diagnosis not present

## 2017-10-13 DIAGNOSIS — J9611 Chronic respiratory failure with hypoxia: Secondary | ICD-10-CM | POA: Diagnosis not present

## 2017-10-15 DIAGNOSIS — L89613 Pressure ulcer of right heel, stage 3: Secondary | ICD-10-CM | POA: Diagnosis not present

## 2017-10-15 DIAGNOSIS — L89152 Pressure ulcer of sacral region, stage 2: Secondary | ICD-10-CM | POA: Diagnosis not present

## 2017-10-15 DIAGNOSIS — T84223D Displacement of internal fixation device of bones of foot and toes, subsequent encounter: Secondary | ICD-10-CM | POA: Diagnosis not present

## 2017-10-15 DIAGNOSIS — I5042 Chronic combined systolic (congestive) and diastolic (congestive) heart failure: Secondary | ICD-10-CM | POA: Diagnosis not present

## 2017-10-15 DIAGNOSIS — I110 Hypertensive heart disease with heart failure: Secondary | ICD-10-CM | POA: Diagnosis not present

## 2017-10-15 DIAGNOSIS — J9611 Chronic respiratory failure with hypoxia: Secondary | ICD-10-CM | POA: Diagnosis not present

## 2017-10-22 DIAGNOSIS — J9611 Chronic respiratory failure with hypoxia: Secondary | ICD-10-CM | POA: Diagnosis not present

## 2017-10-22 DIAGNOSIS — T84223D Displacement of internal fixation device of bones of foot and toes, subsequent encounter: Secondary | ICD-10-CM | POA: Diagnosis not present

## 2017-10-22 DIAGNOSIS — I110 Hypertensive heart disease with heart failure: Secondary | ICD-10-CM | POA: Diagnosis not present

## 2017-10-22 DIAGNOSIS — L89152 Pressure ulcer of sacral region, stage 2: Secondary | ICD-10-CM | POA: Diagnosis not present

## 2017-10-22 DIAGNOSIS — I5042 Chronic combined systolic (congestive) and diastolic (congestive) heart failure: Secondary | ICD-10-CM | POA: Diagnosis not present

## 2017-10-22 DIAGNOSIS — L89613 Pressure ulcer of right heel, stage 3: Secondary | ICD-10-CM | POA: Diagnosis not present

## 2017-10-29 DIAGNOSIS — J9611 Chronic respiratory failure with hypoxia: Secondary | ICD-10-CM | POA: Diagnosis not present

## 2017-10-29 DIAGNOSIS — L89613 Pressure ulcer of right heel, stage 3: Secondary | ICD-10-CM | POA: Diagnosis not present

## 2017-10-29 DIAGNOSIS — T84223D Displacement of internal fixation device of bones of foot and toes, subsequent encounter: Secondary | ICD-10-CM | POA: Diagnosis not present

## 2017-10-29 DIAGNOSIS — I5042 Chronic combined systolic (congestive) and diastolic (congestive) heart failure: Secondary | ICD-10-CM | POA: Diagnosis not present

## 2017-10-29 DIAGNOSIS — I110 Hypertensive heart disease with heart failure: Secondary | ICD-10-CM | POA: Diagnosis not present

## 2017-10-29 DIAGNOSIS — L89152 Pressure ulcer of sacral region, stage 2: Secondary | ICD-10-CM | POA: Diagnosis not present

## 2017-11-05 DIAGNOSIS — I5042 Chronic combined systolic (congestive) and diastolic (congestive) heart failure: Secondary | ICD-10-CM | POA: Diagnosis not present

## 2017-11-05 DIAGNOSIS — L89613 Pressure ulcer of right heel, stage 3: Secondary | ICD-10-CM | POA: Diagnosis not present

## 2017-11-05 DIAGNOSIS — L89152 Pressure ulcer of sacral region, stage 2: Secondary | ICD-10-CM | POA: Diagnosis not present

## 2017-11-05 DIAGNOSIS — J9611 Chronic respiratory failure with hypoxia: Secondary | ICD-10-CM | POA: Diagnosis not present

## 2017-11-05 DIAGNOSIS — I110 Hypertensive heart disease with heart failure: Secondary | ICD-10-CM | POA: Diagnosis not present

## 2017-11-05 DIAGNOSIS — T84223D Displacement of internal fixation device of bones of foot and toes, subsequent encounter: Secondary | ICD-10-CM | POA: Diagnosis not present

## 2017-11-09 DIAGNOSIS — L89613 Pressure ulcer of right heel, stage 3: Secondary | ICD-10-CM | POA: Diagnosis not present

## 2017-11-09 DIAGNOSIS — J9611 Chronic respiratory failure with hypoxia: Secondary | ICD-10-CM | POA: Diagnosis not present

## 2017-11-09 DIAGNOSIS — I110 Hypertensive heart disease with heart failure: Secondary | ICD-10-CM | POA: Diagnosis not present

## 2017-11-09 DIAGNOSIS — L89152 Pressure ulcer of sacral region, stage 2: Secondary | ICD-10-CM | POA: Diagnosis not present

## 2017-11-09 DIAGNOSIS — T84223D Displacement of internal fixation device of bones of foot and toes, subsequent encounter: Secondary | ICD-10-CM | POA: Diagnosis not present

## 2017-11-09 DIAGNOSIS — I5042 Chronic combined systolic (congestive) and diastolic (congestive) heart failure: Secondary | ICD-10-CM | POA: Diagnosis not present

## 2017-11-15 DIAGNOSIS — T84223D Displacement of internal fixation device of bones of foot and toes, subsequent encounter: Secondary | ICD-10-CM | POA: Diagnosis not present

## 2017-11-15 DIAGNOSIS — L89613 Pressure ulcer of right heel, stage 3: Secondary | ICD-10-CM | POA: Diagnosis not present

## 2017-11-15 DIAGNOSIS — J9611 Chronic respiratory failure with hypoxia: Secondary | ICD-10-CM | POA: Diagnosis not present

## 2017-11-15 DIAGNOSIS — I5042 Chronic combined systolic (congestive) and diastolic (congestive) heart failure: Secondary | ICD-10-CM | POA: Diagnosis not present

## 2017-11-15 DIAGNOSIS — I110 Hypertensive heart disease with heart failure: Secondary | ICD-10-CM | POA: Diagnosis not present

## 2017-11-15 DIAGNOSIS — L89152 Pressure ulcer of sacral region, stage 2: Secondary | ICD-10-CM | POA: Diagnosis not present

## 2017-12-06 DIAGNOSIS — Z79899 Other long term (current) drug therapy: Secondary | ICD-10-CM | POA: Diagnosis not present

## 2017-12-08 DIAGNOSIS — L89613 Pressure ulcer of right heel, stage 3: Secondary | ICD-10-CM | POA: Diagnosis not present

## 2017-12-08 DIAGNOSIS — T84223D Displacement of internal fixation device of bones of foot and toes, subsequent encounter: Secondary | ICD-10-CM | POA: Diagnosis not present

## 2017-12-08 DIAGNOSIS — I5042 Chronic combined systolic (congestive) and diastolic (congestive) heart failure: Secondary | ICD-10-CM | POA: Diagnosis not present

## 2017-12-08 DIAGNOSIS — L89152 Pressure ulcer of sacral region, stage 2: Secondary | ICD-10-CM | POA: Diagnosis not present

## 2017-12-08 DIAGNOSIS — J9611 Chronic respiratory failure with hypoxia: Secondary | ICD-10-CM | POA: Diagnosis not present

## 2017-12-08 DIAGNOSIS — I110 Hypertensive heart disease with heart failure: Secondary | ICD-10-CM | POA: Diagnosis not present

## 2017-12-09 IMAGING — CR DG KNEE COMPLETE 4+V*R*
4 series · 4 of 4 positions shown · non-contrast
Comparison: None.

CLINICAL DATA: Right anterior knee pain and swelling after a fall.

EXAM:
RIGHT KNEE - COMPLETE 4+ VIEW

[x knee ap right (1 of 2)]
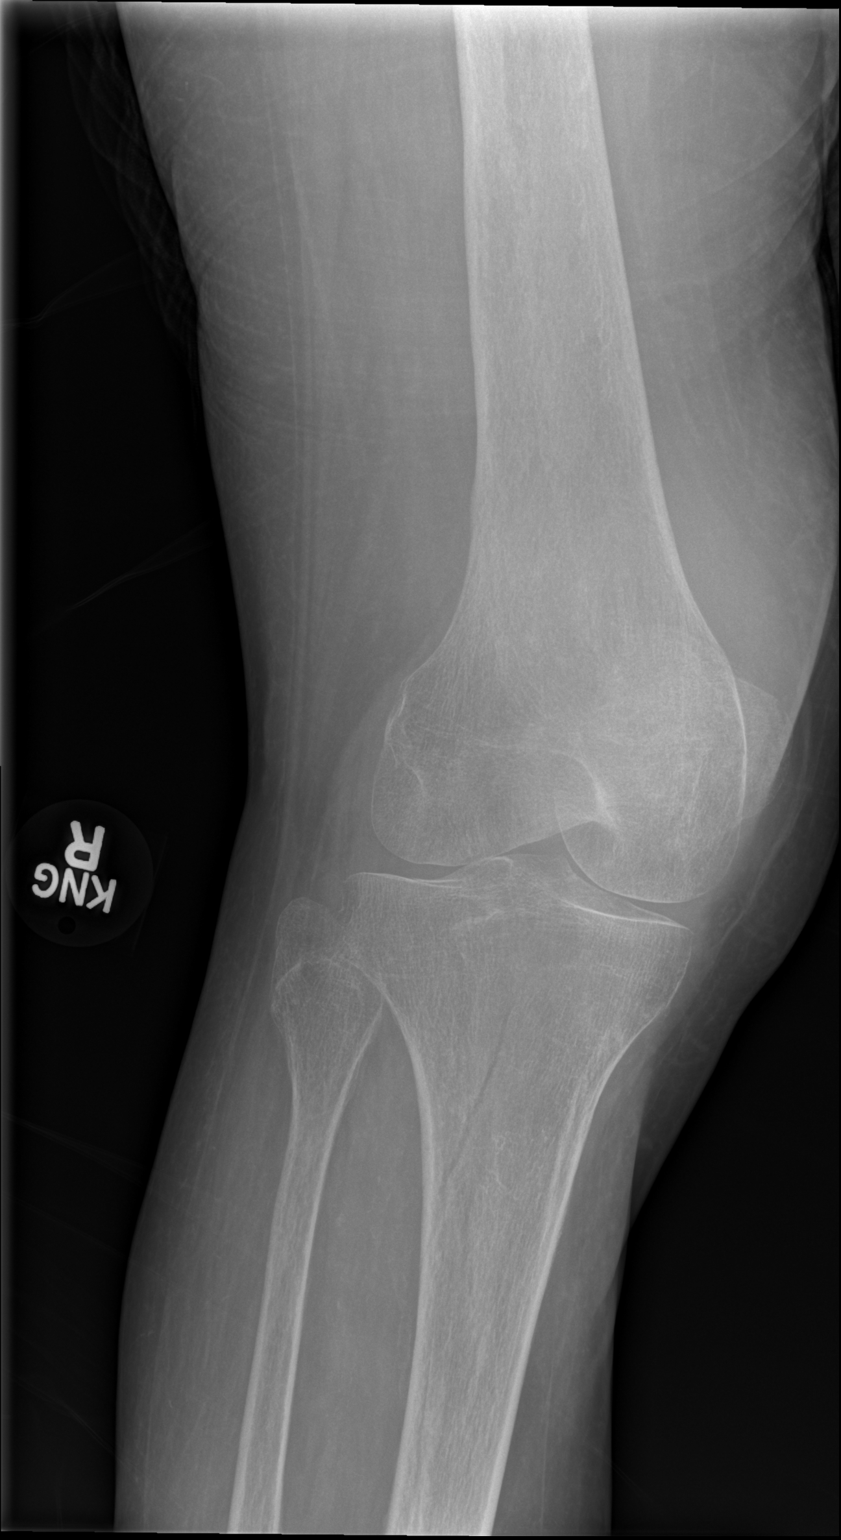

[x knee ap right (2 of 2)]
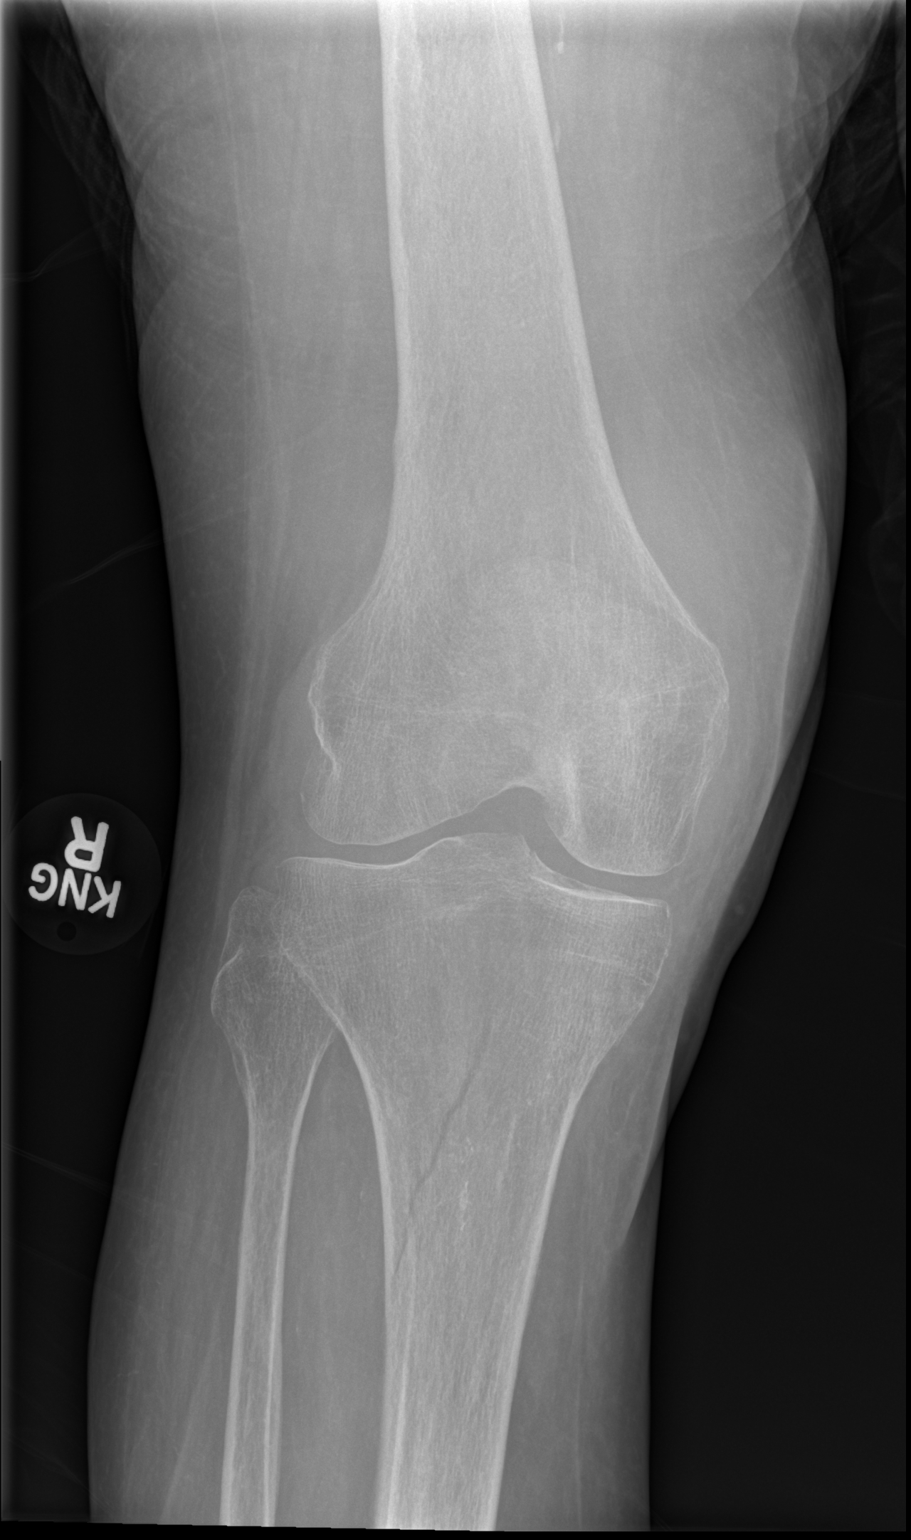

[x knee obl right]
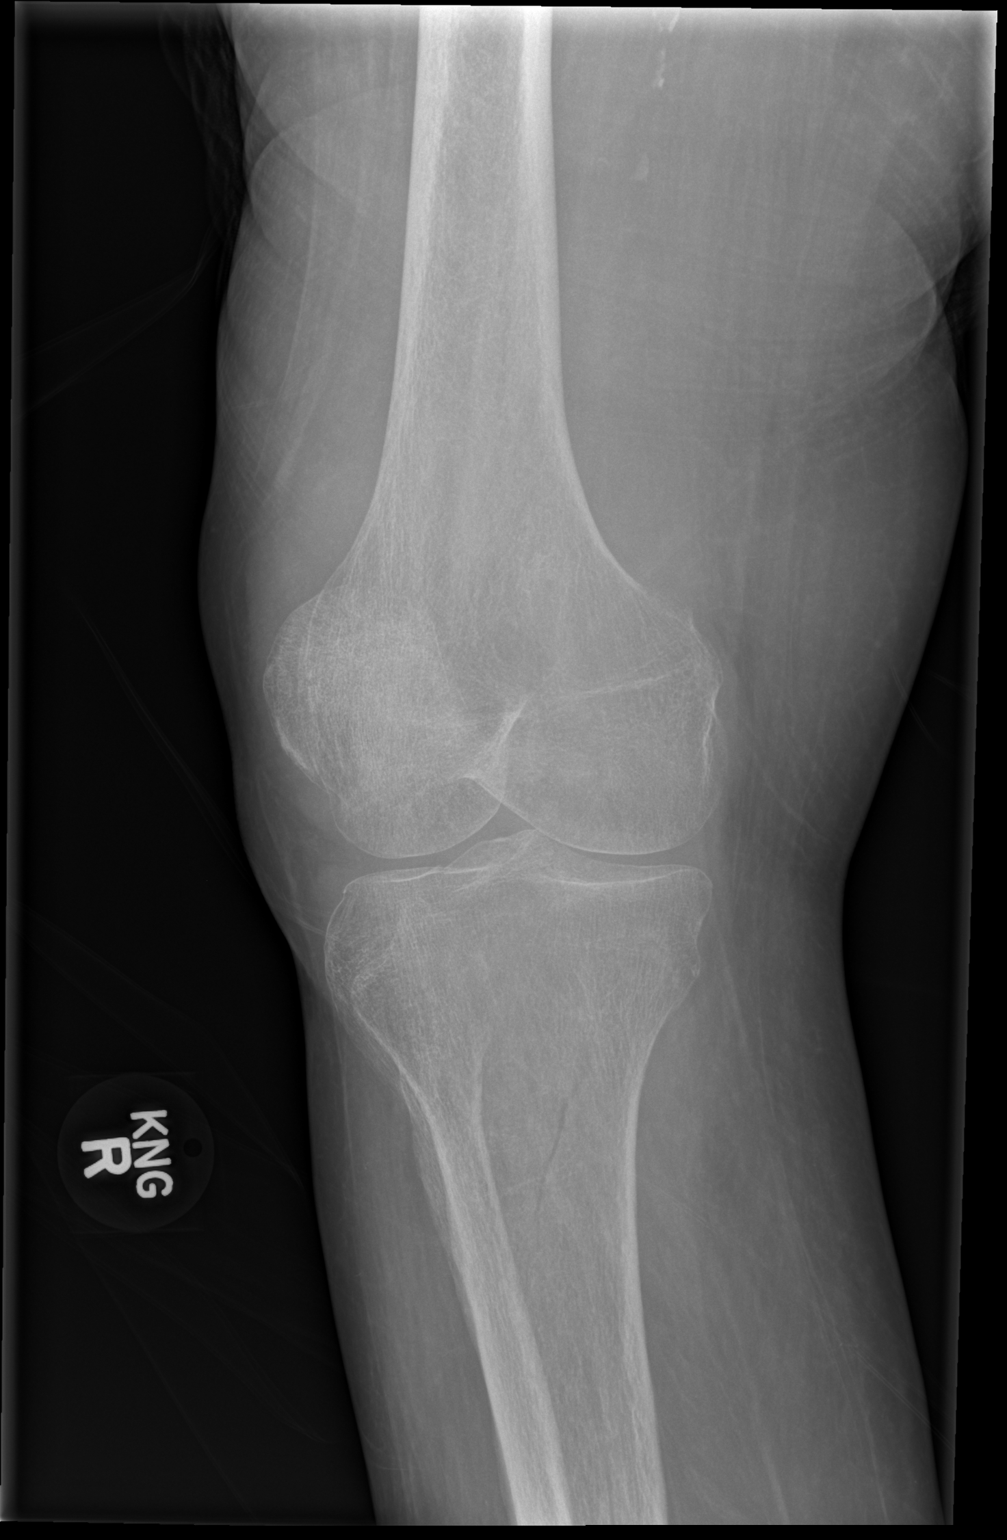

[x knee lat right]
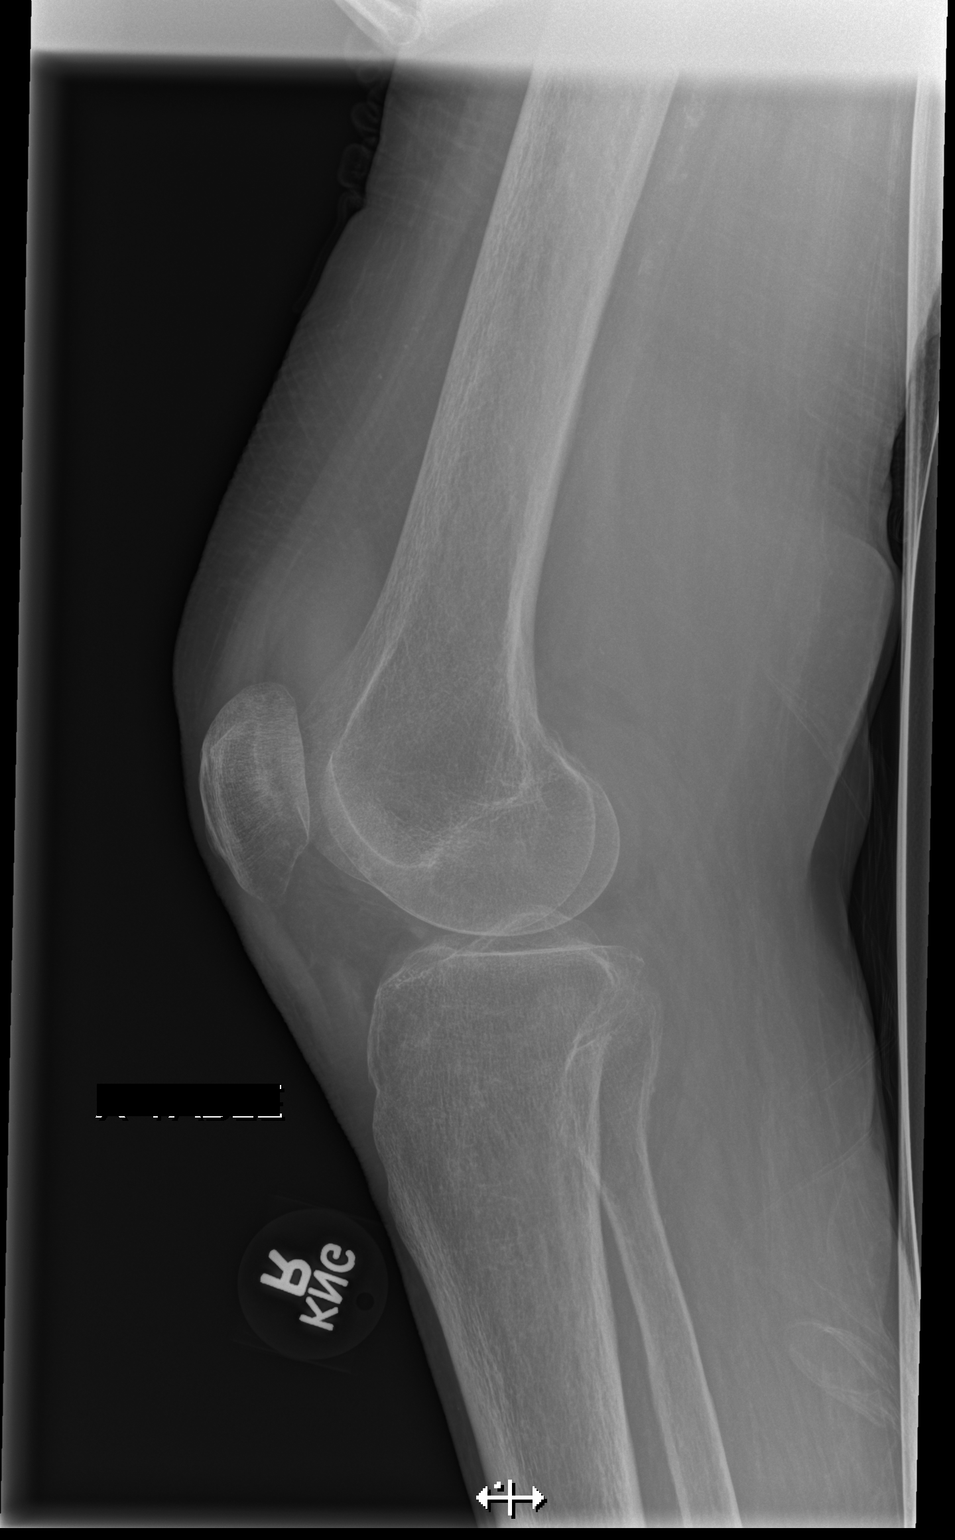

[4 of 4 positions shown; findings below may reference images not displayed]

FINDINGS: There is an oblique fracture of the right tibial metaphysis with
fracture line beginning at the lateral margin of the distal aspect
of the tibial metaphysis and extending in an oblique fraction
through the tibial metaphysis superiorly and to the central aspect
of the medial tibial plateau at the base of the tibial spine. There
is no significant displacement of the fracture fragments. There is a
moderate size right knee effusion. The distal femur, patella, and
fibula appear intact.
IMPRESSION: Nondisplaced acute oblique linear fracture through the right
proximal tibial metaphysis and extending to the medial tibial
plateau at at the tibial spine.

## 2017-12-15 DIAGNOSIS — I110 Hypertensive heart disease with heart failure: Secondary | ICD-10-CM | POA: Diagnosis not present

## 2017-12-15 DIAGNOSIS — T84223D Displacement of internal fixation device of bones of foot and toes, subsequent encounter: Secondary | ICD-10-CM | POA: Diagnosis not present

## 2017-12-15 DIAGNOSIS — J9611 Chronic respiratory failure with hypoxia: Secondary | ICD-10-CM | POA: Diagnosis not present

## 2017-12-15 DIAGNOSIS — I5042 Chronic combined systolic (congestive) and diastolic (congestive) heart failure: Secondary | ICD-10-CM | POA: Diagnosis not present

## 2017-12-15 DIAGNOSIS — L89613 Pressure ulcer of right heel, stage 3: Secondary | ICD-10-CM | POA: Diagnosis not present

## 2017-12-15 DIAGNOSIS — L89152 Pressure ulcer of sacral region, stage 2: Secondary | ICD-10-CM | POA: Diagnosis not present

## 2017-12-22 DIAGNOSIS — J9611 Chronic respiratory failure with hypoxia: Secondary | ICD-10-CM | POA: Diagnosis not present

## 2017-12-22 DIAGNOSIS — T84223D Displacement of internal fixation device of bones of foot and toes, subsequent encounter: Secondary | ICD-10-CM | POA: Diagnosis not present

## 2017-12-22 DIAGNOSIS — L89152 Pressure ulcer of sacral region, stage 2: Secondary | ICD-10-CM | POA: Diagnosis not present

## 2017-12-22 DIAGNOSIS — I5042 Chronic combined systolic (congestive) and diastolic (congestive) heart failure: Secondary | ICD-10-CM | POA: Diagnosis not present

## 2017-12-22 DIAGNOSIS — I110 Hypertensive heart disease with heart failure: Secondary | ICD-10-CM | POA: Diagnosis not present

## 2017-12-22 DIAGNOSIS — L89613 Pressure ulcer of right heel, stage 3: Secondary | ICD-10-CM | POA: Diagnosis not present

## 2017-12-31 DIAGNOSIS — I5042 Chronic combined systolic (congestive) and diastolic (congestive) heart failure: Secondary | ICD-10-CM | POA: Diagnosis not present

## 2017-12-31 DIAGNOSIS — T84223D Displacement of internal fixation device of bones of foot and toes, subsequent encounter: Secondary | ICD-10-CM | POA: Diagnosis not present

## 2017-12-31 DIAGNOSIS — L89613 Pressure ulcer of right heel, stage 3: Secondary | ICD-10-CM | POA: Diagnosis not present

## 2017-12-31 DIAGNOSIS — J9611 Chronic respiratory failure with hypoxia: Secondary | ICD-10-CM | POA: Diagnosis not present

## 2017-12-31 DIAGNOSIS — I110 Hypertensive heart disease with heart failure: Secondary | ICD-10-CM | POA: Diagnosis not present

## 2017-12-31 DIAGNOSIS — L89152 Pressure ulcer of sacral region, stage 2: Secondary | ICD-10-CM | POA: Diagnosis not present

## 2018-01-06 DIAGNOSIS — I5042 Chronic combined systolic (congestive) and diastolic (congestive) heart failure: Secondary | ICD-10-CM | POA: Diagnosis not present

## 2018-01-06 DIAGNOSIS — J9611 Chronic respiratory failure with hypoxia: Secondary | ICD-10-CM | POA: Diagnosis not present

## 2018-01-06 DIAGNOSIS — I110 Hypertensive heart disease with heart failure: Secondary | ICD-10-CM | POA: Diagnosis not present

## 2018-01-06 DIAGNOSIS — L89152 Pressure ulcer of sacral region, stage 2: Secondary | ICD-10-CM | POA: Diagnosis not present

## 2018-01-06 DIAGNOSIS — T84223D Displacement of internal fixation device of bones of foot and toes, subsequent encounter: Secondary | ICD-10-CM | POA: Diagnosis not present

## 2018-01-06 DIAGNOSIS — L89613 Pressure ulcer of right heel, stage 3: Secondary | ICD-10-CM | POA: Diagnosis not present

## 2018-01-08 DIAGNOSIS — I110 Hypertensive heart disease with heart failure: Secondary | ICD-10-CM | POA: Diagnosis not present

## 2018-01-08 DIAGNOSIS — L89152 Pressure ulcer of sacral region, stage 2: Secondary | ICD-10-CM | POA: Diagnosis not present

## 2018-01-08 DIAGNOSIS — L89613 Pressure ulcer of right heel, stage 3: Secondary | ICD-10-CM | POA: Diagnosis not present

## 2018-01-08 DIAGNOSIS — L89510 Pressure ulcer of right ankle, unstageable: Secondary | ICD-10-CM | POA: Diagnosis not present

## 2018-01-08 DIAGNOSIS — J441 Chronic obstructive pulmonary disease with (acute) exacerbation: Secondary | ICD-10-CM | POA: Diagnosis not present

## 2018-01-08 DIAGNOSIS — I5042 Chronic combined systolic (congestive) and diastolic (congestive) heart failure: Secondary | ICD-10-CM | POA: Diagnosis not present

## 2018-01-13 DIAGNOSIS — L89510 Pressure ulcer of right ankle, unstageable: Secondary | ICD-10-CM | POA: Diagnosis not present

## 2018-01-13 DIAGNOSIS — I110 Hypertensive heart disease with heart failure: Secondary | ICD-10-CM | POA: Diagnosis not present

## 2018-01-13 DIAGNOSIS — J441 Chronic obstructive pulmonary disease with (acute) exacerbation: Secondary | ICD-10-CM | POA: Diagnosis not present

## 2018-01-13 DIAGNOSIS — L89613 Pressure ulcer of right heel, stage 3: Secondary | ICD-10-CM | POA: Diagnosis not present

## 2018-01-13 DIAGNOSIS — L89152 Pressure ulcer of sacral region, stage 2: Secondary | ICD-10-CM | POA: Diagnosis not present

## 2018-01-13 DIAGNOSIS — I5042 Chronic combined systolic (congestive) and diastolic (congestive) heart failure: Secondary | ICD-10-CM | POA: Diagnosis not present

## 2018-01-15 DIAGNOSIS — L089 Local infection of the skin and subcutaneous tissue, unspecified: Secondary | ICD-10-CM

## 2018-01-15 DIAGNOSIS — L98429 Non-pressure chronic ulcer of back with unspecified severity: Secondary | ICD-10-CM

## 2018-01-15 DIAGNOSIS — T148XXA Other injury of unspecified body region, initial encounter: Secondary | ICD-10-CM

## 2018-01-15 HISTORY — DX: Local infection of the skin and subcutaneous tissue, unspecified: L08.9

## 2018-01-15 HISTORY — DX: Non-pressure chronic ulcer of back with unspecified severity: L98.429

## 2018-01-15 HISTORY — DX: Other injury of unspecified body region, initial encounter: T14.8XXA

## 2018-01-20 DIAGNOSIS — I110 Hypertensive heart disease with heart failure: Secondary | ICD-10-CM | POA: Diagnosis not present

## 2018-01-20 DIAGNOSIS — L89152 Pressure ulcer of sacral region, stage 2: Secondary | ICD-10-CM | POA: Diagnosis not present

## 2018-01-20 DIAGNOSIS — J441 Chronic obstructive pulmonary disease with (acute) exacerbation: Secondary | ICD-10-CM | POA: Diagnosis not present

## 2018-01-20 DIAGNOSIS — L89613 Pressure ulcer of right heel, stage 3: Secondary | ICD-10-CM | POA: Diagnosis not present

## 2018-01-20 DIAGNOSIS — I5042 Chronic combined systolic (congestive) and diastolic (congestive) heart failure: Secondary | ICD-10-CM | POA: Diagnosis not present

## 2018-01-20 DIAGNOSIS — L89510 Pressure ulcer of right ankle, unstageable: Secondary | ICD-10-CM | POA: Diagnosis not present

## 2018-01-22 DIAGNOSIS — Z136 Encounter for screening for cardiovascular disorders: Secondary | ICD-10-CM | POA: Diagnosis not present

## 2018-01-22 DIAGNOSIS — L8990 Pressure ulcer of unspecified site, unspecified stage: Secondary | ICD-10-CM | POA: Diagnosis not present

## 2018-01-22 DIAGNOSIS — Z7901 Long term (current) use of anticoagulants: Secondary | ICD-10-CM | POA: Diagnosis not present

## 2018-01-22 DIAGNOSIS — I4891 Unspecified atrial fibrillation: Secondary | ICD-10-CM | POA: Diagnosis not present

## 2018-01-22 DIAGNOSIS — G894 Chronic pain syndrome: Secondary | ICD-10-CM | POA: Diagnosis not present

## 2018-01-22 DIAGNOSIS — Z0001 Encounter for general adult medical examination with abnormal findings: Secondary | ICD-10-CM | POA: Diagnosis not present

## 2018-01-22 DIAGNOSIS — E039 Hypothyroidism, unspecified: Secondary | ICD-10-CM | POA: Diagnosis not present

## 2018-01-22 DIAGNOSIS — Z1231 Encounter for screening mammogram for malignant neoplasm of breast: Secondary | ICD-10-CM | POA: Diagnosis not present

## 2018-01-28 DIAGNOSIS — J441 Chronic obstructive pulmonary disease with (acute) exacerbation: Secondary | ICD-10-CM | POA: Diagnosis not present

## 2018-01-28 DIAGNOSIS — L89613 Pressure ulcer of right heel, stage 3: Secondary | ICD-10-CM | POA: Diagnosis not present

## 2018-01-28 DIAGNOSIS — I739 Peripheral vascular disease, unspecified: Secondary | ICD-10-CM | POA: Diagnosis not present

## 2018-01-28 DIAGNOSIS — L89152 Pressure ulcer of sacral region, stage 2: Secondary | ICD-10-CM | POA: Diagnosis not present

## 2018-01-28 DIAGNOSIS — I5042 Chronic combined systolic (congestive) and diastolic (congestive) heart failure: Secondary | ICD-10-CM | POA: Diagnosis not present

## 2018-01-28 DIAGNOSIS — L89510 Pressure ulcer of right ankle, unstageable: Secondary | ICD-10-CM | POA: Diagnosis not present

## 2018-01-28 DIAGNOSIS — L97513 Non-pressure chronic ulcer of other part of right foot with necrosis of muscle: Secondary | ICD-10-CM | POA: Diagnosis not present

## 2018-01-28 DIAGNOSIS — I110 Hypertensive heart disease with heart failure: Secondary | ICD-10-CM | POA: Diagnosis not present

## 2018-01-29 DIAGNOSIS — I110 Hypertensive heart disease with heart failure: Secondary | ICD-10-CM | POA: Diagnosis not present

## 2018-01-29 DIAGNOSIS — L89510 Pressure ulcer of right ankle, unstageable: Secondary | ICD-10-CM | POA: Diagnosis not present

## 2018-01-29 DIAGNOSIS — L89152 Pressure ulcer of sacral region, stage 2: Secondary | ICD-10-CM | POA: Diagnosis not present

## 2018-01-29 DIAGNOSIS — L89613 Pressure ulcer of right heel, stage 3: Secondary | ICD-10-CM | POA: Diagnosis not present

## 2018-01-29 DIAGNOSIS — I4891 Unspecified atrial fibrillation: Secondary | ICD-10-CM | POA: Diagnosis not present

## 2018-01-29 DIAGNOSIS — J441 Chronic obstructive pulmonary disease with (acute) exacerbation: Secondary | ICD-10-CM | POA: Diagnosis not present

## 2018-01-29 DIAGNOSIS — I5042 Chronic combined systolic (congestive) and diastolic (congestive) heart failure: Secondary | ICD-10-CM | POA: Diagnosis not present

## 2018-02-01 DIAGNOSIS — I4891 Unspecified atrial fibrillation: Secondary | ICD-10-CM | POA: Diagnosis not present

## 2018-02-01 DIAGNOSIS — J449 Chronic obstructive pulmonary disease, unspecified: Secondary | ICD-10-CM | POA: Diagnosis not present

## 2018-02-02 ENCOUNTER — Other Ambulatory Visit: Payer: Self-pay

## 2018-02-02 ENCOUNTER — Emergency Department (HOSPITAL_COMMUNITY): Payer: Medicare Other

## 2018-02-02 ENCOUNTER — Inpatient Hospital Stay (HOSPITAL_COMMUNITY)
Admission: EM | Admit: 2018-02-02 | Discharge: 2018-02-11 | DRG: 592 | Disposition: A | Discharge status: Home / Self Care | Payer: Medicare Other | Attending: Family Medicine | Admitting: Family Medicine

## 2018-02-02 ENCOUNTER — Encounter (HOSPITAL_COMMUNITY): Payer: Self-pay

## 2018-02-02 DIAGNOSIS — E039 Hypothyroidism, unspecified: Secondary | ICD-10-CM | POA: Diagnosis present

## 2018-02-02 DIAGNOSIS — Z79899 Other long term (current) drug therapy: Secondary | ICD-10-CM | POA: Diagnosis not present

## 2018-02-02 DIAGNOSIS — I5023 Acute on chronic systolic (congestive) heart failure: Secondary | ICD-10-CM | POA: Diagnosis present

## 2018-02-02 DIAGNOSIS — I110 Hypertensive heart disease with heart failure: Secondary | ICD-10-CM | POA: Diagnosis present

## 2018-02-02 DIAGNOSIS — Z515 Encounter for palliative care: Secondary | ICD-10-CM | POA: Diagnosis not present

## 2018-02-02 DIAGNOSIS — I4821 Permanent atrial fibrillation: Secondary | ICD-10-CM

## 2018-02-02 DIAGNOSIS — R4182 Altered mental status, unspecified: Secondary | ICD-10-CM | POA: Diagnosis not present

## 2018-02-02 DIAGNOSIS — T847XXA Infection and inflammatory reaction due to other internal orthopedic prosthetic devices, implants and grafts, initial encounter: Secondary | ICD-10-CM | POA: Diagnosis present

## 2018-02-02 DIAGNOSIS — Z967 Presence of other bone and tendon implants: Secondary | ICD-10-CM | POA: Diagnosis not present

## 2018-02-02 DIAGNOSIS — Z9689 Presence of other specified functional implants: Secondary | ICD-10-CM | POA: Diagnosis not present

## 2018-02-02 DIAGNOSIS — Z79890 Hormone replacement therapy (postmenopausal): Secondary | ICD-10-CM | POA: Diagnosis not present

## 2018-02-02 DIAGNOSIS — Z90710 Acquired absence of both cervix and uterus: Secondary | ICD-10-CM | POA: Diagnosis not present

## 2018-02-02 DIAGNOSIS — L97316 Non-pressure chronic ulcer of right ankle with bone involvement without evidence of necrosis: Secondary | ICD-10-CM | POA: Diagnosis not present

## 2018-02-02 DIAGNOSIS — Y793 Surgical instruments, materials and orthopedic devices (including sutures) associated with adverse incidents: Secondary | ICD-10-CM | POA: Diagnosis not present

## 2018-02-02 DIAGNOSIS — M79671 Pain in right foot: Secondary | ICD-10-CM | POA: Diagnosis not present

## 2018-02-02 DIAGNOSIS — D539 Nutritional anemia, unspecified: Secondary | ICD-10-CM | POA: Diagnosis present

## 2018-02-02 DIAGNOSIS — Z9981 Dependence on supplemental oxygen: Secondary | ICD-10-CM | POA: Diagnosis not present

## 2018-02-02 DIAGNOSIS — G9341 Metabolic encephalopathy: Secondary | ICD-10-CM | POA: Diagnosis present

## 2018-02-02 DIAGNOSIS — I482 Chronic atrial fibrillation: Secondary | ICD-10-CM | POA: Diagnosis present

## 2018-02-02 DIAGNOSIS — M2550 Pain in unspecified joint: Secondary | ICD-10-CM | POA: Diagnosis not present

## 2018-02-02 DIAGNOSIS — I4891 Unspecified atrial fibrillation: Secondary | ICD-10-CM | POA: Diagnosis not present

## 2018-02-02 DIAGNOSIS — Z8619 Personal history of other infectious and parasitic diseases: Secondary | ICD-10-CM | POA: Diagnosis not present

## 2018-02-02 DIAGNOSIS — I481 Persistent atrial fibrillation: Secondary | ICD-10-CM | POA: Diagnosis not present

## 2018-02-02 DIAGNOSIS — J449 Chronic obstructive pulmonary disease, unspecified: Secondary | ICD-10-CM | POA: Diagnosis present

## 2018-02-02 DIAGNOSIS — L97319 Non-pressure chronic ulcer of right ankle with unspecified severity: Principal | ICD-10-CM | POA: Diagnosis present

## 2018-02-02 DIAGNOSIS — X58XXXS Exposure to other specified factors, sequela: Secondary | ICD-10-CM | POA: Diagnosis not present

## 2018-02-02 DIAGNOSIS — Z8781 Personal history of (healed) traumatic fracture: Secondary | ICD-10-CM | POA: Diagnosis not present

## 2018-02-02 DIAGNOSIS — E875 Hyperkalemia: Secondary | ICD-10-CM | POA: Diagnosis present

## 2018-02-02 DIAGNOSIS — R05 Cough: Secondary | ICD-10-CM | POA: Diagnosis not present

## 2018-02-02 DIAGNOSIS — T8469XA Infection and inflammatory reaction due to internal fixation device of other site, initial encounter: Secondary | ICD-10-CM | POA: Diagnosis not present

## 2018-02-02 DIAGNOSIS — I480 Paroxysmal atrial fibrillation: Secondary | ICD-10-CM | POA: Diagnosis not present

## 2018-02-02 DIAGNOSIS — J9611 Chronic respiratory failure with hypoxia: Secondary | ICD-10-CM | POA: Diagnosis not present

## 2018-02-02 DIAGNOSIS — T847XXD Infection and inflammatory reaction due to other internal orthopedic prosthetic devices, implants and grafts, subsequent encounter: Secondary | ICD-10-CM | POA: Diagnosis not present

## 2018-02-02 DIAGNOSIS — R0602 Shortness of breath: Secondary | ICD-10-CM | POA: Diagnosis not present

## 2018-02-02 DIAGNOSIS — S82891S Other fracture of right lower leg, sequela: Secondary | ICD-10-CM | POA: Diagnosis not present

## 2018-02-02 DIAGNOSIS — R1313 Dysphagia, pharyngeal phase: Secondary | ICD-10-CM | POA: Diagnosis present

## 2018-02-02 DIAGNOSIS — F419 Anxiety disorder, unspecified: Secondary | ICD-10-CM | POA: Diagnosis not present

## 2018-02-02 DIAGNOSIS — Z7401 Bed confinement status: Secondary | ICD-10-CM | POA: Diagnosis not present

## 2018-02-02 DIAGNOSIS — Z7189 Other specified counseling: Secondary | ICD-10-CM | POA: Diagnosis not present

## 2018-02-02 DIAGNOSIS — D638 Anemia in other chronic diseases classified elsewhere: Secondary | ICD-10-CM | POA: Diagnosis present

## 2018-02-02 DIAGNOSIS — D649 Anemia, unspecified: Secondary | ICD-10-CM | POA: Diagnosis not present

## 2018-02-02 DIAGNOSIS — I5022 Chronic systolic (congestive) heart failure: Secondary | ICD-10-CM | POA: Diagnosis not present

## 2018-02-02 DIAGNOSIS — R451 Restlessness and agitation: Secondary | ICD-10-CM | POA: Diagnosis present

## 2018-02-02 DIAGNOSIS — I959 Hypotension, unspecified: Secondary | ICD-10-CM | POA: Diagnosis present

## 2018-02-02 DIAGNOSIS — Z7901 Long term (current) use of anticoagulants: Secondary | ICD-10-CM | POA: Diagnosis not present

## 2018-02-02 DIAGNOSIS — L89151 Pressure ulcer of sacral region, stage 1: Secondary | ICD-10-CM | POA: Diagnosis present

## 2018-02-02 DIAGNOSIS — T8579XA Infection and inflammatory reaction due to other internal prosthetic devices, implants and grafts, initial encounter: Secondary | ICD-10-CM | POA: Insufficient documentation

## 2018-02-02 HISTORY — DX: Other injury of unspecified body region, initial encounter: T14.8XXA

## 2018-02-02 HISTORY — DX: Local infection of the skin and subcutaneous tissue, unspecified: L08.9

## 2018-02-02 HISTORY — DX: Non-pressure chronic ulcer of back with unspecified severity: L98.429

## 2018-02-02 LAB — BASIC METABOLIC PANEL
Anion gap: 7 (ref 5–15)
BUN: 10 mg/dL (ref 6–20)
CHLORIDE: 106 mmol/L (ref 101–111)
CO2: 27 mmol/L (ref 22–32)
CREATININE: 0.87 mg/dL (ref 0.44–1.00)
Calcium: 8.7 mg/dL — ABNORMAL LOW (ref 8.9–10.3)
GFR calc Af Amer: >60 mL/min (ref >60)
GFR calc non Af Amer: 57 mL/min — ABNORMAL LOW (ref >60)
GLUCOSE: 90 mg/dL (ref 65–99)
POTASSIUM: 4.9 mmol/L (ref 3.5–5.1)
SODIUM: 140 mmol/L (ref 135–145)

## 2018-02-02 LAB — CBC WITH DIFFERENTIAL/PLATELET
Abs Immature Granulocytes: 0 10*3/uL (ref 0.0–0.1)
Basophils Absolute: 0 10*3/uL (ref 0.0–0.1)
Basophils Relative: 1 %
EOS ABS: 0.2 10*3/uL (ref 0.0–0.7)
Eosinophils Relative: 3 %
HCT: 28.3 % — ABNORMAL LOW (ref 36.0–46.0)
HEMOGLOBIN: 8.8 g/dL — AB (ref 12.0–15.0)
Immature Granulocytes: 1 %
LYMPHS ABS: 0.8 10*3/uL (ref 0.7–4.0)
LYMPHS PCT: 13 %
MCH: 31.2 pg (ref 26.0–34.0)
MCHC: 31.1 g/dL (ref 30.0–36.0)
MCV: 100.4 fL — AB (ref 78.0–100.0)
MONO ABS: 0.5 10*3/uL (ref 0.1–1.0)
MONOS PCT: 9 %
Neutro Abs: 4.3 10*3/uL (ref 1.7–7.7)
Neutrophils Relative %: 73 %
Platelets: 311 10*3/uL (ref 150–400)
RBC: 2.82 MIL/uL — ABNORMAL LOW (ref 3.87–5.11)
RDW: 17.4 % — AB (ref 11.5–15.5)
WBC: 5.9 10*3/uL (ref 4.0–10.5)

## 2018-02-02 LAB — URINALYSIS, ROUTINE W REFLEX MICROSCOPIC
Bilirubin Urine: NEGATIVE
Glucose, UA: NEGATIVE mg/dL
Hgb urine dipstick: NEGATIVE
Ketones, ur: NEGATIVE mg/dL
Leukocytes, UA: NEGATIVE
NITRITE: NEGATIVE
PROTEIN: NEGATIVE mg/dL
SPECIFIC GRAVITY, URINE: 1.009 (ref 1.005–1.030)
pH: 5 (ref 5.0–8.0)

## 2018-02-02 LAB — I-STAT CG4 LACTIC ACID, ED
Lactic Acid, Venous: 2.01 mmol/L (ref 0.5–1.9)
Lactic Acid, Venous: 1.28 mmol/L (ref 0.5–1.9)

## 2018-02-02 LAB — PROTIME-INR
INR: 1.73
PROTHROMBIN TIME: 20.1 s — AB (ref 11.4–15.2)

## 2018-02-02 LAB — BRAIN NATRIURETIC PEPTIDE: B Natriuretic Peptide: 1003 pg/mL — ABNORMAL HIGH (ref 0.0–100.0)

## 2018-02-02 LAB — SEDIMENTATION RATE: SED RATE: 40 mm/h — AB (ref 0–22)

## 2018-02-02 LAB — I-STAT TROPONIN, ED: Troponin i, poc: 0 ng/mL (ref 0.00–0.08)

## 2018-02-02 LAB — C-REACTIVE PROTEIN: CRP: 1.5 mg/dL — AB (ref <1.0)

## 2018-02-02 MED ORDER — SENNOSIDES-DOCUSATE SODIUM 8.6-50 MG PO TABS: 1.0000 | ORAL_TABLET | Freq: Every evening | ORAL | Status: DC | PRN

## 2018-02-02 MED ORDER — SODIUM CHLORIDE 0.9% FLUSH: 3.0000 mL | INTRAVENOUS | Status: DC | PRN

## 2018-02-02 MED ORDER — GUAIFENESIN ER 600 MG PO TB12: 600.0000 mg | ORAL_TABLET | Freq: Two times a day (BID) | ORAL | Status: DC | PRN

## 2018-02-02 MED ORDER — FLUOXETINE HCL 20 MG PO CAPS
40.0000 mg | ORAL_CAPSULE | Freq: Every day | ORAL | Status: DC
  Administered 2018-02-03 – 2018-02-09 (×7): 40 mg via ORAL
  Filled 2018-02-02 (×7): qty 2

## 2018-02-02 MED ORDER — METHOCARBAMOL 500 MG PO TABS
500.0000 mg | ORAL_TABLET | Freq: Two times a day (BID) | ORAL | Status: DC
  Administered 2018-02-03 – 2018-02-11 (×17): 500 mg via ORAL
  Filled 2018-02-02 (×17): qty 1

## 2018-02-02 MED ORDER — BISACODYL 5 MG PO TBEC: 5.0000 mg | DELAYED_RELEASE_TABLET | Freq: Every day | ORAL | Status: DC | PRN

## 2018-02-02 MED ORDER — SODIUM CHLORIDE 0.9 % IV BOLUS
500.0000 mL | Freq: Once | INTRAVENOUS | Status: AC
  Administered 2018-02-02: 500 mL via INTRAVENOUS

## 2018-02-02 MED ORDER — ACETAMINOPHEN 325 MG PO TABS: 650.0000 mg | ORAL_TABLET | Freq: Four times a day (QID) | ORAL | Status: DC | PRN

## 2018-02-02 MED ORDER — ROPINIROLE HCL 1 MG PO TABS
1.0000 mg | ORAL_TABLET | Freq: Every evening | ORAL | Status: DC
  Administered 2018-02-03 – 2018-02-08 (×6): 1 mg via ORAL
  Filled 2018-02-02 (×6): qty 1

## 2018-02-02 MED ORDER — WARFARIN SODIUM 2 MG PO TABS
2.0000 mg | ORAL_TABLET | Freq: Once | ORAL | Status: DC
  Filled 2018-02-02: qty 1

## 2018-02-02 MED ORDER — SODIUM CHLORIDE 0.9 % IV BOLUS: 1000.0000 mL | Freq: Once | INTRAVENOUS | Status: DC

## 2018-02-02 MED ORDER — ALBUTEROL SULFATE (2.5 MG/3ML) 0.083% IN NEBU: 2.5000 mg | INHALATION_SOLUTION | Freq: Four times a day (QID) | RESPIRATORY_TRACT | Status: DC | PRN

## 2018-02-02 MED ORDER — WARFARIN - PHARMACIST DOSING INPATIENT
Freq: Every day | Status: DC
  Administered 2018-02-03 – 2018-02-10 (×5)

## 2018-02-02 MED ORDER — VANCOMYCIN HCL 500 MG IV SOLR
500.0000 mg | INTRAVENOUS | Status: DC
  Administered 2018-02-04 (×2): 500 mg via INTRAVENOUS
  Filled 2018-02-02 (×2): qty 500

## 2018-02-02 MED ORDER — SODIUM CHLORIDE 0.9% FLUSH
3.0000 mL | Freq: Two times a day (BID) | INTRAVENOUS | Status: DC
  Administered 2018-02-03 – 2018-02-10 (×4): 3 mL via INTRAVENOUS

## 2018-02-02 MED ORDER — ACETAMINOPHEN 500 MG PO TABS
1000.0000 mg | ORAL_TABLET | Freq: Once | ORAL | Status: AC
  Administered 2018-02-02: 1000 mg via ORAL
  Filled 2018-02-02: qty 2

## 2018-02-02 MED ORDER — ONDANSETRON HCL 4 MG PO TABS: 4.0000 mg | ORAL_TABLET | Freq: Four times a day (QID) | ORAL | Status: DC | PRN

## 2018-02-02 MED ORDER — MIRTAZAPINE 15 MG PO TABS
7.5000 mg | ORAL_TABLET | Freq: Every evening | ORAL | Status: DC
  Administered 2018-02-03 – 2018-02-10 (×8): 7.5 mg via ORAL
  Filled 2018-02-02 (×8): qty 1

## 2018-02-02 MED ORDER — SODIUM CHLORIDE 0.9 % IV SOLN: 250.0000 mL | INTRAVENOUS | Status: DC | PRN

## 2018-02-02 MED ORDER — ACETAMINOPHEN 650 MG RE SUPP: 650.0000 mg | Freq: Four times a day (QID) | RECTAL | Status: DC | PRN

## 2018-02-02 MED ORDER — PANTOPRAZOLE SODIUM 40 MG PO TBEC
40.0000 mg | DELAYED_RELEASE_TABLET | Freq: Every day | ORAL | Status: DC
  Administered 2018-02-03 – 2018-02-11 (×9): 40 mg via ORAL
  Filled 2018-02-02 (×9): qty 1

## 2018-02-02 MED ORDER — VANCOMYCIN HCL IN DEXTROSE 1-5 GM/200ML-% IV SOLN
1000.0000 mg | Freq: Once | INTRAVENOUS | Status: AC
  Administered 2018-02-03: 1000 mg via INTRAVENOUS
  Filled 2018-02-02: qty 200

## 2018-02-02 MED ORDER — TRAVOPROST (BAK FREE) 0.004 % OP SOLN
1.0000 [drp] | Freq: Every day | OPHTHALMIC | Status: DC
  Administered 2018-02-02 – 2018-02-10 (×9): 1 [drp] via OPHTHALMIC
  Filled 2018-02-02: qty 2.5

## 2018-02-02 MED ORDER — TIMOLOL MALEATE 0.5 % OP SOLN
1.0000 [drp] | Freq: Two times a day (BID) | OPHTHALMIC | Status: DC
  Administered 2018-02-02 – 2018-02-11 (×18): 1 [drp] via OPHTHALMIC
  Filled 2018-02-02: qty 5

## 2018-02-02 MED ORDER — GABAPENTIN 600 MG PO TABS
600.0000 mg | ORAL_TABLET | Freq: Two times a day (BID) | ORAL | Status: DC
  Administered 2018-02-03 – 2018-02-09 (×13): 600 mg via ORAL
  Filled 2018-02-02 (×13): qty 1

## 2018-02-02 MED ORDER — SODIUM CHLORIDE 0.9% FLUSH
3.0000 mL | Freq: Two times a day (BID) | INTRAVENOUS | Status: DC
  Administered 2018-02-02 – 2018-02-11 (×17): 3 mL via INTRAVENOUS

## 2018-02-02 MED ORDER — SODIUM CHLORIDE 0.9 % IV SOLN
1.0000 g | INTRAVENOUS | Status: DC
  Administered 2018-02-03 – 2018-02-04 (×2): 1 g via INTRAVENOUS
  Filled 2018-02-02 (×2): qty 1

## 2018-02-02 MED ORDER — PILOCARPINE HCL 4 % OP SOLN
1.0000 [drp] | Freq: Two times a day (BID) | OPHTHALMIC | Status: DC
  Administered 2018-02-02 – 2018-02-11 (×18): 1 [drp] via OPHTHALMIC
  Filled 2018-02-02: qty 15

## 2018-02-02 MED ORDER — HALOPERIDOL LACTATE 5 MG/ML IJ SOLN
1.0000 mg | Freq: Once | INTRAMUSCULAR | Status: AC
  Administered 2018-02-02: 1 mg via INTRAVENOUS
  Filled 2018-02-02: qty 1

## 2018-02-02 MED ORDER — LEVOTHYROXINE SODIUM 75 MCG PO TABS
75.0000 ug | ORAL_TABLET | Freq: Every day | ORAL | Status: DC
  Administered 2018-02-03 – 2018-02-07 (×5): 75 ug via ORAL
  Filled 2018-02-02 (×6): qty 1

## 2018-02-02 MED ORDER — BUSPIRONE HCL 5 MG PO TABS
7.5000 mg | ORAL_TABLET | Freq: Two times a day (BID) | ORAL | Status: DC
  Administered 2018-02-03 – 2018-02-09 (×13): 7.5 mg via ORAL
  Filled 2018-02-02 (×13): qty 2

## 2018-02-02 MED ORDER — SODIUM CHLORIDE 0.9 % IV SOLN
2.0000 g | Freq: Once | INTRAVENOUS | Status: AC
  Administered 2018-02-03: 2 g via INTRAVENOUS
  Filled 2018-02-02: qty 2

## 2018-02-02 MED ORDER — HYDROCODONE-ACETAMINOPHEN 5-325 MG PO TABS
1.0000 | ORAL_TABLET | ORAL | Status: DC | PRN
  Administered 2018-02-03 – 2018-02-10 (×16): 1 via ORAL
  Filled 2018-02-02 (×18): qty 1

## 2018-02-02 MED ORDER — ONDANSETRON HCL 4 MG/2ML IJ SOLN: 4.0000 mg | Freq: Four times a day (QID) | INTRAMUSCULAR | Status: DC | PRN

## 2018-02-02 MED ORDER — DORZOLAMIDE HCL 2 % OP SOLN
1.0000 [drp] | Freq: Two times a day (BID) | OPHTHALMIC | Status: DC
  Administered 2018-02-02 – 2018-02-11 (×18): 1 [drp] via OPHTHALMIC
  Filled 2018-02-02: qty 10

## 2018-02-02 NOTE — ED Provider Notes (Signed)
[facility] EMERGENCY DEPARTMENT Provider Note   CSN: [id number] Arrival date & time: 02/02/18  7867     History   Chief Complaint Chief Complaint  Patient presents with  . Foot Pain    HPI [name] is a 84 y.o. female.  Pt presents to the ED today with right foot pain.  Pt denies any new fall.  She has a hx of atherosclerosis and a nonhealing wound to her heels.  The pt has had these wounds for months.  The pt does not walk.  Pt's daughter is here and gives more hx.  The pt has hardware from right ankle coming out of her leg.  She has been seeing Dr. Doran Durand (ortho) for these sx and pt has been on abx.  They have not taken her to surgery to remove the plates b/c she is a high risk patient.  Pt's daughter said she was told by Dr. Doran Durand to take her to the ED for evaluation.     Past Medical History:  Diagnosis Date  . Aortic atherosclerosis ([place]) 07/07/2017  . Atrial fibrillation (HCC)    chronic Coumadin.   . CHF (congestive heart failure) ([place])   . COPD (chronic obstructive pulmonary disease) ([place])   . Glaucoma   . Hypothyroidism   . Neuropathy    PERIPHERAL  . Osteoarthritis   . Osteoporosis   . Trimalleolar fracture    LEFT TRIMALLEOLAR FRACTURE, DISLOCATION WITH OBLIQUE FRACTURE OF THE DISTAL FIBULAR DIAPHYSIS, AND NOTED BEING MILDLY COMMINUTED    Patient Active Problem List   Diagnosis Date Noted  . Macrocytic anemia 02/02/2018  . Infection of prosthesis ([place]) 02/02/2018  . Anxiety 02/02/2018  . Agitation 02/02/2018  . Wound infection complicating hardware ([place]) 02/02/2018  . Anticoagulated on Coumadin   . Chronic systolic CHF (congestive heart failure) ([place]) 07/07/2017  . Chronic respiratory failure with hypoxia ([place]) 07/07/2017  . Aortic atherosclerosis ([place]) 07/07/2017  . Pressure injury of skin 04/01/2017  . Protein-calorie malnutrition, severe 04/01/2017  . Cardiomyopathy ([place]) 10/09/2016  . Encounter for hospice care  discussion   . Dysphagia   . COPD (chronic obstructive pulmonary disease) ([place]) 10/04/2016  . Debility   . Hypothyroidism   . Glaucoma   . Neuropathy   . Fall   . Atrial fibrillation ([place]) 03/02/2007  . PSORIASIS 03/02/2007    Past Surgical History:  Procedure Laterality Date  . COLONOSCOPY W/ POLYPECTOMY  11/2010   Dr Fuller Plan.  multiple polyps:cecal, transverse, sigmoid.  largest 36mm, path: tubular adenomas without HGD.  mild sigmoid diverticulosis.   Marland Kitchen ORIF TIBIA & FIBULA FRACTURES  2016  . VAGINAL HYSTERECTOMY       OB History   None      Home Medications    Prior to Admission medications   Medication Sig Start Date End Date Taking? Authorizing Provider  acetaminophen (TYLENOL) 500 MG tablet Take 1,000 mg by mouth as needed for mild pain or headache.   Yes [provider]  albuterol (PROVENTIL) (2.5 MG/3ML) 0.083% nebulizer solution Take 2.5 mg every 6 (six) hours as needed by nebulization for wheezing or shortness of breath.   Yes [provider]  busPIRone (BUSPAR) 7.5 MG tablet Take 7.5 mg by mouth 2 (two) times daily.   Yes [provider]  collagenase (SANTYL) ointment Apply topically daily. Apply Santyl to right outer ankle and right heel wounds Q day, then cover with foam dressing.  (Change foam dressing Q 3 days or  PRN soiling.) 07/10/17  Yes Samuella Cota, MD  dorzolamide (TRUSOPT) 2 % ophthalmic solution Place 1 drop 2 (two) times daily into the left eye.   Yes [provider]  ergocalciferol (VITAMIN D2) 50000 UNITS capsule Take 50,000 Units by mouth every Thursday.    Yes [provider]  FLUoxetine (PROZAC) 40 MG capsule Take 40 mg by mouth daily.  03/29/17  Yes [provider]  furosemide (LASIX) 20 MG tablet Take 20 mg by mouth 2 (two) times daily as needed for fluid. 08/07/15  Yes [provider]  gabapentin (NEURONTIN) 600 MG tablet Take 600 mg by mouth 2 (two) times daily.    Yes [provider]  guaiFENesin (MUCINEX) 600 MG 12 hr tablet Take 600 mg by mouth 2 (two) times daily as needed for cough or to loosen phlegm.    Yes [provider]  levothyroxine (SYNTHROID, LEVOTHROID) 75 MCG tablet Take 75 mcg by mouth daily before breakfast.    Yes [provider]  loperamide (IMODIUM) 2 MG capsule Take 1 capsule (2 mg total) by mouth daily as needed for diarrhea or loose stools. 04/09/17  Yes Short, Noah Delaine, MD  loratadine (CLARITIN) 10 MG tablet Take 10 mg by mouth daily as needed for allergies.    Yes [provider]  methocarbamol (ROBAXIN) 500 MG tablet Take 500 mg 2 (two) times daily by mouth. 07/02/17  Yes [provider]  metoprolol tartrate (LOPRESSOR) 25 MG tablet Take 12.5 mg 2 (two) times daily by mouth.   Yes [provider]  mirtazapine (REMERON) 7.5 MG tablet Take 7.5 mg by mouth every evening. 03/09/17  Yes [provider]  nystatin cream (MYCOSTATIN) Apply 1 application topically as needed for dry skin (rash).   Yes [provider]  omeprazole (PRILOSEC) 40 MG capsule Take 40 mg by mouth daily.    Yes [provider]  OXYGEN Inhale 3 L daily into the lungs. Continuous    Yes [provider]  pilocarpine (PILOCAR) 4 % ophthalmic solution Place 1 drop 2 (two) times daily into the left eye.   Yes [provider]  rOPINIRole (REQUIP) 1 MG tablet Take 1 mg by mouth every evening. 02/11/17  Yes [provider]  timolol (TIMOPTIC) 0.5 % ophthalmic solution Place 1 drop into the left eye 2 (two) times daily. 09/11/16  Yes [provider]  travoprost, benzalkonium, (TRAVATAN) 0.004 % ophthalmic solution Place 1 drop into the left eye at bedtime.    Yes [provider]  warfarin (COUMADIN) 2 MG tablet Take 1 mg by mouth daily. Taking 1/2 tablet (1mg ) daily at 6pm   Yes [provider]  warfarin (COUMADIN) 3 MG tablet Take 0.5 tablets (1.5 mg total) by  mouth daily at 6 PM. Patient not taking: Reported on 02/02/2018 07/09/17   Samuella Cota, MD    Family History Family History  Problem Relation Age of Onset  . CAD Mother   . Heart failure Father   . Heart failure Brother   . CAD Brother   . Colon cancer Neg Hx   . Stomach cancer Neg Hx     Social History Social History   Tobacco Use  . Smoking status: Never Smoker  . Smokeless tobacco: Never Used  Substance Use Topics  . Alcohol use: No  . Drug use: No     Allergies   Patient has no known allergies.   Review of Systems Review of Systems  Musculoskeletal:  Foot pain  All other systems reviewed and are negative.    Physical Exam Updated Vital Signs BP (!) 88/58 (BP Location: Left Arm)   Pulse 63   Temp 97.7 F (36.5 C) (Oral)   Resp (!) 21   Ht 5\' 7"  (1.702 m)   Wt 55.3 kg (122 lb)   SpO2 100%   BMI 19.11 kg/m   Physical Exam  Constitutional: She is oriented to person, place, and time. She appears well-developed and well-nourished.  HENT:  Head: Normocephalic and atraumatic.  Right Ear: External ear normal.  Left Ear: External ear normal.  Nose: Nose normal.  Mouth/Throat: Oropharynx is clear and moist.  Eyes: Pupils are equal, round, and reactive to light. Conjunctivae and EOM are normal.  Neck: Normal range of motion. Neck supple.  Cardiovascular: An irregularly irregular rhythm present.  Pulmonary/Chest: Effort normal and breath sounds normal.  Abdominal: Soft. Bowel sounds are normal.  Musculoskeletal:  Bilateral foot drop and heel ulcers.  No redness or swelling.  Right lower leg:  Orthopedic hardware visualized to right lateral ankle.  Neurological: She is alert and oriented to person, place, and time.  Skin: Skin is warm. Capillary refill takes less than 2 seconds.  Psychiatric: She has a normal mood and affect. Her behavior is normal. Judgment and thought content normal.  Nursing note and vitals reviewed.    ED Treatments /  Results  Labs (all labs ordered are listed, but only abnormal results are displayed) Labs Reviewed  MRSA PCR SCREENING - Abnormal; Notable for the following components:      Result Value   MRSA by PCR POSITIVE (*)    All other components within normal limits  BASIC METABOLIC PANEL - Abnormal; Notable for the following components:   Calcium 8.7 (*)    GFR calc non Af Amer 57 (*)    All other components within normal limits  CBC WITH DIFFERENTIAL/PLATELET - Abnormal; Notable for the following components:   RBC 2.82 (*)    Hemoglobin 8.8 (*)    HCT 28.3 (*)    MCV 100.4 (*)    RDW 17.4 (*)    All other components within normal limits  BRAIN NATRIURETIC PEPTIDE - Abnormal; Notable for the following components:   B Natriuretic Peptide 1,003.0 (*)    All other components within normal limits  PROTIME-INR - Abnormal; Notable for the following components:   Prothrombin Time 20.1 (*)    All other components within normal limits  SEDIMENTATION RATE - Abnormal; Notable for the following components:   Sed Rate 40 (*)    All other components within normal limits  C-REACTIVE PROTEIN - Abnormal; Notable for the following components:   CRP 1.5 (*)    All other components within normal limits  BASIC METABOLIC PANEL - Abnormal; Notable for the following components:   Calcium 8.4 (*)    All other components within normal limits  CBC WITH DIFFERENTIAL/PLATELET - Abnormal; Notable for the following components:   RBC 3.00 (*)    Hemoglobin 9.2 (*)    HCT 29.0 (*)    RDW 17.2 (*)    Lymphs Abs 0.6 (*)    All other components within normal limits  C-REACTIVE PROTEIN - Abnormal; Notable for the following components:   CRP 1.8 (*)    All other components within normal limits  SEDIMENTATION RATE - Abnormal; Notable for the following components:   Sed Rate 45 (*)    All other components within normal limits  FOLATE  RBC - Abnormal; Notable for the following components:   Hematocrit 28.8 (*)     All other components within normal limits  PROTIME-INR - Abnormal; Notable for the following components:   Prothrombin Time 20.2 (*)    All other components within normal limits  PROTIME-INR - Abnormal; Notable for the following components:   Prothrombin Time 16.8 (*)    All other components within normal limits  TSH - Abnormal; Notable for the following components:   TSH 74.830 (*)    All other components within normal limits  I-STAT CG4 LACTIC ACID, ED - Abnormal; Notable for the following components:   Lactic Acid, Venous 2.01 (*)    All other components within normal limits  CULTURE, BLOOD (ROUTINE X 2)  CULTURE, BLOOD (ROUTINE X 2)  URINALYSIS, ROUTINE W REFLEX MICROSCOPIC  VITAMIN B12  PROTIME-INR  I-STAT TROPONIN, ED  I-STAT CG4 LACTIC ACID, ED    EKG EKG Interpretation  Date/Time:  Wednesday February 02 2018 13:09:31 EDT Ventricular Rate:  59 PR Interval:    QRS Duration: 98 QT Interval:  423 QTC Calculation: 470 R Axis:   39 Text Interpretation:  Atrial fibrillation Ventricular tachycardia, unsustained Low voltage, precordial leads Nonspecific repol abnormality, diffuse leads Minimal ST elevation, inferior leads No significant change since last tracing Confirmed by Isla Pence [phone]) on 02/02/2018 1:15:49 PM   Radiology No results found.  Procedures Procedures (including critical care time)  Medications Ordered in ED Medications  albuterol (PROVENTIL) (2.5 MG/3ML) 0.083% nebulizer solution 2.5 mg (has no administration in time range)  busPIRone (BUSPAR) tablet 7.5 mg (7.5 mg Oral Given 02/04/18 2152)  dorzolamide (TRUSOPT) 2 % ophthalmic solution 1 drop (1 drop Left Eye Given 02/04/18 2152)  FLUoxetine (PROZAC) capsule 40 mg (40 mg Oral Given 02/04/18 0929)  gabapentin (NEURONTIN) tablet 600 mg (600 mg Oral Given 02/04/18 2152)  guaiFENesin (MUCINEX) 12 hr tablet 600 mg (has no administration in time range)  levothyroxine (SYNTHROID, LEVOTHROID) tablet 75 mcg (75  mcg Oral Given 02/04/18 0900)  methocarbamol (ROBAXIN) tablet 500 mg (500 mg Oral Given 02/04/18 2153)  mirtazapine (REMERON) tablet 7.5 mg (7.5 mg Oral Given 02/04/18 1641)  pantoprazole (PROTONIX) EC tablet 40 mg (40 mg Oral Given 02/04/18 0929)  pilocarpine (PILOCAR) 4 % ophthalmic solution 1 drop (1 drop Left Eye Given 02/04/18 2152)  rOPINIRole (REQUIP) tablet 1 mg (1 mg Oral Given 02/04/18 1642)  timolol (TIMOPTIC) 0.5 % ophthalmic solution 1 drop (1 drop Left Eye Given 02/04/18 2152)  Travoprost (BAK Free) (TRAVATAN) 0.004 % ophthalmic solution SOLN 1 drop (1 drop Left Eye Given 02/04/18 2152)  sodium chloride flush (NS) 0.9 % injection 3 mL (0 mLs Intravenous Duplicate 2/95/18 8416)  sodium chloride flush (NS) 0.9 % injection 3 mL (3 mLs Intravenous Given 02/04/18 2217)  sodium chloride flush (NS) 0.9 % injection 3 mL (has no administration in time range)  0.9 %  sodium chloride infusion (has no administration in time range)  acetaminophen (TYLENOL) tablet 650 mg (has no administration in time range)    Or  acetaminophen (TYLENOL) suppository 650 mg (has no administration in time range)  HYDROcodone-acetaminophen (NORCO/VICODIN) 5-325 MG per tablet 1-2 tablet (1 tablet Oral Given 02/04/18 1357)  senna-docusate (Senokot-S) tablet 1 tablet (has no administration in time range)  bisacodyl (DULCOLAX) EC tablet 5 mg (has no administration in time range)  ondansetron (ZOFRAN) tablet 4 mg (has no administration in time range)    Or  ondansetron (ZOFRAN) injection 4 mg (has no  administration in time range)  Warfarin - Pharmacist Dosing Inpatient ( Does not apply Given 02/04/18 1744)  vancomycin (VANCOCIN) 500 mg in sodium chloride 0.9 % 100 mL IVPB (500 mg Intravenous New Bag/Given 02/04/18 2302)  ceFEPIme (MAXIPIME) 1 g in sodium chloride 0.9 % 100 mL IVPB (1 g Intravenous New Bag/Given 02/04/18 2156)  mupirocin ointment (BACTROBAN) 2 % 1 application (1 application Nasal Given 02/04/18 2153)    Chlorhexidine Gluconate Cloth 2 % PADS 6 each (6 each Topical Given 02/05/18 0529)  feeding supplement (ENSURE ENLIVE) (ENSURE ENLIVE) liquid 237 mL (237 mLs Oral Given 02/04/18 2216)  multivitamin with minerals tablet 1 tablet (1 tablet Oral Given 02/04/18 1357)  acetaminophen (TYLENOL) tablet 1,000 mg (1,000 mg Oral Given 02/02/18 1049)  haloperidol lactate (HALDOL) injection 1 mg (1 mg Intravenous Given 02/02/18 1817)  sodium chloride 0.9 % bolus 500 mL (0 mLs Intravenous Stopped 02/02/18 2115)  ceFEPIme (MAXIPIME) 2 g in sodium chloride 0.9 % 100 mL IVPB (0 g Intravenous Stopped 02/03/18 0230)  vancomycin (VANCOCIN) IVPB 1000 mg/200 mL premix (0 mg Intravenous Stopped 02/03/18 0438)  warfarin (COUMADIN) tablet 2 mg (2 mg Oral Given 02/03/18 1652)  warfarin (COUMADIN) tablet 2 mg (2 mg Oral Given 02/04/18 1642)     Initial Impression / Assessment and Plan / ED Course  I have reviewed the triage vital signs and the nursing notes.  Pertinent labs & imaging results that were available during my care of the patient were reviewed by me and considered in my medical decision making (see chart for details).    There is 1 bp listed as 70, but that is not accurate.  I readjusted cuff and it is 105.  Pt d/w Dr. Doran Durand who will come see pt.   Pt signed out to Dr. Alvino Chapel pending Dr. Nona Dell recommendation.  Final Clinical Impressions(s) / ED Diagnoses   Final diagnoses:  Fixation hardware in leg  Chronic anemia  Permanent atrial fibrillation (HCC)  Anticoagulated on Coumadin    ED Discharge Orders        Ordered    oxyCODONE (ROXICODONE) 5 MG immediate release tablet  Every 4 hours PRN,   Status:  Discontinued     02/03/18 1228    senna (SENOKOT) 8.6 MG TABS tablet  2 times daily,   Status:  Discontinued     02/03/18 1228    docusate sodium (COLACE) 100 MG capsule  2 times daily,   Status:  Discontinued     02/03/18 1228    enoxaparin (LOVENOX) 40 MG/0.4ML injection  Every 24 hours,    Status:  Discontinued     02/03/18 1228    oxyCODONE (ROXICODONE) 5 MG immediate release tablet  Every 4 hours PRN,   Status:  Discontinued     02/03/18 1236    senna (SENOKOT) 8.6 MG TABS tablet  2 times daily,   Status:  Discontinued     02/03/18 1236    docusate sodium (COLACE) 100 MG capsule  2 times daily,   Status:  Discontinued     02/03/18 1236    enoxaparin (LOVENOX) 40 MG/0.4ML injection  Every 24 hours,   Status:  Discontinued     02/03/18 1236       Isla Pence, MD 02/05/18 [phone]

## 2018-02-02 NOTE — ED Notes (Signed)
In and Out catheter insertion and collection attempted x1 unsuccessfully by this tech. RN notified.

## 2018-02-02 NOTE — ED Notes (Signed)
Patient transported to CT 

## 2018-02-02 NOTE — Consult Note (Signed)
Reason for Consult:  Chronic right leg wound Referring Physician: Dr. Nani Gasser is an 84 y.o. female.  HPI: the patient is an 84 y/o female with a complicated PMH.  She was sent to the ER by her podiatrist after a home visit last week.  She had ORIF of a right ankle fracture in about 2012 by Dr. Telford Nab.  She has had difficulty with the wound over the years including approximately 18 months of local wound care at one point.  Over the last few months his daughters have noted increase in the size of the right lateral ankle wound.  The patient has been on oral abx several times, though she is not currently on any.  She takes coumadin for a fib.  She is non ambulatory and has a h/o heel pressure ulcers bilaterally.  She has been on percocet for chronic pain but opioid medications stopped when she changed primary care doctors.  The patient's hisstory is provided by her family.  She apparently c/o pain regularly in the right leg.  She gets agitated at night and removes her pravalon boots.  She then rubs her heels against the bed causing the heel ulcers.  Past Medical History:  Diagnosis Date  . Aortic atherosclerosis ([facility]) 07/07/2017  . Atrial fibrillation ([facility])    chronic Coumadin.   . CHF (congestive heart failure) ([place])   . COPD (chronic obstructive pulmonary disease) ([place])   . Glaucoma   . Hypothyroidism   . Neuropathy    PERIPHERAL  . Osteoarthritis   . Osteoporosis   . Trimalleolar fracture    LEFT TRIMALLEOLAR FRACTURE, DISLOCATION WITH OBLIQUE FRACTURE OF THE DISTAL FIBULAR DIAPHYSIS, AND NOTED BEING MILDLY COMMINUTED    Past Surgical History:  Procedure Laterality Date  . COLONOSCOPY W/ POLYPECTOMY  11/2010   Dr Fuller Plan.  multiple polyps:cecal, transverse, sigmoid.  largest 80m, path: tubular adenomas without HGD.  mild sigmoid diverticulosis.   .Marland KitchenORIF TIBIA & FIBULA FRACTURES  2016  . VAGINAL HYSTERECTOMY      Family History  Problem Relation Age of Onset  . CAD Mother    . Heart failure Father   . Heart failure Brother   . CAD Brother   . Colon cancer Neg Hx   . Stomach cancer Neg Hx     Social History:  reports that she has never smoked. She has never used smokeless tobacco. She reports that she does not drink alcohol or use drugs.  Allergies: No Known Allergies  Medications: I have reviewed the patient's current medications.  Results for orders placed or performed during the hospital encounter of 02/02/18 (from the past 48 hour(s))  I-stat troponin, ED     Status: None   Collection Time: 02/02/18 11:50 AM  Result Value Ref Range   Troponin i, poc 0.00 0.00 - 0.08 ng/mL   Comment 3            Comment: Due to the release kinetics of cTnI, a negative result within the first hours of the onset of symptoms does not rule out myocardial infarction with certainty. If myocardial infarction is still suspected, repeat the test at appropriate intervals.   I-Stat CG4 Lactic Acid, ED     Status: Abnormal   Collection Time: 02/02/18 11:53 AM  Result Value Ref Range   Lactic Acid, Venous 2.01 (HH) 0.5 - 1.9 mmol/L   Comment NOTIFIED PHYSICIAN   Basic metabolic panel     Status: Abnormal   Collection Time:  02/02/18  1:20 PM  Result Value Ref Range   Sodium 140 135 - 145 mmol/L   Potassium 4.9 3.5 - 5.1 mmol/L    Comment: SLIGHT HEMOLYSIS   Chloride 106 101 - 111 mmol/L   CO2 27 22 - 32 mmol/L   Glucose, Bld 90 65 - 99 mg/dL   BUN 10 6 - 20 mg/dL   Creatinine, Ser 0.87 0.44 - 1.00 mg/dL   Calcium 8.7 (L) 8.9 - 10.3 mg/dL   GFR calc non Af Amer 57 (L) >60 mL/min   GFR calc Af Amer >60 >60 mL/min    Comment: (NOTE) The eGFR has been calculated using the CKD EPI equation. This calculation has not been validated in all clinical situations. eGFR's persistently <60 mL/min signify possible Chronic Kidney Disease.    Anion gap 7 5 - 15    Comment: Performed at [facility] [address] [place], [place] [address]  CBC with Differential      Status: Abnormal   Collection Time: 02/02/18  1:20 PM  Result Value Ref Range   WBC 5.9 4.0 - 10.5 K/uL   RBC 2.82 (L) 3.87 - 5.11 MIL/uL   Hemoglobin 8.8 (L) 12.0 - 15.0 g/dL   HCT 28.3 (L) 36.0 - 46.0 %   MCV 100.4 (H) 78.0 - 100.0 fL   MCH 31.2 26.0 - 34.0 pg   MCHC 31.1 30.0 - 36.0 g/dL   RDW 17.4 (H) 11.5 - 15.5 %   Platelets 311 150 - 400 K/uL   Neutrophils Relative % 73 %   Neutro Abs 4.3 1.7 - 7.7 K/uL   Lymphocytes Relative 13 %   Lymphs Abs 0.8 0.7 - 4.0 K/uL   Monocytes Relative 9 %   Monocytes Absolute 0.5 0.1 - 1.0 K/uL   Eosinophils Relative 3 %   Eosinophils Absolute 0.2 0.0 - 0.7 K/uL   Basophils Relative 1 %   Basophils Absolute 0.0 0.0 - 0.1 K/uL   Immature Granulocytes 1 %   Abs Immature Granulocytes 0.0 0.0 - 0.1 K/uL    Comment: Performed at [facility], [address], [place], [place] [address]  Protime-INR     Status: Abnormal   Collection Time: 02/02/18  1:20 PM  Result Value Ref Range   Prothrombin Time 20.1 (H) 11.4 - 15.2 seconds   INR 1.73     Comment: Performed at [facility] [address] [place], [place] [address]  Brain natriuretic peptide     Status: Abnormal   Collection Time: 02/02/18  1:21 PM  Result Value Ref Range   B Natriuretic Peptide 1,003.0 (H) 0.0 - 100.0 pg/mL    Comment: Performed at [facility] [address] [place], [place] [address]  I-Stat CG4 Lactic Acid, ED     Status: None   Collection Time: 02/02/18  2:26 PM  Result Value Ref Range   Lactic Acid, Venous 1.28 0.5 - 1.9 mmol/L  Sedimentation rate     Status: Abnormal   Collection Time: 02/02/18  3:23 PM  Result Value Ref Range   Sed Rate 40 (H) 0 - 22 mm/hr    Comment: Performed at [facility], [place] [address], [place], [place] [address]  C-reactive protein     Status: Abnormal   Collection Time: 02/02/18  3:23 PM  Result Value Ref Range   CRP 1.5 (H) <1.0 mg/dL    Comment: Performed at [facility], [address]  [place], [place] [address]    Dg Chest 2 View  Result Date: 02/02/2018 CLINICAL DATA:  Cough and shortness of breath EXAM: CHEST - 2 VIEW COMPARISON:  July 05, 2017 FINDINGS: There is cardiomegaly with mild pulmonary venous hypertension. There are pleural effusions bilaterally with atelectatic change in each lung base. There is also atelectatic change in right upper lobe. No evident adenopathy. There is aortic atherosclerosis. Bones osteoporotic. There are several collapsed mid lower thoracic vertebral bodies with increase in kyphosis. IMPRESSION: Pulmonary vascular congestion. Pleural effusions bilaterally. Areas of patchy atelectasis bilaterally but no appreciable edema or frank consolidation. There is aortic atherosclerosis. Bones osteoporotic with multiple compression fractures. Aortic Atherosclerosis (ICD10-I70.0). Electronically Signed   By: Lowella Grip III M.D.   On: 02/02/2018 11:21   Dg Ankle Complete Right  Result Date: 02/02/2018 CLINICAL DATA:  Atraumatic right foot pain EXAM: RIGHT ANKLE - COMPLETE 3+ VIEW COMPARISON:  02/09/2008 fluoroscopy. FINDINGS: Marked osteopenia. Remote distal fibula fracture with plate and screw fixation. The fracture is healed. The upper for screws are proud, especially the most proximal screw, stable. No evidence of fracture. There is lucency about the tips of the upper 4 screws, not seen on fluoroscopy. Remote and healed medial malleolus fracture. Hardware has been removed since prior. Tibiotalar joint narrowing. Marked osteopenia. IMPRESSION: 1. Remote and healed fibular fracture with plate and screw fixation. The upper screws are proud, stable from 02/09/2008 fluoroscopy. There is lucency about the tips of the upper 4 screws not seen on prior fluoroscopy, please correlate for clinical signs of infection. 2. Remote and healed medial malleolus fracture. 3. Tibiotalar osteoarthritis and prominent osteopenia. Electronically Signed   By: Monte Fantasia M.D.   On:  02/02/2018 12:37   Dg Foot Complete Right  Result Date: 02/02/2018 CLINICAL DATA:  RIGHT entire foot pain, no fall or known injury. History of RIGHT ankle fracture. EXAM: RIGHT FOOT COMPLETE - 3+ VIEW COMPARISON:  None. FINDINGS: Severe osteopenia throughout the RIGHT foot limits characterization of osseous detail, however, there are questionable slightly displaced fractures at the distal margins of the first and second proximal phalanx. Remainder of the osseous structures of the RIGHT foot appear intact and normally aligned. Fixation hardware partially imaged at the distal fibula. IMPRESSION: 1. Questionable slightly displaced fractures at the distal aspects of the first and second proximal phalanx, not convincing, seen on only one view. Consider CT for confirmation. 2. Severe osteopenia. Electronically Signed   By: Franki Cabot M.D.   On: 02/02/2018 10:38    ROS:  No recent f/c/n/v.  + weight los over the last few months. PE:  Blood pressure (!) 119/103, pulse (!) 124, temperature 98.2 F (36.8 C), temperature source Oral, resp. rate 19, SpO2 (!) 87 %. Elderly thin appearing female in nad.  Alert.  Not oriented.  EOMI.  resp unlabored.  B LEs with severe flexion contractures at the ankles.  Skin at the lateral ankle has a large ulcer with a plate evident.  The ulcer is approximately 10 cm long and 2 cm wide.  No cellulitis.  Scant serous drainage today.  Intact sens to LT at the ankle and leg.  No palpable pulses.  Brisk cap refill at the toes.  Assessment/Plan: Chronic right ankle ulcer with exposed bone and hardware.  I explained the nature of the condition to the patient's daughters in detail.  While it's possible to remove her hardware, I think it's unlikely that her wound would heal.  She would  require IV abx for 6 weeks and plastic surgery consultation with likely a need for multiple procedures.  I believe the risks of multiple surgical interventions to try to eradicate the infection would be  very high and could conceivably lead to death or loss of limb.  I don't believe that hardware removal will significantly affect her pain.    Alternatively we could take a palliative approach with oral abx for suppression.   I would favor this approach as having the least risk while improving her quality of life.  The patient's daughters understand this plan and agree.  I spoke with Dr. Alvino Chapel who will contact the hospitalist team for further evaluation.  I will follow her while she is here.  Wylene Simmer 02/02/2018, 6:42 PM

## 2018-02-02 NOTE — ED Provider Notes (Signed)
  Physical Exam  BP (!) 116/50   Pulse (!) 124   Temp 98.2 F (36.8 C) (Oral)   Resp 15   SpO2 (!) 87%   Physical Exam  ED Course/Procedures     Procedures  MDM  Patient presents with pain in the right foot with exposed hardware on her distal fibula.  Reportedly has been exposed for a while.  However has been managed by podiatry and I will send him to see orthopedic surgery.  Seen by Dr. Doran Durand.  Thinks with all her comorbidities she would not be a good candidate for surgery.  More towards palliative treatment of this.  He recommends admission to medicine with infectious disease consult for further plans for suppression treatment.  Has multiple comorbidities.  Also has been more altered while she has been here.  Urine does not show infection.BNP is elevated.  Some vascular chest strain on chest x-ray.  Did have episode of hypotension after haldol.  Will admit to internal medicine.      Adrienne Belling, MD 02/02/18 2021

## 2018-02-02 NOTE — H&P (Signed)
History and Physical    [name] XBD:[id number] DOB: April 08, 1927 DOA: 02/02/2018  PCP: Tamsen Roers, MD   Patient coming from: Home   Chief Complaint: Right leg wound with severe pain   HPI: [name] is a 84 y.o. female with medical history significant for hypothyroidism, anxiety, agitation, chronic systolic CHF, atrial fibrillation on warfarin, and COPD with chronic hypoxic respiratory failure, and nonhealing right leg wound now presenting to the emergency department for evaluation of severe leg pains and nonhealing wound.  Patient underwent ORIF of the right ankle in 2012 and says subsequently been complicated by poor wound healing.  Patient was previously managed in the wound care clinic and has been treated with multiple doses of oral antibiotics.  She is not currently on antibiotics.  Family reports that she complains of severe pain at the site, which they described as worsening with exposure of underlying surgical hardware and bone.   ED Course: Upon arrival to the ED, patient is found to be afebrile, saturating adequately on her usual 3 Lpm, hypotensive, and bradycardic. EKG features atrial fibrillation and CXR is notable for vascular congestion and pleural effusions. Chemistry panel in unremarkable and CBC is notable for stable Hgb at 8.8 and newly elevated MCV. Lactic acid slightly elevated to 2.01. INR is subtherapeutic at 1.73. Troponin undetectable and BNP elevated to 1003. UA unremarkable. CRP and ESR slightly elevated.  Cultures were collected, 1 mg IV Haldol was given for agitation, and orthopedic surgery was consulted by the ED physician.  Orthopedic surgery evaluated patient in the emergency department, discussed treatment with patient's family, and medical admission for conservative treatment with IV antibiotics was recommended.     Review of Systems:  Unable to complete ROS due to patient's clinical condition.   Past Medical History:  Diagnosis Date  . Aortic  atherosclerosis ([place]) 07/07/2017  . Atrial fibrillation (HCC)    chronic Coumadin.   . CHF (congestive heart failure) ([place])   . COPD (chronic obstructive pulmonary disease) ([place])   . Glaucoma   . Hypothyroidism   . Neuropathy    PERIPHERAL  . Osteoarthritis   . Osteoporosis   . Trimalleolar fracture    LEFT TRIMALLEOLAR FRACTURE, DISLOCATION WITH OBLIQUE FRACTURE OF THE DISTAL FIBULAR DIAPHYSIS, AND NOTED BEING MILDLY COMMINUTED    Past Surgical History:  Procedure Laterality Date  . COLONOSCOPY W/ POLYPECTOMY  11/2010   Dr Fuller Plan.  multiple polyps:cecal, transverse, sigmoid.  largest 55m, path: tubular adenomas without HGD.  mild sigmoid diverticulosis.   .Marland KitchenORIF TIBIA & FIBULA FRACTURES  2016  . VAGINAL HYSTERECTOMY       reports that she has never smoked. She has never used smokeless tobacco. She reports that she does not drink alcohol or use drugs.  No Known Allergies  Family History  Problem Relation Age of Onset  . CAD Mother   . Heart failure Father   . Heart failure Brother   . CAD Brother   . Colon cancer Neg Hx   . Stomach cancer Neg Hx      Prior to Admission medications   Medication Sig Start Date End Date Taking? Authorizing Provider  acetaminophen (TYLENOL) 500 MG tablet Take 1,000 mg by mouth as needed for mild pain or headache.   Yes [provider]  albuterol (PROVENTIL) (2.5 MG/3ML) 0.083% nebulizer solution Take 2.5 mg every 6 (six) hours as needed by nebulization for wheezing or shortness of breath.   Yes [provider]  busPIRone (BUSPAR)  7.5 MG tablet Take 7.5 mg by mouth 2 (two) times daily.   Yes [provider]  collagenase (SANTYL) ointment Apply topically daily. Apply Santyl to right outer ankle and right heel wounds Q day, then cover with foam dressing.  (Change foam dressing Q 3 days or PRN soiling.) 07/10/17  Yes Samuella Cota, MD  dorzolamide (TRUSOPT) 2 % ophthalmic solution Place 1 drop 2 (two) times daily  into the left eye.   Yes [provider]  ergocalciferol (VITAMIN D2) 50000 UNITS capsule Take 50,000 Units by mouth every Thursday.    Yes [provider]  FLUoxetine (PROZAC) 40 MG capsule Take 40 mg by mouth daily.  03/29/17  Yes [provider]  furosemide (LASIX) 20 MG tablet Take 20 mg by mouth 2 (two) times daily as needed for fluid. 08/07/15  Yes [provider]  gabapentin (NEURONTIN) 600 MG tablet Take 600 mg by mouth 2 (two) times daily.    Yes [provider]  guaiFENesin (MUCINEX) 600 MG 12 hr tablet Take 600 mg by mouth 2 (two) times daily as needed for cough or to loosen phlegm.    Yes [provider]  levothyroxine (SYNTHROID, LEVOTHROID) 75 MCG tablet Take 75 mcg by mouth daily before breakfast.    Yes [provider]  loperamide (IMODIUM) 2 MG capsule Take 1 capsule (2 mg total) by mouth daily as needed for diarrhea or loose stools. 04/09/17  Yes Short, Noah Delaine, MD  loratadine (CLARITIN) 10 MG tablet Take 10 mg by mouth daily as needed for allergies.    Yes [provider]  methocarbamol (ROBAXIN) 500 MG tablet Take 500 mg 2 (two) times daily by mouth. 07/02/17  Yes [provider]  metoprolol tartrate (LOPRESSOR) 25 MG tablet Take 12.5 mg 2 (two) times daily by mouth.   Yes [provider]  mirtazapine (REMERON) 7.5 MG tablet Take 7.5 mg by mouth every evening. 03/09/17  Yes [provider]  nystatin cream (MYCOSTATIN) Apply 1 application topically as needed for dry skin (rash).   Yes [provider]  omeprazole (PRILOSEC) 40 MG capsule Take 40 mg by mouth daily.    Yes [provider]  OXYGEN Inhale 3 L daily into the lungs. Continuous    Yes [provider]  pilocarpine (PILOCAR) 4 % ophthalmic solution Place 1 drop 2 (two) times daily into the left eye.   Yes [provider]  rOPINIRole (REQUIP) 1 MG tablet Take 1 mg by mouth every evening.  02/11/17  Yes [provider]  timolol (TIMOPTIC) 0.5 % ophthalmic solution Place 1 drop into the left eye 2 (two) times daily. 09/11/16  Yes [provider]  travoprost, benzalkonium, (TRAVATAN) 0.004 % ophthalmic solution Place 1 drop into the left eye at bedtime.    Yes [provider]  warfarin (COUMADIN) 2 MG tablet Take 1 mg by mouth daily. Taking 1/2 tablet ('1mg'$ ) daily at 6pm   Yes [provider]  warfarin (COUMADIN) 3 MG tablet Take 0.5 tablets (1.5 mg total) by mouth daily at 6 PM. Patient not taking: Reported on 02/02/2018 07/09/17   Samuella Cota, MD    Physical Exam: Vitals:   02/02/18 1930 02/02/18 1945 02/02/18 2000 02/02/18 2015  BP: 94/62 (!) 78/47 (!) 116/50 (!) 88/58  Pulse:    69  Resp: (!) '24 15 15 13  '$ Temp:      TempSrc:      SpO2:    100%  Constitutional: Somnolent after Haldol, no acute distress  Eyes: PERTLA, lids and conjunctivae normal ENMT: Mucous membranes are moist. Posterior pharynx clear of any exudate or lesions.   Neck: normal, supple, no masses, no thyromegaly Respiratory: Slightly diminished breath sounds bilaterally, no wheezing, no crackles. Normal respiratory effort.    Cardiovascular: Rate ~45 and irregular. No significant JVD. Abdomen: No distension, no tenderness, soft. Bowel sounds active.  Musculoskeletal: no clubbing / cyanosis. Distal right leg wound with exposed hardware and bone. Heel ulcers.   Skin: Leg wounds as above. Warm, dry, well-perfused. Neurologic: No facial asymmetry. Patellar DTRs intact. Moving all extremities.    Labs on Admission: I have personally reviewed following labs and imaging studies  CBC: Recent Labs  Lab 02/02/18 1320  WBC 5.9  NEUTROABS 4.3  HGB 8.8*  HCT 28.3*  MCV 100.4*  PLT 585   Basic Metabolic Panel: Recent Labs  Lab 02/02/18 1320  NA 140  K 4.9  CL 106  CO2 27  GLUCOSE 90  BUN 10  CREATININE 0.87  CALCIUM 8.7*   GFR: CrCl cannot be  calculated (Unknown ideal weight.). Liver Function Tests: No results for input(s): AST, ALT, ALKPHOS, BILITOT, PROT, ALBUMIN in the last 168 hours. No results for input(s): LIPASE, AMYLASE in the last 168 hours. No results for input(s): AMMONIA in the last 168 hours. Coagulation Profile: Recent Labs  Lab 02/02/18 1320  INR 1.73   Cardiac Enzymes: No results for input(s): CKTOTAL, CKMB, CKMBINDEX, TROPONINI in the last 168 hours. BNP (last 3 results) No results for input(s): PROBNP in the last 8760 hours. HbA1C: No results for input(s): HGBA1C in the last 72 hours. CBG: No results for input(s): GLUCAP in the last 168 hours. Lipid Profile: No results for input(s): CHOL, HDL, LDLCALC, TRIG, CHOLHDL, LDLDIRECT in the last 72 hours. Thyroid Function Tests: No results for input(s): TSH, T4TOTAL, FREET4, T3FREE, THYROIDAB in the last 72 hours. Anemia Panel: No results for input(s): VITAMINB12, FOLATE, FERRITIN, TIBC, IRON, RETICCTPCT in the last 72 hours. Urine analysis:    Component Value Date/Time   COLORURINE YELLOW 02/02/2018 1925   APPEARANCEUR CLEAR 02/02/2018 1925   LABSPEC 1.009 02/02/2018 1925   PHURINE 5.0 02/02/2018 1925   GLUCOSEU NEGATIVE 02/02/2018 1925   HGBUR NEGATIVE 02/02/2018 1925   BILIRUBINUR NEGATIVE 02/02/2018 1925   KETONESUR NEGATIVE 02/02/2018 1925   PROTEINUR NEGATIVE 02/02/2018 1925   UROBILINOGEN 1.0 11/22/2010 0949   NITRITE NEGATIVE 02/02/2018 1925   LEUKOCYTESUR NEGATIVE 02/02/2018 1925   Sepsis Labs: '@LABRCNTIP'$ (procalcitonin:4,lacticidven:4) )No results found for this or any previous visit (from the past 240 hour(s)).   Radiological Exams on Admission: Dg Chest 2 View  Result Date: 02/02/2018 CLINICAL DATA:  Cough and shortness of breath EXAM: CHEST - 2 VIEW COMPARISON:  July 05, 2017 FINDINGS: There is cardiomegaly with mild pulmonary venous hypertension. There are pleural effusions bilaterally with atelectatic change in each lung base.  There is also atelectatic change in right upper lobe. No evident adenopathy. There is aortic atherosclerosis. Bones osteoporotic. There are several collapsed mid lower thoracic vertebral bodies with increase in kyphosis. IMPRESSION: Pulmonary vascular congestion. Pleural effusions bilaterally. Areas of patchy atelectasis bilaterally but no appreciable edema or frank consolidation. There is aortic atherosclerosis. Bones osteoporotic with multiple compression fractures. Aortic Atherosclerosis (ICD10-I70.0). Electronically Signed   By: Lowella Grip III M.D.   On: 02/02/2018 11:21   Dg Ankle Complete Right  Result Date: 02/02/2018 CLINICAL DATA:  Atraumatic right foot pain EXAM: RIGHT ANKLE - COMPLETE  3+ VIEW COMPARISON:  02/09/2008 fluoroscopy. FINDINGS: Marked osteopenia. Remote distal fibula fracture with plate and screw fixation. The fracture is healed. The upper for screws are proud, especially the most proximal screw, stable. No evidence of fracture. There is lucency about the tips of the upper 4 screws, not seen on fluoroscopy. Remote and healed medial malleolus fracture. Hardware has been removed since prior. Tibiotalar joint narrowing. Marked osteopenia. IMPRESSION: 1. Remote and healed fibular fracture with plate and screw fixation. The upper screws are proud, stable from 02/09/2008 fluoroscopy. There is lucency about the tips of the upper 4 screws not seen on prior fluoroscopy, please correlate for clinical signs of infection. 2. Remote and healed medial malleolus fracture. 3. Tibiotalar osteoarthritis and prominent osteopenia. Electronically Signed   By: Monte Fantasia M.D.   On: 02/02/2018 12:37   Dg Foot Complete Right  Result Date: 02/02/2018 CLINICAL DATA:  RIGHT entire foot pain, no fall or known injury. History of RIGHT ankle fracture. EXAM: RIGHT FOOT COMPLETE - 3+ VIEW COMPARISON:  None. FINDINGS: Severe osteopenia throughout the RIGHT foot limits characterization of osseous detail,  however, there are questionable slightly displaced fractures at the distal margins of the first and second proximal phalanx. Remainder of the osseous structures of the RIGHT foot appear intact and normally aligned. Fixation hardware partially imaged at the distal fibula. IMPRESSION: 1. Questionable slightly displaced fractures at the distal aspects of the first and second proximal phalanx, not convincing, seen on only one view. Consider CT for confirmation. 2. Severe osteopenia. Electronically Signed   By: Franki Cabot M.D.   On: 02/02/2018 10:38    EKG: Independently reviewed. Atrial fibrillation.   Assessment/Plan  1. Wound infection  - Presents with non-healing right leg wound with underlying hardware exposed  - She is s/p multiple doses abx, not on any currently  - There is minimal lactate elevation without fever or leukocytosis  - Orthopedic surgery evaluated her in ED, recommendations much appreciated  - Family agrees with non-surgical approach  - Cultures collected in ED, empiric abx started, consult with wound care RN    2. Hypotension  - BP as low as 70/42 in ED, improved with 500 cc NS  - Possibly d/t infection though no fever or leukocytosis  - MAP has remained 65 or better after fluid bolus  - Hold antihypertensives  - Give additional IVF as needed, being mindful of underlying CHF   3. Atrial fibrillation  - In slow atrial fib on admission  - CHADS-VASc at least 8 (age x2, gender, CHF)  - Continue warfarin; hold metoprolol in light of bradycardia and low BP   4. Chronic systolic CHF  - Appears euvolemic, but vascular congestion noted on CXR  - Hold Lasix and metoprolol initially in light of low BP that required 500 cc bolus in ED  - Follow strict I/O and daily wts    5. COPD; chronic hypoxic respiratory failure  - No wheezing or dyspnea on admission  - Continue prn nebs, supplemental O2    6. Macrocytic anemia  - Hgb is stable at 8.8 on admission with no active  bleeding  - MCV is mildly elevated, previously normal, check B12 and folate    7. Anxiety, agitation  - Has improved recently with Buspar per family  - Agitated in ED, requiring Haldol  - Suspect foot pain is contributing  - Continue prn analgesia, Prozac, Remeron, Buspar     DVT prophylaxis: warfarin  Code Status: Full  Family Communication:  Daughters updated at bedside Consults called: Orthopedic surgery  Admission status: Inpatient    Vianne Bulls, MD [facility] Pager [phone]  If 7PM-7AM, please contact night-coverage [email or web address] Password [email or web address]  02/02/2018, 9:11 PM

## 2018-02-02 NOTE — Progress Notes (Signed)
Pharmacy Antibiotic Note  [name] is a 84 y.o. female admitted on 02/02/2018 with wound infection.  Pharmacy has been consulted for vancomycin and cefepime dosing. Pt is afebrile and WBC is WNL. Scr and lactic acid are WNL.   Plan: Vancomycin 1gm IV x 1 then 500mg  IV Q24H Cefepime 2gm IV x 1 then 1gm IV Q24H F/u renal fxn, C&S, clinical status and trough at SS  Height: 5\' 7"  (170.2 cm) Weight: 126 lb 12.2 oz (57.5 kg) IBW/kg (Calculated) : 61.6  Temp (24hrs), Avg:98.2 F (36.8 C), Min:98.2 F (36.8 C), Max:98.2 F (36.8 C)  Recent Labs  Lab 02/02/18 1153 02/02/18 1320 02/02/18 1426  WBC  --  5.9  --   CREATININE  --  0.87  --   LATICACIDVEN 2.01*  --  1.28    Estimated Creatinine Clearance: 39.8 mL/min (by C-G formula based on SCr of 0.87 mg/dL).    No Known Allergies  Antimicrobials this admission: Vanc 6/19>> Cefepime 6/19>>  Dose adjustments this admission: N/A  Microbiology results: Pending  Thank you for allowing pharmacy to be a part of this patient's care.  [name], [name] 02/02/2018 9:23 PM

## 2018-02-02 NOTE — Progress Notes (Signed)
ANTICOAGULATION CONSULT NOTE - Initial Consult  Pharmacy Consult for warfarin Indication: atrial fibrillation  No Known Allergies  Patient Measurements: Height: 5\' 7"  (170.2 cm) Weight: 126 lb 12.2 oz (57.5 kg) IBW/kg (Calculated) : 61.6  Vital Signs: Temp: 98.2 F (36.8 C) (06/19 0946) Temp Source: Oral (06/19 0946) BP: 123/63 (06/19 2115) Pulse Rate: 52 (06/19 2115)  Labs: Recent Labs    02/02/18 1320  HGB 8.8*  HCT 28.3*  PLT 311  LABPROT 20.1*  INR 1.73  CREATININE 0.87    Estimated Creatinine Clearance: 39.8 mL/min (by C-G formula based on SCr of 0.87 mg/dL).   Medical History: Past Medical History:  Diagnosis Date  . Aortic atherosclerosis ([place]) 07/07/2017  . Atrial fibrillation (HCC)    chronic Coumadin.   . CHF (congestive heart failure) ([place])   . COPD (chronic obstructive pulmonary disease) ([place])   . Glaucoma   . Hypothyroidism   . Neuropathy    PERIPHERAL  . Osteoarthritis   . Osteoporosis   . Trimalleolar fracture    LEFT TRIMALLEOLAR FRACTURE, DISLOCATION WITH OBLIQUE FRACTURE OF THE DISTAL FIBULAR DIAPHYSIS, AND NOTED BEING MILDLY COMMINUTED   Assessment: 83 yof presented to the ED with foot pain. He is on chronic coumadin for history of afib. INR is subtherapeutic at 1.73. No bleeding noted.   PTA dose 1mg  daily  Goal of Therapy:  INR 2-3 Monitor platelets by anticoagulation protocol: Yes   Plan:  Warfarin 2mg  PO x 1 tonight Daily INR  Kypton Eltringham, Rande Lawman 02/02/2018,9:25 PM

## 2018-02-02 NOTE — ED Triage Notes (Signed)
[facility]- pt coming from home with complaint of right foot pain. She has plates and screws in the foot and has had infections. No fall. Pt a&o to her baseline.   119/69 HR 70 SPO2 100 3L (chronic use)

## 2018-02-03 DIAGNOSIS — I482 Chronic atrial fibrillation: Secondary | ICD-10-CM

## 2018-02-03 LAB — BASIC METABOLIC PANEL
Anion gap: 6 (ref 5–15)
BUN: 9 mg/dL (ref 6–20)
CALCIUM: 8.4 mg/dL — AB (ref 8.9–10.3)
CO2: 26 mmol/L (ref 22–32)
CREATININE: 0.8 mg/dL (ref 0.44–1.00)
Chloride: 106 mmol/L (ref 101–111)
GFR calc Af Amer: >60 mL/min (ref >60)
GFR calc non Af Amer: >60 mL/min (ref >60)
GLUCOSE: 92 mg/dL (ref 65–99)
Potassium: 4.7 mmol/L (ref 3.5–5.1)
Sodium: 138 mmol/L (ref 135–145)

## 2018-02-03 LAB — CBC WITH DIFFERENTIAL/PLATELET
Abs Immature Granulocytes: 0 10*3/uL (ref 0.0–0.1)
BASOS PCT: 1 %
Basophils Absolute: 0 10*3/uL (ref 0.0–0.1)
EOS ABS: 0.2 10*3/uL (ref 0.0–0.7)
Eosinophils Relative: 3 %
HEMATOCRIT: 29 % — AB (ref 36.0–46.0)
Hemoglobin: 9.2 g/dL — ABNORMAL LOW (ref 12.0–15.0)
Immature Granulocytes: 0 %
Lymphocytes Relative: 11 %
Lymphs Abs: 0.6 10*3/uL — ABNORMAL LOW (ref 0.7–4.0)
MCH: 30.7 pg (ref 26.0–34.0)
MCHC: 31.7 g/dL (ref 30.0–36.0)
MCV: 96.7 fL (ref 78.0–100.0)
MONO ABS: 0.3 10*3/uL (ref 0.1–1.0)
MONOS PCT: 5 %
Neutro Abs: 4.4 10*3/uL (ref 1.7–7.7)
Neutrophils Relative %: 80 %
PLATELETS: 334 10*3/uL (ref 150–400)
RBC: 3 MIL/uL — ABNORMAL LOW (ref 3.87–5.11)
RDW: 17.2 % — AB (ref 11.5–15.5)
WBC: 5.5 10*3/uL (ref 4.0–10.5)

## 2018-02-03 LAB — SEDIMENTATION RATE: Sed Rate: 45 mm/hr — ABNORMAL HIGH (ref 0–22)

## 2018-02-03 LAB — VITAMIN B12: Vitamin B-12: 857 pg/mL (ref 180–914)

## 2018-02-03 LAB — MRSA PCR SCREENING: MRSA BY PCR: POSITIVE — AB

## 2018-02-03 LAB — PROTIME-INR
INR: 1.74
PROTHROMBIN TIME: 20.2 s — AB (ref 11.4–15.2)

## 2018-02-03 LAB — C-REACTIVE PROTEIN: CRP: 1.8 mg/dL — ABNORMAL HIGH (ref <1.0)

## 2018-02-03 MED ORDER — OXYCODONE HCL 5 MG PO TABS: 5.0000 mg | ORAL_TABLET | ORAL | 0 refills | Status: DC | PRN

## 2018-02-03 MED ORDER — SENNA 8.6 MG PO TABS: 2.0000 | ORAL_TABLET | Freq: Two times a day (BID) | ORAL | 0 refills | Status: DC

## 2018-02-03 MED ORDER — SENNA 8.6 MG PO TABS
2.0000 | ORAL_TABLET | Freq: Two times a day (BID) | ORAL | 0 refills | Status: DC
Start: 2018-02-03 — End: 2018-02-03

## 2018-02-03 MED ORDER — WARFARIN SODIUM 2 MG PO TABS
2.0000 mg | ORAL_TABLET | Freq: Once | ORAL | Status: AC
  Administered 2018-02-03: 2 mg via ORAL
  Filled 2018-02-03: qty 1

## 2018-02-03 MED ORDER — DOCUSATE SODIUM 100 MG PO CAPS: 100.0000 mg | ORAL_CAPSULE | Freq: Two times a day (BID) | ORAL | 0 refills | Status: DC

## 2018-02-03 MED ORDER — MUPIROCIN 2 % EX OINT
1.0000 "application " | TOPICAL_OINTMENT | Freq: Two times a day (BID) | CUTANEOUS | Status: AC
  Administered 2018-02-03 – 2018-02-07 (×10): 1 via NASAL
  Filled 2018-02-03 (×3): qty 22

## 2018-02-03 MED ORDER — ENOXAPARIN SODIUM 40 MG/0.4ML ~~LOC~~ SOLN: 40.0000 mg | SUBCUTANEOUS | 0 refills | Status: DC

## 2018-02-03 MED ORDER — CHLORHEXIDINE GLUCONATE CLOTH 2 % EX PADS
6.0000 | MEDICATED_PAD | Freq: Every day | CUTANEOUS | Status: AC
  Administered 2018-02-03 – 2018-02-07 (×5): 6 via TOPICAL

## 2018-02-03 MED ORDER — ENSURE ENLIVE PO LIQD
237.0000 mL | Freq: Two times a day (BID) | ORAL | Status: DC
  Administered 2018-02-04: 237 mL via ORAL

## 2018-02-03 NOTE — Progress Notes (Signed)
Pt received PRN 1 tablet norco/vicodin before attempting to do leg dressing changes, new orders for bilateral Prevalon Boots. Pt tolerated dressing change but not tolerating Prevalon boots, attempted to reposition pt and boots and pt kept screaming out in pain. Daughter is at bedside, daughter states it would be best without prevalon boots, she has the same issue at home. Administered another PRN 1 tablet norco/vicodin to manage with pain.Will continue to monitor pt.

## 2018-02-03 NOTE — Progress Notes (Signed)
ANTICOAGULATION CONSULT NOTE - Initial Consult  Pharmacy Consult for warfarin Indication: atrial fibrillation  No Known Allergies  Patient Measurements: Height: 5\' 7"  (170.2 cm) Weight: 115 lb 4.8 oz (52.3 kg) IBW/kg (Calculated) : 61.6  Vital Signs: Temp: 97.8 F (36.6 C) (06/20 0436) Temp Source: Axillary (06/20 0436) BP: 118/84 (06/20 0436) Pulse Rate: 72 (06/20 0436)  Labs: Recent Labs    02/02/18 1320 02/03/18 0436  HGB 8.8* 9.2*  HCT 28.3* 29.0*  PLT 311 334  LABPROT 20.1* 20.2*  INR 1.73 1.74  CREATININE 0.87 0.80    Estimated Creatinine Clearance: 39.4 mL/min (by C-G formula based on SCr of 0.8 mg/dL).   Medical History: Past Medical History:  Diagnosis Date  . Aortic atherosclerosis ([place]) 07/07/2017  . Atrial fibrillation (HCC)    chronic Coumadin.   . CHF (congestive heart failure) ([place])   . COPD (chronic obstructive pulmonary disease) ([place])   . Glaucoma   . Hypothyroidism   . Neuropathy    PERIPHERAL  . Osteoarthritis   . Osteoporosis   . Trimalleolar fracture    LEFT TRIMALLEOLAR FRACTURE, DISLOCATION WITH OBLIQUE FRACTURE OF THE DISTAL FIBULAR DIAPHYSIS, AND NOTED BEING MILDLY COMMINUTED   Assessment: 60 yof presented to the ED with foot pain. She is on chronic coumadin for history of afib. INR is subtherapeutic at 1.74. No bleeding noted. Patient did not receive dose on 6/19 due to sedation.   PTA dose 1mg  daily  Goal of Therapy:  INR 2-3 Monitor platelets by anticoagulation protocol: Yes   Plan:  Warfarin 2mg  PO x 1 tonight Daily INR  Zarina Pe A. [name], PharmD, [place] Pager: [phone]   02/03/2018,8:04 AM

## 2018-02-03 NOTE — Progress Notes (Signed)
MRSA PCR positive, contact protocol initiated.

## 2018-02-03 NOTE — Progress Notes (Addendum)
Initial Nutrition Assessment  DOCUMENTATION CODES:   Severe malnutrition in context of chronic illness  INTERVENTION:  Ensure Enlive po BID, each supplement provides 350 kcal and 20 grams of protein  Magic cup TID with meals, each supplement provides 290 kcal and 9 grams of protein  Recommend liberalize diet  NUTRITION DIAGNOSIS:   Severe Malnutrition related to chronic illness(COPD, CHF, Nonhealing leg wound) as evidenced by severe fat depletion, severe muscle depletion.  GOAL:   Patient will meet greater than or equal to 90% of their needs  MONITOR:   PO intake, Supplement acceptance, Weight trends, I & O's  REASON FOR ASSESSMENT:   Malnutrition Screening Tool    ASSESSMENT:   Patient with PMH of hypothyroidism, CHF, COPD and nonhealing right leg wound presents with severe leg pain related to nonhealing leg wound    Spoke with patient, patient's daughter, and granddaughter at bedside. Family appears to have a lot of issues with helping patient get adequate PO intake. Daughter states patient will only eat tomato soup, ice cream, and ensure mixed with coffee. She has tried to cook other foods for her and patient will eat a few bites before saying she doesn't want anymore. If patient is fed the same meal multiple days in a row, she stops eating it as well per daughter. Reports a UBW of 150-160 pounds with 35-45 pound severe weight loss over 1 year  Ordered magic cup, ensure for patient. Will monitor intake. Monitor [place] conversations.  Labs reviewed Medications reviewed and include:  Remeron    NUTRITION - FOCUSED PHYSICAL EXAM:    Most Recent Value  Orbital Region  Severe depletion  Upper Arm Region  Severe depletion  Thoracic and Lumbar Region  Moderate depletion  Buccal Region  Moderate depletion  Temple Region  Moderate depletion  Clavicle Bone Region  Severe depletion  Clavicle and Acromion Bone Region  Severe depletion  Scapular Bone Region  Severe depletion   Dorsal Hand  Severe depletion  Patellar Region  Severe depletion  Anterior Thigh Region  Severe depletion  Posterior Calf Region  Severe depletion  Edema (RD Assessment)  Moderate       Diet Order:   Diet Order           Diet Heart Room service appropriate? Yes; Fluid consistency: Thin  Diet effective now          EDUCATION NEEDS:   No education needs have been identified at this time  Skin:  Skin Assessment: Skin Integrity Issues: Skin Integrity Issues:: Other (Comment) Unstageable: to R heel Other: MSAD To heel, sacrum, toe  Last BM:  02/03/2018  Height:   Ht Readings from Last 1 Encounters:  02/02/18 5\' 7"  (1.702 m)    Weight:   Wt Readings from Last 1 Encounters:  02/03/18 115 lb 4.8 oz (52.3 kg)    Ideal Body Weight:  61.36 kg  BMI:  Body mass index is 18.06 kg/m.  Estimated Nutritional Needs:   Kcal:  1400-1700 calories  Protein:  78-89 grams (1.5-1.7g/kg)  Fluid:  >1.5L    Satira Anis. Druanne Bosques, MS, RD LDN Inpatient Clinical Dietitian Pager [phone]

## 2018-02-03 NOTE — Progress Notes (Signed)
Subjective:     Patient resting comfortably in bed.  Daughter and granddaughter at bedside.  Tolerating POs well.  Denies fever, chills, N/V, CP.  Patient being followed for chronic R lower leg wound with exposed hardware.  She has had a nonambulatory status for several years.  Complex PMH.  Objective:   VITALS:  Temp:  [96.9 F (36.1 C)-97.9 F (36.6 C)] 97.8 F (36.6 C) (06/20 0436) Pulse Rate:  [28-124] 72 (06/20 0436) Resp:  [13-24] 15 (06/20 0436) BP: (70-129)/(42-103) 118/84 (06/20 0436) SpO2:  [87 %-100 %] 95 % (06/20 0436) Weight:  [52.3 kg (115 lb 4.8 oz)-57.5 kg (126 lb 12.2 oz)] 52.3 kg (115 lb 4.8 oz) (06/20 0500)  General: Frail elderly patient in NAD. Psych:  Appropriate mood and affect. Neuro:  A&O x 3, Moving all extremities, sensation intact to light touch HEENT:  EOMs intact Chest:  Even non-labored respirations Skin: Dressing intact.  Pictures reviewed that demonstrate exposure of fibular plate and screws.  Chronic heel ulcer with escar. Extremities: warm/dry, mild edema, no erythema.   Pulses: Popliteus 1+ MSK:  ROM: flexion contractures noted at bilateral ankles, MMT: able to perform quad set, (-) Homan's    LABS Recent Labs    02/02/18 1320 02/03/18 0436  HGB 8.8* 9.2*  WBC 5.9 5.5  PLT 311 334   Recent Labs    02/02/18 1320 02/03/18 0436  NA 140 138  K 4.9 4.7  CL 106 106  CO2 27 26  BUN 10 9  CREATININE 0.87 0.80  GLUCOSE 90 92   Recent Labs    02/02/18 1320 02/03/18 0436  INR 1.73 1.74     Assessment/Plan:     Chronic R ankle ulcer with exposed bone and hardware.  -Dr. Doran Durand was consulted yesterday and discussed conservative vs surgical tx with the patient's daughter.  Surgery would be quite extensive, requiring IV ABX for at least 6 weeks, plastic surgery consultation, and likely need for multiple procedures.  Limb loss and even death are high possibilities in this patient.  Alternatively palliative care with ABX for  suppression were discussed.  The family agreed that comfort care was a better plan of action.    Linton Ham per Medicine team.  May consider ID consultation.  -The patient is NWB, which is unchanged from her previous status.  -Dressing changes PRN  -Wound care per [facility] nurse.   Mechele Claude PA-C [facility] Office:  [phone]

## 2018-02-03 NOTE — Consult Note (Signed)
[facility] Nurse wound consult note Reason for Consult: Bilateral heels and right lower leg.  Assessment completed in [place]. Wound type: Right lateral malleolus has completely exposed orthopedic hardware (Plate and screws).  This area was covered with gauze and kerlex. The right inner heel wound is unstagable due to soft black eschar. The wound measures 3 cm x 3 cm x unknown depth.  It had a foam dressing to it. On the top of both great toes there are linear areas that are closed over and measure 0.4 cm x 1.7 cm from an unknown etiology. These were open to air. On the left outer malleolus there is a DTI that measures 1.3 cm x 1 cm.  This was open to air.  Plan for the right exposed hardware is for the primary RN to ask the medical provider(s) for care orders. Plan for the great toes, heel and left outer ankle is to apply betadine and allow to air dry each shift.  The toes can be left open to air, the ankle and heel should be wrapped in kerlex. An air mattress was ordered. I have entered orders to turn the patient and to float the heels. Monitor the wound area(s) for worsening of condition such as: Signs/symptoms of infection,  Increase in size,  Development of or worsening of odor, Development of pain, or increased pain at the affected locations.  Notify the medical team if any of these develop.  Thank you for the consult.  Discussed plan of care with the patient and bedside nurse.  [facility] nurse will not follow at this time.  Please re-consult the [facility] team if needed.  Val Riles, RN, MSN, CWOCN, CNS-BC, pager [phone]

## 2018-02-03 NOTE — Evaluation (Signed)
Physical Therapy Evaluation and Discharge Patient Details Name: [name] MRN: [id number] DOB: [birth year] Today's Date: 02/03/2018   History of Present Illness  Pt is an 84 y/o female admitted secondary to R ankle wound infection. PMH includes COPD on 3L of oxygen, R ankle ORIF, CHF, a fib, anxiety, and glaucoma.   Clinical Impression  Pt admitted secondary to problem above with deficits below. Pt nonambulatory at baseline, and pt's family reports they used lift equipment for all transfers. Upon eval, pt resistive to ROM activities in BLE and UEs; per family, that is pt's baseline. Spent majority of session educating family about ROM and safety during transfers. Pt would benefit from SNF at d/c, however, per family would like to take pt home at this time. Will nned ambulance transport home, and would benefit from [facility] for family education and [facility] to assist with ADL tasks. Pt with no further acute skilled PT needs at this time. Will sign off. If needs change, please reconsult.     Follow Up Recommendations Home health PT;Supervision/Assistance - 24 hour([facility]ide, HHPT for family education )    Equipment Recommendations  None recommended by PT    Recommendations for Other Services       Precautions / Restrictions Precautions Precautions: Fall Restrictions Weight Bearing Restrictions: Yes RLE Weight Bearing: Non weight bearing      Mobility  Bed Mobility               General bed mobility comments: Attempted rolling, however, pt politely refusing stating "I don't want to play today" Helped with repositioning in sidelying and adjusted pillow between LEs to correct spot.   Transfers                 General transfer comment: Nonambulatory at baseline.   Ambulation/Gait                Stairs            Wheelchair Mobility    Modified Rankin (Stroke Patients Only)       Balance                                              Pertinent Vitals/Pain Pain Assessment: Faces Faces Pain Scale: Hurts even more Pain Location: R ankle  Pain Descriptors / Indicators: Aching;Sore Pain Intervention(s): Monitored during session;Limited activity within patient's tolerance;Repositioned    Home Living Family/patient expects to be discharged to:: Private residence Living Arrangements: Children;Other (Comment)(grandaughter ) Available Help at Discharge: Family;Available 24 hours/day Type of Home: House Home Access: Ramped entrance     Home Layout: One level Home Equipment: Youth worker - 2 wheels;Shower seat - built in;Bedside commode;Hospital bed;Other (comment)(hoyer lift and pad )      Prior Function Level of Independence: Needs assistance   Gait / Transfers Assistance Needed: Family reports pt had mostly been in the bed secondary to wounds, however, would transfer to lift chair using hoyer lift.   ADL's / Homemaking Assistance Needed: Family assists with dressing, sponge bathing, toileting, etc        Hand Dominance        Extremity/Trunk Assessment   Upper Extremity Assessment Upper Extremity Assessment: Generalized weakness;RUE deficits/detail RUE Deficits / Details: Limited ROM at R shoulder. Pt also very resistive to motion at R shoulder, therefore unsure if pt was self limiting vs true limitations.  Lower Extremity Assessment Lower Extremity Assessment: RLE deficits/detail;LLE deficits/detail RLE Deficits / Details: R ankle wrapped and sore, so did not attempt ROM. Limited ROM at hip and knee, however, pt also very resistive to ROM.  LLE Deficits / Details: Limited ROM at hip, knee, and ankle. Lacing approx 5 deg from neutral in DF. Pt very resistive to hip and knee ROM.     Cervical / Trunk Assessment Cervical / Trunk Assessment: Kyphotic  Communication   Communication: HOH  Cognition Arousal/Alertness: Awake/alert Behavior During Therapy: WFL for tasks  assessed/performed Overall Cognitive Status: Within Functional Limits for tasks assessed                                        General Comments General comments (skin integrity, edema, etc.): Pt's daughter and grandaughter in room. Educated about safe body mechanics when positioning, home HEP (educated/demonstrated L ankle stretch into DF), etc.     Exercises     Assessment/Plan    PT Assessment Patent does not need any further PT services;All further PT needs can be met in the next venue of care  PT Problem List Decreased strength;Decreased range of motion;Decreased activity tolerance;Decreased balance;Decreased mobility;Decreased knowledge of precautions;Pain       PT Treatment Interventions      PT Goals (Current goals can be found in the Care Plan section)  Acute Rehab PT Goals Patient Stated Goal: for pt to get better per family  PT Goal Formulation: With family Time For Goal Achievement: 02/03/18 Potential to Achieve Goals: Fair    Frequency     Barriers to discharge        Co-evaluation               AM-PAC PT "6 Clicks" Daily Activity  Outcome Measure Difficulty turning over in bed (including adjusting bedclothes, sheets and blankets)?: Unable Difficulty moving from lying on back to sitting on the side of the bed? : Unable Difficulty sitting down on and standing up from a chair with arms (e.g., wheelchair, bedside commode, etc,.)?: Unable Help needed moving to and from a bed to chair (including a wheelchair)?: Total Help needed walking in hospital room?: Total Help needed climbing 3-5 steps with a railing? : Total 6 Click Score: 6    End of Session   Activity Tolerance: Patient tolerated treatment well Patient left: in bed;with call bell/phone within reach;with family/visitor present Nurse Communication: Mobility status PT Visit Diagnosis: Other abnormalities of gait and mobility (R26.89);Difficulty in walking, not elsewhere classified  (R26.2);Muscle weakness (generalized) (M62.81);Pain Pain - Right/Left: Right Pain - part of body: Ankle and joints of foot    Time: 1434-1501 PT Time Calculation (min) (ACUTE ONLY): 27 min   Charges:   PT Evaluation $PT Eval Moderate Complexity: 1 Mod PT Treatments $Self Care/Home Management: 8-22   PT G Codes:        Leighton Ruff, PT, DPT  Acute Rehabilitation Services  Pager: [phone]   Rudean Hitt 02/03/2018, 3:36 PM

## 2018-02-03 NOTE — Consult Note (Signed)
[facility] Nurse wound consult note Reason for Consult: non-healing right lower leg wound. Wound type:The right lateral malleolus has exposed orthopedic hardware.  I can see the plate and screws.  Care of this area would be best addressed by orthopedic services.  Please consult with Orthopedic services for directions for how to proceed caring for this area.  Hardware exposure like she has increases her risk for osteomyelitis and other complications.  I spoke with Dr. Karleen Hampshire about the situations via telephone.  She will take it from here for further care needs. Monitor the wound area(s) for worsening of condition such as: Signs/symptoms of infection,  Increase in size,  Development of or worsening of odor, Development of pain, or increased pain at the affected locations.  Notify the medical team if any of these develop.  Thank you for the consult.  Discussed plan of care with the patient and bedside nurse.  [facility] nurse will not follow at this time.  Please re-consult the [facility] team if needed.  Val Riles, RN, MSN, CWOCN, CNS-BC, pager [phone]

## 2018-02-03 NOTE — Progress Notes (Deleted)
Subjective:     Patient reports pain as mild to moderate.  Tolerating POs well.  Denies fever, chills, N/V, CP, SOB.  Resting comfortably in bed.  Objective:   VITALS:  Temp:  [96.9 F (36.1 C)-97.9 F (36.6 C)] 97.8 F (36.6 C) (06/20 0436) Pulse Rate:  [28-124] 72 (06/20 0436) Resp:  [13-24] 15 (06/20 0436) BP: (70-129)/(42-103) 118/84 (06/20 0436) SpO2:  [87 %-100 %] 95 % (06/20 0436) Weight:  [52.3 kg (115 lb 4.8 oz)-57.5 kg (126 lb 12.2 oz)] 52.3 kg (115 lb 4.8 oz) (06/20 0500)  General: WDWN patient in NAD. Psych:  Appropriate mood and affect. Neuro:  A&O x 3, Moving all extremities, sensation intact to light touch HEENT:  EOMs intact Chest:  Even non-labored respirations Skin:  Dressing/splint C/D/I, no rashes or lesions Extremities: warm/dry, mild edema, no erythema or echymosis.  No lymphadenopathy. Pulses: Popliteus 2+ MSK:  ROM: EHL/FHL intact, MMT: able to perform quad set    LABS Recent Labs    02/02/18 1320 02/03/18 0436  HGB 8.8* 9.2*  WBC 5.9 5.5  PLT 311 334   Recent Labs    02/02/18 1320 02/03/18 0436  NA 140 138  K 4.9 4.7  CL 106 106  CO2 27 26  BUN 10 9  CREATININE 0.87 0.80  GLUCOSE 90 92   Recent Labs    02/02/18 1320 02/03/18 0436  INR 1.73 1.74     Assessment/Plan:    S/P I&D open R ankle fracture with ORIF of fibula and syndesmosis  NWB R LE Up with therapy Plan to D/C home on Saturday with HHPT, OT, nursing Lovenox for DVT prophylaxis upon D/C. Scripts on chart Plan for 2 week outpatient post-op visit with Dr. Annia [facility] Office:  [phone]

## 2018-02-03 NOTE — Progress Notes (Signed)
PROGRESS NOTE    [name]  FMB:[id number] DOB: 09-14-1926 DOA: 02/02/2018 PCP: Tamsen Roers, MD    Brief Narrative:  [name] is a 84 y.o. female with medical history significant for hypothyroidism, anxiety, agitation, chronic systolic CHF, atrial fibrillation on warfarin, and COPD with chronic hypoxic respiratory failure, and nonhealing right leg wound now presenting to the emergency department for evaluation of severe leg pains and nonhealing wound.    Assessment & Plan:   Principal Problem:   Wound infection complicating hardware ([place]) Active Problems:   Atrial fibrillation (HCC)   Hypothyroidism   COPD (chronic obstructive pulmonary disease) (HCC)   Chronic systolic CHF (congestive heart failure) (HCC)   Chronic respiratory failure with hypoxia (HCC)   Macrocytic anemia   Anxiety   Agitation   Anticoagulated on Coumadin   Chronic nonhealing right leg wound with exposed hardware: Dr. Doran Durand was consulted recommended conservative management with IV antibiotics at this time. Patient is in constant pain and required pain medications. Palliative care consulted for goals of care. Blood cultures ordered and are pending.    COPD No wheezing heard, resume albuterol inhaler as needed.   Chronic systolic heart failure she appears to be compensated.   Chronic respiratory failure with hypoxia probably secondary to COPD At baseline she uses on 3 L of nasal cannula oxygen.   Cardizem Resume Synthroid at 75 MCG daily.   Chronic atrial fibrillation Controlled.  On Coumadin for anticoagulation.   DVT prophylaxis: scd's Code Status: Full code Family Communication: Daughter at bedside Disposition Plan: Pending evaluation by palliative care Consultants:   Orthopedics Dr. Doran Durand   Procedures: None  Antimicrobials: IV vancomycin and cefepime since admission  Subjective: Reports pain in the right leg  Objective: Vitals:   02/02/18 2300 02/03/18 0127  02/03/18 0436 02/03/18 0500  BP:  119/65 118/84   Pulse:  (!) 57 72   Resp:  17 15   Temp: 97.9 F (36.6 C) 97.8 F (36.6 C) 97.8 F (36.6 C)   TempSrc: Rectal Rectal Axillary   SpO2:  98% 95%   Weight:    52.3 kg (115 lb 4.8 oz)  Height:        Intake/Output Summary (Last 24 hours) at 02/03/2018 1416 Last data filed at 02/03/2018 0924 Gross per 24 hour  Intake 933.33 ml  Output 150 ml  Net 783.33 ml   Filed Weights   02/02/18 2100 02/03/18 0500  Weight: 57.5 kg (126 lb 12.2 oz) 52.3 kg (115 lb 4.8 oz)    Examination:  General exam: Appears calm and comfortable  Respiratory system: Clear to auscultation. Respiratory effort normal. Cardiovascular system: S1 & S2 heard, irregular. No JVD, murmurs, Gastrointestinal system: Abdomen is nondistended, soft and nontender. Normal bowel sounds heard. Central nervous system: alert and oriented to person only.  Extremities: pedal edemA, right non healing wound with exposed hardware on the right leg.  With right heel pressure ulcer.  Skin: sacral pressure ulcer stage 1 Psychiatry:  Mood & affect appropriate.     Data Reviewed: I have personally reviewed following labs and imaging studies  CBC: Recent Labs  Lab 02/02/18 1320 02/03/18 0436  WBC 5.9 5.5  NEUTROABS 4.3 4.4  HGB 8.8* 9.2*  HCT 28.3* 29.0*  MCV 100.4* 96.7  PLT 311 701   Basic Metabolic Panel: Recent Labs  Lab 02/02/18 1320 02/03/18 0436  NA 140 138  K 4.9 4.7  CL 106 106  CO2 27 26  GLUCOSE 90 92  BUN 10 9  CREATININE 0.87 0.80  CALCIUM 8.7* 8.4*   GFR: Estimated Creatinine Clearance: 39.4 mL/min (by C-G formula based on SCr of 0.8 mg/dL). Liver Function Tests: No results for input(s): AST, ALT, ALKPHOS, BILITOT, PROT, ALBUMIN in the last 168 hours. No results for input(s): LIPASE, AMYLASE in the last 168 hours. No results for input(s): AMMONIA in the last 168 hours. Coagulation Profile: Recent Labs  Lab 02/02/18 1320 02/03/18 0436  INR 1.73  1.74   Cardiac Enzymes: No results for input(s): CKTOTAL, CKMB, CKMBINDEX, TROPONINI in the last 168 hours. BNP (last 3 results) No results for input(s): PROBNP in the last 8760 hours. HbA1C: No results for input(s): HGBA1C in the last 72 hours. CBG: No results for input(s): GLUCAP in the last 168 hours. Lipid Profile: No results for input(s): CHOL, HDL, LDLCALC, TRIG, CHOLHDL, LDLDIRECT in the last 72 hours. Thyroid Function Tests: No results for input(s): TSH, T4TOTAL, FREET4, T3FREE, THYROIDAB in the last 72 hours. Anemia Panel: Recent Labs    02/03/18 0436  VITAMINB12 857   Sepsis Labs: Recent Labs  Lab 02/02/18 1153 02/02/18 1426  LATICACIDVEN 2.01* 1.28    Recent Results (from the past 240 hour(s))  MRSA PCR Screening     Status: Abnormal   Collection Time: 02/02/18 10:30 PM  Result Value Ref Range Status   MRSA by PCR POSITIVE (A) NEGATIVE Final    Comment:        The GeneXpert MRSA Assay (FDA approved for NASAL specimens only), is one component of a comprehensive MRSA colonization surveillance program. It is not intended to diagnose MRSA infection nor to guide or monitor treatment for MRSA infections. RESULT CALLED TO, READ BACK BY AND VERIFIED WITH: Rosalie Doctor RN 02/03/18 0248 JDW Performed at [facility] [address], [place], [place] [address]          Radiology Studies: Dg Chest 2 View  Result Date: 02/02/2018 CLINICAL DATA:  Cough and shortness of breath EXAM: CHEST - 2 VIEW COMPARISON:  July 05, 2017 FINDINGS: There is cardiomegaly with mild pulmonary venous hypertension. There are pleural effusions bilaterally with atelectatic change in each lung base. There is also atelectatic change in right upper lobe. No evident adenopathy. There is aortic atherosclerosis. Bones osteoporotic. There are several collapsed mid lower thoracic vertebral bodies with increase in kyphosis. IMPRESSION: Pulmonary vascular congestion. Pleural effusions  bilaterally. Areas of patchy atelectasis bilaterally but no appreciable edema or frank consolidation. There is aortic atherosclerosis. Bones osteoporotic with multiple compression fractures. Aortic Atherosclerosis (ICD10-I70.0). Electronically Signed   By: Lowella Grip III M.D.   On: 02/02/2018 11:21   Dg Ankle Complete Right  Result Date: 02/02/2018 CLINICAL DATA:  Atraumatic right foot pain EXAM: RIGHT ANKLE - COMPLETE 3+ VIEW COMPARISON:  02/09/2008 fluoroscopy. FINDINGS: Marked osteopenia. Remote distal fibula fracture with plate and screw fixation. The fracture is healed. The upper for screws are proud, especially the most proximal screw, stable. No evidence of fracture. There is lucency about the tips of the upper 4 screws, not seen on fluoroscopy. Remote and healed medial malleolus fracture. Hardware has been removed since prior. Tibiotalar joint narrowing. Marked osteopenia. IMPRESSION: 1. Remote and healed fibular fracture with plate and screw fixation. The upper screws are proud, stable from 02/09/2008 fluoroscopy. There is lucency about the tips of the upper 4 screws not seen on prior fluoroscopy, please correlate for clinical signs of infection. 2. Remote and healed medial malleolus fracture. 3. Tibiotalar osteoarthritis and prominent  osteopenia. Electronically Signed   By: Monte Fantasia M.D.   On: 02/02/2018 12:37   Dg Foot Complete Right  Result Date: 02/02/2018 CLINICAL DATA:  RIGHT entire foot pain, no fall or known injury. History of RIGHT ankle fracture. EXAM: RIGHT FOOT COMPLETE - 3+ VIEW COMPARISON:  None. FINDINGS: Severe osteopenia throughout the RIGHT foot limits characterization of osseous detail, however, there are questionable slightly displaced fractures at the distal margins of the first and second proximal phalanx. Remainder of the osseous structures of the RIGHT foot appear intact and normally aligned. Fixation hardware partially imaged at the distal fibula. IMPRESSION:  1. Questionable slightly displaced fractures at the distal aspects of the first and second proximal phalanx, not convincing, seen on only one view. Consider CT for confirmation. 2. Severe osteopenia. Electronically Signed   By: Franki Cabot M.D.   On: 02/02/2018 10:38        Scheduled Meds: . busPIRone  7.5 mg Oral BID  . Chlorhexidine Gluconate Cloth  6 each Topical Q0600  . dorzolamide  1 drop Left Eye BID  . FLUoxetine  40 mg Oral Daily  . gabapentin  600 mg Oral BID  . levothyroxine  75 mcg Oral QAC breakfast  . methocarbamol  500 mg Oral BID  . mirtazapine  7.5 mg Oral QPM  . mupirocin ointment  1 application Nasal BID  . pantoprazole  40 mg Oral Daily  . pilocarpine  1 drop Left Eye BID  . rOPINIRole  1 mg Oral QPM  . sodium chloride flush  3 mL Intravenous Q12H  . sodium chloride flush  3 mL Intravenous Q12H  . timolol  1 drop Left Eye BID  . Travoprost (BAK Free)  1 drop Left Eye QHS  . warfarin  2 mg Oral ONCE-1800  . Warfarin - Pharmacist Dosing Inpatient   Does not apply q1800   Continuous Infusions: . sodium chloride    . ceFEPime (MAXIPIME) IV    . vancomycin       LOS: 1 day    Time spent: 35 minutes    Hosie Poisson, MD [facility] Pager [phone]  If 7PM-7AM, please contact night-coverage [email or web address] Password [email or web address] 02/03/2018, 2:16 PM

## 2018-02-03 NOTE — Discharge Instructions (Addendum)
1)Air mattress as advised 2) avoid excessive Lasix/diuretic use due to risk of dehydration 3) taper off and discontinue Prozac/paroxetine due to concerns about possible appetite suppression and anticholinergic effects 4) complete antibiotics as prescribed for possible aspiration pneumonia 5) dietary modifications including nectar thickened liquids and dysphagia 2 diet as advised to reduce risk of aspiration 6) may also consider pleasure feeding (without further modifying diet)  as desired as long as you are willing to accept the risk of aspiration 7) home wound care as advised 8)Repeat INR/PT within the next 5 days, Dr Daphene Jaeger  to review results of PT/INR and adjust Coumadin dosage   Information on my medicine - Coumadin   (Warfarin)  This medication education was reviewed with me or my healthcare representative as part of my discharge preparation.  The pharmacist that spoke with me during my hospital stay was:  Jalene Mullet, [facility]  Why was Coumadin prescribed for you? Coumadin was prescribed for you because you have a blood clot or a medical condition that can cause an increased risk of forming blood clots. Blood clots can cause serious health problems by blocking the flow of blood to the heart, lung, or brain. Coumadin can prevent harmful blood clots from forming. As a reminder your indication for Coumadin is:   Stroke Prevention Because Of Atrial Fibrillation  What test will check on my response to Coumadin? While on Coumadin (warfarin) you will need to have an INR test regularly to ensure that your dose is keeping you in the desired range. The INR (international normalized ratio) number is calculated from the result of the laboratory test called prothrombin time (PT).  If an INR APPOINTMENT HAS NOT ALREADY BEEN MADE FOR YOU please schedule an appointment to have this lab work done by your health care provider within 7 days. Your INR goal is usually a number between:  2 to 3 or your provider  may give you a more narrow range like 2-2.5.  Ask your health care provider during an office visit what your goal INR is.  What  do you need to  know  About  COUMADIN? Take Coumadin (warfarin) exactly as prescribed by your healthcare provider about the same time each day.  DO NOT stop taking without talking to the doctor who prescribed the medication.  Stopping without other blood clot prevention medication to take the place of Coumadin may increase your risk of developing a new clot or stroke.  Get refills before you run out.  What do you do if you miss a dose? If you miss a dose, take it as soon as you remember on the same day then continue your regularly scheduled regimen the next day.  Do not take two doses of Coumadin at the same time.  Important Safety Information A possible side effect of Coumadin (Warfarin) is an increased risk of bleeding. You should call your healthcare provider right away if you experience any of the following: ? Bleeding from an injury or your nose that does not stop. ? Unusual colored urine (red or dark brown) or unusual colored stools (red or black). ? Unusual bruising for unknown reasons. ? A serious fall or if you hit your head (even if there is no bleeding).  Some foods or medicines interact with Coumadin (warfarin) and might alter your response to warfarin. To help avoid this: ? Eat a balanced diet, maintaining a consistent amount of Vitamin K. ? Notify your provider about major diet changes you plan to make. ? Avoid  alcohol or limit your intake to 1 drink for women and 2 drinks for men per day. (1 drink is 5 oz. wine, 12 oz. beer, or 1.5 oz. liquor.)  Make sure that ANY health care provider who prescribes medication for you knows that you are taking Coumadin (warfarin).  Also make sure the healthcare provider who is monitoring your Coumadin knows when you have started a new medication including herbals and non-prescription products.  Coumadin (Warfarin)   Major Drug Interactions  Increased Warfarin Effect Decreased Warfarin Effect  Alcohol (large quantities) Antibiotics (esp. Septra/Bactrim, Flagyl, Cipro) Amiodarone (Cordarone) Aspirin (ASA) Cimetidine (Tagamet) Megestrol (Megace) NSAIDs (ibuprofen, naproxen, etc.) Piroxicam (Feldene) Propafenone (Rythmol SR) Propranolol (Inderal) Isoniazid (INH) Posaconazole (Noxafil) Barbiturates (Phenobarbital) Carbamazepine (Tegretol) Chlordiazepoxide (Librium) Cholestyramine (Questran) Griseofulvin Oral Contraceptives Rifampin Sucralfate (Carafate) Vitamin K   Coumadin (Warfarin) Major Herbal Interactions  Increased Warfarin Effect Decreased Warfarin Effect  Garlic Ginseng Ginkgo biloba Coenzyme Q10 Green tea St. Johns wort    Coumadin (Warfarin) FOOD Interactions  Eat a consistent number of servings per week of foods HIGH in Vitamin K (1 serving =  cup)  Collards (cooked, or boiled & drained) Kale (cooked, or boiled & drained) Mustard greens (cooked, or boiled & drained) Parsley *serving size only =  cup Spinach (cooked, or boiled & drained) Swiss chard (cooked, or boiled & drained) Turnip greens (cooked, or boiled & drained)  Eat a consistent number of servings per week of foods MEDIUM-HIGH in Vitamin K (1 serving = 1 cup)  Asparagus (cooked, or boiled & drained) Broccoli (cooked, boiled & drained, or raw & chopped) Brussel sprouts (cooked, or boiled & drained) *serving size only =  cup Lettuce, raw (green leaf, endive, romaine) Spinach, raw Turnip greens, raw & chopped   These websites have more information on Coumadin (warfarin):  FailFactory.se; VeganReport.com.au;   1)Air mattress as advised 2) avoid excessive Lasix/diuretic use due to risk of dehydration 3) taper off and discontinue Prozac/paroxetine due to concerns about possible appetite suppression and anticholinergic effects 4) complete antibiotics as prescribed for possible aspiration  pneumonia 5) dietary modifications including nectar thickened liquids and dysphagia 2 diet as advised to reduce risk of aspiration 6) may also consider pleasure feeding (without further modifying diet)  as desired as long as you are willing to accept the risk of aspiration 7) home wound care as advised 8)Repeat INR/PT within the next 5 days, Dr Daphene Jaeger  to review results of PT/INR and adjust Coumadin dosage

## 2018-02-04 LAB — FOLATE RBC
FOLATE, HEMOLYSATE: 373 ng/mL
Folate, RBC: 1295 ng/mL (ref >498)
HEMATOCRIT: 28.8 % — AB (ref 34.0–46.6)

## 2018-02-04 LAB — TSH: TSH: 74.83 u[IU]/mL — ABNORMAL HIGH (ref 0.350–4.500)

## 2018-02-04 LAB — PROTIME-INR
INR: 1.37
Prothrombin Time: 16.8 seconds — ABNORMAL HIGH (ref 11.4–15.2)

## 2018-02-04 MED ORDER — ADULT MULTIVITAMIN W/MINERALS CH
1.0000 | ORAL_TABLET | Freq: Every day | ORAL | Status: DC
  Administered 2018-02-04 – 2018-02-11 (×8): 1 via ORAL
  Filled 2018-02-04 (×8): qty 1

## 2018-02-04 MED ORDER — WARFARIN SODIUM 2 MG PO TABS
2.0000 mg | ORAL_TABLET | Freq: Once | ORAL | Status: AC
  Administered 2018-02-04: 2 mg via ORAL
  Filled 2018-02-04: qty 1

## 2018-02-04 MED ORDER — ENSURE ENLIVE PO LIQD
237.0000 mL | Freq: Three times a day (TID) | ORAL | Status: DC
  Administered 2018-02-04 – 2018-02-11 (×17): 237 mL via ORAL

## 2018-02-04 NOTE — Evaluation (Signed)
Occupational Therapy Evaluation Patient Details Name: MERCEDEZ BOULE MRN: [id number] DOB: October 01, 1926 Today's Date: 02/04/2018    History of Present Illness Pt is an 84 y/o female admitted secondary to R ankle wound infection. PMH includes COPD on 3L of oxygen, R ankle ORIF, CHF, a fib, anxiety, and glaucoma.    Clinical Impression   Pt seen supine in bed and needing hand over hand assistance for grooming tasks with pt reporting, " I'm too sleepy" when attempting bed mobility. Per RN report and PT evaluation with family present pt likely at baseline level for self care tasks. Pt was nonambulatory PTA with family utilizing hoyer lift for transfer. Self care being performed from bed level with total care per report. Pt with no need of acute OT intervention at this time. OT signing off. Thank you for referral.     Follow Up Recommendations  Supervision/Assistance - 24 hour;Other (comment);No OT follow up    Equipment Recommendations  None recommended by OT    Recommendations for Other Services Other (comment)(none at this time)     Precautions / Restrictions Precautions Precautions: Fall Restrictions Weight Bearing Restrictions: Yes RLE Weight Bearing: Non weight bearing      Mobility Bed Mobility      General bed mobility comments: attempted bed mobility task but pt very resistive to movement. Pt reports, " I'm too tired for this."  Transfers     General transfer comment: Nonambulatory at baseline.         ADL either performed or assessed with clinical judgement   ADL Overall ADL's : At baseline       General ADL Comments: likely at baseline secondary to RN report and PT evaluation where family was present. Pt has been receiving assistance from self care from bed level since documented 2018 hospital admission.                   Pertinent Vitals/Pain Pain Assessment: Faces Faces Pain Scale: No hurt     Hand Dominance Right   Extremity/Trunk Assessment  Upper Extremity Assessment RUE Deficits / Details: Limited ROM at R shoulder and resistive to movement. Unsure if due to pain or self limitation       Communication Communication Communication: HOH   Cognition Arousal/Alertness: Awake/alert Behavior During Therapy: WFL for tasks assessed/performed Overall Cognitive Status: Within Functional Limits for tasks assessed                     Home Living Family/patient expects to be discharged to:: Private residence Living Arrangements: Children;Other (Comment) Available Help at Discharge: Family;Available 24 hours/day Type of Home: House Home Access: Ramped entrance     Home Layout: One level     Bathroom Shower/Tub: Occupational psychologist: Standard     Home Equipment: Youth worker - 2 wheels;Shower seat - built in;Bedside commode;Hospital bed;Other (comment)(hoyer lift and pad)          Prior Functioning/Environment Level of Independence: Needs assistance  Gait / Transfers Assistance Needed: Family reports pt had mostly been in the bed secondary to wounds, however, would transfer to lift chair using hoyer lift.  ADL's / Homemaking Assistance Needed: Family assists with dressing, sponge bathing, toileting, etc                      OT Goals(Current goals can be found in the care plan section) Acute Rehab OT Goals Patient Stated Goal: none stated  OT Frequency:  AM-PAC PT "6 Clicks" Daily Activity     Outcome Measure Help from another person eating meals?: Total Help from another person taking care of personal grooming?: Total Help from another person toileting, which includes using toliet, bedpan, or urinal?: Total Help from another person bathing (including washing, rinsing, drying)?: Total Help from another person to put on and taking off regular upper body clothing?: Total   6 Click Score: 5   End of Session Equipment Utilized During Treatment: Oxygen Nurse  Communication: Precautions;Other (comment)  Activity Tolerance: Patient limited by fatigue Patient left: in bed;with call bell/phone within reach                   Time: 0900-0915 OT Time Calculation (min): 15 min Charges:  OT General Charges $OT Visit: 1 Visit OT Evaluation $OT Eval High Complexity: 1 High   Georgeana Oertel P, MS, OTR/L 02/04/2018, 9:25 AM

## 2018-02-04 NOTE — Care Management Note (Addendum)
Case Management Note  Patient Details  Name: [name] MRN: [id number] Date of Birth: [birth year]  Subjective/Objective:   Wound infection complication hardware               Action/Plan: NCM spoke to pt's dtr, Baker Janus and son, [name]. Pt is active with [facility] for nursing. She has hospital bed, hoyer lift, motorized scooter, shooter, lift chair, and bedside commode. Dtr states they have special mattress on order. She is not sure the type of mattress. Pt has oxygen with [facility]  # [phone] . She will call NCM with number for DME company. States the mattress was order through them also. Family is wanting another [facility] agency. States they will follow up with [facility]. Provided dtr with [facility] list. She will call [facility] to follow up. Will need HH RN, PT, OT, aide and SW with [facility].   Daughter is willing to change to [facility] for [facility]. Contacted [employer] rep with new referral for [facility] program. Dtr will contact NCM to confirm she cancel with [facility].   02/06/2018 4:35 pm Received call from dtr and states she is waiting for Palliative to speak to her and brother about care that will be needed at home. She is requesting a air mattress for her pt's hospital bed if dc home.   Expected Discharge Date:                 Expected Discharge Plan:  [place]  In-House Referral:  NA  Discharge planning Services  CM Consult  Post Acute Care Choice:  Home Health Choice offered to:  Adult Children  DME Arranged:   DME Agency:    HH Arranged:  PT, RN, OT, Nurse's Aide [place] Agency:    Status of Service:  In process, will continue to follow  If discussed at Long Length of Stay Meetings, dates discussed:    Additional Comments:  Erenest Rasher, RN 02/04/2018, 7:09 PM

## 2018-02-04 NOTE — Progress Notes (Signed)
Nutrition Follow-up  DOCUMENTATION CODES:   Severe malnutrition in context of chronic illness  INTERVENTION:   -Increase Ensure Enlive po to TID, each supplement provides 350 kcal and 20 grams of protein -MVI with minerals daily -Magic Cup TID with meals  NUTRITION DIAGNOSIS:   Severe Malnutrition related to chronic illness(COPD, CHF, Nonhealing leg wound) as evidenced by severe fat depletion, severe muscle depletion.  Ongoing  GOAL:   Patient will meet greater than or equal to 90% of their needs  Progressing  MONITOR:   PO intake, Supplement acceptance, Weight trends, I & O's  REASON FOR ASSESSMENT:   Consult Assessment of nutrition requirement/status  ASSESSMENT:   Patient with PMH of hypothyroidism, CHF, COPD and nonhealing right leg wound presents with severe leg pain related to nonhealing leg wound   RD consulted to assessment nutrition requirements and status. Noted RD evaluated pt yesterday; refer to RD note dated 02/03/18 for further details.   Reviewed orthopedics note; pt with chronic rt ankle ulcer with exposed bone and hardware. Pt and family have opted for conservative measures (IV antibiotics). Palliative care consult pending.   Pt sleeping soundly at time of visit. RD did not disturb. Intake remains poor; meal completion 15%. Pt consuming Ensure supplements; per daughter, must be mixed with coffee in order for pt to accept.   Reviewed wt hx; noted pt has experienced a 20% wt loss over the past 10 months, which is concerning given malnutrition and prolonged poor oral intake. If pt opts for comfort/palliative measures, anticipate further nutritional decline.   Labs reviewed.   Diet Order:   Diet Order           Diet Heart Room service appropriate? Yes; Fluid consistency: Thin  Diet effective now          EDUCATION NEEDS:   No education needs have been identified at this time  Skin:  Skin Assessment: Skin Integrity Issues: Skin Integrity Issues::  Unstageable, Stage II Stage II: sacrum Stage III: n/a Unstageable: rt heel Other: non pressure wound to rt ankle with hardware exposed  Last BM:  02/04/18  Height:   Ht Readings from Last 1 Encounters:  02/02/18 5\' 7"  (1.702 m)    Weight:   Wt Readings from Last 1 Encounters:  02/04/18 111 lb (50.3 kg)    Ideal Body Weight:  61.36 kg  BMI:  Body mass index is 17.39 kg/m.  Estimated Nutritional Needs:   Kcal:  1400-1700 calories  Protein:  78-89 grams (1.5-1.7g/kg)  Fluid:  >1.5L    Giann Obara A. Jimmye Norman, RD, LDN, CDE Pager: [phone] After hours Pager: [phone]

## 2018-02-04 NOTE — Progress Notes (Signed)
PROGRESS NOTE    [name]  EXH:[id number] DOB: June 10, 1927 DOA: 02/02/2018 PCP: Tamsen Roers, MD    Brief Narrative:  [name] is a 84 y.o. female with medical history significant for hypothyroidism, anxiety, agitation, chronic systolic CHF, atrial fibrillation on warfarin, and COPD with chronic hypoxic respiratory failure, and nonhealing right leg wound now presenting to the emergency department for evaluation of severe leg pains and nonhealing wound.    Assessment & Plan:   Principal Problem:   Wound infection complicating hardware ([place]) Active Problems:   Atrial fibrillation (HCC)   Hypothyroidism   COPD (chronic obstructive pulmonary disease) (HCC)   Chronic systolic CHF (congestive heart failure) (HCC)   Chronic respiratory failure with hypoxia (HCC)   Macrocytic anemia   Anxiety   Agitation   Anticoagulated on Coumadin   Chronic nonhealing right leg wound with exposed hardware: Dr. Doran Durand was consulted recommended conservative management with IV antibiotics at this time. Patient is in constant pain and required pain medications before the dressing and during the day. Palliative care consulted for goals of care. Blood cultures ordered and are pending and negative so far.  Will request ID to give recommendations on duration of antibiotics after blood culture final report.     COPD No wheezing heard, resume albuterol inhaler as needed. Pt is on 3 lit of [place] oxygen at baseline.    Chronic systolic heart failure she appears to be compensated.   Chronic respiratory failure with hypoxia probably secondary to COPD At baseline she uses on 3 L of nasal cannula oxygen.   Cardizem Resume Synthroid at 75 MCG daily.   Chronic atrial fibrillation Controlled.  On Coumadin for anticoagulation. INR is sub therapeutic.   Sacral decubitus pressure ulcer stage 1 and right heel ulcer.  Wound care consulted.    DVT prophylaxis: scd's Code Status: Full  code Family Communication: Daughter at bedside Disposition Plan: Pending evaluation by palliative care Consultants:   Orthopedics Dr. Doran Durand   Procedures: None  Antimicrobials: IV vancomycin and cefepime since admission  Subjective: Leg Pain controlled with pain meds.  No chest pain, sob or nausea, vomiting or abdominal pain.   Objective: Vitals:   02/03/18 2112 02/04/18 0158 02/04/18 0432 02/04/18 0549  BP: 116/81 135/84  (!) 127/97  Pulse: 73 74  75  Resp: 17 (!) 21  (!) 23  Temp: 97.7 F (36.5 C) 98.1 F (36.7 C) (!) 97.5 F (36.4 C) (!) 97.5 F (36.4 C)  TempSrc: Oral Oral Oral Oral  SpO2: 100% 100%  100%  Weight:   50.3 kg (111 lb)   Height:        Intake/Output Summary (Last 24 hours) at 02/04/2018 0757 Last data filed at 02/04/2018 0600 Gross per 24 hour  Intake 540 ml  Output 300 ml  Net 240 ml   Filed Weights   02/02/18 2100 02/03/18 0500 02/04/18 0432  Weight: 57.5 kg (126 lb 12.2 oz) 52.3 kg (115 lb 4.8 oz) 50.3 kg (111 lb)    Examination:  General exam: Appears calm  And is on 2lit of  oxygen.  Respiratory system: Clear to auscultation. Respiratory effort normal. No wheezing or rhonchi.  Cardiovascular system: S1 & S2 heard, irregular. No JVD, murmurs, Gastrointestinal system: Abdomen is soft non tender non distended bowel sounds good.  Central nervous system: alert and oriented to person only.  Extremities: pedal edemA, right non healing wound with exposed hardware on the right leg.  With right heel pressure ulcer.  Skin: sacral pressure ulcer stage 1 Psychiatry:  Mood & affect appropriate.     Data Reviewed: I have personally reviewed following labs and imaging studies  CBC: Recent Labs  Lab 02/02/18 1320 02/03/18 0436  WBC 5.9 5.5  NEUTROABS 4.3 4.4  HGB 8.8* 9.2*  HCT 28.3* 29.0*  MCV 100.4* 96.7  PLT 311 144   Basic Metabolic Panel: Recent Labs  Lab 02/02/18 1320 02/03/18 0436  NA 140 138  K 4.9 4.7  CL 106 106  CO2 27  26  GLUCOSE 90 92  BUN 10 9  CREATININE 0.87 0.80  CALCIUM 8.7* 8.4*   GFR: Estimated Creatinine Clearance: 37.9 mL/min (by C-G formula based on SCr of 0.8 mg/dL). Liver Function Tests: No results for input(s): AST, ALT, ALKPHOS, BILITOT, PROT, ALBUMIN in the last 168 hours. No results for input(s): LIPASE, AMYLASE in the last 168 hours. No results for input(s): AMMONIA in the last 168 hours. Coagulation Profile: Recent Labs  Lab 02/02/18 1320 02/03/18 0436 02/04/18 0525  INR 1.73 1.74 1.37   Cardiac Enzymes: No results for input(s): CKTOTAL, CKMB, CKMBINDEX, TROPONINI in the last 168 hours. BNP (last 3 results) No results for input(s): PROBNP in the last 8760 hours. HbA1C: No results for input(s): HGBA1C in the last 72 hours. CBG: No results for input(s): GLUCAP in the last 168 hours. Lipid Profile: No results for input(s): CHOL, HDL, LDLCALC, TRIG, CHOLHDL, LDLDIRECT in the last 72 hours. Thyroid Function Tests: No results for input(s): TSH, T4TOTAL, FREET4, T3FREE, THYROIDAB in the last 72 hours. Anemia Panel: Recent Labs    02/03/18 0436  VITAMINB12 857   Sepsis Labs: Recent Labs  Lab 02/02/18 1153 02/02/18 1426  LATICACIDVEN 2.01* 1.28    Recent Results (from the past 240 hour(s))  Blood culture (routine x 2)     Status: None (Preliminary result)   Collection Time: 02/02/18 11:53 AM  Result Value Ref Range Status   Specimen Description BLOOD RIGHT WRIST  Final   Special Requests   Final    BOTTLES DRAWN AEROBIC AND ANAEROBIC Blood Culture results may not be optimal due to an inadequate volume of blood received in culture bottles   Culture   Final    NO GROWTH 1 DAY Performed at [facility], [place] [address], [place], [place] [address]    Report Status PENDING  Incomplete  Blood culture (routine x 2)     Status: None (Preliminary result)   Collection Time: 02/02/18  1:30 PM  Result Value Ref Range Status   Specimen Description BLOOD LEFT  ANTECUBITAL  Final   Special Requests   Final    BOTTLES DRAWN AEROBIC AND ANAEROBIC Blood Culture adequate volume   Culture   Final    NO GROWTH 1 DAY Performed at [facility], [place] [address], [place], [place] [address]    Report Status PENDING  Incomplete  MRSA PCR Screening     Status: Abnormal   Collection Time: 02/02/18 10:30 PM  Result Value Ref Range Status   MRSA by PCR POSITIVE (A) NEGATIVE Final    Comment:        The GeneXpert MRSA Assay (FDA approved for NASAL specimens only), is one component of a comprehensive MRSA colonization surveillance program. It is not intended to diagnose MRSA infection nor to guide or monitor treatment for MRSA infections. RESULT CALLED TO, READ BACK BY AND VERIFIED WITH: Rosalie Doctor RN 02/03/18 0248 JDW Performed at [facility],  [address], [place], [place] [address]          Radiology Studies: Dg Chest 2 View  Result Date: 02/02/2018 CLINICAL DATA:  Cough and shortness of breath EXAM: CHEST - 2 VIEW COMPARISON:  July 05, 2017 FINDINGS: There is cardiomegaly with mild pulmonary venous hypertension. There are pleural effusions bilaterally with atelectatic change in each lung base. There is also atelectatic change in right upper lobe. No evident adenopathy. There is aortic atherosclerosis. Bones osteoporotic. There are several collapsed mid lower thoracic vertebral bodies with increase in kyphosis. IMPRESSION: Pulmonary vascular congestion. Pleural effusions bilaterally. Areas of patchy atelectasis bilaterally but no appreciable edema or frank consolidation. There is aortic atherosclerosis. Bones osteoporotic with multiple compression fractures. Aortic Atherosclerosis (ICD10-I70.0). Electronically Signed   By: Lowella Grip III M.D.   On: 02/02/2018 11:21   Dg Ankle Complete Right  Result Date: 02/02/2018 CLINICAL DATA:  Atraumatic right foot pain EXAM: RIGHT ANKLE - COMPLETE 3+ VIEW COMPARISON:  02/09/2008  fluoroscopy. FINDINGS: Marked osteopenia. Remote distal fibula fracture with plate and screw fixation. The fracture is healed. The upper for screws are proud, especially the most proximal screw, stable. No evidence of fracture. There is lucency about the tips of the upper 4 screws, not seen on fluoroscopy. Remote and healed medial malleolus fracture. Hardware has been removed since prior. Tibiotalar joint narrowing. Marked osteopenia. IMPRESSION: 1. Remote and healed fibular fracture with plate and screw fixation. The upper screws are proud, stable from 02/09/2008 fluoroscopy. There is lucency about the tips of the upper 4 screws not seen on prior fluoroscopy, please correlate for clinical signs of infection. 2. Remote and healed medial malleolus fracture. 3. Tibiotalar osteoarthritis and prominent osteopenia. Electronically Signed   By: Monte Fantasia M.D.   On: 02/02/2018 12:37   Dg Foot Complete Right  Result Date: 02/02/2018 CLINICAL DATA:  RIGHT entire foot pain, no fall or known injury. History of RIGHT ankle fracture. EXAM: RIGHT FOOT COMPLETE - 3+ VIEW COMPARISON:  None. FINDINGS: Severe osteopenia throughout the RIGHT foot limits characterization of osseous detail, however, there are questionable slightly displaced fractures at the distal margins of the first and second proximal phalanx. Remainder of the osseous structures of the RIGHT foot appear intact and normally aligned. Fixation hardware partially imaged at the distal fibula. IMPRESSION: 1. Questionable slightly displaced fractures at the distal aspects of the first and second proximal phalanx, not convincing, seen on only one view. Consider CT for confirmation. 2. Severe osteopenia. Electronically Signed   By: Franki Cabot M.D.   On: 02/02/2018 10:38        Scheduled Meds: . busPIRone  7.5 mg Oral BID  . Chlorhexidine Gluconate Cloth  6 each Topical Q0600  . dorzolamide  1 drop Left Eye BID  . feeding supplement (ENSURE ENLIVE)  237  mL Oral BID BM  . FLUoxetine  40 mg Oral Daily  . gabapentin  600 mg Oral BID  . levothyroxine  75 mcg Oral QAC breakfast  . methocarbamol  500 mg Oral BID  . mirtazapine  7.5 mg Oral QPM  . mupirocin ointment  1 application Nasal BID  . pantoprazole  40 mg Oral Daily  . pilocarpine  1 drop Left Eye BID  . rOPINIRole  1 mg Oral QPM  . sodium chloride flush  3 mL Intravenous Q12H  . sodium chloride flush  3 mL Intravenous Q12H  . timolol  1 drop Left Eye BID  . Travoprost (BAK Free)  1  drop Left Eye QHS  . Warfarin - Pharmacist Dosing Inpatient   Does not apply q1800   Continuous Infusions: . sodium chloride    . ceFEPime (MAXIPIME) IV Stopped (02/03/18 2220)  . vancomycin Stopped (02/04/18 0200)     LOS: 2 days    Time spent: 35 minutes    Hosie Poisson, MD [facility] Pager [phone]  If 7PM-7AM, please contact night-coverage [email or web address] Password [facility] 02/04/2018, 7:57 AM

## 2018-02-04 NOTE — Progress Notes (Signed)
ANTICOAGULATION CONSULT NOTE - Initial Consult  Pharmacy Consult for warfarin Indication: atrial fibrillation  No Known Allergies  Patient Measurements: Height: 5\' 7"  (170.2 cm) Weight: 111 lb (50.3 kg) IBW/kg (Calculated) : 61.6  Vital Signs: Temp: 97.5 F (36.4 C) (06/21 0549) Temp Source: Oral (06/21 0549) BP: 127/97 (06/21 0549) Pulse Rate: 75 (06/21 0549)  Labs: Recent Labs    02/02/18 1320 02/03/18 0436 02/04/18 0525  HGB 8.8* 9.2*  --   HCT 28.3* 29.0*  --   PLT 311 334  --   LABPROT 20.1* 20.2* 16.8*  INR 1.73 1.74 1.37  CREATININE 0.87 0.80  --     Estimated Creatinine Clearance: 37.9 mL/min (by C-G formula based on SCr of 0.8 mg/dL).   Medical History: Past Medical History:  Diagnosis Date  . Aortic atherosclerosis ([place]) 07/07/2017  . Atrial fibrillation (HCC)    chronic Coumadin.   . CHF (congestive heart failure) ([place])   . COPD (chronic obstructive pulmonary disease) ([place])   . Glaucoma   . Hypothyroidism   . Neuropathy    PERIPHERAL  . Osteoarthritis   . Osteoporosis   . Trimalleolar fracture    LEFT TRIMALLEOLAR FRACTURE, DISLOCATION WITH OBLIQUE FRACTURE OF THE DISTAL FIBULAR DIAPHYSIS, AND NOTED BEING MILDLY COMMINUTED   Assessment: 75 yof presented to the ED with foot pain. She is on chronic coumadin for history of afib. INR is subtherapeutic at 1.37. No bleeding noted. Patient did not receive dose on 6/19 due to sedation.   PTA dose 1mg  daily  Goal of Therapy:  INR 2-3 Monitor platelets by anticoagulation protocol: Yes   Plan:  Warfarin 2mg  PO x 1 tonight Daily INR  Renny Gunnarson A. [name], PharmD, [place] Pager: [phone]   02/04/2018,8:20 AM

## 2018-02-05 DIAGNOSIS — S82891S Other fracture of right lower leg, sequela: Secondary | ICD-10-CM

## 2018-02-05 DIAGNOSIS — Z8619 Personal history of other infectious and parasitic diseases: Secondary | ICD-10-CM

## 2018-02-05 DIAGNOSIS — T8469XA Infection and inflammatory reaction due to internal fixation device of other site, initial encounter: Secondary | ICD-10-CM

## 2018-02-05 DIAGNOSIS — X58XXXS Exposure to other specified factors, sequela: Secondary | ICD-10-CM

## 2018-02-05 DIAGNOSIS — Y793 Surgical instruments, materials and orthopedic devices (including sutures) associated with adverse incidents: Secondary | ICD-10-CM

## 2018-02-05 LAB — CBC
HEMATOCRIT: 28.8 % — AB (ref 36.0–46.0)
HEMOGLOBIN: 9 g/dL — AB (ref 12.0–15.0)
MCH: 30.6 pg (ref 26.0–34.0)
MCHC: 31.3 g/dL (ref 30.0–36.0)
MCV: 98 fL (ref 78.0–100.0)
Platelets: 323 10*3/uL (ref 150–400)
RBC: 2.94 MIL/uL — ABNORMAL LOW (ref 3.87–5.11)
RDW: 17.3 % — ABNORMAL HIGH (ref 11.5–15.5)
WBC: 5.4 10*3/uL (ref 4.0–10.5)

## 2018-02-05 LAB — BASIC METABOLIC PANEL
ANION GAP: 4 — AB (ref 5–15)
BUN: 12 mg/dL (ref 6–20)
CHLORIDE: 108 mmol/L (ref 101–111)
CO2: 28 mmol/L (ref 22–32)
CREATININE: 0.86 mg/dL (ref 0.44–1.00)
Calcium: 8.8 mg/dL — ABNORMAL LOW (ref 8.9–10.3)
GFR calc non Af Amer: 58 mL/min — ABNORMAL LOW (ref >60)
Glucose, Bld: 122 mg/dL — ABNORMAL HIGH (ref 65–99)
Potassium: 5.4 mmol/L — ABNORMAL HIGH (ref 3.5–5.1)
Sodium: 140 mmol/L (ref 135–145)

## 2018-02-05 LAB — PROTIME-INR
INR: 1.89
Prothrombin Time: 21.5 seconds — ABNORMAL HIGH (ref 11.4–15.2)

## 2018-02-05 MED ORDER — WARFARIN SODIUM 2 MG PO TABS
2.0000 mg | ORAL_TABLET | Freq: Once | ORAL | Status: AC
  Administered 2018-02-05: 2 mg via ORAL
  Filled 2018-02-05: qty 1

## 2018-02-05 NOTE — Progress Notes (Signed)
ANTICOAGULATION CONSULT NOTE - Initial Consult  Pharmacy Consult for warfarin Indication: atrial fibrillation  No Known Allergies  Patient Measurements: Height: 5\' 7"  (170.2 cm) Weight: 122 lb (55.3 kg) IBW/kg (Calculated) : 61.6  Vital Signs: Temp: 97.4 F (36.3 C) (06/22 0805) Temp Source: Oral (06/22 0805) BP: 113/76 (06/22 0805) Pulse Rate: 65 (06/22 0805)  Labs: Recent Labs    02/02/18 1320 02/03/18 0436 02/03/18 0437 02/04/18 0525 02/05/18 0701  HGB 8.8* 9.2*  --   --   --   HCT 28.3* 29.0* 28.8*  --   --   PLT 311 334  --   --   --   LABPROT 20.1* 20.2*  --  16.8* 21.5*  INR 1.73 1.74  --  1.37 1.89  CREATININE 0.87 0.80  --   --   --     Estimated Creatinine Clearance: 41.6 mL/min (by C-G formula based on SCr of 0.8 mg/dL).   Medical History: Past Medical History:  Diagnosis Date  . Aortic atherosclerosis ([place]) 07/07/2017  . Atrial fibrillation (HCC)    chronic Coumadin.   . CHF (congestive heart failure) ([place])   . COPD (chronic obstructive pulmonary disease) ([place])   . Glaucoma   . Hypothyroidism   . Neuropathy    PERIPHERAL  . Osteoarthritis   . Osteoporosis   . Trimalleolar fracture    LEFT TRIMALLEOLAR FRACTURE, DISLOCATION WITH OBLIQUE FRACTURE OF THE DISTAL FIBULAR DIAPHYSIS, AND NOTED BEING MILDLY COMMINUTED   Assessment: 16 yof presented to the ED with foot pain. She is on chronic warfarin for history of afib. INR slightly below goal range today at 1.89. No bleeding noted. Hemoglobin low, stable and platelets WNL.   Patient did not receive dose on 6/19 due to sedation.   PTA dose 1mg  daily  Goal of Therapy:  INR 2-3 Monitor platelets by anticoagulation protocol: Yes   Plan:  Warfarin 2mg  PO x 1 tonight Daily INR/ CBC  Monitor for S/sx of bleeding  Jalene Mullet, Pharm.D. PGY1 Pharmacy Resident 02/05/2018 8:18 AM Please check AMION for all [place] numbers

## 2018-02-05 NOTE — Progress Notes (Signed)
Pharmacy Antibiotic Note  [name] is a 84 y.o. female admitted on 02/02/2018 with wound infection (chronic nonhealing wound with exposed hardware). Pharmacy has been consulted for vancomycin and cefepime dosing. Pt remains afebrile and WBC is WNL.   Plan: Vancomycin 500mg  IV Q24H Cefepime 1gm IV Q24H F/u renal fxn, C&S, clinical status and trough at [facility] Follow culture results and plan for LOT   Height: 5\' 7"  (170.2 cm) Weight: 122 lb (55.3 kg) IBW/kg (Calculated) : 61.6  Temp (24hrs), Avg:97.5 F (36.4 C), Min:97.4 F (36.3 C), Max:97.7 F (36.5 C)  Recent Labs  Lab 02/02/18 1153 02/02/18 1320 02/02/18 1426 02/03/18 0436  WBC  --  5.9  --  5.5  CREATININE  --  0.87  --  0.80  LATICACIDVEN 2.01*  --  1.28  --     Estimated Creatinine Clearance: 41.6 mL/min (by C-G formula based on SCr of 0.8 mg/dL).    No Known Allergies  Antimicrobials this admission: Vanc 6/19>> Cefepime 6/19>>  Dose adjustments this admission: N/A  Microbiology results: 6/19 MRSA PCR: positive  6/19 Bcx: no growth x 2 days   Jalene Mullet, Pharm.D. PGY1 Pharmacy Resident 02/05/2018 8:16 AM Please check AMION for all [place] numbers

## 2018-02-05 NOTE — Progress Notes (Addendum)
PROGRESS NOTE    [name]  LNL:[id number] DOB: 02/06/1927 DOA: 02/02/2018 PCP: Tamsen Roers, MD    Brief Narrative:  [name] is a 84 y.o. female with medical history significant for hypothyroidism, anxiety, agitation, chronic systolic CHF, atrial fibrillation on warfarin, and COPD with chronic hypoxic respiratory failure, and nonhealing right leg wound now presenting to the emergency department for evaluation of severe leg pains and nonhealing wound.    Assessment & Plan:   Principal Problem:   Wound infection complicating hardware ([place]) Active Problems:   Atrial fibrillation (HCC)   Hypothyroidism   COPD (chronic obstructive pulmonary disease) (HCC)   Chronic systolic CHF (congestive heart failure) (HCC)   Chronic respiratory failure with hypoxia (HCC)   Macrocytic anemia   Anxiety   Agitation   Anticoagulated on Coumadin   Chronic nonhealing right leg wound with exposed hardware: Dr. Doran Durand was consulted recommended conservative management with IV antibiotics at this time. Patient is in constant pain and required pain medications. Palliative care consulted for goals of care. Blood cultures ordered and are pending,negative so far.  Currently the would area does not show any signs of cellulitis and no purulent discharge.  Will d/c IV antibiotics and watch her for 2 to 3 days and monitor off antibiotics.  If stable will discharge home with palliative care services vs home hospice if that's what patient's family want.    COPD No wheezing heard, resume albuterol inhaler as needed.   Chronic systolic heart failure she appears to be compensated.   Chronic respiratory failure with hypoxia probably secondary to COPD At baseline she uses on 3 L of nasal cannula oxygen.   Hypothyroidism: Resume Synthroid at 75 MCG daily.   Chronic atrial fibrillation Controlled.  On Coumadin for anticoagulation.   Hyperkalemia: Pt having diarrhea, wound not add any  kayexalate.  Will repeat K in am.     DVT prophylaxis: scd's Code Status: Full code Family Communication: Daughter at bedside Disposition Plan: Pending evaluation by palliative care Consultants:   Orthopedics Dr. Doran Durand   Procedures: None  Antimicrobials: IV vancomycin and cefepime discontinued.   Subjective: No new complaints.   Objective: Vitals:   02/05/18 0805 02/05/18 1200 02/05/18 1203 02/05/18 1556  BP: 113/76 105/60  123/79  Pulse: 65 77  85  Resp: 15 (!) 26  19  Temp: (!) 97.4 F (36.3 C)  (!) 97.4 F (36.3 C) 98.1 F (36.7 C)  TempSrc: Oral  Oral   SpO2: 97% 99%  100%  Weight:      Height:        Intake/Output Summary (Last 24 hours) at 02/05/2018 1714 Last data filed at 02/05/2018 0900 Gross per 24 hour  Intake 75 ml  Output 900 ml  Net -825 ml   Filed Weights   02/03/18 0500 02/04/18 0432 02/05/18 0300  Weight: 52.3 kg (115 lb 4.8 oz) 50.3 kg (111 lb) 55.3 kg (122 lb)    Examination:  General exam: Appears calm and comfortable on 3lit of [place] oxygen. Not in distress Respiratory system: Clear to auscultation. Respiratory effort normal. No wheezing or rhonchi.  Cardiovascular system: S1 & S2 heard, irregular. No JVD, murmurs, Gastrointestinal system: Abdomen is soft NT ND BS+ Central nervous system: alert and oriented to person only.  Extremities: pedal edemA, right non healing wound with exposed hardware on the right leg.  With right heel pressure ulcer.  Skin: sacral pressure ulcer stage 1 Psychiatry:  Mood & affect appropriate.  Data Reviewed: I have personally reviewed following labs and imaging studies  CBC: Recent Labs  Lab 02/02/18 1320 02/03/18 0436 02/03/18 0437 02/05/18 1157  WBC 5.9 5.5  --  5.4  NEUTROABS 4.3 4.4  --   --   HGB 8.8* 9.2*  --  9.0*  HCT 28.3* 29.0* 28.8* 28.8*  MCV 100.4* 96.7  --  98.0  PLT 311 334  --  509   Basic Metabolic Panel: Recent Labs  Lab 02/02/18 1320 02/03/18 0436 02/05/18 1157  NA  140 138 140  K 4.9 4.7 5.4*  CL 106 106 108  CO2 27 26 28   GLUCOSE 90 92 122*  BUN 10 9 12   CREATININE 0.87 0.80 0.86  CALCIUM 8.7* 8.4* 8.8*   GFR: Estimated Creatinine Clearance: 38.7 mL/min (by C-G formula based on SCr of 0.86 mg/dL). Liver Function Tests: No results for input(s): AST, ALT, ALKPHOS, BILITOT, PROT, ALBUMIN in the last 168 hours. No results for input(s): LIPASE, AMYLASE in the last 168 hours. No results for input(s): AMMONIA in the last 168 hours. Coagulation Profile: Recent Labs  Lab 02/02/18 1320 02/03/18 0436 02/04/18 0525 02/05/18 0701  INR 1.73 1.74 1.37 1.89   Cardiac Enzymes: No results for input(s): CKTOTAL, CKMB, CKMBINDEX, TROPONINI in the last 168 hours. BNP (last 3 results) No results for input(s): PROBNP in the last 8760 hours. HbA1C: No results for input(s): HGBA1C in the last 72 hours. CBG: No results for input(s): GLUCAP in the last 168 hours. Lipid Profile: No results for input(s): CHOL, HDL, LDLCALC, TRIG, CHOLHDL, LDLDIRECT in the last 72 hours. Thyroid Function Tests: Recent Labs    02/03/18 0436  TSH 74.830*   Anemia Panel: Recent Labs    02/03/18 0436  VITAMINB12 857   Sepsis Labs: Recent Labs  Lab 02/02/18 1153 02/02/18 1426  LATICACIDVEN 2.01* 1.28    Recent Results (from the past 240 hour(s))  Blood culture (routine x 2)     Status: None (Preliminary result)   Collection Time: 02/02/18 11:53 AM  Result Value Ref Range Status   Specimen Description BLOOD RIGHT WRIST  Final   Special Requests   Final    BOTTLES DRAWN AEROBIC AND ANAEROBIC Blood Culture results may not be optimal due to an inadequate volume of blood received in culture bottles   Culture   Final    NO GROWTH 3 DAYS Performed at [facility], [place] [address] [place],  [address]    Report Status PENDING  Incomplete  Blood culture (routine x 2)     Status: None (Preliminary result)   Collection Time: 02/02/18  1:30 PM  Result Value  Ref Range Status   Specimen Description BLOOD LEFT ANTECUBITAL  Final   Special Requests   Final    BOTTLES DRAWN AEROBIC AND ANAEROBIC Blood Culture adequate volume   Culture   Final    NO GROWTH 3 DAYS Performed at [facility], [place] [address] [place],  [address]    Report Status PENDING  Incomplete  MRSA PCR Screening     Status: Abnormal   Collection Time: 02/02/18 10:30 PM  Result Value Ref Range Status   MRSA by PCR POSITIVE (A) NEGATIVE Final    Comment:        The GeneXpert MRSA Assay (FDA approved for NASAL specimens only), is one component of a comprehensive MRSA colonization surveillance program. It is not intended to diagnose MRSA infection nor to guide or monitor treatment for  MRSA infections. RESULT CALLED TO, READ BACK BY AND VERIFIED WITH: Rosalie Doctor RN 02/03/18 0248 JDW Performed at [facility] [address] [place], [place] [address]          Radiology Studies: No results found.      Scheduled Meds: . busPIRone  7.5 mg Oral BID  . Chlorhexidine Gluconate Cloth  6 each Topical Q0600  . dorzolamide  1 drop Left Eye BID  . feeding supplement (ENSURE ENLIVE)  237 mL Oral TID BM  . FLUoxetine  40 mg Oral Daily  . gabapentin  600 mg Oral BID  . levothyroxine  75 mcg Oral QAC breakfast  . methocarbamol  500 mg Oral BID  . mirtazapine  7.5 mg Oral QPM  . multivitamin with minerals  1 tablet Oral Daily  . mupirocin ointment  1 application Nasal BID  . pantoprazole  40 mg Oral Daily  . pilocarpine  1 drop Left Eye BID  . rOPINIRole  1 mg Oral QPM  . sodium chloride flush  3 mL Intravenous Q12H  . sodium chloride flush  3 mL Intravenous Q12H  . timolol  1 drop Left Eye BID  . Travoprost (BAK Free)  1 drop Left Eye QHS  . warfarin  2 mg Oral ONCE-1800  . Warfarin - Pharmacist Dosing Inpatient   Does not apply q1800   Continuous Infusions: . sodium chloride       LOS: 3 days    Time spent: 35 minutes    Hosie Poisson,  MD [facility] Pager [phone]  If 7PM-7AM, please contact night-coverage [email or web address] Password [facility] 02/05/2018, 5:14 PM

## 2018-02-05 NOTE — Consult Note (Signed)
[place] for Infectious Disease       Reason for Consult: open wound    Referring Physician: Dr. Karleen Hampshire  Principal Problem:   Wound infection complicating hardware [facility]) Active Problems:   Atrial fibrillation ([place])   Hypothyroidism   COPD (chronic obstructive pulmonary disease) ([place])   Chronic systolic CHF (congestive heart failure) (HCC)   Chronic respiratory failure with hypoxia (HCC)   Macrocytic anemia   Anxiety   Agitation   Anticoagulated on Coumadin   . busPIRone  7.5 mg Oral BID  . Chlorhexidine Gluconate Cloth  6 each Topical Q0600  . dorzolamide  1 drop Left Eye BID  . feeding supplement (ENSURE ENLIVE)  237 mL Oral TID BM  . FLUoxetine  40 mg Oral Daily  . gabapentin  600 mg Oral BID  . levothyroxine  75 mcg Oral QAC breakfast  . methocarbamol  500 mg Oral BID  . mirtazapine  7.5 mg Oral QPM  . multivitamin with minerals  1 tablet Oral Daily  . mupirocin ointment  1 application Nasal BID  . pantoprazole  40 mg Oral Daily  . pilocarpine  1 drop Left Eye BID  . rOPINIRole  1 mg Oral QPM  . sodium chloride flush  3 mL Intravenous Q12H  . sodium chloride flush  3 mL Intravenous Q12H  . timolol  1 drop Left Eye BID  . Travoprost (BAK Free)  1 drop Left Eye QHS  . warfarin  2 mg Oral ONCE-1800  . Warfarin - Pharmacist Dosing Inpatient   Does not apply q1800    Recommendations: Stop antibiotics  Observe 2-3 days off of antibiotics If stable in 2-3 days, can discharge off of antibiotics Can get a 'on-call'presciption for Keflex 500 mg QID for 7 days with 2 refills if she later develops signs of infection   Assessment: She has an open wound with hardware exposed, bone exposed.  No current cellulitis or signs of infection.     Antibiotics: Vancomycin and cefepime  HPI: [name] is a 84 y.o. female with multiple medical problems and a non-healing right leg wound with a history of ORIF of the right ankle in 2012.  She has been causing heal  ulcers by rubbing her heal and has had signficant issues with pain of the leg.  She has had recent issues with cellulitis and drainage out of the area of her leg at the site of the exposed hardware but no issues at this time.  Dr. Doran Durand feels surgery would be complex and the family has decided to pursue comfort care for her.  The question arose of 'suppressing' the infection with an oral antibiotic.  She does have a history of ESBL infection in the urine.   Pictures of the leg reviewed with the daughter at the bedside.   Review of Systems:  pain No diarrhea   Past Medical History:  Diagnosis Date  . Aortic atherosclerosis ([place]) 07/07/2017  . Atrial fibrillation (HCC)    chronic Coumadin.   . CHF (congestive heart failure) ([place])   . COPD (chronic obstructive pulmonary disease) ([place])   . Glaucoma   . Hypothyroidism   . Neuropathy    PERIPHERAL  . Osteoarthritis   . Osteoporosis   . Trimalleolar fracture    LEFT TRIMALLEOLAR FRACTURE, DISLOCATION WITH OBLIQUE FRACTURE OF THE DISTAL FIBULAR DIAPHYSIS, AND NOTED BEING MILDLY COMMINUTED    Social History   Tobacco Use  . Smoking status: Never Smoker  . Smokeless  tobacco: Never Used  Substance Use Topics  . Alcohol use: No  . Drug use: No    Family History  Problem Relation Age of Onset  . CAD Mother   . Heart failure Father   . Heart failure Brother   . CAD Brother   . Colon cancer Neg Hx   . Stomach cancer Neg Hx     No Known Allergies  Exam: Constitutional: alert, nad HENT: no thrush Blood pressure 113/76, pulse 65, temperature (!) 97.4 F (36.3 C), temperature source Oral, resp. rate 15, height 5\' 7"  (1.702 m), weight 122 lb (55.3 kg), SpO2 97 %. CV: irr RR Lungs: CTA B Abd: soft, nt MS: boot on Skin: no rashes Neuro: calm   Lab Results  Component Value Date   WBC 5.4 02/05/2018   HGB 9.0 (L) 02/05/2018   HCT 28.8 (L) 02/05/2018   MCV 98.0 02/05/2018   PLT 323 02/05/2018    Lab Results  Component  Value Date   CREATININE 0.86 02/05/2018   BUN 12 02/05/2018   NA 140 02/05/2018   K 5.4 (H) 02/05/2018   CL 108 02/05/2018   CO2 28 02/05/2018    Lab Results  Component Value Date   ALT 32 07/03/2017   AST 85 (H) 07/03/2017   ALKPHOS 145 (H) 07/03/2017     Microbiology: Recent Results (from the past 240 hour(s))  Blood culture (routine x 2)     Status: None (Preliminary result)   Collection Time: 02/02/18 11:53 AM  Result Value Ref Range Status   Specimen Description BLOOD RIGHT WRIST  Final   Special Requests   Final    BOTTLES DRAWN AEROBIC AND ANAEROBIC Blood Culture results may not be optimal due to an inadequate volume of blood received in culture bottles   Culture   Final    NO GROWTH 2 DAYS Performed at [facility], [place] [address], [place], [place] [address]    Report Status PENDING  Incomplete  Blood culture (routine x 2)     Status: None (Preliminary result)   Collection Time: 02/02/18  1:30 PM  Result Value Ref Range Status   Specimen Description BLOOD LEFT ANTECUBITAL  Final   Special Requests   Final    BOTTLES DRAWN AEROBIC AND ANAEROBIC Blood Culture adequate volume   Culture   Final    NO GROWTH 2 DAYS Performed at [facility], [place] [address], [place], [place] [address]    Report Status PENDING  Incomplete  MRSA PCR Screening     Status: Abnormal   Collection Time: 02/02/18 10:30 PM  Result Value Ref Range Status   MRSA by PCR POSITIVE (A) NEGATIVE Final    Comment:        The GeneXpert MRSA Assay (FDA approved for NASAL specimens only), is one component of a comprehensive MRSA colonization surveillance program. It is not intended to diagnose MRSA infection nor to guide or monitor treatment for MRSA infections. RESULT CALLED TO, READ BACK BY AND VERIFIED WITH: Rosalie Doctor RN 02/03/18 0248 JDW Performed at [facility] [address], [place], [place] [address]     Tysen Roesler W Derryl Uher, [place] for Infectious  Disease [facility] [email or web address] [phone] pager  [phone] cell 02/05/2018, 2:31 PM

## 2018-02-05 NOTE — Progress Notes (Addendum)
Patient had a 14 beat run of Vtach. Patient resting in bed with no c/o chest pain or discomfort. On-call physician [facility] notified. Will continue to monitor and treat as ordered.

## 2018-02-06 DIAGNOSIS — Z7189 Other specified counseling: Secondary | ICD-10-CM

## 2018-02-06 DIAGNOSIS — Z515 Encounter for palliative care: Secondary | ICD-10-CM

## 2018-02-06 LAB — PROTIME-INR
INR: 1.8
Prothrombin Time: 20.7 seconds — ABNORMAL HIGH (ref 11.4–15.2)

## 2018-02-06 LAB — BASIC METABOLIC PANEL
ANION GAP: 5 (ref 5–15)
BUN: 12 mg/dL (ref 6–20)
CALCIUM: 8.4 mg/dL — AB (ref 8.9–10.3)
CHLORIDE: 104 mmol/L (ref 101–111)
CO2: 28 mmol/L (ref 22–32)
Creatinine, Ser: 0.83 mg/dL (ref 0.44–1.00)
GFR calc non Af Amer: >60 mL/min (ref >60)
Glucose, Bld: 71 mg/dL (ref 65–99)
Potassium: 4.8 mmol/L (ref 3.5–5.1)
Sodium: 137 mmol/L (ref 135–145)

## 2018-02-06 LAB — T4, FREE: FREE T4: 0.78 ng/dL — AB (ref 0.82–1.77)

## 2018-02-06 MED ORDER — WARFARIN SODIUM 3 MG PO TABS
3.0000 mg | ORAL_TABLET | Freq: Once | ORAL | Status: AC
  Administered 2018-02-06: 3 mg via ORAL
  Filled 2018-02-06: qty 1

## 2018-02-06 NOTE — Consult Note (Signed)
Consultation Note Date: 02/06/2018   Patient Name: [name]  DOB: February 18, 1927  MRN: [id number]  Age / Sex: 84 y.o., female  PCP: Tamsen Roers, MD Referring Physician: Hosie Poisson, MD  Reason for Consultation: Establishing goals of care  HPI/Patient Profile: 84 y/o female who was sent to the ER by her podiatrist after a home visit last week.  She had ORIF of a right ankle fracture in about 2012 by Dr. Telford Nab.  She has had difficulty with the wound over the years.  Over the last few months family has noted increase in the size of the right lateral ankle wound.  The patient has been on oral abx several times. She has other skin wounds.     Clinical Assessment and Goals of Care: [name] is resting in bed with daughter [name] at the bedside. [name] states she and her daughter care for [name] during the day, and her son [name] stays with her at night. Ms. Victorino is retired from [employer] and [employer]. She is widowed.  She loves to plan meals, do container gardening, plan meals, and quilt. She has not been able to stand or walk in years, but uses a motorized wheelchair. She loves to watch the birds and and sit on the deck. She is no longer able to pull herself with her arms. She has a poor appetite.   [name] shows me pictures of sacral, heel, and extremity wound. She states the wounds are healing except for the metal exposure.   We discussed her  [place].  She states her mother wants all care possible for as long as possible. She states she and her brother understand where things are, and that when God is ready to take her, then she will leave this earth. They understand how CPR would impact her body, yet it is what her mother would want. She states she will advocate to honor her mother's wishes regardless of her own.    She states if someone told her there is nothing more that can be done, they would  discuss this.      SUMMARY OF RECOMMENDATIONS   Continue full aggressive care.    Code Status/Advance Care Planning:  Full code    Symptom Management:   Agree with muscle relaxer.  Agree with Neurontin.  Agree with Norco PRN.    Palliative Prophylaxis:   Eye Care and Oral Care   Prognosis:   Unable to determine This depends on her wound healing.   Discharge Planning: Recommend palliative at D/C.      Primary Diagnoses: Present on Admission: . Hypothyroidism . Macrocytic anemia . Chronic respiratory failure with hypoxia ([place]) . COPD (chronic obstructive pulmonary disease) ([place]) . Atrial fibrillation ([place]) . Chronic systolic CHF (congestive heart failure) ([place]) . Anxiety . Agitation . Wound infection complicating hardware ([place])   I have reviewed the medical record, interviewed the patient and family, and examined the patient. The following aspects are pertinent.  Past Medical History:  Diagnosis Date  . Aortic atherosclerosis (  [place]) 07/07/2017  . Atrial fibrillation (HCC)    chronic Coumadin.   . CHF (congestive heart failure) ([place])   . COPD (chronic obstructive pulmonary disease) ([place])   . Glaucoma   . Hypothyroidism   . Neuropathy    PERIPHERAL  . Osteoarthritis   . Osteoporosis   . Trimalleolar fracture    LEFT TRIMALLEOLAR FRACTURE, DISLOCATION WITH OBLIQUE FRACTURE OF THE DISTAL FIBULAR DIAPHYSIS, AND NOTED BEING MILDLY COMMINUTED   Social History   Socioeconomic History  . Marital status: Widowed    Spouse name: Not on file  . Number of children: Not on file  . Years of education: Not on file  . Highest education level: Not on file  Occupational History  . Not on file  Social Needs  . Financial resource strain: Not on file  . Food insecurity:    Worry: Not on file    Inability: Not on file  . Transportation needs:    Medical: Not on file    Non-medical: Not on file  Tobacco Use  . Smoking status: Never Smoker  . Smokeless  tobacco: Never Used  Substance and Sexual Activity  . Alcohol use: No  . Drug use: No  . Sexual activity: Never  Lifestyle  . Physical activity:    Days per week: Not on file    Minutes per session: Not on file  . Stress: Not on file  Relationships  . Social connections:    Talks on phone: Not on file    Gets together: Not on file    Attends religious service: Not on file    Active member of club or organization: Not on file    Attends meetings of clubs or organizations: Not on file    Relationship status: Not on file  Other Topics Concern  . Not on file  Social History Narrative  . Not on file   Family History  Problem Relation Age of Onset  . CAD Mother   . Heart failure Father   . Heart failure Brother   . CAD Brother   . Colon cancer Neg Hx   . Stomach cancer Neg Hx    Scheduled Meds: . busPIRone  7.5 mg Oral BID  . Chlorhexidine Gluconate Cloth  6 each Topical Q0600  . dorzolamide  1 drop Left Eye BID  . feeding supplement (ENSURE ENLIVE)  237 mL Oral TID BM  . FLUoxetine  40 mg Oral Daily  . gabapentin  600 mg Oral BID  . levothyroxine  75 mcg Oral QAC breakfast  . methocarbamol  500 mg Oral BID  . mirtazapine  7.5 mg Oral QPM  . multivitamin with minerals  1 tablet Oral Daily  . mupirocin ointment  1 application Nasal BID  . pantoprazole  40 mg Oral Daily  . pilocarpine  1 drop Left Eye BID  . rOPINIRole  1 mg Oral QPM  . sodium chloride flush  3 mL Intravenous Q12H  . sodium chloride flush  3 mL Intravenous Q12H  . timolol  1 drop Left Eye BID  . Travoprost (BAK Free)  1 drop Left Eye QHS  . warfarin  3 mg Oral ONCE-1800  . Warfarin - Pharmacist Dosing Inpatient   Does not apply q1800   Continuous Infusions: . sodium chloride     PRN Meds:.sodium chloride, acetaminophen **OR** acetaminophen, albuterol, bisacodyl, guaiFENesin, HYDROcodone-acetaminophen, ondansetron **OR** ondansetron (ZOFRAN) IV, senna-docusate, sodium chloride flush Medications Prior  to Admission:  Prior to Admission medications  Medication Sig Start Date End Date Taking? Authorizing Provider  acetaminophen (TYLENOL) 500 MG tablet Take 1,000 mg by mouth as needed for mild pain or headache.   Yes [provider]  albuterol (PROVENTIL) (2.5 MG/3ML) 0.083% nebulizer solution Take 2.5 mg every 6 (six) hours as needed by nebulization for wheezing or shortness of breath.   Yes [provider]  busPIRone (BUSPAR) 7.5 MG tablet Take 7.5 mg by mouth 2 (two) times daily.   Yes [provider]  collagenase (SANTYL) ointment Apply topically daily. Apply Santyl to right outer ankle and right heel wounds Q day, then cover with foam dressing.  (Change foam dressing Q 3 days or PRN soiling.) 07/10/17  Yes Samuella Cota, MD  dorzolamide (TRUSOPT) 2 % ophthalmic solution Place 1 drop 2 (two) times daily into the left eye.   Yes [provider]  ergocalciferol (VITAMIN D2) 50000 UNITS capsule Take 50,000 Units by mouth every Thursday.    Yes [provider]  FLUoxetine (PROZAC) 40 MG capsule Take 40 mg by mouth daily.  03/29/17  Yes [provider]  furosemide (LASIX) 20 MG tablet Take 20 mg by mouth 2 (two) times daily as needed for fluid. 08/07/15  Yes [provider]  gabapentin (NEURONTIN) 600 MG tablet Take 600 mg by mouth 2 (two) times daily.    Yes [provider]  guaiFENesin (MUCINEX) 600 MG 12 hr tablet Take 600 mg by mouth 2 (two) times daily as needed for cough or to loosen phlegm.    Yes [provider]  levothyroxine (SYNTHROID, LEVOTHROID) 75 MCG tablet Take 75 mcg by mouth daily before breakfast.    Yes [provider]  loperamide (IMODIUM) 2 MG capsule Take 1 capsule (2 mg total) by mouth daily as needed for diarrhea or loose stools. 04/09/17  Yes Short, Noah Delaine, MD  loratadine (CLARITIN) 10 MG tablet Take 10 mg by mouth daily as needed for allergies.    Yes [provider]    methocarbamol (ROBAXIN) 500 MG tablet Take 500 mg 2 (two) times daily by mouth. 07/02/17  Yes [provider]  metoprolol tartrate (LOPRESSOR) 25 MG tablet Take 12.5 mg 2 (two) times daily by mouth.   Yes [provider]  mirtazapine (REMERON) 7.5 MG tablet Take 7.5 mg by mouth every evening. 03/09/17  Yes [provider]  nystatin cream (MYCOSTATIN) Apply 1 application topically as needed for dry skin (rash).   Yes [provider]  omeprazole (PRILOSEC) 40 MG capsule Take 40 mg by mouth daily.    Yes [provider]  OXYGEN Inhale 3 L daily into the lungs. Continuous    Yes [provider]  pilocarpine (PILOCAR) 4 % ophthalmic solution Place 1 drop 2 (two) times daily into the left eye.   Yes [provider]  rOPINIRole (REQUIP) 1 MG tablet Take 1 mg by mouth every evening. 02/11/17  Yes [provider]  timolol (TIMOPTIC) 0.5 % ophthalmic solution Place 1 drop into the left eye 2 (two) times daily. 09/11/16  Yes [provider]  travoprost, benzalkonium, (TRAVATAN) 0.004 % ophthalmic solution Place 1 drop into the left eye at bedtime.    Yes [provider]  warfarin (COUMADIN) 2 MG tablet Take 1 mg by mouth daily. Taking 1/2 tablet (1mg ) daily at 6pm   Yes [provider]  warfarin (COUMADIN) 3 MG tablet Take 0.5 tablets (1.5 mg total) by mouth daily at 6 PM. Patient not taking: Reported  on 02/02/2018 07/09/17   Samuella Cota, MD   No Known Allergies Review of Systems  Neurological:       Leg pain.    Physical Exam  Constitutional: No distress.  Thin and frail.   Pulmonary/Chest: Effort normal.  Neurological: She is alert.    Vital Signs: BP 106/76   Pulse 68   Temp 97.6 F (36.4 C) (Other (Comment))   Resp 20   Ht 5\' 7"  (1.702 m)   Wt 49.4 kg (109 lb) Comment: one pillow,one sheet, one blanket on bed  SpO2 (!) 89%   BMI 17.07 kg/m  Pain Scale: PAINAD   Pain Score:  Asleep   SpO2: SpO2: (!) 89 % O2 Device:SpO2: (!) 89 % O2 Flow Rate: .O2 Flow Rate (L/min): 3 L/min  IO: Intake/output summary:   Intake/Output Summary (Last 24 hours) at 02/06/2018 1455 Last data filed at 02/06/2018 0900 Gross per 24 hour  Intake 200 ml  Output 1200 ml  Net -1000 ml    LBM: Last BM Date: 02/05/18 Baseline Weight: Weight: 57.5 kg (126 lb 12.2 oz) Most recent weight: Weight: 49.4 kg (109 lb)(one pillow,one sheet, one blanket on bed)     Palliative Assessment/Data: 40%     Time In: 1:40 Time Out: 3:00 Time Total: 1 hour 20 min Greater than 50%  of this time was spent counseling and coordinating care related to the above assessment and plan.  Signed by: Asencion Gowda, NP   Please contact Palliative Medicine Team phone at [phone] for questions and concerns.  For individual provider: See Shea Evans

## 2018-02-06 NOTE — Progress Notes (Signed)
[place] for warfarin Indication: atrial fibrillation  No Known Allergies  Patient Measurements: Height: 5\' 7"  (170.2 cm) Weight: 109 lb (49.4 kg)(one pillow,one sheet, one blanket on bed) IBW/kg (Calculated) : 61.6  Vital Signs: Temp: 97.9 F (36.6 C) (06/23 0759) Temp Source: Oral (06/23 0759) BP: 106/66 (06/23 0759) Pulse Rate: 69 (06/23 0759)  Labs: Recent Labs    02/04/18 0525 02/05/18 0701 02/05/18 1157 02/06/18 0737 02/06/18 0947  HGB  --   --  9.0*  --   --   HCT  --   --  28.8*  --   --   PLT  --   --  323  --   --   LABPROT 16.8* 21.5*  --   --  20.7*  INR 1.37 1.89  --   --  1.80  CREATININE  --   --  0.86 0.83  --     Estimated Creatinine Clearance: 35.8 mL/min (by C-G formula based on SCr of 0.83 mg/dL).   Medical History: Past Medical History:  Diagnosis Date  . Aortic atherosclerosis ([place]) 07/07/2017  . Atrial fibrillation (HCC)    chronic Coumadin.   . CHF (congestive heart failure) ([place])   . COPD (chronic obstructive pulmonary disease) ([place])   . Glaucoma   . Hypothyroidism   . Neuropathy    PERIPHERAL  . Osteoarthritis   . Osteoporosis   . Trimalleolar fracture    LEFT TRIMALLEOLAR FRACTURE, DISLOCATION WITH OBLIQUE FRACTURE OF THE DISTAL FIBULAR DIAPHYSIS, AND NOTED BEING MILDLY COMMINUTED   Assessment: 62 yof presented to the ED with foot pain. She is on chronic warfarin for history of afib. INR slightly below goal range today at 1.8. No bleeding noted. Hemoglobin low, stable and platelets WNL.   Patient did not receive dose on 6/19 due to sedation.   PTA dose 1mg  daily  Goal of Therapy:  INR 2-3 Monitor platelets by anticoagulation protocol: Yes   Plan:  Warfarin 3mg  PO x 1 tonight Daily INR/ CBC  Monitor for S/sx of bleeding  Jalene Mullet, Pharm.D. PGY1 Pharmacy Resident 02/06/2018 10:45 AM Please check AMION for all [place] numbers

## 2018-02-06 NOTE — Progress Notes (Signed)
PROGRESS NOTE    [name]  XAJ:[id number] DOB: Dec 23, 1926 DOA: 02/02/2018 PCP: Tamsen Roers, MD    Brief Narrative:  [name] is a 84 y.o. female with medical history significant for hypothyroidism, anxiety, agitation, chronic systolic CHF, atrial fibrillation on warfarin, and COPD with chronic hypoxic respiratory failure, and nonhealing right leg wound now presenting to the emergency department for evaluation of severe leg pains and nonhealing wound.    Assessment & Plan:   Principal Problem:   Wound infection complicating hardware ([place]) Active Problems:   Atrial fibrillation (HCC)   Hypothyroidism   COPD (chronic obstructive pulmonary disease) (HCC)   Chronic systolic CHF (congestive heart failure) (HCC)   Chronic respiratory failure with hypoxia (HCC)   Macrocytic anemia   Anxiety   Agitation   Anticoagulated on Coumadin   Chronic nonhealing right leg wound with exposed hardware: Dr. Doran Durand was consulted recommended conservative management with IV antibiotics at this time. Patient is in constant pain and required pain medications. Palliative care consulted for goals of care, family wanted to continue with aggressive care at this time.  Blood cultures ordered and are pending,negative so far.  Currently the would area does not show any signs of cellulitis and no purulent discharge.  Will d/c IV antibiotics and watch her for 2 to 3 days and monitor off antibiotics.  No fever or leukocytosis.     COPD No wheezing heard, resume albuterol inhaler as needed.   Chronic systolic heart failure she appears to be compensated.   Chronic respiratory failure with hypoxia probably secondary to COPD At baseline she uses on 3 L of nasal cannula oxygen.   Hypothyroidism: abnormal TSH, free t4 and freet3 ordered.  Will probably need to increase the synthroid.     Chronic atrial fibrillation Controlled.  On Coumadin for anticoagulation.   Hyperkalemia: Pt having  diarrhea,resolved.      DVT prophylaxis: scd's Code Status: Full code Family Communication: Daughter at bedside Disposition Plan: Pending evaluation by palliative care Consultants:   Orthopedics Dr. Doran Durand   Procedures: None  Antimicrobials: IV vancomycin and cefepime discontinued.   Subjective: No new complaints. She appears comfortable. No chest pain or sob.   Objective: Vitals:   02/06/18 0759 02/06/18 1229 02/06/18 1612 02/06/18 1618  BP: 106/66 106/76  (!) 107/56  Pulse: 69 68  70  Resp: 20 20  (!) 9  Temp: 97.9 F (36.6 C) 97.6 F (36.4 C) 97.6 F (36.4 C) 98.1 F (36.7 C)  TempSrc: Oral Other (Comment) Oral Oral  SpO2: 100% (!) 89%  100%  Weight:      Height:        Intake/Output Summary (Last 24 hours) at 02/06/2018 1657 Last data filed at 02/06/2018 1612 Gross per 24 hour  Intake 270 ml  Output 1500 ml  Net -1230 ml   Filed Weights   02/04/18 0432 02/05/18 0300 02/06/18 0620  Weight: 50.3 kg (111 lb) 55.3 kg (122 lb) 49.4 kg (109 lb)    Examination:  General exam: Appears calm.  Respiratory system: air entry fair, no wheezing.  Cardiovascular system: S1 & S2 heard, irregular. No JVD, murmurs, Gastrointestinal system: Abdomen is soft non tender non distended bowel sounds heard.  Central nervous system: alert and oriented to person only. Non focal.  Extremities: pedal edemA, right non healing wound with exposed hardware on the right leg.  With right heel pressure ulcer.  No signs of cellulitis.  Skin: sacral pressure ulcer stage 1 Psychiatry:  Mood  Appropriate.     Data Reviewed: I have personally reviewed following labs and imaging studies  CBC: Recent Labs  Lab 02/02/18 1320 02/03/18 0436 02/03/18 0437 02/05/18 1157  WBC 5.9 5.5  --  5.4  NEUTROABS 4.3 4.4  --   --   HGB 8.8* 9.2*  --  9.0*  HCT 28.3* 29.0* 28.8* 28.8*  MCV 100.4* 96.7  --  98.0  PLT 311 334  --  767   Basic Metabolic Panel: Recent Labs  Lab 02/02/18 1320  02/03/18 0436 02/05/18 1157 02/06/18 0737  NA 140 138 140 137  K 4.9 4.7 5.4* 4.8  CL 106 106 108 104  CO2 27 26 28 28   GLUCOSE 90 92 122* 71  BUN 10 9 12 12   CREATININE 0.87 0.80 0.86 0.83  CALCIUM 8.7* 8.4* 8.8* 8.4*   GFR: Estimated Creatinine Clearance: 35.8 mL/min (by C-G formula based on SCr of 0.83 mg/dL). Liver Function Tests: No results for input(s): AST, ALT, ALKPHOS, BILITOT, PROT, ALBUMIN in the last 168 hours. No results for input(s): LIPASE, AMYLASE in the last 168 hours. No results for input(s): AMMONIA in the last 168 hours. Coagulation Profile: Recent Labs  Lab 02/02/18 1320 02/03/18 0436 02/04/18 0525 02/05/18 0701 02/06/18 0947  INR 1.73 1.74 1.37 1.89 1.80   Cardiac Enzymes: No results for input(s): CKTOTAL, CKMB, CKMBINDEX, TROPONINI in the last 168 hours. BNP (last 3 results) No results for input(s): PROBNP in the last 8760 hours. HbA1C: No results for input(s): HGBA1C in the last 72 hours. CBG: No results for input(s): GLUCAP in the last 168 hours. Lipid Profile: No results for input(s): CHOL, HDL, LDLCALC, TRIG, CHOLHDL, LDLDIRECT in the last 72 hours. Thyroid Function Tests: No results for input(s): TSH, T4TOTAL, FREET4, T3FREE, THYROIDAB in the last 72 hours. Anemia Panel: No results for input(s): VITAMINB12, FOLATE, FERRITIN, TIBC, IRON, RETICCTPCT in the last 72 hours. Sepsis Labs: Recent Labs  Lab 02/02/18 1153 02/02/18 1426  LATICACIDVEN 2.01* 1.28    Recent Results (from the past 240 hour(s))  Blood culture (routine x 2)     Status: None (Preliminary result)   Collection Time: 02/02/18 11:53 AM  Result Value Ref Range Status   Specimen Description BLOOD RIGHT WRIST  Final   Special Requests   Final    BOTTLES DRAWN AEROBIC AND ANAEROBIC Blood Culture results may not be optimal due to an inadequate volume of blood received in culture bottles   Culture   Final    NO GROWTH 4 DAYS Performed at [facility], [place] [address] [place], [place] [address]    Report Status PENDING  Incomplete  Blood culture (routine x 2)     Status: None (Preliminary result)   Collection Time: 02/02/18  1:30 PM  Result Value Ref Range Status   Specimen Description BLOOD LEFT ANTECUBITAL  Final   Special Requests   Final    BOTTLES DRAWN AEROBIC AND ANAEROBIC Blood Culture adequate volume   Culture   Final    NO GROWTH 4 DAYS Performed at [facility], [place] [address], [place], [place] [address]    Report Status PENDING  Incomplete  MRSA PCR Screening     Status: Abnormal   Collection Time: 02/02/18 10:30 PM  Result Value Ref Range Status   MRSA by PCR POSITIVE (A) NEGATIVE Final    Comment:        The GeneXpert MRSA Assay (FDA approved for NASAL specimens only),  is one component of a comprehensive MRSA colonization surveillance program. It is not intended to diagnose MRSA infection nor to guide or monitor treatment for MRSA infections. RESULT CALLED TO, READ BACK BY AND VERIFIED WITH: Rosalie Doctor RN 02/03/18 0248 JDW Performed at [facility] [address] [place], [place] [address]          Radiology Studies: No results found.      Scheduled Meds: . busPIRone  7.5 mg Oral BID  . Chlorhexidine Gluconate Cloth  6 each Topical Q0600  . dorzolamide  1 drop Left Eye BID  . feeding supplement (ENSURE ENLIVE)  237 mL Oral TID BM  . FLUoxetine  40 mg Oral Daily  . gabapentin  600 mg Oral BID  . levothyroxine  75 mcg Oral QAC breakfast  . methocarbamol  500 mg Oral BID  . mirtazapine  7.5 mg Oral QPM  . multivitamin with minerals  1 tablet Oral Daily  . mupirocin ointment  1 application Nasal BID  . pantoprazole  40 mg Oral Daily  . pilocarpine  1 drop Left Eye BID  . rOPINIRole  1 mg Oral QPM  . sodium chloride flush  3 mL Intravenous Q12H  . sodium chloride flush  3 mL Intravenous Q12H  . timolol  1 drop Left Eye BID  . Travoprost (BAK Free)  1 drop Left Eye QHS  . warfarin  3 mg Oral  ONCE-1800  . Warfarin - Pharmacist Dosing Inpatient   Does not apply q1800   Continuous Infusions: . sodium chloride       LOS: 4 days    Time spent: 35 minutes    Hosie Poisson, MD [facility] Pager [phone]  If 7PM-7AM, please contact night-coverage [email or web address] Password [facility] 02/06/2018, 4:57 PM

## 2018-02-07 ENCOUNTER — Encounter (HOSPITAL_COMMUNITY): Payer: Self-pay | Admitting: General Practice

## 2018-02-07 ENCOUNTER — Other Ambulatory Visit: Payer: Self-pay

## 2018-02-07 LAB — CULTURE, BLOOD (ROUTINE X 2)
Culture: NO GROWTH
Culture: NO GROWTH
Special Requests: ADEQUATE

## 2018-02-07 LAB — T3, FREE: T3 FREE: 1.1 pg/mL — AB (ref 2.0–4.4)

## 2018-02-07 LAB — PROTIME-INR
INR: 1.93
PROTHROMBIN TIME: 21.9 s — AB (ref 11.4–15.2)

## 2018-02-07 MED ORDER — LEVOTHYROXINE SODIUM 100 MCG PO TABS
100.0000 ug | ORAL_TABLET | Freq: Every day | ORAL | Status: DC
Start: 2018-02-08 — End: 2018-02-11
  Administered 2018-02-08 – 2018-02-11 (×3): 100 ug via ORAL
  Filled 2018-02-07 (×4): qty 1

## 2018-02-07 MED ORDER — WARFARIN SODIUM 3 MG PO TABS
3.0000 mg | ORAL_TABLET | Freq: Once | ORAL | Status: AC
  Administered 2018-02-07: 3 mg via ORAL
  Filled 2018-02-07: qty 1

## 2018-02-07 NOTE — Evaluation (Addendum)
Clinical/Bedside Swallow Evaluation Patient Details  Name: [name] MRN: [id number] Date of Birth: 09-26-1926  Today's Date: 02/07/2018 Time: SLP Start Time (ACUTE ONLY): 1600 SLP Stop Time (ACUTE ONLY): 1625 SLP Time Calculation (min) (ACUTE ONLY): 25 min  Past Medical History:  Past Medical History:  Diagnosis Date  . Aortic atherosclerosis ([place]) 07/07/2017  . Atrial fibrillation (HCC)    chronic Coumadin.   . CHF (congestive heart failure) ([place])   . COPD (chronic obstructive pulmonary disease) ([place])   . Glaucoma   . Hypothyroidism   . Neuropathy    PERIPHERAL  . Osteoarthritis   . Osteoporosis   . Sacral ulcer ([place]) 01/2018  . Trimalleolar fracture    LEFT TRIMALLEOLAR FRACTURE, DISLOCATION WITH OBLIQUE FRACTURE OF THE DISTAL FIBULAR DIAPHYSIS, AND NOTED BEING MILDLY COMMINUTED  . Wound infection 01/2018   right lower leg   Past Surgical History:  Past Surgical History:  Procedure Laterality Date  . COLONOSCOPY W/ POLYPECTOMY  11/2010   Dr Fuller Plan.  multiple polyps:cecal, transverse, sigmoid.  largest 72mm, path: tubular adenomas without HGD.  mild sigmoid diverticulosis.   Marland Kitchen ORIF TIBIA & FIBULA FRACTURES  2016  . VAGINAL HYSTERECTOMY     HPI:  Pt is an 84 y.o. female with PMH of hypothyroidism, anxiety, agitation, chronic systolic CHF, atrial fibrillation on warfarin, and COPD with chronic hypoxic respiratory failure, and nonhealing right leg wound now presenting to the emergency department for evaluation of severe leg pains and nonhealing wound.  Patient underwent ORIF of the right ankle in 2012 and says subsequently been complicated by poor wound healing.  Patient was previously managed in the wound care clinic and has been treated with multiple doses of oral antibiotics.  She is not currently on antibiotics.  Family reports that she complains of severe pain at the site, which they described as worsening with exposure of underlying surgical hardware and bone. Pt has had  significant weight loss over past year, diet reportedly primarily limited to tomato soup, ice cream, and ensure with coffee. Palliative care has been consulted. CXR 6/19- Pulmonary vascular congestion. Pleural effusions bilaterally. Areas of patchy atelectasis bilaterally but no appreciable edema or frank consolidation. Pt has had recurrent PNAs and followed by ST in the past. Most recent MBS 07/05/17- primary concern is stasis in cervical esophagus of solids and liquids along with flash penetration; recommended dysphagia 2/ thin liquids, meds crushed. On this admission bedside swallow eval ordered due to coughing on liquids.    Assessment / Plan / Recommendation Clinical Impression  Pt currently with s/s of a primary esophageal dysphagia including frequent belching, feeling full quickly, slight wet vocal quality toward end of evaluation after consuming entire pudding cup. Previous MBS in 2018 revealed esophageal stasis and reflux. May be beneficial to pursue further esophageal assessment. Granddaughter at bedside and RN reported suspected aspiration event with thin liquids this a.m. after which pt coughed for a long duration; however, no overt s/s of aspiration were noted during this clinical evaluation. Granddaughter reports that coughing episodes seem worse in the a.m. Granddaughter also noted that pt's vocal quality has changed over the past year- very strained/ hoarse quality during evaluation. Recommend downgrading to dysphagia 2 diet/ thin liquids, meds crushed in puree, ensure pt seated upright during all PO intake and for 60 minutes after meal, provide smaller more frequent meals/ snacks, alternate solid with liquid. Will continue to follow for diet tolerance/ compensatory strategy training. SLP Visit Diagnosis: Dysphagia, pharyngoesophageal phase (R13.14)  Aspiration Risk  Moderate aspiration risk    Diet Recommendation Dysphagia 2 (Fine chop);Thin liquid   Liquid Administration via:  Cup;Straw Medication Administration: Crushed with puree Supervision: Patient able to self feed;Full supervision/cueing for compensatory strategies Compensations: Slow rate;Small sips/bites;Follow solids with liquid Postural Changes: Seated upright at 90 degrees;Remain upright for at least 30 minutes after po intake    Other  Recommendations Recommended Consults: Consider esophageal assessment Oral Care Recommendations: Oral care BID   Follow up Recommendations Skilled Nursing facility      Frequency and Duration min 1 x/week  1 week       Prognosis Prognosis for Safe Diet Advancement: Fair Barriers to Reach Goals: Other (Comment)(esophagaeal issues)      Swallow Study   General HPI: Pt is an 84 y.o. female with PMH of hypothyroidism, anxiety, agitation, chronic systolic CHF, atrial fibrillation on warfarin, and COPD with chronic hypoxic respiratory failure, and nonhealing right leg wound now presenting to the emergency department for evaluation of severe leg pains and nonhealing wound.  Patient underwent ORIF of the right ankle in 2012 and says subsequently been complicated by poor wound healing.  Patient was previously managed in the wound care clinic and has been treated with multiple doses of oral antibiotics.  She is not currently on antibiotics.  Family reports that she complains of severe pain at the site, which they described as worsening with exposure of underlying surgical hardware and bone. Pt has had significant weight loss over past year, diet reportedly primarily limited to tomato soup, ice cream, and ensure with coffee. Palliative care has been consulted. CXR 6/19- Pulmonary vascular congestion. Pleural effusions bilaterally. Areas of patchy atelectasis bilaterally but no appreciable edema or frank consolidation. Pt has had recurrent PNAs and followed by ST in the past. Most recent MBS 07/05/17- primary concern is stasis in cervical esophagus of solids and liquids along with  flash penetration; recommended dysphagia 2/ thin liquids, meds crushed. On this admission bedside swallow eval ordered due to coughing on liquids.  Type of Study: Bedside Swallow Evaluation Previous Swallow Assessment: See HPI Diet Prior to this Study: Regular;Thin liquids Temperature Spikes Noted: No Respiratory Status: Nasal cannula History of Recent Intubation: No Behavior/Cognition: Alert;Cooperative;Pleasant mood Oral Cavity Assessment: Within Functional Limits Oral Cavity - Dentition: Dentures, top;Dentures, bottom Vision: Functional for self-feeding Self-Feeding Abilities: Able to feed self Patient Positioning: Upright in bed Baseline Vocal Quality: Other (comment)(strained) Volitional Cough: Other (Comment)("I don't want to cough")    Oral/Motor/Sensory Function Overall Oral Motor/Sensory Function: Within functional limits   Ice Chips Ice chips: Not tested   Thin Liquid Thin Liquid: Within functional limits Presentation: Straw    Nectar Thick Nectar Thick Liquid: Not tested   Honey Thick Honey Thick Liquid: Not tested   Puree Puree: Impaired Presentation: Self Fed;Spoon Pharyngeal Phase Impairments: Wet Vocal Quality   Solid   GO   Solid: Not tested        Kern Reap, MA, CCC-SLP 02/07/2018,4:44 PM  [phone]

## 2018-02-07 NOTE — Progress Notes (Signed)
ANTICOAGULATION CONSULT NOTE - Follow Up Consult  Pharmacy Consult for Coumadin Indication: atrial fibrillation  No Known Allergies  Patient Measurements: Height: 5\' 7"  (170.2 cm) Weight: 109 lb (49.4 kg) IBW/kg (Calculated) : 61.6 Heparin Dosing Weight:    Vital Signs: Temp: 98.2 F (36.8 C) (06/23 2337) Temp Source: Oral (06/23 2337) BP: 115/90 (06/24 0431) Pulse Rate: 58 (06/24 0227)  Labs: Recent Labs    02/05/18 0701 02/05/18 1157 02/06/18 0737 02/06/18 0947 02/07/18 0451  HGB  --  9.0*  --   --   --   HCT  --  28.8*  --   --   --   PLT  --  323  --   --   --   LABPROT 21.5*  --   --  20.7* 21.9*  INR 1.89  --   --  1.80 1.93  CREATININE  --  0.86 0.83  --   --     Estimated Creatinine Clearance: 35.8 mL/min (by C-G formula based on SCr of 0.83 mg/dL).   Assessment: Anticoag: Warfarin for hx afib - INR 1.80>1.93, Hgb 9, plts WNL, no bleeding noted (PTA 1mg  daily).   Goal of Therapy:  INR 2-3 Monitor platelets by anticoagulation protocol: Yes   Plan:  Warfarin 3mg  PO x 1 tonight Daily INR  Danyetta Gillham [name], PharmD, [facility] Clinical Staff Pharmacist Pager [phone]  [name] 02/07/2018,10:50 AM

## 2018-02-07 NOTE — Progress Notes (Signed)
PROGRESS NOTE    [name]  IRC:[id number] DOB: 1926/09/24 DOA: 02/02/2018 PCP: Clovia Cuff, MD    Brief Narrative:  [name] is a 84 y.o. female with medical history significant for hypothyroidism, anxiety, agitation, chronic systolic CHF, atrial fibrillation on warfarin, and COPD with chronic hypoxic respiratory failure, and nonhealing right leg wound now presenting to the emergency department for evaluation of severe leg pains and nonhealing wound.    Assessment & Plan:   Principal Problem:   Wound infection complicating hardware ([place]) Active Problems:   Atrial fibrillation (HCC)   Hypothyroidism   COPD (chronic obstructive pulmonary disease) (HCC)   Chronic systolic CHF (congestive heart failure) (HCC)   Chronic respiratory failure with hypoxia (HCC)   Macrocytic anemia   Anxiety   Agitation   Anticoagulated on Coumadin   Chronic nonhealing right leg wound with exposed hardware: Dr. Doran Durand was consulted recommended conservative management with IV antibiotics at this time. Patient is in constant pain and required pain medications. Palliative care consulted for goals of care, family wanted to continue with aggressive care at this time.  Blood cultures ordered and are pending,negative so far.  Currently the would area does not show any signs of cellulitis and no purulent discharge.  Discontinued IV antibiotics, and she remains stable.  No fever or leukocytosis.  Discharge home with  Home health with air mattress and hospital bed.     Coughing on drinking liquids SLP eval requested.    COPD No wheezing heard, resume albuterol inhaler as needed.   Chronic systolic heart failure she appears to be compensated.   Chronic respiratory failure with hypoxia probably secondary to COPD At baseline she uses on 3 L of nasal cannula oxygen.   Hypothyroidism: abnormal TSH, free t4 and freet3 ordered.  Increased synthroid to 100 mcg daily.  Recommend outpatient  thyroid panel in 4 weeks.     Chronic atrial fibrillation Controlled.  On Coumadin for anticoagulation.   Hyperkalemia: Resolved.   Anemia of chronic disease: Hemoglobin stable at 9.      DVT prophylaxis: scd's Code Status: Full code Family Communication: Daughter at bedside Disposition Plan: home tomorrow after the equipment is delivered.  Consultants:   Orthopedics Dr. Doran Durand   Procedures: None  Antimicrobials: IV vancomycin and cefepime discontinued.   Subjective: Coughing on drinking liquids.  She appears comfortable. No chest pain or sob.   Objective: Vitals:   02/07/18 0227 02/07/18 0431 02/07/18 0500 02/07/18 1423  BP: 117/72 115/90  (!) 94/58  Pulse: (!) 58   73  Resp: 16 19  18   Temp:    98.2 F (36.8 C)  TempSrc:    Oral  SpO2: (!) 86%   100%  Weight:   49.4 kg (109 lb)   Height:        Intake/Output Summary (Last 24 hours) at 02/07/2018 1434 Last data filed at 02/06/2018 1612 Gross per 24 hour  Intake -  Output 150 ml  Net -150 ml   Filed Weights   02/05/18 0300 02/06/18 0620 02/07/18 0500  Weight: 55.3 kg (122 lb) 49.4 kg (109 lb) 49.4 kg (109 lb)    Examination:  General exam: was found to be coughing. Respiratory system: good air entry. No wheezing heard.  Cardiovascular system: S1 & S2 heard, irregular. No JVD, murmurs, Gastrointestinal system: Abdomen is soft NT ND BS+ Central nervous system: alert and oriented to person only. Non focal.  Extremities: pedal edemA, right non healing wound with exposed hardware  on the right leg.  With right heel pressure ulcer.  No signs of cellulitis.  Skin: sacral pressure ulcer stage 1 Psychiatry:  Mood  Appropriate.     Data Reviewed: I have personally reviewed following labs and imaging studies  CBC: Recent Labs  Lab 02/02/18 1320 02/03/18 0436 02/03/18 0437 02/05/18 1157  WBC 5.9 5.5  --  5.4  NEUTROABS 4.3 4.4  --   --   HGB 8.8* 9.2*  --  9.0*  HCT 28.3* 29.0* 28.8* 28.8*  MCV  100.4* 96.7  --  98.0  PLT 311 334  --  939   Basic Metabolic Panel: Recent Labs  Lab 02/02/18 1320 02/03/18 0436 02/05/18 1157 02/06/18 0737  NA 140 138 140 137  K 4.9 4.7 5.4* 4.8  CL 106 106 108 104  CO2 27 26 28 28   GLUCOSE 90 92 122* 71  BUN 10 9 12 12   CREATININE 0.87 0.80 0.86 0.83  CALCIUM 8.7* 8.4* 8.8* 8.4*   GFR: Estimated Creatinine Clearance: 35.8 mL/min (by C-G formula based on SCr of 0.83 mg/dL). Liver Function Tests: No results for input(s): AST, ALT, ALKPHOS, BILITOT, PROT, ALBUMIN in the last 168 hours. No results for input(s): LIPASE, AMYLASE in the last 168 hours. No results for input(s): AMMONIA in the last 168 hours. Coagulation Profile: Recent Labs  Lab 02/03/18 0436 02/04/18 0525 02/05/18 0701 02/06/18 0947 02/07/18 0451  INR 1.74 1.37 1.89 1.80 1.93   Cardiac Enzymes: No results for input(s): CKTOTAL, CKMB, CKMBINDEX, TROPONINI in the last 168 hours. BNP (last 3 results) No results for input(s): PROBNP in the last 8760 hours. HbA1C: No results for input(s): HGBA1C in the last 72 hours. CBG: No results for input(s): GLUCAP in the last 168 hours. Lipid Profile: No results for input(s): CHOL, HDL, LDLCALC, TRIG, CHOLHDL, LDLDIRECT in the last 72 hours. Thyroid Function Tests: Recent Labs    02/06/18 1932  FREET4 0.78*  T3FREE 1.1*   Anemia Panel: No results for input(s): VITAMINB12, FOLATE, FERRITIN, TIBC, IRON, RETICCTPCT in the last 72 hours. Sepsis Labs: Recent Labs  Lab 02/02/18 1153 02/02/18 1426  LATICACIDVEN 2.01* 1.28    Recent Results (from the past 240 hour(s))  Blood culture (routine x 2)     Status: None   Collection Time: 02/02/18 11:53 AM  Result Value Ref Range Status   Specimen Description BLOOD RIGHT WRIST  Final   Special Requests   Final    BOTTLES DRAWN AEROBIC AND ANAEROBIC Blood Culture results may not be optimal due to an inadequate volume of blood received in culture bottles   Culture   Final    NO  GROWTH 5 DAYS Performed at [facility], [place] [address] [place], [place] [address]    Report Status 02/07/2018 FINAL  Final  Blood culture (routine x 2)     Status: None   Collection Time: 02/02/18  1:30 PM  Result Value Ref Range Status   Specimen Description BLOOD LEFT ANTECUBITAL  Final   Special Requests   Final    BOTTLES DRAWN AEROBIC AND ANAEROBIC Blood Culture adequate volume   Culture   Final    NO GROWTH 5 DAYS Performed at [facility], [place] [address], [place], [place] [address]    Report Status 02/07/2018 FINAL  Final  MRSA PCR Screening     Status: Abnormal   Collection Time: 02/02/18 10:30 PM  Result Value Ref Range Status   MRSA by PCR POSITIVE (A)  NEGATIVE Final    Comment:        The GeneXpert MRSA Assay (FDA approved for NASAL specimens only), is one component of a comprehensive MRSA colonization surveillance program. It is not intended to diagnose MRSA infection nor to guide or monitor treatment for MRSA infections. RESULT CALLED TO, READ BACK BY AND VERIFIED WITH: Rosalie Doctor RN 02/03/18 0248 JDW Performed at [facility] [address], [place], [place] [address]          Radiology Studies: No results found.      Scheduled Meds: . busPIRone  7.5 mg Oral BID  . dorzolamide  1 drop Left Eye BID  . feeding supplement (ENSURE ENLIVE)  237 mL Oral TID BM  . FLUoxetine  40 mg Oral Daily  . gabapentin  600 mg Oral BID  . [START ON 02/08/2018] levothyroxine  100 mcg Oral QAC breakfast  . methocarbamol  500 mg Oral BID  . mirtazapine  7.5 mg Oral QPM  . multivitamin with minerals  1 tablet Oral Daily  . pantoprazole  40 mg Oral Daily  . pilocarpine  1 drop Left Eye BID  . rOPINIRole  1 mg Oral QPM  . sodium chloride flush  3 mL Intravenous Q12H  . sodium chloride flush  3 mL Intravenous Q12H  . timolol  1 drop Left Eye BID  . Travoprost (BAK Free)  1 drop Left Eye QHS  . warfarin  3 mg Oral ONCE-1800  . Warfarin - Pharmacist  Dosing Inpatient   Does not apply q1800   Continuous Infusions: . sodium chloride       LOS: 5 days    Time spent: 35 minutes    Hosie Poisson, MD [facility] Pager [phone]  If 7PM-7AM, please contact night-coverage [email or web address] Password [facility] 02/07/2018, 2:34 PM

## 2018-02-08 ENCOUNTER — Inpatient Hospital Stay (HOSPITAL_COMMUNITY): Payer: Medicare Other

## 2018-02-08 ENCOUNTER — Encounter (HOSPITAL_COMMUNITY): Payer: Self-pay | Admitting: *Deleted

## 2018-02-08 LAB — BASIC METABOLIC PANEL
ANION GAP: 6 (ref 5–15)
BUN: 14 mg/dL (ref 8–23)
CO2: 31 mmol/L (ref 22–32)
Calcium: 8.8 mg/dL — ABNORMAL LOW (ref 8.9–10.3)
Chloride: 98 mmol/L (ref 98–111)
Creatinine, Ser: 0.78 mg/dL (ref 0.44–1.00)
GFR calc Af Amer: >60 mL/min (ref >60)
GLUCOSE: 122 mg/dL — AB (ref 70–99)
POTASSIUM: 4.6 mmol/L (ref 3.5–5.1)
Sodium: 135 mmol/L (ref 135–145)

## 2018-02-08 LAB — CBC
HEMATOCRIT: 28.3 % — AB (ref 36.0–46.0)
Hemoglobin: 8.9 g/dL — ABNORMAL LOW (ref 12.0–15.0)
MCH: 30.9 pg (ref 26.0–34.0)
MCHC: 31.4 g/dL (ref 30.0–36.0)
MCV: 98.3 fL (ref 78.0–100.0)
PLATELETS: 299 10*3/uL (ref 150–400)
RBC: 2.88 MIL/uL — AB (ref 3.87–5.11)
RDW: 16.7 % — ABNORMAL HIGH (ref 11.5–15.5)
WBC: 12.2 10*3/uL — AB (ref 4.0–10.5)

## 2018-02-08 LAB — PROTIME-INR
INR: 2.1
Prothrombin Time: 23.4 seconds — ABNORMAL HIGH (ref 11.4–15.2)

## 2018-02-08 MED ORDER — AMOXICILLIN-POT CLAVULANATE 875-125 MG PO TABS
1.0000 | ORAL_TABLET | Freq: Two times a day (BID) | ORAL | Status: DC
  Administered 2018-02-08 – 2018-02-11 (×7): 1 via ORAL
  Filled 2018-02-08 (×9): qty 1

## 2018-02-08 MED ORDER — FUROSEMIDE 10 MG/ML IJ SOLN
20.0000 mg | Freq: Two times a day (BID) | INTRAMUSCULAR | Status: DC
  Filled 2018-02-08: qty 2

## 2018-02-08 MED ORDER — WARFARIN SODIUM 2 MG PO TABS
2.0000 mg | ORAL_TABLET | Freq: Every day | ORAL | Status: DC
  Administered 2018-02-08 – 2018-02-10 (×3): 2 mg via ORAL
  Filled 2018-02-08 (×4): qty 1

## 2018-02-08 MED ORDER — FUROSEMIDE 10 MG/ML IJ SOLN
20.0000 mg | Freq: Once | INTRAMUSCULAR | Status: AC
  Administered 2018-02-08: 20 mg via INTRAVENOUS
  Filled 2018-02-08: qty 2

## 2018-02-08 NOTE — Progress Notes (Signed)
PROGRESS NOTE    [name]  WUJ:[id number] DOB: Oct 01, 1926 DOA: 02/02/2018 PCP: Clovia Cuff, MD    Brief Narrative:  [name] is a 84 y.o. female with medical history significant for hypothyroidism, anxiety, agitation, chronic systolic CHF, atrial fibrillation on warfarin, and COPD with chronic hypoxic respiratory failure, and nonhealing right leg wound now presenting to the emergency department for evaluation of severe leg pains and nonhealing wound.    Assessment & Plan:   Principal Problem:   Wound infection complicating hardware ([place]) Active Problems:   Atrial fibrillation (HCC)   Hypothyroidism   COPD (chronic obstructive pulmonary disease) (HCC)   Chronic systolic CHF (congestive heart failure) (HCC)   Chronic respiratory failure with hypoxia (HCC)   Macrocytic anemia   Anxiety   Agitation   Anticoagulated on Coumadin   Chronic nonhealing right leg wound with exposed hardware: Dr. Doran Durand was consulted recommended conservative management with IV antibiotics at this time. Patient is in constant pain and required pain medications. Palliative care consulted for goals of care, family wanted to continue with aggressive care at this time.  Blood cultures ordered and are pending,negative so far.  Currently the would area does not show any signs of cellulitis and no purulent discharge.  Discontinued IV antibiotics, and she remains stable.  No fever or leukocytosis.  Discharge home with  Home health with air mattress.     Coughing on drinking liquids, sob this am and crackles at bases.  SLP eval requested recommended dysphagia 2 diet.  Suspect some aspiration component , added Augmentin for aspiration pneumonitis.    COPD No wheezing heard, resume albuterol inhaler as needed.   Mild Acute on Chronic systolic heart failure: Sob this am and crackles at bases. On 3 lit of [place] oxygen.  CXR shows mild CHF , with bilateral effusions. Will start her on IV lasix 20  mg BID and watch for urine output.  Strict intake and output.    Chronic respiratory failure with hypoxia probably secondary to COPD and CHF exacerbation.  At baseline she uses on 3 L of nasal cannula oxygen.   Hypothyroidism: abnormal TSH, free t4 and freet3 ordered.  Increased synthroid to 100 mcg daily.  Recommend outpatient thyroid panel in 4 weeks.     Chronic atrial fibrillation Controlled.  On Coumadin for anticoagulation.   Hyperkalemia: Resolved.   Anemia of chronic disease: Hemoglobin stable at 9.    Acute metabolic encephalopathy probably from worsening sob.  Ct head without contrast ordered to rule out stroke.      DVT prophylaxis: scd's Code Status: Full code Family Communication: discussed with daughter and grand daughter at bedside.  Disposition Plan: home when clinically improved.   Consultants:   Orthopedics Dr. Doran Durand  Palliative care consult   Infectious Disease: Dr Linus Salmons   Procedures: None  Antimicrobials: IV vancomycin and cefepime discontinued.   Subjective: Lethargic, altered. Opened her eyes briefly.  Did not engage in conversation.   Objective: Vitals:   02/07/18 0500 02/07/18 1423 02/07/18 2212 02/08/18 1118  BP:  (!) 94/58 (!) 114/58   Pulse:  73 63   Resp:  18 17   Temp:  98.2 F (36.8 C) 98.8 F (37.1 C) 98.4 F (36.9 C)  TempSrc:  Oral Oral Axillary  SpO2:  100% 97% 98%  Weight: 49.4 kg (109 lb)     Height:        Intake/Output Summary (Last 24 hours) at 02/08/2018 1309 Last data filed at 02/08/2018 0800  Gross per 24 hour  Intake -  Output 600 ml  Net -600 ml   Filed Weights   02/05/18 0300 02/06/18 0620 02/07/18 0500  Weight: 55.3 kg (122 lb) 49.4 kg (109 lb) 49.4 kg (109 lb)    Examination:  General exam: lethargic, not in distress. Shivering.  Respiratory system: crackles at bases, no wheezing heard.  Cardiovascular system: S1 & S2 heard, irregular. No JVD, murmurs, Gastrointestinal system: Abdomen is  soft non tender non distended bowel sounds good.  Central nervous system: lethargic, opening her eyes to verbal cues, but not engaging in conversation. Extremities: pedal edemA, right non healing wound with exposed hardware on the right leg.  With right heel pressure ulcer.  No signs of cellulitis.  Skin: sacral pressure ulcer stage 1 Psychiatry: sleepy.     Data Reviewed: I have personally reviewed following labs and imaging studies  CBC: Recent Labs  Lab 02/02/18 1320 02/03/18 0436 02/03/18 0437 02/05/18 1157 02/08/18 1134  WBC 5.9 5.5  --  5.4 12.2*  NEUTROABS 4.3 4.4  --   --   --   HGB 8.8* 9.2*  --  9.0* 8.9*  HCT 28.3* 29.0* 28.8* 28.8* 28.3*  MCV 100.4* 96.7  --  98.0 98.3  PLT 311 334  --  323 979   Basic Metabolic Panel: Recent Labs  Lab 02/02/18 1320 02/03/18 0436 02/05/18 1157 02/06/18 0737 02/08/18 1134  NA 140 138 140 137 135  K 4.9 4.7 5.4* 4.8 4.6  CL 106 106 108 104 98  CO2 27 26 28 28 31   GLUCOSE 90 92 122* 71 122*  BUN 10 9 12 12 14   CREATININE 0.87 0.80 0.86 0.83 0.78  CALCIUM 8.7* 8.4* 8.8* 8.4* 8.8*   GFR: Estimated Creatinine Clearance: 37.2 mL/min (by C-G formula based on SCr of 0.78 mg/dL). Liver Function Tests: No results for input(s): AST, ALT, ALKPHOS, BILITOT, PROT, ALBUMIN in the last 168 hours. No results for input(s): LIPASE, AMYLASE in the last 168 hours. No results for input(s): AMMONIA in the last 168 hours. Coagulation Profile: Recent Labs  Lab 02/04/18 0525 02/05/18 0701 02/06/18 0947 02/07/18 0451 02/08/18 0713  INR 1.37 1.89 1.80 1.93 2.10   Cardiac Enzymes: No results for input(s): CKTOTAL, CKMB, CKMBINDEX, TROPONINI in the last 168 hours. BNP (last 3 results) No results for input(s): PROBNP in the last 8760 hours. HbA1C: No results for input(s): HGBA1C in the last 72 hours. CBG: No results for input(s): GLUCAP in the last 168 hours. Lipid Profile: No results for input(s): CHOL, HDL, LDLCALC, TRIG, CHOLHDL,  LDLDIRECT in the last 72 hours. Thyroid Function Tests: Recent Labs    02/06/18 1932  FREET4 0.78*  T3FREE 1.1*   Anemia Panel: No results for input(s): VITAMINB12, FOLATE, FERRITIN, TIBC, IRON, RETICCTPCT in the last 72 hours. Sepsis Labs: Recent Labs  Lab 02/02/18 1153 02/02/18 1426  LATICACIDVEN 2.01* 1.28    Recent Results (from the past 240 hour(s))  Blood culture (routine x 2)     Status: None   Collection Time: 02/02/18 11:53 AM  Result Value Ref Range Status   Specimen Description BLOOD RIGHT WRIST  Final   Special Requests   Final    BOTTLES DRAWN AEROBIC AND ANAEROBIC Blood Culture results may not be optimal due to an inadequate volume of blood received in culture bottles   Culture   Final    NO GROWTH 5 DAYS Performed at [facility], [place] [address] [place], [place] [address]  Report Status 02/07/2018 FINAL  Final  Blood culture (routine x 2)     Status: None   Collection Time: 02/02/18  1:30 PM  Result Value Ref Range Status   Specimen Description BLOOD LEFT ANTECUBITAL  Final   Special Requests   Final    BOTTLES DRAWN AEROBIC AND ANAEROBIC Blood Culture adequate volume   Culture   Final    NO GROWTH 5 DAYS Performed at [facility], [place] [address] [place], [place] [address]    Report Status 02/07/2018 FINAL  Final  MRSA PCR Screening     Status: Abnormal   Collection Time: 02/02/18 10:30 PM  Result Value Ref Range Status   MRSA by PCR POSITIVE (A) NEGATIVE Final    Comment:        The GeneXpert MRSA Assay (FDA approved for NASAL specimens only), is one component of a comprehensive MRSA colonization surveillance program. It is not intended to diagnose MRSA infection nor to guide or monitor treatment for MRSA infections. RESULT CALLED TO, READ BACK BY AND VERIFIED WITH: Rosalie Doctor RN 02/03/18 0248 JDW Performed at [facility] [address], [place], [place] [address]          Radiology Studies: Dg Chest Port 1  View  Result Date: 02/08/2018 CLINICAL DATA:  Shortness of breath. The patient is not able to give additional history. History of COPD, CHF, atrial fibrillation, former smoker. EXAM: PORTABLE CHEST 1 VIEW COMPARISON:  PA and lateral chest x-ray of February 02, 2018 FINDINGS: The lungs are reasonably well inflated. There are moderate-sized bilateral pleural effusions. The cardiac silhouette is enlarged. The pulmonary vascularity is indistinct. The interstitial markings are coarse. There is calcification in the wall of the aortic arch. IMPRESSION: CHF with mild interstitial edema and small to moderate-sized bilateral pleural effusions. No discrete pneumonia. Thoracic aortic atherosclerosis. Electronically Signed   By: David  Martinique M.D.   On: 02/08/2018 12:38        Scheduled Meds: . amoxicillin-clavulanate  1 tablet Oral Q12H  . busPIRone  7.5 mg Oral BID  . dorzolamide  1 drop Left Eye BID  . feeding supplement (ENSURE ENLIVE)  237 mL Oral TID BM  . FLUoxetine  40 mg Oral Daily  . furosemide  20 mg Intravenous Q12H  . gabapentin  600 mg Oral BID  . levothyroxine  100 mcg Oral QAC breakfast  . methocarbamol  500 mg Oral BID  . mirtazapine  7.5 mg Oral QPM  . multivitamin with minerals  1 tablet Oral Daily  . pantoprazole  40 mg Oral Daily  . pilocarpine  1 drop Left Eye BID  . rOPINIRole  1 mg Oral QPM  . sodium chloride flush  3 mL Intravenous Q12H  . sodium chloride flush  3 mL Intravenous Q12H  . timolol  1 drop Left Eye BID  . Travoprost (BAK Free)  1 drop Left Eye QHS  . warfarin  2 mg Oral q1800  . Warfarin - Pharmacist Dosing Inpatient   Does not apply q1800   Continuous Infusions: . sodium chloride       LOS: 6 days    Time spent: 35 minutes    Hosie Poisson, MD [facility] Pager [phone]  If 7PM-7AM, please contact night-coverage [email or web address] Password [facility] 02/08/2018, 1:09 PM

## 2018-02-08 NOTE — Progress Notes (Signed)
ANTICOAGULATION CONSULT NOTE - Follow Up Consult  Pharmacy Consult for Coumadin Indication: atrial fibrillation  No Known Allergies  Patient Measurements: Height: 5\' 7"  (170.2 cm) Weight: 109 lb (49.4 kg) IBW/kg (Calculated) : 61.6 Heparin Dosing Weight:    Vital Signs:    Labs: Recent Labs    02/05/18 1157 02/06/18 0737 02/06/18 0947 02/07/18 0451 02/08/18 0713  HGB 9.0*  --   --   --   --   HCT 28.8*  --   --   --   --   PLT 323  --   --   --   --   LABPROT  --   --  20.7* 21.9* 23.4*  INR  --   --  1.80 1.93 2.10  CREATININE 0.86 0.83  --   --   --     Estimated Creatinine Clearance: 35.8 mL/min (by C-G formula based on SCr of 0.83 mg/dL).   Assessment:  Anticoag: Warfarin for hx afib - INR 2.1, Hgb 9, plts WNL, no bleeding noted (PTA 1mg  daily). Admit INR 1.37  Goal of Therapy:  INR 2-3 Monitor platelets by anticoagulation protocol: Yes   Plan:  Warfarin 2mg  q 1800 Daily INR  [name] S. [name], PharmD, BCPS Clinical Staff Pharmacist Pager [phone]  [name] 02/08/2018,11:02 AM

## 2018-02-09 DIAGNOSIS — Z967 Presence of other bone and tendon implants: Secondary | ICD-10-CM

## 2018-02-09 DIAGNOSIS — I480 Paroxysmal atrial fibrillation: Secondary | ICD-10-CM

## 2018-02-09 LAB — PROTIME-INR
INR: 2.44
PROTHROMBIN TIME: 26.3 s — AB (ref 11.4–15.2)

## 2018-02-09 MED ORDER — SODIUM CHLORIDE 0.9 % IV BOLUS
250.0000 mL | Freq: Once | INTRAVENOUS | Status: AC
  Administered 2018-02-09: 250 mL via INTRAVENOUS

## 2018-02-09 MED ORDER — FUROSEMIDE 10 MG/ML IJ SOLN: 40.0000 mg | Freq: Two times a day (BID) | INTRAMUSCULAR | Status: DC

## 2018-02-09 MED ORDER — GABAPENTIN 600 MG PO TABS
300.0000 mg | ORAL_TABLET | Freq: Two times a day (BID) | ORAL | Status: DC
  Administered 2018-02-09 – 2018-02-11 (×4): 300 mg via ORAL
  Filled 2018-02-09 (×4): qty 1

## 2018-02-09 NOTE — Progress Notes (Signed)
Pt's BP 94/51, no acute distress noted. Lasix scheduled this AM, dose held and paged K.Schoor, NP to advise on further orders.

## 2018-02-09 NOTE — Progress Notes (Signed)
ANTICOAGULATION CONSULT NOTE - Follow Up Consult  Pharmacy Consult for Coumadin Indication: atrial fibrillation  No Known Allergies  Patient Measurements: Height: 5\' 7"  (170.2 cm) Weight: 109 lb (49.4 kg) IBW/kg (Calculated) : 61.6 Heparin Dosing Weight:    Vital Signs: BP: 94/51 (06/26 0651) Pulse Rate: 84 (06/26 0651)  Labs: Recent Labs    02/07/18 0451 02/08/18 0713 02/08/18 1134 02/09/18 0611  HGB  --   --  8.9*  --   HCT  --   --  28.3*  --   PLT  --   --  299  --   LABPROT 21.9* 23.4*  --  26.3*  INR 1.93 2.10  --  2.44  CREATININE  --   --  0.78  --     Estimated Creatinine Clearance: 37.2 mL/min (by C-G formula based on SCr of 0.78 mg/dL).   Assessment:  Anticoag: Warfarin for hx afib - INR 2.44, Hgb 8.9, plts WNL, no bleeding noted - (PTA 1mg  daily). Admit INR only 1.37  Goal of Therapy:  INR 2-3 Monitor platelets by anticoagulation protocol: Yes   Plan:  Warfarin 2mg  q 1800 Daily INR  Ramanda Paules [name], PharmD, BCPS Clinical Staff Pharmacist Pager [phone]  [name] 02/09/2018,11:16 AM

## 2018-02-09 NOTE — Consult Note (Addendum)
[facility] Nurse wound consult note Assessment completed in [place] in the presence of the patient's granddaughter. Sacral foam dressing in place at time of assessment.  Patient on air mattress with Dermatherapy underpad topped with a disposble under pad. Reason for Consult: Sacral wound Wound type: Unstageable Pressure Injury POA: No Measurement: 1.0 cm x 0.4 cm x unknown depth Wound bed:100 % yellow slough Drainage (amount, consistency, odor) None on dressing, no odor Periwound: Erythematous but blanches Dressing procedure/placement/frequency: Hydrocolloid dressing (available in clean utility). Change every 5 days and prn Also, Use ONLY ONE Dermatherapy pad beneath patient's buttocks.  Do NOT use disposable pads. And, turn every 2 hours. Monitor the wound area(s) for worsening of condition such as: Signs/symptoms of infection,  Increase in size,  Development of or worsening of odor, Development of pain, or increased pain at the affected locations.  Notify the medical team if any of these develop.  Thank you for the consult.  Discussed plan of care with the patient's granddaugther and bedside nurse.  Val Riles, RN, MSN, CWOCN, CNS-BC, pager [phone]

## 2018-02-09 NOTE — Progress Notes (Addendum)
PROGRESS NOTE    [name]  LNL:[id number] DOB: 05/14/1927 DOA: 02/02/2018 PCP: Clovia Cuff, MD    Brief Narrative:  [name] is a 84 y.o. female with medical history significant for hypothyroidism, anxiety, agitation, chronic systolic CHF, atrial fibrillation on warfarin, and COPD with chronic hypoxic respiratory failure, and nonhealing right leg wound now presenting to the emergency department for evaluation of severe leg pains and nonhealing wound.    Assessment & Plan:   Principal Problem:   Wound infection complicating hardware ([place]) Active Problems:   Atrial fibrillation (HCC)   Hypothyroidism   COPD (chronic obstructive pulmonary disease) (HCC)   Chronic systolic CHF (congestive heart failure) (HCC)   Chronic respiratory failure with hypoxia (HCC)   Macrocytic anemia   Anxiety   Agitation   Anticoagulated on Coumadin   Chronic nonhealing right leg wound with exposed hardware: Dr. Doran Durand was consulted recommended conservative management with IV antibiotics at this time. Patient is in constant pain and required pain medications. Palliative care consulted for goals of care, family wanted to continue with aggressive care at this time.  Blood cultures ordered and are pending,negative so far.  Currently the wound area does not show any signs of cellulitis and no purulent discharge.  Discontinued IV antibiotics, and she remains stable.  No fever or leukocytosis.  Discharge home with  Home health with air mattress. Palliative care following, they recommend palliative care at discharge and transition to hospice in the outpatient setting    Dysphagia Coughing on drinking liquids, sob this am and crackles at bases.  SLP did MBS  Suspect some aspiration component , added Augmentin for aspiration pneumonitis.  Daughter does not want tube  Feeding  per palliative care Diet recommendations: Dysphagia 1 (puree);Thin liquid  COPD No wheezing heard, resume albuterol  inhaler as needed.   Mild Acute on Chronic systolic heart failure: Sob this am and crackles at bases. On 3 lit of [place] oxygen.  CXR shows mild CHF , with bilateral effusions. increase IV lasix 40 mg BID and watch for urine output.  Strict intake and output.    Chronic respiratory failure with hypoxia probably secondary to COPD and CHF exacerbation.  At baseline she uses on 3 L of nasal cannula oxygen.   Hypothyroidism: abnormal TSH, free t4 and freet3 ordered.  Increased synthroid to 100 mcg daily.  Recommend outpatient thyroid panel in 4 weeks.     Chronic atrial fibrillation Controlled.  On Coumadin for anticoagulation.   Hyperkalemia: Resolved.   Anemia of chronic disease: Hemoglobin stable at 9.    Acute metabolic encephalopathy probably from worsening sob.  Ct head without contrast ordered to rule out stroke was negative .  UA negative 6/19 CXR no discrete PNA  Will reduce gabapentin , hold requip ,prozac and buspar    DVT prophylaxis: scd's Code Status: Full code Family Communication: discussed with daughter and grand daughter at bedside.  Disposition Plan: home when able to set up home health services  Consultants:   Orthopedics Dr. Doran Durand  Palliative care consult   Infectious Disease: Dr Linus Salmons   Procedures: None  Antimicrobials: IV vancomycin and cefepime discontinued.   Subjective:  Patient appears lethargic,  Eating and drinking poorly,  Objective: Vitals:   02/08/18 1450 02/08/18 1957 02/08/18 2200 02/09/18 0651  BP: 111/90  118/88 (!) 94/51  Pulse: 94  88 84  Resp: 20 18  20   Temp: 97.8 F (36.6 C)     TempSrc: Oral  SpO2: 93% 99%  96%  Weight:      Height:        Intake/Output Summary (Last 24 hours) at 02/09/2018 1209 Last data filed at 02/08/2018 2000 Gross per 24 hour  Intake -  Output 150 ml  Net -150 ml   Filed Weights   02/05/18 0300 02/06/18 0620 02/07/18 0500  Weight: 55.3 kg (122 lb) 49.4 kg (109 lb) 49.4 kg (109  lb)    Examination:  General exam: lethargic, not in distress. Shivering.  Respiratory system: crackles at bases, no wheezing heard.  Cardiovascular system: S1 & S2 heard, irregular. No JVD, murmurs, Gastrointestinal system: Abdomen is soft non tender non distended bowel sounds good.  Central nervous system: lethargic, opening her eyes to verbal cues, but not engaging in conversation. Extremities: pedal edemA, right non healing wound with exposed hardware on the right leg.  With right heel pressure ulcer.  No signs of cellulitis.  Skin: sacral pressure ulcer stage 1 Psychiatry: sleepy.     Data Reviewed: I have personally reviewed following labs and imaging studies  CBC: Recent Labs  Lab 02/02/18 1320 02/03/18 0436 02/03/18 0437 02/05/18 1157 02/08/18 1134  WBC 5.9 5.5  --  5.4 12.2*  NEUTROABS 4.3 4.4  --   --   --   HGB 8.8* 9.2*  --  9.0* 8.9*  HCT 28.3* 29.0* 28.8* 28.8* 28.3*  MCV 100.4* 96.7  --  98.0 98.3  PLT 311 334  --  323 924   Basic Metabolic Panel: Recent Labs  Lab 02/02/18 1320 02/03/18 0436 02/05/18 1157 02/06/18 0737 02/08/18 1134  NA 140 138 140 137 135  K 4.9 4.7 5.4* 4.8 4.6  CL 106 106 108 104 98  CO2 27 26 28 28 31   GLUCOSE 90 92 122* 71 122*  BUN 10 9 12 12 14   CREATININE 0.87 0.80 0.86 0.83 0.78  CALCIUM 8.7* 8.4* 8.8* 8.4* 8.8*   GFR: Estimated Creatinine Clearance: 37.2 mL/min (by C-G formula based on SCr of 0.78 mg/dL). Liver Function Tests: No results for input(s): AST, ALT, ALKPHOS, BILITOT, PROT, ALBUMIN in the last 168 hours. No results for input(s): LIPASE, AMYLASE in the last 168 hours. No results for input(s): AMMONIA in the last 168 hours. Coagulation Profile: Recent Labs  Lab 02/05/18 0701 02/06/18 0947 02/07/18 0451 02/08/18 0713 02/09/18 0611  INR 1.89 1.80 1.93 2.10 2.44   Cardiac Enzymes: No results for input(s): CKTOTAL, CKMB, CKMBINDEX, TROPONINI in the last 168 hours. BNP (last 3 results) No results for  input(s): PROBNP in the last 8760 hours. HbA1C: No results for input(s): HGBA1C in the last 72 hours. CBG: No results for input(s): GLUCAP in the last 168 hours. Lipid Profile: No results for input(s): CHOL, HDL, LDLCALC, TRIG, CHOLHDL, LDLDIRECT in the last 72 hours. Thyroid Function Tests: Recent Labs    02/06/18 1932  FREET4 0.78*  T3FREE 1.1*   Anemia Panel: No results for input(s): VITAMINB12, FOLATE, FERRITIN, TIBC, IRON, RETICCTPCT in the last 72 hours. Sepsis Labs: Recent Labs  Lab 02/02/18 1426  LATICACIDVEN 1.28    Recent Results (from the past 240 hour(s))  Blood culture (routine x 2)     Status: None   Collection Time: 02/02/18 11:53 AM  Result Value Ref Range Status   Specimen Description BLOOD RIGHT WRIST  Final   Special Requests   Final    BOTTLES DRAWN AEROBIC AND ANAEROBIC Blood Culture results may not be optimal due to an inadequate volume of  blood received in culture bottles   Culture   Final    NO GROWTH 5 DAYS Performed at [facility], [place] [address], [place], [place] [address]    Report Status 02/07/2018 FINAL  Final  Blood culture (routine x 2)     Status: None   Collection Time: 02/02/18  1:30 PM  Result Value Ref Range Status   Specimen Description BLOOD LEFT ANTECUBITAL  Final   Special Requests   Final    BOTTLES DRAWN AEROBIC AND ANAEROBIC Blood Culture adequate volume   Culture   Final    NO GROWTH 5 DAYS Performed at [facility], [place] [address], [place], [place] [address]    Report Status 02/07/2018 FINAL  Final  MRSA PCR Screening     Status: Abnormal   Collection Time: 02/02/18 10:30 PM  Result Value Ref Range Status   MRSA by PCR POSITIVE (A) NEGATIVE Final    Comment:        The GeneXpert MRSA Assay (FDA approved for NASAL specimens only), is one component of a comprehensive MRSA colonization surveillance program. It is not intended to diagnose MRSA infection nor to guide or monitor treatment for MRSA  infections. RESULT CALLED TO, READ BACK BY AND VERIFIED WITH: Rosalie Doctor RN 02/03/18 0248 JDW Performed at [facility] [address], [place], [place] [address]          Radiology Studies: Ct Head Wo Contrast  Result Date: 02/08/2018 CLINICAL DATA:  84 year old female with altered mental status. Initial encounter. EXAM: CT HEAD WITHOUT CONTRAST TECHNIQUE: Contiguous axial images were obtained from the base of the skull through the vertex without intravenous contrast. COMPARISON:  07/03/2017 CT. FINDINGS: Brain: No intracranial hemorrhage or CT evidence of large acute infarct. Prominent chronic microvascular changes. Moderate to marked global atrophy. No intracranial mass lesion noted on this unenhanced exam. Vascular: Vascular calcifications Skull: Negative Sinuses/Orbits: No acute orbital abnormality. Visualized paranasal sinuses are clear. Other: Complete opacification mastoid air cells and partial opacification middle ear cavities new from prior CT. No obstructing lesion of the eustachian tube is noted. No obvious intracranial extension. IMPRESSION: No intracranial hemorrhage or CT evidence of large acute infarct. Prominent chronic microvascular changes. Moderate to marked global atrophy. Complete opacification mastoid air cells and partial opacification middle ear cavities bilaterally is new from prior CT. No obstructing lesion of the eustachian tube is noted. Electronically Signed   By: Genia Del M.D.   On: 02/08/2018 13:51   Dg Chest Port 1 View  Result Date: 02/08/2018 CLINICAL DATA:  Shortness of breath. The patient is not able to give additional history. History of COPD, CHF, atrial fibrillation, former smoker. EXAM: PORTABLE CHEST 1 VIEW COMPARISON:  PA and lateral chest x-ray of February 02, 2018 FINDINGS: The lungs are reasonably well inflated. There are moderate-sized bilateral pleural effusions. The cardiac silhouette is enlarged. The pulmonary vascularity is indistinct. The  interstitial markings are coarse. There is calcification in the wall of the aortic arch. IMPRESSION: CHF with mild interstitial edema and small to moderate-sized bilateral pleural effusions. No discrete pneumonia. Thoracic aortic atherosclerosis. Electronically Signed   By: David  Martinique M.D.   On: 02/08/2018 12:38        Scheduled Meds: . amoxicillin-clavulanate  1 tablet Oral Q12H  . busPIRone  7.5 mg Oral BID  . dorzolamide  1 drop Left Eye BID  . feeding supplement (ENSURE ENLIVE)  237 mL Oral TID BM  . FLUoxetine  40 mg Oral Daily  . furosemide  20 mg Intravenous Q12H  . gabapentin  600 mg Oral BID  . levothyroxine  100 mcg Oral QAC breakfast  . methocarbamol  500 mg Oral BID  . mirtazapine  7.5 mg Oral QPM  . multivitamin with minerals  1 tablet Oral Daily  . pantoprazole  40 mg Oral Daily  . pilocarpine  1 drop Left Eye BID  . rOPINIRole  1 mg Oral QPM  . sodium chloride flush  3 mL Intravenous Q12H  . sodium chloride flush  3 mL Intravenous Q12H  . timolol  1 drop Left Eye BID  . Travoprost (BAK Free)  1 drop Left Eye QHS  . warfarin  2 mg Oral q1800  . Warfarin - Pharmacist Dosing Inpatient   Does not apply q1800   Continuous Infusions: . sodium chloride       LOS: 7 days    Time spent: 35 minutes    Reyne Dumas, MD [facility] Pager [phone]  If 7PM-7AM, please contact night-coverage [email or web address] Password [facility] 02/09/2018, 12:09 PM

## 2018-02-09 NOTE — Consult Note (Signed)
            [facility] CM Primary Care Navigator  02/09/2018  [name] 16-Sep-1926 [id number]   Seen patient and granddaughter [name]) at the bedsideto identify possible discharge needs.  Per chart review, patientpresented for evaluation of severe leg pains and nonhealing wound that resulted to this admission.  Patient's granddaughter confirmed thatprimary care provider is Dr.Philip Asenso with [facility] which is not under [facility] network.  Patient's granddaughterencouraged to follow-up with patient's primary care providerafter hospital discharge or when patient returns back home in order to help manageherhealth issues. Granddaughter reports that Dr. Daphene Jaeger will be following patient up at home upon discharge from the hospital.   For additional questions please contact:  Edwena Felty A. Lastacia Solum, BSN, RN-BC [facility] PRIMARY CARE Navigator Cell: [phone]

## 2018-02-09 NOTE — Progress Notes (Signed)
  Speech Language Pathology Treatment: Dysphagia  Patient Details Name: [name] MRN: [id number] DOB: 1927/04/27 Today's Date: 02/09/2018 Time: 6270-3500 SLP Time Calculation (min) (ACUTE ONLY): 20 min  Assessment / Plan / Recommendation Clinical Impression  Dysphagia treatment provided for diet tolerance check. Subjectively pt appeared more lethargic today compared to previous visit Monday. Pt tolerated puree without overt s/s of aspiration; today showing inconsistent weak cough following sips of thin liquid. Attempted trial of soft fruit but pt unable to masticate and ultimately spit back out. Granddaughter at bedside reports that cough has been fairly persistent during meals. Recommend downgrading diet to dysphagia 1/ thin and proceeding with MBS to objectively evaluate swallow function. Reviewed strategies with granddaughter based on previous MBS results and findings of esophageal dysmotility- offering small amounts of food/ liquid at a time, upright 60 minutes after meal, small bites/ sips. Will continue to follow.  HPI HPI: Pt is an 84 y.o. female with PMH of hypothyroidism, anxiety, agitation, chronic systolic CHF, atrial fibrillation on warfarin, and COPD with chronic hypoxic respiratory failure, and nonhealing right leg wound now presenting to the emergency department for evaluation of severe leg pains and nonhealing wound.  Patient underwent ORIF of the right ankle in 2012 and says subsequently been complicated by poor wound healing.  Patient was previously managed in the wound care clinic and has been treated with multiple doses of oral antibiotics.  She is not currently on antibiotics.  Family reports that she complains of severe pain at the site, which they described as worsening with exposure of underlying surgical hardware and bone. Pt has had significant weight loss over past year, diet reportedly primarily limited to tomato soup, ice cream, and ensure with coffee. Palliative care has  been consulted. CXR 6/19- Pulmonary vascular congestion. Pleural effusions bilaterally. Areas of patchy atelectasis bilaterally but no appreciable edema or frank consolidation. Pt has had recurrent PNAs and followed by ST in the past. Most recent MBS 07/05/17- primary concern is stasis in cervical esophagus of solids and liquids along with flash penetration; recommended dysphagia 2/ thin liquids, meds crushed. On this admission bedside swallow eval ordered due to coughing on liquids.       SLP Plan  MBS       Recommendations  Diet recommendations: Dysphagia 1 (puree);Thin liquid Liquids provided via: Cup;Straw Medication Administration: Crushed with puree Supervision: Staff to assist with self feeding;Full supervision/cueing for compensatory strategies Compensations: Slow rate;Small sips/bites Postural Changes and/or Swallow Maneuvers: Seated upright 90 degrees;Upright 30-60 min after meal                Oral Care Recommendations: Oral care BID Follow up Recommendations: Skilled Nursing facility SLP Visit Diagnosis: Dysphagia, pharyngoesophageal phase (R13.14) Plan: Francis, [place], CCC-SLP 02/09/2018, 12:30 PM  [id number]

## 2018-02-09 NOTE — Progress Notes (Addendum)
Daily Progress Note   Patient Name: [name]       Date: 02/09/2018 DOB: Jun 04, 1927  Age: 84 y.o. MRN#: [id number] Attending Physician: Reyne Dumas, MD Primary Care Physician: Clovia Cuff, MD Admit Date: 02/02/2018  Reason for Consultation/Follow-up: Establishing goals of care  Subjective: Patient resting in bed. She opens eyes briefly and closes them; she appears lethargic. Daughter and granddaughter are at bedside, they state she is better today than yesterday as yesterday she was very confused and did not really eat. [place] Janus states her mother had crackles and lasix was initiated, as well as a head scan due to her lethargy.  She verbalizes frustration with obtaining an air mattress for home.  Granddaughter excused herself from conversation stating "we've been worse than this before".  [place] Janus states they would not want a feeding tube placed.   Upon discussing concerns for prognosis, daughter states they have seen this all with her mother before, and they will see how she progresses. She states her mother will tell her when she wants to stop.    Recommend palliative at discharge with transition to hospice.   Length of Stay: 7  Current Medications: Scheduled Meds:  . amoxicillin-clavulanate  1 tablet Oral Q12H  . busPIRone  7.5 mg Oral BID  . dorzolamide  1 drop Left Eye BID  . feeding supplement (ENSURE ENLIVE)  237 mL Oral TID BM  . FLUoxetine  40 mg Oral Daily  . furosemide  20 mg Intravenous Q12H  . gabapentin  600 mg Oral BID  . levothyroxine  100 mcg Oral QAC breakfast  . methocarbamol  500 mg Oral BID  . mirtazapine  7.5 mg Oral QPM  . multivitamin with minerals  1 tablet Oral Daily  . pantoprazole  40 mg Oral Daily  . pilocarpine  1 drop Left Eye BID  . rOPINIRole  1 mg  Oral QPM  . sodium chloride flush  3 mL Intravenous Q12H  . sodium chloride flush  3 mL Intravenous Q12H  . timolol  1 drop Left Eye BID  . Travoprost (BAK Free)  1 drop Left Eye QHS  . warfarin  2 mg Oral q1800  . Warfarin - Pharmacist Dosing Inpatient   Does not apply q1800    Continuous Infusions: . sodium chloride  PRN Meds: sodium chloride, acetaminophen **OR** acetaminophen, albuterol, bisacodyl, guaiFENesin, HYDROcodone-acetaminophen, ondansetron **OR** ondansetron (ZOFRAN) IV, senna-docusate, sodium chloride flush  Physical Exam  Constitutional:  Frail and thin  Pulmonary/Chest: Effort normal.  Neurological:  Opens eyes briefly and closes them.             Vital Signs: BP (!) 94/51 (BP Location: Right Arm)   Pulse 84   Temp 97.8 F (36.6 C) (Oral)   Resp 20   Ht 5\' 7"  (1.702 m)   Wt 49.4 kg (109 lb)   SpO2 96%   BMI 17.07 kg/m  SpO2: SpO2: 96 % O2 Device: O2 Device: Room Air O2 Flow Rate: O2 Flow Rate (L/min): 3 L/min  Intake/output summary:   Intake/Output Summary (Last 24 hours) at 02/09/2018 0953 Last data filed at 02/08/2018 2000 Gross per 24 hour  Intake -  Output 150 ml  Net -150 ml   LBM: Last BM Date: 02/07/18 Baseline Weight: Weight: 57.5 kg (126 lb 12.2 oz) Most recent weight: Weight: 49.4 kg (109 lb)       Palliative Assessment/Data: 30%      Patient Active Problem List   Diagnosis Date Noted  . Macrocytic anemia 02/02/2018  . Infection of prosthesis ([place]) 02/02/2018  . Anxiety 02/02/2018  . Agitation 02/02/2018  . Wound infection complicating hardware ([place]) 02/02/2018  . Anticoagulated on Coumadin   . Chronic systolic CHF (congestive heart failure) ([place]) 07/07/2017  . Chronic respiratory failure with hypoxia ([place]) 07/07/2017  . Aortic atherosclerosis ([place]) 07/07/2017  . Pressure injury of skin 04/01/2017  . Protein-calorie malnutrition, severe 04/01/2017  . Cardiomyopathy ([place]) 10/09/2016  . Encounter for hospice care  discussion   . Dysphagia   . COPD (chronic obstructive pulmonary disease) ([place]) 10/04/2016  . Debility   . Hypothyroidism   . Glaucoma   . Neuropathy   . Fall   . Atrial fibrillation ([place]) 03/02/2007  . PSORIASIS 03/02/2007    Palliative Care Assessment & Plan    Assessment/Recommendations/Plan:  Currently, daughter states family would not want feeding tube.  Continue other care measures.   Recommend palliative to follow outpatient with transition to hospice when ready.     Code Status:    Code Status Orders  (From admission, onward)        Start     Ordered   02/02/18 2109  Full code  Continuous     02/02/18 2110    Code Status History    Date Active Date Inactive Code Status Order ID Comments User Context   07/03/2017 2318 07/09/2017 1737 Partial Code [id number]  Arnell Asal, NP ED   07/03/2017 2022 07/03/2017 2318 Full Code [id number]  Arnell Asal, NP ED   03/31/2017 1950 04/09/2017 1957 Full Code [id number]  Shon Millet, DO Inpatient   10/08/2016 1251 10/16/2016 0029 DNR [id number]  Erick Colace, NP Inpatient   10/04/2016 0018 10/08/2016 1251 Full Code [id number]  Vianne Bulls, MD ED       Prognosis:  Poor overall.   Discharge Planning:  To Be Determined  Dr. Allyson Sabal updated.   Thank you for allowing the Palliative Medicine Team to assist in the care of this patient.   Total Time 35 min Prolonged Time Billed  NO       Greater than 50%  of this time was spent counseling and coordinating care related to the above assessment and plan.  Asencion Gowda, NP  Please contact Palliative Medicine Team phone at [phone]  for questions and concerns.

## 2018-02-09 NOTE — Progress Notes (Signed)
Patient noted to have beginnings of stage three wound on sacrum. Stage two wound noted on admission and treated using foam dressing. Every two hour turns being done by staff as patient allows. [place] nurse consulted. Patient could benefit from air overlay mattress on return home.

## 2018-02-09 NOTE — Progress Notes (Signed)
Attempted to give pt her pills crushed in puree, but pt experiencing coughing fit after taking small sip of water. Pt was seated at 90 degrees and under full supervision, but continues coughing. Concerned for aspiration and unable to administer the other half of crushed pills. Notified Baltazar Najjar, NP of concerns and will continue to monitor patient closely.

## 2018-02-10 ENCOUNTER — Inpatient Hospital Stay (HOSPITAL_COMMUNITY): Payer: Medicare Other

## 2018-02-10 DIAGNOSIS — T847XXA Infection and inflammatory reaction due to other internal orthopedic prosthetic devices, implants and grafts, initial encounter: Secondary | ICD-10-CM

## 2018-02-10 DIAGNOSIS — I481 Persistent atrial fibrillation: Secondary | ICD-10-CM

## 2018-02-10 LAB — PROTIME-INR
INR: 2.28
Prothrombin Time: 24.9 seconds — ABNORMAL HIGH (ref 11.4–15.2)

## 2018-02-10 MED ORDER — RESOURCE THICKENUP CLEAR PO POWD
ORAL | Status: DC | PRN
Start: 2018-02-10 — End: 2018-02-11
  Filled 2018-02-10: qty 125

## 2018-02-10 NOTE — Progress Notes (Signed)
PROGRESS NOTE    [name]  DGU:[id number] DOB: 07-12-1927 DOA: 02/02/2018 PCP: Clovia Cuff, MD    Brief Narrative:  [name] is a 84 y.o. female with medical history significant for hypothyroidism, anxiety, agitation, chronic systolic CHF, atrial fibrillation on warfarin, and COPD with chronic hypoxic respiratory failure, and nonhealing right leg wound now presenting to the emergency department for evaluation of severe leg pains and nonhealing wound. Significant decline since admission, lethargic, aspirating. Patient's granddaughter who is her caregiver currently  wants all active treatment would like to take patient home. Barrier to discharge   is inability to eat    Assessment & Plan:   Principal Problem:   Wound infection complicating hardware ([place]) Active Problems:   Atrial fibrillation (HCC)   Hypothyroidism   COPD (chronic obstructive pulmonary disease) (HCC)   Chronic systolic CHF (congestive heart failure) (HCC)   Chronic respiratory failure with hypoxia (HCC)   Macrocytic anemia   Anxiety   Agitation   Anticoagulated on Coumadin   Chronic nonhealing right leg wound with exposed hardware: Dr. Doran Durand was consulted recommended conservative management with IV antibiotics at this time. Patient is in constant pain and required pain medications. Palliative care consulted for goals of care, family wanted to continue with aggressive care at this time.  Blood cultures ordered and  negative so far.  Currently the wound area does not show any signs of cellulitis and no purulent discharge.  Discontinued IV antibiotics, and she remains stable.  No fever or leukocytosis. Not on any antibiotics for this particular indication. Discharge home with  Home health with air mattress when it is available. Case management has been requested to discuss with the patient's family.  Palliative care following, they recommend palliative care at discharge and transition to hospice in the  outpatient setting , family is not ready for this yet   Dysphagia Coughing on drinking liquids, sob this am and crackles at bases.  SLP did MBS  Suspect some aspiration component , added Augmentin for aspiration pneumonitis on 6/25. Continue for a total of 5 days Daughter does not want tube  Feeding  per palliative care Diet recommendations: Dysphagia 1 (puree);Thin liquid initially, patient continued to aspirate, modified barium swallow today  COPD No wheezing heard, resume albuterol inhaler as needed.   Mild Acute on Chronic systolic heart failure: Sob   and crackles at bases. On 3 lit of [place] oxygen.  CXR shows mild CHF , with bilateral effusions. increased IV lasix 40 mg BID however unable to tolerate Lasix due to soft blood pressure, did receive a normal saline bolus 6/26    Chronic respiratory failure with hypoxia probably secondary to COPD and CHF exacerbation.  At baseline she uses on 3 L of nasal cannula oxygen.   Hypothyroidism: abnormal TSH, free t4 and freet3 ordered.  Increased synthroid to 100 mcg daily.  Recommend outpatient thyroid panel in 4 weeks.     Chronic atrial fibrillation Controlled.  On Coumadin for anticoagulation.   Hyperkalemia: Resolved.   Anemia of chronic disease: Hemoglobin stable at 9.    Acute metabolic encephalopathy probably from worsening sob.  Ct head without contrast ordered to rule out stroke was negative .  UA negative 6/19 CXR no discrete PNA  Will reduce gabapentin , discontinued requip ,prozac and buspar   Patient is more awake and alert today   DVT prophylaxis: scd's Code Status: Full code Family Communication: discussed with daughter and grand daughter at bedside.  Disposition Plan: home  likely when feeding issues are resolved  Consultants:   Orthopedics Dr. Doran Durand  Palliative care consult   Infectious Disease: Dr Linus Salmons   Procedures: None  Antimicrobials:  Anti-infectives (From admission, onward)   Start      Dose/Rate Route Frequency Ordered Stop   02/08/18 1400  amoxicillin-clavulanate (AUGMENTIN) 875-125 MG per tablet 1 tablet     1 tablet Oral Every 12 hours 02/08/18 1309     02/03/18 2200  vancomycin (VANCOCIN) 500 mg in sodium chloride 0.9 % 100 mL IVPB  Status:  Discontinued     500 mg 100 mL/hr over 60 Minutes Intravenous Every 24 hours 02/02/18 2123 02/05/18 1347   02/03/18 2130  ceFEPIme (MAXIPIME) 1 g in sodium chloride 0.9 % 100 mL IVPB  Status:  Discontinued     1 g 200 mL/hr over 30 Minutes Intravenous Every 24 hours 02/02/18 2123 02/05/18 1347   02/02/18 2130  ceFEPIme (MAXIPIME) 2 g in sodium chloride 0.9 % 100 mL IVPB     2 g 200 mL/hr over 30 Minutes Intravenous  Once 02/02/18 2119 02/03/18 0230   02/02/18 2130  vancomycin (VANCOCIN) IVPB 1000 mg/200 mL premix     1,000 mg 200 mL/hr over 60 Minutes Intravenous  Once 02/02/18 2119 02/03/18 0438       Subjective: Keeping during my visit however was more awake this morning Had a modified barium swallow and placed back on a diet with nectar thick liquids  Objective: Vitals:   02/09/18 1726 02/09/18 2028 02/10/18 0413 02/10/18 0416  BP: 112/73 (!) 143/93  (!) 136/98  Pulse: 65 72  86  Resp:  18  20  Temp:    97.6 F (36.4 C)  TempSrc:      SpO2:  100%  95%  Weight:   44.9 kg (99 lb)   Height:        Intake/Output Summary (Last 24 hours) at 02/10/2018 0841 Last data filed at 02/09/2018 1900 Gross per 24 hour  Intake 368.24 ml  Output 200 ml  Net 168.24 ml   Filed Weights   02/06/18 0620 02/07/18 0500 02/10/18 0413  Weight: 49.4 kg (109 lb) 49.4 kg (109 lb) 44.9 kg (99 lb)    Examination:  General exam: lethargic, not in distress. Shivering.  Respiratory system: crackles at bases, no wheezing heard.  Cardiovascular system: S1 & S2 heard, irregular. No JVD, murmurs, Gastrointestinal system: Abdomen is soft non tender non distended bowel sounds good.  Central nervous system: lethargic, opening her eyes to  verbal cues, but not engaging in conversation. Extremities: pedal edemA, right non healing wound with exposed hardware on the right leg.  With right heel pressure ulcer.  No signs of cellulitis.  Skin: sacral pressure ulcer stage 1 Psychiatry: sleepy.     Data Reviewed: I have personally reviewed following labs and imaging studies  CBC: Recent Labs  Lab 02/05/18 1157 02/08/18 1134  WBC 5.4 12.2*  HGB 9.0* 8.9*  HCT 28.8* 28.3*  MCV 98.0 98.3  PLT 323 176   Basic Metabolic Panel: Recent Labs  Lab 02/05/18 1157 02/06/18 0737 02/08/18 1134  NA 140 137 135  K 5.4* 4.8 4.6  CL 108 104 98  CO2 28 28 31   GLUCOSE 122* 71 122*  BUN 12 12 14   CREATININE 0.86 0.83 0.78  CALCIUM 8.8* 8.4* 8.8*   GFR: Estimated Creatinine Clearance: 33.8 mL/min (by C-G formula based on SCr of 0.78 mg/dL). Liver Function Tests: No results for input(s): AST, ALT,  ALKPHOS, BILITOT, PROT, ALBUMIN in the last 168 hours. No results for input(s): LIPASE, AMYLASE in the last 168 hours. No results for input(s): AMMONIA in the last 168 hours. Coagulation Profile: Recent Labs  Lab 02/06/18 0947 02/07/18 0451 02/08/18 0713 02/09/18 0611 02/10/18 0630  INR 1.80 1.93 2.10 2.44 2.28   Cardiac Enzymes: No results for input(s): CKTOTAL, CKMB, CKMBINDEX, TROPONINI in the last 168 hours. BNP (last 3 results) No results for input(s): PROBNP in the last 8760 hours. HbA1C: No results for input(s): HGBA1C in the last 72 hours. CBG: No results for input(s): GLUCAP in the last 168 hours. Lipid Profile: No results for input(s): CHOL, HDL, LDLCALC, TRIG, CHOLHDL, LDLDIRECT in the last 72 hours. Thyroid Function Tests: No results for input(s): TSH, T4TOTAL, FREET4, T3FREE, THYROIDAB in the last 72 hours. Anemia Panel: No results for input(s): VITAMINB12, FOLATE, FERRITIN, TIBC, IRON, RETICCTPCT in the last 72 hours. Sepsis Labs: No results for input(s): PROCALCITON, LATICACIDVEN in the last 168  hours.  Recent Results (from the past 240 hour(s))  Blood culture (routine x 2)     Status: None   Collection Time: 02/02/18 11:53 AM  Result Value Ref Range Status   Specimen Description BLOOD RIGHT WRIST  Final   Special Requests   Final    BOTTLES DRAWN AEROBIC AND ANAEROBIC Blood Culture results may not be optimal due to an inadequate volume of blood received in culture bottles   Culture   Final    NO GROWTH 5 DAYS Performed at [facility], [place] [address], [place], [place] [address]    Report Status 02/07/2018 FINAL  Final  Blood culture (routine x 2)     Status: None   Collection Time: 02/02/18  1:30 PM  Result Value Ref Range Status   Specimen Description BLOOD LEFT ANTECUBITAL  Final   Special Requests   Final    BOTTLES DRAWN AEROBIC AND ANAEROBIC Blood Culture adequate volume   Culture   Final    NO GROWTH 5 DAYS Performed at [facility], [place] [address], [place], [place] [address]    Report Status 02/07/2018 FINAL  Final  MRSA PCR Screening     Status: Abnormal   Collection Time: 02/02/18 10:30 PM  Result Value Ref Range Status   MRSA by PCR POSITIVE (A) NEGATIVE Final    Comment:        The GeneXpert MRSA Assay (FDA approved for NASAL specimens only), is one component of a comprehensive MRSA colonization surveillance program. It is not intended to diagnose MRSA infection nor to guide or monitor treatment for MRSA infections. RESULT CALLED TO, READ BACK BY AND VERIFIED WITH: Rosalie Doctor RN 02/03/18 0248 JDW Performed at [facility] [address], [place], [place] [address]          Radiology Studies: Ct Head Wo Contrast  Result Date: 02/08/2018 CLINICAL DATA:  84 year old female with altered mental status. Initial encounter. EXAM: CT HEAD WITHOUT CONTRAST TECHNIQUE: Contiguous axial images were obtained from the base of the skull through the vertex without intravenous contrast. COMPARISON:  07/03/2017 CT. FINDINGS: Brain: No  intracranial hemorrhage or CT evidence of large acute infarct. Prominent chronic microvascular changes. Moderate to marked global atrophy. No intracranial mass lesion noted on this unenhanced exam. Vascular: Vascular calcifications Skull: Negative Sinuses/Orbits: No acute orbital abnormality. Visualized paranasal sinuses are clear. Other: Complete opacification mastoid air cells and partial opacification middle ear cavities new from prior CT. No obstructing lesion  of the eustachian tube is noted. No obvious intracranial extension. IMPRESSION: No intracranial hemorrhage or CT evidence of large acute infarct. Prominent chronic microvascular changes. Moderate to marked global atrophy. Complete opacification mastoid air cells and partial opacification middle ear cavities bilaterally is new from prior CT. No obstructing lesion of the eustachian tube is noted. Electronically Signed   By: Genia Del M.D.   On: 02/08/2018 13:51   Dg Chest Port 1 View  Result Date: 02/08/2018 CLINICAL DATA:  Shortness of breath. The patient is not able to give additional history. History of COPD, CHF, atrial fibrillation, former smoker. EXAM: PORTABLE CHEST 1 VIEW COMPARISON:  PA and lateral chest x-ray of February 02, 2018 FINDINGS: The lungs are reasonably well inflated. There are moderate-sized bilateral pleural effusions. The cardiac silhouette is enlarged. The pulmonary vascularity is indistinct. The interstitial markings are coarse. There is calcification in the wall of the aortic arch. IMPRESSION: CHF with mild interstitial edema and small to moderate-sized bilateral pleural effusions. No discrete pneumonia. Thoracic aortic atherosclerosis. Electronically Signed   By: David  Martinique M.D.   On: 02/08/2018 12:38        Scheduled Meds: . amoxicillin-clavulanate  1 tablet Oral Q12H  . dorzolamide  1 drop Left Eye BID  . feeding supplement (ENSURE ENLIVE)  237 mL Oral TID BM  . gabapentin  300 mg Oral BID  . levothyroxine   100 mcg Oral QAC breakfast  . methocarbamol  500 mg Oral BID  . mirtazapine  7.5 mg Oral QPM  . multivitamin with minerals  1 tablet Oral Daily  . pantoprazole  40 mg Oral Daily  . pilocarpine  1 drop Left Eye BID  . sodium chloride flush  3 mL Intravenous Q12H  . sodium chloride flush  3 mL Intravenous Q12H  . timolol  1 drop Left Eye BID  . Travoprost (BAK Free)  1 drop Left Eye QHS  . warfarin  2 mg Oral q1800  . Warfarin - Pharmacist Dosing Inpatient   Does not apply q1800   Continuous Infusions: . sodium chloride       LOS: 8 days    Time spent: 35 minutes    Reyne Dumas, MD [facility] Pager [phone]  If 7PM-7AM, please contact night-coverage [email or web address] Password [facility] 02/10/2018, 8:41 AM

## 2018-02-10 NOTE — Progress Notes (Signed)
Modified Barium Swallow Progress Note  Patient Details  Name: [name] MRN: [id number] Date of Birth: 1927-03-28  Today's Date: 02/10/2018  Modified Barium Swallow completed.  Full report located under Chart Review in the Imaging Section.  Brief recommendations include the following:  Clinical Impression  Patient presents with a mild-moderate, primarily pharyngeal phase dysphagia, characterized by delayed swallow initiation with thin liquids reaching the level of the pyriform sinuses prior to initiating a swallow. Result is deep flash penetration and eventual silent aspiration. With both nectar thick liquid trials and thin liquid via tsp, patient able to consistently protect her airway. Pharyngeal strength intact. Oral phase functional but prolonged with soft solid bolus. Will advance solids and downgrade liquids for now for decreased aspiration risk. Will consider trial of thin liquids via Provale cup to allow 5cc for hopeful advancement back to thin liquids. Combination of cognitive and hearing status may prohibit correct use. Will f/u.    Swallow Evaluation Recommendations       SLP Diet Recommendations: Dysphagia 2 (Fine chop) solids;Nectar thick liquid   Liquid Administration via: Cup;No straw   Medication Administration: Crushed with puree   Supervision: Patient able to self feed;Full supervision/cueing for compensatory strategies;Staff to assist with self feeding   Compensations: Slow rate;Small sips/bites;Follow solids with liquid   Postural Changes: Seated upright at 90 degrees   Oral Care Recommendations: Oral care BID   Other Recommendations: Order thickener from pharmacy;Prohibited food (jello, ice cream, thin soups);Remove water pitcher  Gabriel Rainwater MA, CCC-SLP [phone]   Skylur Fuston Meryl 02/10/2018,9:07 AM

## 2018-02-10 NOTE — Progress Notes (Signed)
NCM received call from [facility] . [place] liaison informed NCM pt has been approved for low air loss mattress and will be contacting family for delivery. NCM made family aware. Whitman Hero RN,BSN,CM

## 2018-02-10 NOTE — Progress Notes (Signed)
ANTICOAGULATION CONSULT NOTE - Follow Up Consult  Pharmacy Consult for Coumadin Indication: atrial fibrillation  No Known Allergies  Patient Measurements: Height: 5\' 7"  (170.2 cm) Weight: 99 lb (44.9 kg) IBW/kg (Calculated) : 61.6 Heparin Dosing Weight:    Vital Signs: Temp: 97.6 F (36.4 C) (06/27 0416) BP: 136/98 (06/27 0416) Pulse Rate: 86 (06/27 0416)  Labs: Recent Labs    02/08/18 0713 02/08/18 1134 02/09/18 0611 02/10/18 0630  HGB  --  8.9*  --   --   HCT  --  28.3*  --   --   PLT  --  299  --   --   LABPROT 23.4*  --  26.3* 24.9*  INR 2.10  --  2.44 2.28  CREATININE  --  0.78  --   --     Estimated Creatinine Clearance: 33.8 mL/min (by C-G formula based on SCr of 0.78 mg/dL).   Assessment:  Anticoag: Warfarin for hx afib - INR 2.28, Hgb 8.9, plts WNL, no bleeding noted (PTA 1mg  daily) with low  admit INR 1.37  Goal of Therapy:  INR 2-3 Monitor platelets by anticoagulation protocol: Yes   Plan:  Warfarin 2mg  q 1800 Daily INR  [name] [name], PharmD, BCPS Clinical Staff Pharmacist Pager [phone]  [name] 02/10/2018,10:27 AM

## 2018-02-11 LAB — PROTIME-INR
INR: 2.62
Prothrombin Time: 27.8 seconds — ABNORMAL HIGH (ref 11.4–15.2)

## 2018-02-11 MED ORDER — AMOXICILLIN-POT CLAVULANATE 875-125 MG PO TABS: 1.0000 | ORAL_TABLET | Freq: Two times a day (BID) | ORAL | 0 refills | Status: AC

## 2018-02-11 MED ORDER — ENSURE ENLIVE PO LIQD: 237.0000 mL | Freq: Three times a day (TID) | ORAL | 2 refills | Status: AC

## 2018-02-11 MED ORDER — SENNOSIDES-DOCUSATE SODIUM 8.6-50 MG PO TABS: 2.0000 | ORAL_TABLET | Freq: Every day | ORAL | 2 refills | Status: AC

## 2018-02-11 MED ORDER — METOPROLOL TARTRATE 25 MG PO TABS: 12.5000 mg | ORAL_TABLET | Freq: Two times a day (BID) | ORAL | 1 refills | Status: AC

## 2018-02-11 MED ORDER — MIRTAZAPINE 7.5 MG PO TABS: 7.5000 mg | ORAL_TABLET | Freq: Every evening | ORAL | 2 refills | Status: AC

## 2018-02-11 MED ORDER — OXYCODONE-ACETAMINOPHEN 5-325 MG PO TABS: 1.0000 | ORAL_TABLET | Freq: Three times a day (TID) | ORAL | 0 refills | Status: AC | PRN

## 2018-02-11 MED ORDER — LEVOTHYROXINE SODIUM 75 MCG PO TABS: 75.0000 ug | ORAL_TABLET | Freq: Every day | ORAL | 2 refills | Status: AC

## 2018-02-11 MED ORDER — BUSPIRONE HCL 7.5 MG PO TABS: 7.5000 mg | ORAL_TABLET | Freq: Two times a day (BID) | ORAL | 2 refills | Status: AC

## 2018-02-11 MED ORDER — FUROSEMIDE 20 MG PO TABS: 20.0000 mg | ORAL_TABLET | Freq: Every day | ORAL | 0 refills | Status: AC | PRN

## 2018-02-11 MED ORDER — FLUOXETINE HCL 20 MG PO CAPS: 20.0000 mg | ORAL_CAPSULE | Freq: Every day | ORAL | 2 refills | Status: AC

## 2018-02-11 NOTE — Progress Notes (Signed)
  Speech Language Pathology Treatment: Dysphagia  Patient Details Name: [name] MRN: [id number] DOB: 11-22-1926 Today's Date: 02/11/2018 Time: 2703-5009 SLP Time Calculation (min) (ACUTE ONLY): 13 min  Assessment / Plan / Recommendation Clinical Impression  Pt was seen for f/u after MBS with goal of trying provale cup to regulate volume with thinner liquids. Pt was agreeable to minimal amounts of water. She self-feeds with specialty cup, but needs Mod cueing to bring the cup down in between sips. Otherwise, she keeps her head in extension, with immediate coughing concerning for spillage into the pharynx/larynx due to positioning. When she brings her head neutral in a more timely manner there is no coughing observed, but this only happens on 50% of trials. Pt will need additional practice using provale cup with SLP prior to making diet advancement. Would continue Dys 2 diet, nectar thick liquids for now.   HPI HPI: Pt is an 84 y.o. female with PMH of hypothyroidism, anxiety, agitation, chronic systolic CHF, atrial fibrillation on warfarin, and COPD with chronic hypoxic respiratory failure, and nonhealing right leg wound now presenting to the emergency department for evaluation of severe leg pains and nonhealing wound.  Patient underwent ORIF of the right ankle in 2012 and says subsequently been complicated by poor wound healing.  Patient was previously managed in the wound care clinic and has been treated with multiple doses of oral antibiotics.  She is not currently on antibiotics.  Family reports that she complains of severe pain at the site, which they described as worsening with exposure of underlying surgical hardware and bone. Pt has had significant weight loss over past year, diet reportedly primarily limited to tomato soup, ice cream, and ensure with coffee. Palliative care has been consulted. CXR 6/19- Pulmonary vascular congestion. Pleural effusions bilaterally. Areas of patchy atelectasis  bilaterally but no appreciable edema or frank consolidation. Pt has had recurrent PNAs and followed by ST in the past. Most recent MBS 07/05/17- primary concern is stasis in cervical esophagus of solids and liquids along with flash penetration; recommended dysphagia 2/ thin liquids, meds crushed. On this admission bedside swallow eval ordered due to coughing on liquids.       SLP Plan  Continue with current plan of care       Recommendations  Diet recommendations: Dysphagia 2 (fine chop);Nectar-thick liquid Liquids provided via: Cup;No straw Medication Administration: Crushed with puree Supervision: Staff to assist with self feeding;Full supervision/cueing for compensatory strategies Compensations: Slow rate;Small sips/bites;Follow solids with liquid Postural Changes and/or Swallow Maneuvers: Seated upright 90 degrees;Upright 30-60 min after meal                Oral Care Recommendations: Oral care BID Follow up Recommendations: Skilled Nursing facility SLP Visit Diagnosis: Dysphagia, pharyngoesophageal phase (R13.14) Plan: Continue with current plan of care       GO                Germain Osgood 02/11/2018, 2:51 PM  Germain Osgood, M.A. CCC-SLP [phone]

## 2018-02-11 NOTE — Care Management Note (Addendum)
Case Management Note  Patient Details  Name: [name] MRN: [id number] Date of Birth: 1927-07-15  Subjective/Objective:   Presents with  severe leg pains and nonhealing wound / Wound infection complicating hardware. Hx ofhypothyroidism, anxiety, agitation, chronic systolic CHF, atrial fibrillation on warfarin, and COPD with chronic hypoxic respiratory failure, and nonhealing right leg wound. Home oxygen. From home with family.   02/11/2018  1548 - NCM called and arranged pt's transportation to home with PT[facility]. PTAR informed NCM it will be a couple hrs before pt pick up, nurse and granddaughter @ made aware.      Elvera Lennox (Daughter) Dyanne Carrel ([place])    [phone]      PCP: Cooper Render  Action/Plan: Transition to home with home health service (RN,ST,NA) to follow. Pt to d/c once low air loss mattress has been delivered to home today.   Pt will need [facility] services  arranged for transportation to home.  Expected Discharge Date:  02/11/18               Expected Discharge Plan:  [employer]  In-House Referral:  NA  Discharge planning Services  CM Consult  Post Acute Care Choice:  Home Health Choice offered to:  Adult Children  DME Arranged:  (low air loss mattress) DME Agency:  [place] Arranged:  RN,ST, Nurse Aide [facility] Agency:  [facility]  Status of Service:  Completed, signed off  If discussed at [place] of Stay Meetings, dates discussed:    Additional Comments:  Sharin Mons, RN 02/11/2018, 1:26 PM

## 2018-02-11 NOTE — Discharge Summary (Signed)
[name], is a 84 y.o. female  DOB 1927-01-13  MRN [id number].  Admission date:  02/02/2018  Admitting Physician  Vianne Bulls, MD  Discharge Date:  02/11/2018   Primary MD  Clovia Cuff, MD  Recommendations for primary care physician for things to follow:   1)Air mattress as advised 2) avoid excessive Lasix/diuretic use due to risk of dehydration 3) taper off and discontinue Prozac/paroxetine due to concerns about possible appetite suppression and anticholinergic effects 4) complete antibiotics as prescribed for possible aspiration pneumonia 5) dietary modifications including nectar thickened liquids and dysphagia 2 diet as advised to reduce risk of aspiration 6) may also consider pleasure feeding (without further modifying diet)  as desired as long as you are willing to accept the risk of aspiration 7) home wound care as advised 8)Repeat INR/PT within the next 5 days, Dr Daphene Jaeger  to review results of PT/INR and adjust Coumadin dosage   Admission Diagnosis  Permanent atrial fibrillation (HCC) [I48.2] Chronic anemia [D64.9] Fixation hardware in leg [Z96.7] Anticoagulated on Coumadin [Z79.01]   Discharge Diagnosis  Permanent atrial fibrillation ([place]) [I48.2] Chronic anemia [D64.9] Fixation hardware in leg [Z96.7] Anticoagulated on Coumadin [Z79.01]    Principal Problem:   Wound infection complicating hardware ([place]) Active Problems:   Atrial fibrillation (HCC)   Hypothyroidism   COPD (chronic obstructive pulmonary disease) (HCC)   Chronic systolic CHF (congestive heart failure) (HCC)   Chronic respiratory failure with hypoxia (HCC)   Macrocytic anemia   Anxiety   Agitation   Anticoagulated on Coumadin      Past Medical History:  Diagnosis Date  . Aortic atherosclerosis ([place]) 07/07/2017  . Atrial fibrillation (HCC)    chronic Coumadin.   . CHF (congestive heart failure) ([place])     . COPD (chronic obstructive pulmonary disease) ([place])   . Glaucoma   . Hypothyroidism   . Neuropathy    PERIPHERAL  . Osteoarthritis   . Osteoporosis   . Sacral ulcer ([place]) 01/2018  . Trimalleolar fracture    LEFT TRIMALLEOLAR FRACTURE, DISLOCATION WITH OBLIQUE FRACTURE OF THE DISTAL FIBULAR DIAPHYSIS, AND NOTED BEING MILDLY COMMINUTED  . Wound infection 01/2018   right lower leg    Past Surgical History:  Procedure Laterality Date  . COLONOSCOPY W/ POLYPECTOMY  11/2010   Dr Fuller Plan.  multiple polyps:cecal, transverse, sigmoid.  largest 43m, path: tubular adenomas without HGD.  mild sigmoid diverticulosis.   .Marland KitchenORIF TIBIA & FIBULA FRACTURES  2016  . VAGINAL HYSTERECTOMY         HPI  from the history and physical done on the day of admission:    Chief Complaint: Right leg wound with severe pain   HPI: Adrienne ALTICEis a 84y.o. female with medical history significant for hypothyroidism, anxiety, agitation, chronic systolic CHF, atrial fibrillation on warfarin, and COPD with chronic hypoxic respiratory failure, and nonhealing right leg wound now presenting to the emergency department for evaluation of severe leg pains and nonhealing wound.  Patient underwent ORIF of the  right ankle in 2012 and says subsequently been complicated by poor wound healing.  Patient was previously managed in the wound care clinic and has been treated with multiple doses of oral antibiotics.  She is not currently on antibiotics.  Family reports that she complains of severe pain at the site, which they described as worsening with exposure of underlying surgical hardware and bone.   ED Course: Upon arrival to the ED, patient is found to be afebrile, saturating adequately on her usual 3 Lpm, hypotensive, and bradycardic. EKG features atrial fibrillation and CXR is notable for vascular congestion and pleural effusions. Chemistry panel in unremarkable and CBC is notable for stable Hgb at 8.8 and newly elevated MCV.  Lactic acid slightly elevated to 2.01. INR is subtherapeutic at 1.73. Troponin undetectable and BNP elevated to 1003. UA unremarkable. CRP and ESR slightly elevated.  Cultures were collected, 1 mg IV Haldol was given for agitation, and orthopedic surgery was consulted by the ED physician.  Orthopedic surgery evaluated patient in the emergency department, discussed treatment with patient's family, and medical admission for conservative treatment with IV antibiotics was recommended.          Hospital Course:     Brief Narrative:  [name] a 84 y.o.femalewith medical history significant forhypothyroidism, anxiety, agitation, chronic systolic CHF, atrial fibrillation on warfarin, and COPD with chronic hypoxic respiratory failure, and nonhealing right leg wound now presenting to the emergency department for evaluation of severe leg pains and nonhealing wound. Significant decline since admission, lethargic, aspirating. Patient's granddaughter who is her caregiver currently  wants all active treatment would like to take patient home   Plan:- Chronic nonhealing right leg wound with exposed hardware: Dr. Doran Durand was consulted recommended conservative management with IV antibiotics at this time. Patient is in constant pain and required pain medications. Palliative care consulted for goals of care, family wanted to continue with aggressive care at this time.  Blood cultures ordered and  negative so far.  Currently the wound area does not show any signs of cellulitis and no purulent discharge.  Discontinued IV antibiotics, and she remains stable.  No fever or leukocytosis. Not on any antibiotics for this particular indication. Discharge home with  Home health with air mattress when it is available. Palliative care following, they recommend palliative care at discharge and transition to hospice in the outpatient setting , family is not ready for this yet   Dysphagia Coughing on drinking  liquids, SLP did MBS -Suspect some aspiration component , added Augmentin for aspiration pneumonitis on 02/08/18. Daughter does not want tube  Feeding  per palliative care, Diet recommendations: Dysphagia 1 (puree);Thin liquid initially, patient continued to aspirate, modified barium swallow today  COPD No wheezing heard, resume albuterol inhaler as needed.  Overall stable,   Mild Acute on Chronic systolic heart failure:-  overall much improved with IV diuresis with Lasix, continue supplemental oxygen.  Soft blood pressures appear to be impeding ability to aggressively diurese    Chronic respiratory failure with hypoxia probably secondary to COPD and CHF exacerbation.- At baseline she uses on 3 L of nasal cannula oxygen.   Hypothyroidism: abnormal TSH, . -Continue levothyroxine, repeat TSH in 3 to 4 weeks .  Chronic atrial fibrillation- Controlled.  On Coumadin for anticoagulation.   Anemia of chronic disease: Hemoglobin stable at 9.    Acute metabolic encephalopathy probably from worsening sob.  Ct head without contrast ordered to rule out stroke was negative .  UA negative 6/19 CXR  no discrete PNA  Be judicious with sedative and mood altering medications Patient is more awake and alert today   Code Status: Full code Family Communication: discussed with daughter and grand daughter at bedside.  Disposition Plan: home with [facility]  services  Consultants:   Orthopedics Dr. Doran Durand  Palliative care consult   Infectious Disease: Dr Linus Salmons  Discharge Condition: stable  Diet and Activity recommendation:  As advised  Discharge Instructions     Discharge Instructions    Call MD for:  difficulty breathing, headache or visual disturbances   Complete by:  As directed    Call MD for:  persistant dizziness or light-headedness   Complete by:  As directed    Call MD for:  persistant nausea and vomiting   Complete by:  As directed    Call MD for:  redness, tenderness, or  signs of infection (pain, swelling, redness, odor or green/yellow discharge around incision site)   Complete by:  As directed    Call MD for:  severe uncontrolled pain   Complete by:  As directed    Call MD for:  temperature >100.4   Complete by:  As directed    Diet - low sodium heart healthy   Complete by:  As directed    1) dietary modifications including nectar thickened liquids and dysphagia 2 diet as advised to reduce risk of aspiration 2)may also consider pleasure feeding (without further modifying diet)  as desired as long as you are willing to accept the risk of aspiration   Discharge instructions   Complete by:  As directed    1)Air mattress as advised 2) avoid excessive Lasix/diuretic use due to risk of dehydration 3) taper off and discontinue Prozac/paroxetine due to concerns about possible appetite suppression and anticholinergic effects 4) complete antibiotics as prescribed for possible aspiration pneumonia 5) dietary modifications including nectar thickened liquids and dysphagia 2 diet as advised to reduce risk of aspiration 6) may also consider pleasure feeding (without further modifying diet)  as desired as long as you are willing to accept the risk of aspiration 7) home wound care as advised 8)Repeat INR/PT within the next 5 days, Dr Daphene Jaeger  to review results of PT/INR and adjust Coumadin dosage   Increase activity slowly   Complete by:  As directed         Discharge Medications     Allergies as of 02/11/2018   No Known Allergies     Medication List    TAKE these medications   acetaminophen 500 MG tablet Commonly known as:  TYLENOL Take 1,000 mg by mouth as needed for mild pain or headache.   albuterol (2.5 MG/3ML) 0.083% nebulizer solution Commonly known as:  PROVENTIL Take 2.5 mg every 6 (six) hours as needed by nebulization for wheezing or shortness of breath.   amoxicillin-clavulanate 875-125 MG tablet Commonly known as:  AUGMENTIN Take 1 tablet by  mouth every 12 (twelve) hours for 5 days.   busPIRone 7.5 MG tablet Commonly known as:  BUSPAR Take 1 tablet (7.5 mg total) by mouth 2 (two) times daily.   collagenase ointment Commonly known as:  SANTYL Apply topically daily. Apply Santyl to right outer ankle and right heel wounds Q day, then cover with foam dressing.  (Change foam dressing Q 3 days or PRN soiling.)   dorzolamide 2 % ophthalmic solution Commonly known as:  TRUSOPT Place 1 drop 2 (two) times daily into the left eye.   ergocalciferol 50000 units capsule Commonly known  as:  VITAMIN D2 Take 50,000 Units by mouth every Thursday.   feeding supplement (ENSURE ENLIVE) Liqd Take 237 mLs by mouth 3 (three) times daily between meals.   FLUoxetine 20 MG capsule Commonly known as:  PROZAC Take 1 capsule (20 mg total) by mouth daily. What changed:    medication strength  how much to take   furosemide 20 MG tablet Commonly known as:  LASIX Take 1 tablet (20 mg total) by mouth daily as needed for fluid. Do not take everyday (only as needed) What changed:    when to take this  additional instructions   gabapentin 600 MG tablet Commonly known as:  NEURONTIN Take 600 mg by mouth 2 (two) times daily.   guaiFENesin 600 MG 12 hr tablet Commonly known as:  MUCINEX Take 600 mg by mouth 2 (two) times daily as needed for cough or to loosen phlegm.   levothyroxine 75 MCG tablet Commonly known as:  SYNTHROID, LEVOTHROID Take 1 tablet (75 mcg total) by mouth daily before breakfast.   loperamide 2 MG capsule Commonly known as:  IMODIUM Take 1 capsule (2 mg total) by mouth daily as needed for diarrhea or loose stools.   loratadine 10 MG tablet Commonly known as:  CLARITIN Take 10 mg by mouth daily as needed for allergies.   methocarbamol 500 MG tablet Commonly known as:  ROBAXIN Take 500 mg 2 (two) times daily by mouth.   metoprolol tartrate 25 MG tablet Commonly known as:  LOPRESSOR Take 0.5 tablets (12.5 mg  total) by mouth 2 (two) times daily.   mirtazapine 7.5 MG tablet Commonly known as:  REMERON Take 1 tablet (7.5 mg total) by mouth every evening.   nystatin cream Commonly known as:  MYCOSTATIN Apply 1 application topically as needed for dry skin (rash).   omeprazole 40 MG capsule Commonly known as:  PRILOSEC Take 40 mg by mouth daily.   oxyCODONE-acetaminophen 5-325 MG tablet Commonly known as:  PERCOCET Take 1 tablet by mouth 3 (three) times daily as needed for moderate pain or severe pain.   OXYGEN Inhale 3 L daily into the lungs. Continuous   pilocarpine 4 % ophthalmic solution Commonly known as:  PILOCAR Place 1 drop 2 (two) times daily into the left eye.   rOPINIRole 1 MG tablet Commonly known as:  REQUIP Take 1 mg by mouth every evening.   senna-docusate 8.6-50 MG tablet Commonly known as:  Senokot-S Take 2 tablets by mouth at bedtime.   timolol 0.5 % ophthalmic solution Commonly known as:  TIMOPTIC Place 1 drop into the left eye 2 (two) times daily.   travoprost (benzalkonium) 0.004 % ophthalmic solution Commonly known as:  TRAVATAN Place 1 drop into the left eye at bedtime.   warfarin 2 MG tablet Commonly known as:  COUMADIN Take 1 mg by mouth daily. Taking 1/2 tablet ('1mg'$ ) daily at 6pm   warfarin 3 MG tablet Commonly known as:  COUMADIN Take 0.5 tablets (1.5 mg total) by mouth daily at 6 PM.            Durable Medical Equipment  (From admission, onward)        Start     Ordered   02/09/18 1402  For home use only DME Air overlay mattress  Once    Comments:  Multiple bed sores   02/09/18 1401   02/08/18 1035  For home use only DME Other see comment  Once    Comments:  Gel overlay   02/08/18  1034   02/07/18 1632  For home use only DME Other see comment  Once    Comments:  Low Air loss mattress   02/07/18 1632      Major procedures and Radiology Reports - PLEASE review detailed and final reports for all details, in brief -    Dg Chest 2  View  Result Date: 02/02/2018 CLINICAL DATA:  Cough and shortness of breath EXAM: CHEST - 2 VIEW COMPARISON:  July 05, 2017 FINDINGS: There is cardiomegaly with mild pulmonary venous hypertension. There are pleural effusions bilaterally with atelectatic change in each lung base. There is also atelectatic change in right upper lobe. No evident adenopathy. There is aortic atherosclerosis. Bones osteoporotic. There are several collapsed mid lower thoracic vertebral bodies with increase in kyphosis. IMPRESSION: Pulmonary vascular congestion. Pleural effusions bilaterally. Areas of patchy atelectasis bilaterally but no appreciable edema or frank consolidation. There is aortic atherosclerosis. Bones osteoporotic with multiple compression fractures. Aortic Atherosclerosis (ICD10-I70.0). Electronically Signed   By: Lowella Grip III M.D.   On: 02/02/2018 11:21   Dg Ankle Complete Right  Result Date: 02/02/2018 CLINICAL DATA:  Atraumatic right foot pain EXAM: RIGHT ANKLE - COMPLETE 3+ VIEW COMPARISON:  02/09/2008 fluoroscopy. FINDINGS: Marked osteopenia. Remote distal fibula fracture with plate and screw fixation. The fracture is healed. The upper for screws are proud, especially the most proximal screw, stable. No evidence of fracture. There is lucency about the tips of the upper 4 screws, not seen on fluoroscopy. Remote and healed medial malleolus fracture. Hardware has been removed since prior. Tibiotalar joint narrowing. Marked osteopenia. IMPRESSION: 1. Remote and healed fibular fracture with plate and screw fixation. The upper screws are proud, stable from 02/09/2008 fluoroscopy. There is lucency about the tips of the upper 4 screws not seen on prior fluoroscopy, please correlate for clinical signs of infection. 2. Remote and healed medial malleolus fracture. 3. Tibiotalar osteoarthritis and prominent osteopenia. Electronically Signed   By: Monte Fantasia M.D.   On: 02/02/2018 12:37   Ct Head Wo  Contrast  Result Date: 02/08/2018 CLINICAL DATA:  84 year old female with altered mental status. Initial encounter. EXAM: CT HEAD WITHOUT CONTRAST TECHNIQUE: Contiguous axial images were obtained from the base of the skull through the vertex without intravenous contrast. COMPARISON:  07/03/2017 CT. FINDINGS: Brain: No intracranial hemorrhage or CT evidence of large acute infarct. Prominent chronic microvascular changes. Moderate to marked global atrophy. No intracranial mass lesion noted on this unenhanced exam. Vascular: Vascular calcifications Skull: Negative Sinuses/Orbits: No acute orbital abnormality. Visualized paranasal sinuses are clear. Other: Complete opacification mastoid air cells and partial opacification middle ear cavities new from prior CT. No obstructing lesion of the eustachian tube is noted. No obvious intracranial extension. IMPRESSION: No intracranial hemorrhage or CT evidence of large acute infarct. Prominent chronic microvascular changes. Moderate to marked global atrophy. Complete opacification mastoid air cells and partial opacification middle ear cavities bilaterally is new from prior CT. No obstructing lesion of the eustachian tube is noted. Electronically Signed   By: Genia Del M.D.   On: 02/08/2018 13:51   Dg Chest Port 1 View  Result Date: 02/08/2018 CLINICAL DATA:  Shortness of breath. The patient is not able to give additional history. History of COPD, CHF, atrial fibrillation, former smoker. EXAM: PORTABLE CHEST 1 VIEW COMPARISON:  PA and lateral chest x-ray of February 02, 2018 FINDINGS: The lungs are reasonably well inflated. There are moderate-sized bilateral pleural effusions. The cardiac silhouette is enlarged. The pulmonary vascularity is indistinct.  The interstitial markings are coarse. There is calcification in the wall of the aortic arch. IMPRESSION: CHF with mild interstitial edema and small to moderate-sized bilateral pleural effusions. No discrete pneumonia.  Thoracic aortic atherosclerosis. Electronically Signed   By: David  Martinique M.D.   On: 02/08/2018 12:38   Dg Foot Complete Right  Result Date: 02/02/2018 CLINICAL DATA:  RIGHT entire foot pain, no fall or known injury. History of RIGHT ankle fracture. EXAM: RIGHT FOOT COMPLETE - 3+ VIEW COMPARISON:  None. FINDINGS: Severe osteopenia throughout the RIGHT foot limits characterization of osseous detail, however, there are questionable slightly displaced fractures at the distal margins of the first and second proximal phalanx. Remainder of the osseous structures of the RIGHT foot appear intact and normally aligned. Fixation hardware partially imaged at the distal fibula. IMPRESSION: 1. Questionable slightly displaced fractures at the distal aspects of the first and second proximal phalanx, not convincing, seen on only one view. Consider CT for confirmation. 2. Severe osteopenia. Electronically Signed   By: Franki Cabot M.D.   On: 02/02/2018 10:38   Dg Swallowing Func-speech Pathology  Result Date: 02/10/2018 Objective Swallowing Evaluation: Type of Study: MBS-Modified Barium Swallow Study  Patient Details Name: [name] MRN: [id number] Date of Birth: Jan 06, 1927 Today's Date: 02/10/2018 Time: SLP Start Time (ACUTE ONLY): 0835 -SLP Stop Time (ACUTE ONLY): 0900 SLP Time Calculation (min) (ACUTE ONLY): 25 min Past Medical History: Past Medical History: Diagnosis Date . Aortic atherosclerosis ([place]) 07/07/2017 . Atrial fibrillation (HCC)   chronic Coumadin.  . CHF (congestive heart failure) ([place])  . COPD (chronic obstructive pulmonary disease) ([place])  . Glaucoma  . Hypothyroidism  . Neuropathy   PERIPHERAL . Osteoarthritis  . Osteoporosis  . Sacral ulcer ([place]) 01/2018 . Trimalleolar fracture   LEFT TRIMALLEOLAR FRACTURE, DISLOCATION WITH OBLIQUE FRACTURE OF THE DISTAL FIBULAR DIAPHYSIS, AND NOTED BEING MILDLY COMMINUTED . Wound infection 01/2018  right lower leg Past Surgical History: Past Surgical History:  Procedure Laterality Date . COLONOSCOPY W/ POLYPECTOMY  11/2010  Dr Fuller Plan.  multiple polyps:cecal, transverse, sigmoid.  largest 72m, path: tubular adenomas without HGD.  mild sigmoid diverticulosis.  .Marland KitchenORIF TIBIA & FIBULA FRACTURES  2016 . VAGINAL HYSTERECTOMY   HPI: Pt is an 84y.o. female with PMH of hypothyroidism, anxiety, agitation, chronic systolic CHF, atrial fibrillation on warfarin, and COPD with chronic hypoxic respiratory failure, and nonhealing right leg wound now presenting to the emergency department for evaluation of severe leg pains and nonhealing wound.  Patient underwent ORIF of the right ankle in 2012 and says subsequently been complicated by poor wound healing.  Patient was previously managed in the wound care clinic and has been treated with multiple doses of oral antibiotics.  She is not currently on antibiotics.  Family reports that she complains of severe pain at the site, which they described as worsening with exposure of underlying surgical hardware and bone. Pt has had significant weight loss over past year, diet reportedly primarily limited to tomato soup, ice cream, and ensure with coffee. Palliative care has been consulted. CXR 6/19- Pulmonary vascular congestion. Pleural effusions bilaterally. Areas of patchy atelectasis bilaterally but no appreciable edema or frank consolidation. Pt has had recurrent PNAs and followed by ST in the past. Most recent MBS 07/05/17- primary concern is stasis in cervical esophagus of solids and liquids along with flash penetration; recommended dysphagia 2/ thin liquids, meds crushed. On this admission bedside swallow eval ordered due to coughing on liquids.  No data recorded Assessment /  Plan / Recommendation CHL IP CLINICAL IMPRESSIONS 02/10/2018 Clinical Impression Patient presents with a mild-moderate, primarily pharyngeal phase dysphagia, characterized by delayed swallow initiation with thin liquids reaching the level of the pyriform sinuses prior to  initiating a swallow. Result is deep flash penetration and eventual silent aspiration. With both nectar thick liquid trials and thin liquid via tsp, patient able to consistently protect her airway. Pharyngeal strength intact. Oral phase functional but prolonged with soft solid bolus. Will advance solids and downgrade liquids for now for decreased aspiration risk. Will consider trial of thin liquids via [place] cup to allow 5cc for hopeful advancement back to thin liquids. Combination of cognitive and hearing status may prohibit correct use. Will f/u.  SLP Visit Diagnosis Dysphagia, pharyngoesophageal phase (R13.14) Attention and concentration deficit following -- Frontal lobe and executive function deficit following -- Impact on safety and function Moderate aspiration risk   CHL IP TREATMENT RECOMMENDATION 02/10/2018 Treatment Recommendations Therapy as outlined in treatment plan below   Prognosis 02/10/2018 Prognosis for Safe Diet Advancement Fair Barriers to Reach Goals Other (Comment) Barriers/Prognosis Comment -- CHL IP DIET RECOMMENDATION 02/10/2018 SLP Diet Recommendations Dysphagia 2 (Fine chop) solids;Nectar thick liquid Liquid Administration via Cup;No straw Medication Administration Crushed with puree Compensations Slow rate;Small sips/bites;Follow solids with liquid Postural Changes Seated upright at 90 degrees   CHL IP OTHER RECOMMENDATIONS 02/10/2018 Recommended Consults -- Oral Care Recommendations Oral care BID Other Recommendations Order thickener from pharmacy;Prohibited food (jello, ice cream, thin soups);Remove water pitcher   CHL IP FOLLOW UP RECOMMENDATIONS 02/10/2018 Follow up Recommendations Skilled Nursing facility   [facility] IP FREQUENCY AND DURATION 02/10/2018 Speech Therapy Frequency (ACUTE ONLY) min 2x/week Treatment Duration 2 weeks      CHL IP ORAL PHASE 02/10/2018 Oral Phase WFL Oral - Pudding Teaspoon -- Oral - Pudding Cup -- Oral - Honey Teaspoon -- Oral - Honey Cup -- Oral - Nectar Teaspoon --  Oral - Nectar Cup -- Oral - Nectar Straw -- Oral - Thin Teaspoon -- Oral - Thin Cup -- Oral - Thin Straw -- Oral - Puree -- Oral - Mech Soft -- Oral - Regular -- Oral - Multi-Consistency -- Oral - Pill -- Oral Phase - Comment --  CHL IP PHARYNGEAL PHASE 02/10/2018 Pharyngeal Phase Impaired Pharyngeal- Pudding Teaspoon -- Pharyngeal -- Pharyngeal- Pudding Cup -- Pharyngeal -- Pharyngeal- Honey Teaspoon -- Pharyngeal -- Pharyngeal- Honey Cup -- Pharyngeal -- Pharyngeal- Nectar Teaspoon Delayed swallow initiation-vallecula Pharyngeal -- Pharyngeal- Nectar Cup Delayed swallow initiation-vallecula Pharyngeal -- Pharyngeal- Nectar Straw -- Pharyngeal -- Pharyngeal- Thin Teaspoon Delayed swallow initiation-vallecula Pharyngeal -- Pharyngeal- Thin Cup Delayed swallow initiation-pyriform sinuses;Penetration/Aspiration during swallow;Penetration/Aspiration before swallow Pharyngeal Material enters airway, passes BELOW cords without attempt by patient to eject out (silent aspiration) Pharyngeal- Thin Straw -- Pharyngeal -- Pharyngeal- Puree -- Pharyngeal -- Pharyngeal- Mechanical Soft -- Pharyngeal -- Pharyngeal- Regular -- Pharyngeal -- Pharyngeal- Multi-consistency -- Pharyngeal -- Pharyngeal- Pill -- Pharyngeal -- Pharyngeal Comment --  CHL IP CERVICAL ESOPHAGEAL PHASE 02/10/2018 Cervical Esophageal Phase WFL Pudding Teaspoon -- Pudding Cup -- Honey Teaspoon -- Honey Cup -- Nectar Teaspoon -- Nectar Cup -- Nectar Straw -- Thin Teaspoon -- Thin Cup -- Thin Straw -- Puree -- Mechanical Soft -- Regular -- Multi-consistency -- Pill -- Cervical Esophageal Comment -- Gabriel Rainwater MA, CCC-SLP [phone] McCoy Leah Meryl 02/10/2018, 9:28 AM               Micro Results    Recent Results (from the past 240 hour(s))  Blood culture (  routine x 2)     Status: None   Collection Time: 02/02/18 11:53 AM  Result Value Ref Range Status   Specimen Description BLOOD RIGHT WRIST  Final   Special Requests   Final    BOTTLES DRAWN  AEROBIC AND ANAEROBIC Blood Culture results may not be optimal due to an inadequate volume of blood received in culture bottles   Culture   Final    NO GROWTH 5 DAYS Performed at [facility], [place] [address] [place], [place] [address]    Report Status 02/07/2018 FINAL  Final  Blood culture (routine x 2)     Status: None   Collection Time: 02/02/18  1:30 PM  Result Value Ref Range Status   Specimen Description BLOOD LEFT ANTECUBITAL  Final   Special Requests   Final    BOTTLES DRAWN AEROBIC AND ANAEROBIC Blood Culture adequate volume   Culture   Final    NO GROWTH 5 DAYS Performed at [facility], [place] [address], [place], [place] [address]    Report Status 02/07/2018 FINAL  Final  MRSA PCR Screening     Status: Abnormal   Collection Time: 02/02/18 10:30 PM  Result Value Ref Range Status   MRSA by PCR POSITIVE (A) NEGATIVE Final    Comment:        The GeneXpert MRSA Assay (FDA approved for NASAL specimens only), is one component of a comprehensive MRSA colonization surveillance program. It is not intended to diagnose MRSA infection nor to guide or monitor treatment for MRSA infections. RESULT CALLED TO, READ BACK BY AND VERIFIED WITH: Rosalie Doctor RN 02/03/18 0248 JDW Performed at [facility] [address] [place], [place] [address]        Today   Subjective    Adley Castello today has no new concerns          Patient has been seen and examined prior to discharge   Objective   Blood pressure 104/61, pulse 80, temperature 97.6 F (36.4 C), temperature source Oral, resp. rate 17, height '5\' 7"'$  (1.702 m), weight 44.9 kg (99 lb), SpO2 100 %.   Intake/Output Summary (Last 24 hours) at 02/11/2018 1056 Last data filed at 02/11/2018 1000 Gross per 24 hour  Intake 442 ml  Output 800 ml  Net -358 ml    Exam Gen:- Awake , not really verbal HEENT:- [place].AT, No sclera icterus Neck-Supple Neck,No JVD,.  Lungs-  CTAB , fair air movement CV- S1, S2  normal Abd-  +ve B.Sounds, Abd Soft, No tenderness,    Extremity/Skin:- right non healing wound with exposed hardware on the right leg.  With right heel pressure ulcer.  No signs of cellulitis.  Skin: sacral pressure ulcer stage 1 Psych-affect is flat, cognitive deficits noted neuro-generalized weakness without new focal deficit   Data Review   CBC w Diff:  Lab Results  Component Value Date   WBC 12.2 (H) 02/08/2018   HGB 8.9 (L) 02/08/2018   HCT 28.3 (L) 02/08/2018   HCT 28.8 (L) 02/03/2018   PLT 299 02/08/2018   LYMPHOPCT 11 02/03/2018   MONOPCT 5 02/03/2018   EOSPCT 3 02/03/2018   BASOPCT 1 02/03/2018    CMP:  Lab Results  Component Value Date   NA 135 02/08/2018   K 4.6 02/08/2018   CL 98 02/08/2018   CO2 31 02/08/2018   BUN 14 02/08/2018   CREATININE 0.78 02/08/2018   PROT 6.9 07/03/2017   ALBUMIN  2.5 (L) 07/04/2017   BILITOT 0.4 07/03/2017   ALKPHOS 145 (H) 07/03/2017   AST 85 (H) 07/03/2017   ALT 32 07/03/2017  .   Total Discharge time is about 33 minutes  Roxan Hockey M.D on 02/11/2018 at 10:56 AM  [facility]   Office  [phone]  Voice Recognition Viviann Spare dictation system was used to create this note, attempts have been made to correct errors. Please contact the author with questions and/or clarifications.

## 2018-02-12 DIAGNOSIS — L89510 Pressure ulcer of right ankle, unstageable: Secondary | ICD-10-CM | POA: Diagnosis not present

## 2018-02-12 DIAGNOSIS — I110 Hypertensive heart disease with heart failure: Secondary | ICD-10-CM | POA: Diagnosis not present

## 2018-02-12 DIAGNOSIS — G894 Chronic pain syndrome: Secondary | ICD-10-CM | POA: Diagnosis not present

## 2018-02-12 DIAGNOSIS — I5042 Chronic combined systolic (congestive) and diastolic (congestive) heart failure: Secondary | ICD-10-CM | POA: Diagnosis not present

## 2018-02-12 DIAGNOSIS — L89152 Pressure ulcer of sacral region, stage 2: Secondary | ICD-10-CM | POA: Diagnosis not present

## 2018-02-12 DIAGNOSIS — J441 Chronic obstructive pulmonary disease with (acute) exacerbation: Secondary | ICD-10-CM | POA: Diagnosis not present

## 2018-02-12 DIAGNOSIS — J449 Chronic obstructive pulmonary disease, unspecified: Secondary | ICD-10-CM | POA: Diagnosis not present

## 2018-02-12 DIAGNOSIS — M6281 Muscle weakness (generalized): Secondary | ICD-10-CM | POA: Diagnosis not present

## 2018-02-12 DIAGNOSIS — I509 Heart failure, unspecified: Secondary | ICD-10-CM | POA: Diagnosis not present

## 2018-02-12 DIAGNOSIS — L89613 Pressure ulcer of right heel, stage 3: Secondary | ICD-10-CM | POA: Diagnosis not present

## 2018-02-15 DIAGNOSIS — I5042 Chronic combined systolic (congestive) and diastolic (congestive) heart failure: Secondary | ICD-10-CM | POA: Diagnosis not present

## 2018-02-15 DIAGNOSIS — L89613 Pressure ulcer of right heel, stage 3: Secondary | ICD-10-CM | POA: Diagnosis not present

## 2018-02-15 DIAGNOSIS — L89510 Pressure ulcer of right ankle, unstageable: Secondary | ICD-10-CM | POA: Diagnosis not present

## 2018-02-15 DIAGNOSIS — J441 Chronic obstructive pulmonary disease with (acute) exacerbation: Secondary | ICD-10-CM | POA: Diagnosis not present

## 2018-02-15 DIAGNOSIS — I110 Hypertensive heart disease with heart failure: Secondary | ICD-10-CM | POA: Diagnosis not present

## 2018-02-15 DIAGNOSIS — L89152 Pressure ulcer of sacral region, stage 2: Secondary | ICD-10-CM | POA: Diagnosis not present

## 2018-02-16 DIAGNOSIS — I5042 Chronic combined systolic (congestive) and diastolic (congestive) heart failure: Secondary | ICD-10-CM | POA: Diagnosis not present

## 2018-02-16 DIAGNOSIS — J441 Chronic obstructive pulmonary disease with (acute) exacerbation: Secondary | ICD-10-CM | POA: Diagnosis not present

## 2018-02-16 DIAGNOSIS — L89152 Pressure ulcer of sacral region, stage 2: Secondary | ICD-10-CM | POA: Diagnosis not present

## 2018-02-16 DIAGNOSIS — L89510 Pressure ulcer of right ankle, unstageable: Secondary | ICD-10-CM | POA: Diagnosis not present

## 2018-02-16 DIAGNOSIS — L89613 Pressure ulcer of right heel, stage 3: Secondary | ICD-10-CM | POA: Diagnosis not present

## 2018-02-16 DIAGNOSIS — I110 Hypertensive heart disease with heart failure: Secondary | ICD-10-CM | POA: Diagnosis not present

## 2018-02-18 DIAGNOSIS — L89152 Pressure ulcer of sacral region, stage 2: Secondary | ICD-10-CM | POA: Diagnosis not present

## 2018-02-18 DIAGNOSIS — L89510 Pressure ulcer of right ankle, unstageable: Secondary | ICD-10-CM | POA: Diagnosis not present

## 2018-02-18 DIAGNOSIS — I110 Hypertensive heart disease with heart failure: Secondary | ICD-10-CM | POA: Diagnosis not present

## 2018-02-18 DIAGNOSIS — L89613 Pressure ulcer of right heel, stage 3: Secondary | ICD-10-CM | POA: Diagnosis not present

## 2018-02-18 DIAGNOSIS — I5042 Chronic combined systolic (congestive) and diastolic (congestive) heart failure: Secondary | ICD-10-CM | POA: Diagnosis not present

## 2018-02-18 DIAGNOSIS — J441 Chronic obstructive pulmonary disease with (acute) exacerbation: Secondary | ICD-10-CM | POA: Diagnosis not present

## 2018-02-21 DIAGNOSIS — I110 Hypertensive heart disease with heart failure: Secondary | ICD-10-CM | POA: Diagnosis not present

## 2018-02-21 DIAGNOSIS — L89152 Pressure ulcer of sacral region, stage 2: Secondary | ICD-10-CM | POA: Diagnosis not present

## 2018-02-21 DIAGNOSIS — I5042 Chronic combined systolic (congestive) and diastolic (congestive) heart failure: Secondary | ICD-10-CM | POA: Diagnosis not present

## 2018-02-21 DIAGNOSIS — J441 Chronic obstructive pulmonary disease with (acute) exacerbation: Secondary | ICD-10-CM | POA: Diagnosis not present

## 2018-02-21 DIAGNOSIS — L89510 Pressure ulcer of right ankle, unstageable: Secondary | ICD-10-CM | POA: Diagnosis not present

## 2018-02-21 DIAGNOSIS — L89613 Pressure ulcer of right heel, stage 3: Secondary | ICD-10-CM | POA: Diagnosis not present

## 2018-02-23 DIAGNOSIS — I5042 Chronic combined systolic (congestive) and diastolic (congestive) heart failure: Secondary | ICD-10-CM | POA: Diagnosis not present

## 2018-02-23 DIAGNOSIS — J441 Chronic obstructive pulmonary disease with (acute) exacerbation: Secondary | ICD-10-CM | POA: Diagnosis not present

## 2018-02-23 DIAGNOSIS — I110 Hypertensive heart disease with heart failure: Secondary | ICD-10-CM | POA: Diagnosis not present

## 2018-02-23 DIAGNOSIS — L89510 Pressure ulcer of right ankle, unstageable: Secondary | ICD-10-CM | POA: Diagnosis not present

## 2018-02-23 DIAGNOSIS — L89613 Pressure ulcer of right heel, stage 3: Secondary | ICD-10-CM | POA: Diagnosis not present

## 2018-02-23 DIAGNOSIS — L89152 Pressure ulcer of sacral region, stage 2: Secondary | ICD-10-CM | POA: Diagnosis not present

## 2018-02-24 DIAGNOSIS — I5042 Chronic combined systolic (congestive) and diastolic (congestive) heart failure: Secondary | ICD-10-CM | POA: Diagnosis not present

## 2018-02-24 DIAGNOSIS — L89613 Pressure ulcer of right heel, stage 3: Secondary | ICD-10-CM | POA: Diagnosis not present

## 2018-02-24 DIAGNOSIS — L89152 Pressure ulcer of sacral region, stage 2: Secondary | ICD-10-CM | POA: Diagnosis not present

## 2018-02-24 DIAGNOSIS — I110 Hypertensive heart disease with heart failure: Secondary | ICD-10-CM | POA: Diagnosis not present

## 2018-02-24 DIAGNOSIS — L89510 Pressure ulcer of right ankle, unstageable: Secondary | ICD-10-CM | POA: Diagnosis not present

## 2018-02-24 DIAGNOSIS — J441 Chronic obstructive pulmonary disease with (acute) exacerbation: Secondary | ICD-10-CM | POA: Diagnosis not present

## 2018-02-25 DIAGNOSIS — I5042 Chronic combined systolic (congestive) and diastolic (congestive) heart failure: Secondary | ICD-10-CM | POA: Diagnosis not present

## 2018-02-25 DIAGNOSIS — L89152 Pressure ulcer of sacral region, stage 2: Secondary | ICD-10-CM | POA: Diagnosis not present

## 2018-02-25 DIAGNOSIS — L89510 Pressure ulcer of right ankle, unstageable: Secondary | ICD-10-CM | POA: Diagnosis not present

## 2018-02-25 DIAGNOSIS — J441 Chronic obstructive pulmonary disease with (acute) exacerbation: Secondary | ICD-10-CM | POA: Diagnosis not present

## 2018-02-25 DIAGNOSIS — L89613 Pressure ulcer of right heel, stage 3: Secondary | ICD-10-CM | POA: Diagnosis not present

## 2018-02-25 DIAGNOSIS — I110 Hypertensive heart disease with heart failure: Secondary | ICD-10-CM | POA: Diagnosis not present

## 2018-03-01 DIAGNOSIS — I5042 Chronic combined systolic (congestive) and diastolic (congestive) heart failure: Secondary | ICD-10-CM | POA: Diagnosis not present

## 2018-03-01 DIAGNOSIS — L89152 Pressure ulcer of sacral region, stage 2: Secondary | ICD-10-CM | POA: Diagnosis not present

## 2018-03-01 DIAGNOSIS — J441 Chronic obstructive pulmonary disease with (acute) exacerbation: Secondary | ICD-10-CM | POA: Diagnosis not present

## 2018-03-01 DIAGNOSIS — I110 Hypertensive heart disease with heart failure: Secondary | ICD-10-CM | POA: Diagnosis not present

## 2018-03-01 DIAGNOSIS — L89510 Pressure ulcer of right ankle, unstageable: Secondary | ICD-10-CM | POA: Diagnosis not present

## 2018-03-01 DIAGNOSIS — L89613 Pressure ulcer of right heel, stage 3: Secondary | ICD-10-CM | POA: Diagnosis not present

## 2018-03-02 DIAGNOSIS — L89510 Pressure ulcer of right ankle, unstageable: Secondary | ICD-10-CM | POA: Diagnosis not present

## 2018-03-02 DIAGNOSIS — I5042 Chronic combined systolic (congestive) and diastolic (congestive) heart failure: Secondary | ICD-10-CM | POA: Diagnosis not present

## 2018-03-02 DIAGNOSIS — J441 Chronic obstructive pulmonary disease with (acute) exacerbation: Secondary | ICD-10-CM | POA: Diagnosis not present

## 2018-03-02 DIAGNOSIS — I110 Hypertensive heart disease with heart failure: Secondary | ICD-10-CM | POA: Diagnosis not present

## 2018-03-02 DIAGNOSIS — L89613 Pressure ulcer of right heel, stage 3: Secondary | ICD-10-CM | POA: Diagnosis not present

## 2018-03-02 DIAGNOSIS — L89152 Pressure ulcer of sacral region, stage 2: Secondary | ICD-10-CM | POA: Diagnosis not present

## 2018-03-03 DIAGNOSIS — L89152 Pressure ulcer of sacral region, stage 2: Secondary | ICD-10-CM | POA: Diagnosis not present

## 2018-03-03 DIAGNOSIS — I5042 Chronic combined systolic (congestive) and diastolic (congestive) heart failure: Secondary | ICD-10-CM | POA: Diagnosis not present

## 2018-03-03 DIAGNOSIS — J441 Chronic obstructive pulmonary disease with (acute) exacerbation: Secondary | ICD-10-CM | POA: Diagnosis not present

## 2018-03-03 DIAGNOSIS — L89613 Pressure ulcer of right heel, stage 3: Secondary | ICD-10-CM | POA: Diagnosis not present

## 2018-03-03 DIAGNOSIS — L89510 Pressure ulcer of right ankle, unstageable: Secondary | ICD-10-CM | POA: Diagnosis not present

## 2018-03-03 DIAGNOSIS — I110 Hypertensive heart disease with heart failure: Secondary | ICD-10-CM | POA: Diagnosis not present

## 2018-03-04 DIAGNOSIS — I5042 Chronic combined systolic (congestive) and diastolic (congestive) heart failure: Secondary | ICD-10-CM | POA: Diagnosis not present

## 2018-03-04 DIAGNOSIS — L89510 Pressure ulcer of right ankle, unstageable: Secondary | ICD-10-CM | POA: Diagnosis not present

## 2018-03-04 DIAGNOSIS — L89613 Pressure ulcer of right heel, stage 3: Secondary | ICD-10-CM | POA: Diagnosis not present

## 2018-03-04 DIAGNOSIS — J441 Chronic obstructive pulmonary disease with (acute) exacerbation: Secondary | ICD-10-CM | POA: Diagnosis not present

## 2018-03-04 DIAGNOSIS — L89152 Pressure ulcer of sacral region, stage 2: Secondary | ICD-10-CM | POA: Diagnosis not present

## 2018-03-04 DIAGNOSIS — I110 Hypertensive heart disease with heart failure: Secondary | ICD-10-CM | POA: Diagnosis not present

## 2018-03-07 DIAGNOSIS — I5042 Chronic combined systolic (congestive) and diastolic (congestive) heart failure: Secondary | ICD-10-CM | POA: Diagnosis not present

## 2018-03-07 DIAGNOSIS — L89510 Pressure ulcer of right ankle, unstageable: Secondary | ICD-10-CM | POA: Diagnosis not present

## 2018-03-07 DIAGNOSIS — L89613 Pressure ulcer of right heel, stage 3: Secondary | ICD-10-CM | POA: Diagnosis not present

## 2018-03-07 DIAGNOSIS — L89152 Pressure ulcer of sacral region, stage 2: Secondary | ICD-10-CM | POA: Diagnosis not present

## 2018-03-07 DIAGNOSIS — J441 Chronic obstructive pulmonary disease with (acute) exacerbation: Secondary | ICD-10-CM | POA: Diagnosis not present

## 2018-03-07 DIAGNOSIS — I110 Hypertensive heart disease with heart failure: Secondary | ICD-10-CM | POA: Diagnosis not present

## 2018-03-08 DIAGNOSIS — L89613 Pressure ulcer of right heel, stage 3: Secondary | ICD-10-CM | POA: Diagnosis not present

## 2018-03-08 DIAGNOSIS — L89510 Pressure ulcer of right ankle, unstageable: Secondary | ICD-10-CM | POA: Diagnosis not present

## 2018-03-08 DIAGNOSIS — L89152 Pressure ulcer of sacral region, stage 2: Secondary | ICD-10-CM | POA: Diagnosis not present

## 2018-03-08 DIAGNOSIS — I110 Hypertensive heart disease with heart failure: Secondary | ICD-10-CM | POA: Diagnosis not present

## 2018-03-08 DIAGNOSIS — I5042 Chronic combined systolic (congestive) and diastolic (congestive) heart failure: Secondary | ICD-10-CM | POA: Diagnosis not present

## 2018-03-08 DIAGNOSIS — J441 Chronic obstructive pulmonary disease with (acute) exacerbation: Secondary | ICD-10-CM | POA: Diagnosis not present

## 2018-03-09 DIAGNOSIS — I5042 Chronic combined systolic (congestive) and diastolic (congestive) heart failure: Secondary | ICD-10-CM | POA: Diagnosis not present

## 2018-03-09 DIAGNOSIS — L89152 Pressure ulcer of sacral region, stage 2: Secondary | ICD-10-CM | POA: Diagnosis not present

## 2018-03-09 DIAGNOSIS — T8469XD Infection and inflammatory reaction due to internal fixation device of other site, subsequent encounter: Secondary | ICD-10-CM | POA: Diagnosis not present

## 2018-03-09 DIAGNOSIS — I110 Hypertensive heart disease with heart failure: Secondary | ICD-10-CM | POA: Diagnosis not present

## 2018-03-09 DIAGNOSIS — L89613 Pressure ulcer of right heel, stage 3: Secondary | ICD-10-CM | POA: Diagnosis not present

## 2018-03-09 DIAGNOSIS — T84223D Displacement of internal fixation device of bones of foot and toes, subsequent encounter: Secondary | ICD-10-CM | POA: Diagnosis not present

## 2018-03-11 DIAGNOSIS — I4891 Unspecified atrial fibrillation: Secondary | ICD-10-CM | POA: Diagnosis not present

## 2018-03-11 DIAGNOSIS — J449 Chronic obstructive pulmonary disease, unspecified: Secondary | ICD-10-CM | POA: Diagnosis not present

## 2018-03-11 DIAGNOSIS — N390 Urinary tract infection, site not specified: Secondary | ICD-10-CM | POA: Diagnosis not present

## 2018-03-11 DIAGNOSIS — G894 Chronic pain syndrome: Secondary | ICD-10-CM | POA: Diagnosis not present

## 2018-03-15 DIAGNOSIS — T8469XD Infection and inflammatory reaction due to internal fixation device of other site, subsequent encounter: Secondary | ICD-10-CM | POA: Diagnosis not present

## 2018-03-15 DIAGNOSIS — T84223D Displacement of internal fixation device of bones of foot and toes, subsequent encounter: Secondary | ICD-10-CM | POA: Diagnosis not present

## 2018-03-15 DIAGNOSIS — L89152 Pressure ulcer of sacral region, stage 2: Secondary | ICD-10-CM | POA: Diagnosis not present

## 2018-03-15 DIAGNOSIS — L89613 Pressure ulcer of right heel, stage 3: Secondary | ICD-10-CM | POA: Diagnosis not present

## 2018-03-15 DIAGNOSIS — I5042 Chronic combined systolic (congestive) and diastolic (congestive) heart failure: Secondary | ICD-10-CM | POA: Diagnosis not present

## 2018-03-15 DIAGNOSIS — I110 Hypertensive heart disease with heart failure: Secondary | ICD-10-CM | POA: Diagnosis not present

## 2018-03-16 DIAGNOSIS — I110 Hypertensive heart disease with heart failure: Secondary | ICD-10-CM | POA: Diagnosis not present

## 2018-03-16 DIAGNOSIS — L89152 Pressure ulcer of sacral region, stage 2: Secondary | ICD-10-CM | POA: Diagnosis not present

## 2018-03-16 DIAGNOSIS — T84223D Displacement of internal fixation device of bones of foot and toes, subsequent encounter: Secondary | ICD-10-CM | POA: Diagnosis not present

## 2018-03-16 DIAGNOSIS — T8469XD Infection and inflammatory reaction due to internal fixation device of other site, subsequent encounter: Secondary | ICD-10-CM | POA: Diagnosis not present

## 2018-03-16 DIAGNOSIS — L89613 Pressure ulcer of right heel, stage 3: Secondary | ICD-10-CM | POA: Diagnosis not present

## 2018-03-16 DIAGNOSIS — I5042 Chronic combined systolic (congestive) and diastolic (congestive) heart failure: Secondary | ICD-10-CM | POA: Diagnosis not present

## 2018-03-22 DIAGNOSIS — T84223D Displacement of internal fixation device of bones of foot and toes, subsequent encounter: Secondary | ICD-10-CM | POA: Diagnosis not present

## 2018-03-22 DIAGNOSIS — I5042 Chronic combined systolic (congestive) and diastolic (congestive) heart failure: Secondary | ICD-10-CM | POA: Diagnosis not present

## 2018-03-22 DIAGNOSIS — T8469XD Infection and inflammatory reaction due to internal fixation device of other site, subsequent encounter: Secondary | ICD-10-CM | POA: Diagnosis not present

## 2018-03-22 DIAGNOSIS — I110 Hypertensive heart disease with heart failure: Secondary | ICD-10-CM | POA: Diagnosis not present

## 2018-03-22 DIAGNOSIS — L89613 Pressure ulcer of right heel, stage 3: Secondary | ICD-10-CM | POA: Diagnosis not present

## 2018-03-22 DIAGNOSIS — L89152 Pressure ulcer of sacral region, stage 2: Secondary | ICD-10-CM | POA: Diagnosis not present

## 2018-03-23 DIAGNOSIS — L89152 Pressure ulcer of sacral region, stage 2: Secondary | ICD-10-CM | POA: Diagnosis not present

## 2018-03-23 DIAGNOSIS — I5042 Chronic combined systolic (congestive) and diastolic (congestive) heart failure: Secondary | ICD-10-CM | POA: Diagnosis not present

## 2018-03-23 DIAGNOSIS — L89613 Pressure ulcer of right heel, stage 3: Secondary | ICD-10-CM | POA: Diagnosis not present

## 2018-03-23 DIAGNOSIS — I110 Hypertensive heart disease with heart failure: Secondary | ICD-10-CM | POA: Diagnosis not present

## 2018-03-23 DIAGNOSIS — T8469XD Infection and inflammatory reaction due to internal fixation device of other site, subsequent encounter: Secondary | ICD-10-CM | POA: Diagnosis not present

## 2018-03-23 DIAGNOSIS — T84223D Displacement of internal fixation device of bones of foot and toes, subsequent encounter: Secondary | ICD-10-CM | POA: Diagnosis not present

## 2018-03-29 DIAGNOSIS — I5042 Chronic combined systolic (congestive) and diastolic (congestive) heart failure: Secondary | ICD-10-CM | POA: Diagnosis not present

## 2018-03-29 DIAGNOSIS — T84223D Displacement of internal fixation device of bones of foot and toes, subsequent encounter: Secondary | ICD-10-CM | POA: Diagnosis not present

## 2018-03-29 DIAGNOSIS — I110 Hypertensive heart disease with heart failure: Secondary | ICD-10-CM | POA: Diagnosis not present

## 2018-03-29 DIAGNOSIS — L89613 Pressure ulcer of right heel, stage 3: Secondary | ICD-10-CM | POA: Diagnosis not present

## 2018-03-29 DIAGNOSIS — L89152 Pressure ulcer of sacral region, stage 2: Secondary | ICD-10-CM | POA: Diagnosis not present

## 2018-03-29 DIAGNOSIS — T8469XD Infection and inflammatory reaction due to internal fixation device of other site, subsequent encounter: Secondary | ICD-10-CM | POA: Diagnosis not present

## 2018-04-05 DIAGNOSIS — T8469XD Infection and inflammatory reaction due to internal fixation device of other site, subsequent encounter: Secondary | ICD-10-CM | POA: Diagnosis not present

## 2018-04-05 DIAGNOSIS — T84223D Displacement of internal fixation device of bones of foot and toes, subsequent encounter: Secondary | ICD-10-CM | POA: Diagnosis not present

## 2018-04-05 DIAGNOSIS — L89152 Pressure ulcer of sacral region, stage 2: Secondary | ICD-10-CM | POA: Diagnosis not present

## 2018-04-05 DIAGNOSIS — I5042 Chronic combined systolic (congestive) and diastolic (congestive) heart failure: Secondary | ICD-10-CM | POA: Diagnosis not present

## 2018-04-05 DIAGNOSIS — I110 Hypertensive heart disease with heart failure: Secondary | ICD-10-CM | POA: Diagnosis not present

## 2018-04-05 DIAGNOSIS — L89613 Pressure ulcer of right heel, stage 3: Secondary | ICD-10-CM | POA: Diagnosis not present

## 2018-04-11 DIAGNOSIS — L97516 Non-pressure chronic ulcer of other part of right foot with bone involvement without evidence of necrosis: Secondary | ICD-10-CM | POA: Diagnosis not present

## 2018-04-11 DIAGNOSIS — I739 Peripheral vascular disease, unspecified: Secondary | ICD-10-CM | POA: Diagnosis not present

## 2018-04-11 DIAGNOSIS — L97513 Non-pressure chronic ulcer of other part of right foot with necrosis of muscle: Secondary | ICD-10-CM | POA: Diagnosis not present

## 2018-04-12 DIAGNOSIS — I110 Hypertensive heart disease with heart failure: Secondary | ICD-10-CM | POA: Diagnosis not present

## 2018-04-12 DIAGNOSIS — T84223D Displacement of internal fixation device of bones of foot and toes, subsequent encounter: Secondary | ICD-10-CM | POA: Diagnosis not present

## 2018-04-12 DIAGNOSIS — L89152 Pressure ulcer of sacral region, stage 2: Secondary | ICD-10-CM | POA: Diagnosis not present

## 2018-04-12 DIAGNOSIS — T8469XD Infection and inflammatory reaction due to internal fixation device of other site, subsequent encounter: Secondary | ICD-10-CM | POA: Diagnosis not present

## 2018-04-12 DIAGNOSIS — I5042 Chronic combined systolic (congestive) and diastolic (congestive) heart failure: Secondary | ICD-10-CM | POA: Diagnosis not present

## 2018-04-12 DIAGNOSIS — L89613 Pressure ulcer of right heel, stage 3: Secondary | ICD-10-CM | POA: Diagnosis not present

## 2018-04-14 DIAGNOSIS — I110 Hypertensive heart disease with heart failure: Secondary | ICD-10-CM | POA: Diagnosis not present

## 2018-04-14 DIAGNOSIS — T8469XD Infection and inflammatory reaction due to internal fixation device of other site, subsequent encounter: Secondary | ICD-10-CM | POA: Diagnosis not present

## 2018-04-14 DIAGNOSIS — L89152 Pressure ulcer of sacral region, stage 2: Secondary | ICD-10-CM | POA: Diagnosis not present

## 2018-04-14 DIAGNOSIS — L89613 Pressure ulcer of right heel, stage 3: Secondary | ICD-10-CM | POA: Diagnosis not present

## 2018-04-14 DIAGNOSIS — T84223D Displacement of internal fixation device of bones of foot and toes, subsequent encounter: Secondary | ICD-10-CM | POA: Diagnosis not present

## 2018-04-14 DIAGNOSIS — I5042 Chronic combined systolic (congestive) and diastolic (congestive) heart failure: Secondary | ICD-10-CM | POA: Diagnosis not present

## 2018-04-15 DIAGNOSIS — R4020 Unspecified coma: Secondary | ICD-10-CM | POA: Diagnosis not present

## 2018-04-15 DIAGNOSIS — R404 Transient alteration of awareness: Secondary | ICD-10-CM | POA: Diagnosis not present

## 2018-04-15 DIAGNOSIS — I499 Cardiac arrhythmia, unspecified: Secondary | ICD-10-CM | POA: Diagnosis not present

## 2018-04-17 DIAGNOSIS — 419620001 Death: Secondary | SNOMED CT | POA: Diagnosis not present

## 2018-04-17 DEATH — deceased

## 2018-11-13 IMAGING — CR DG CHEST 2V
2 series · 2 of 2 positions shown · non-contrast
Comparison: 07/01/2016

CLINICAL DATA: Shortness of breath for 1 week.

EXAM:
CHEST  2 VIEW

[w chest lat]
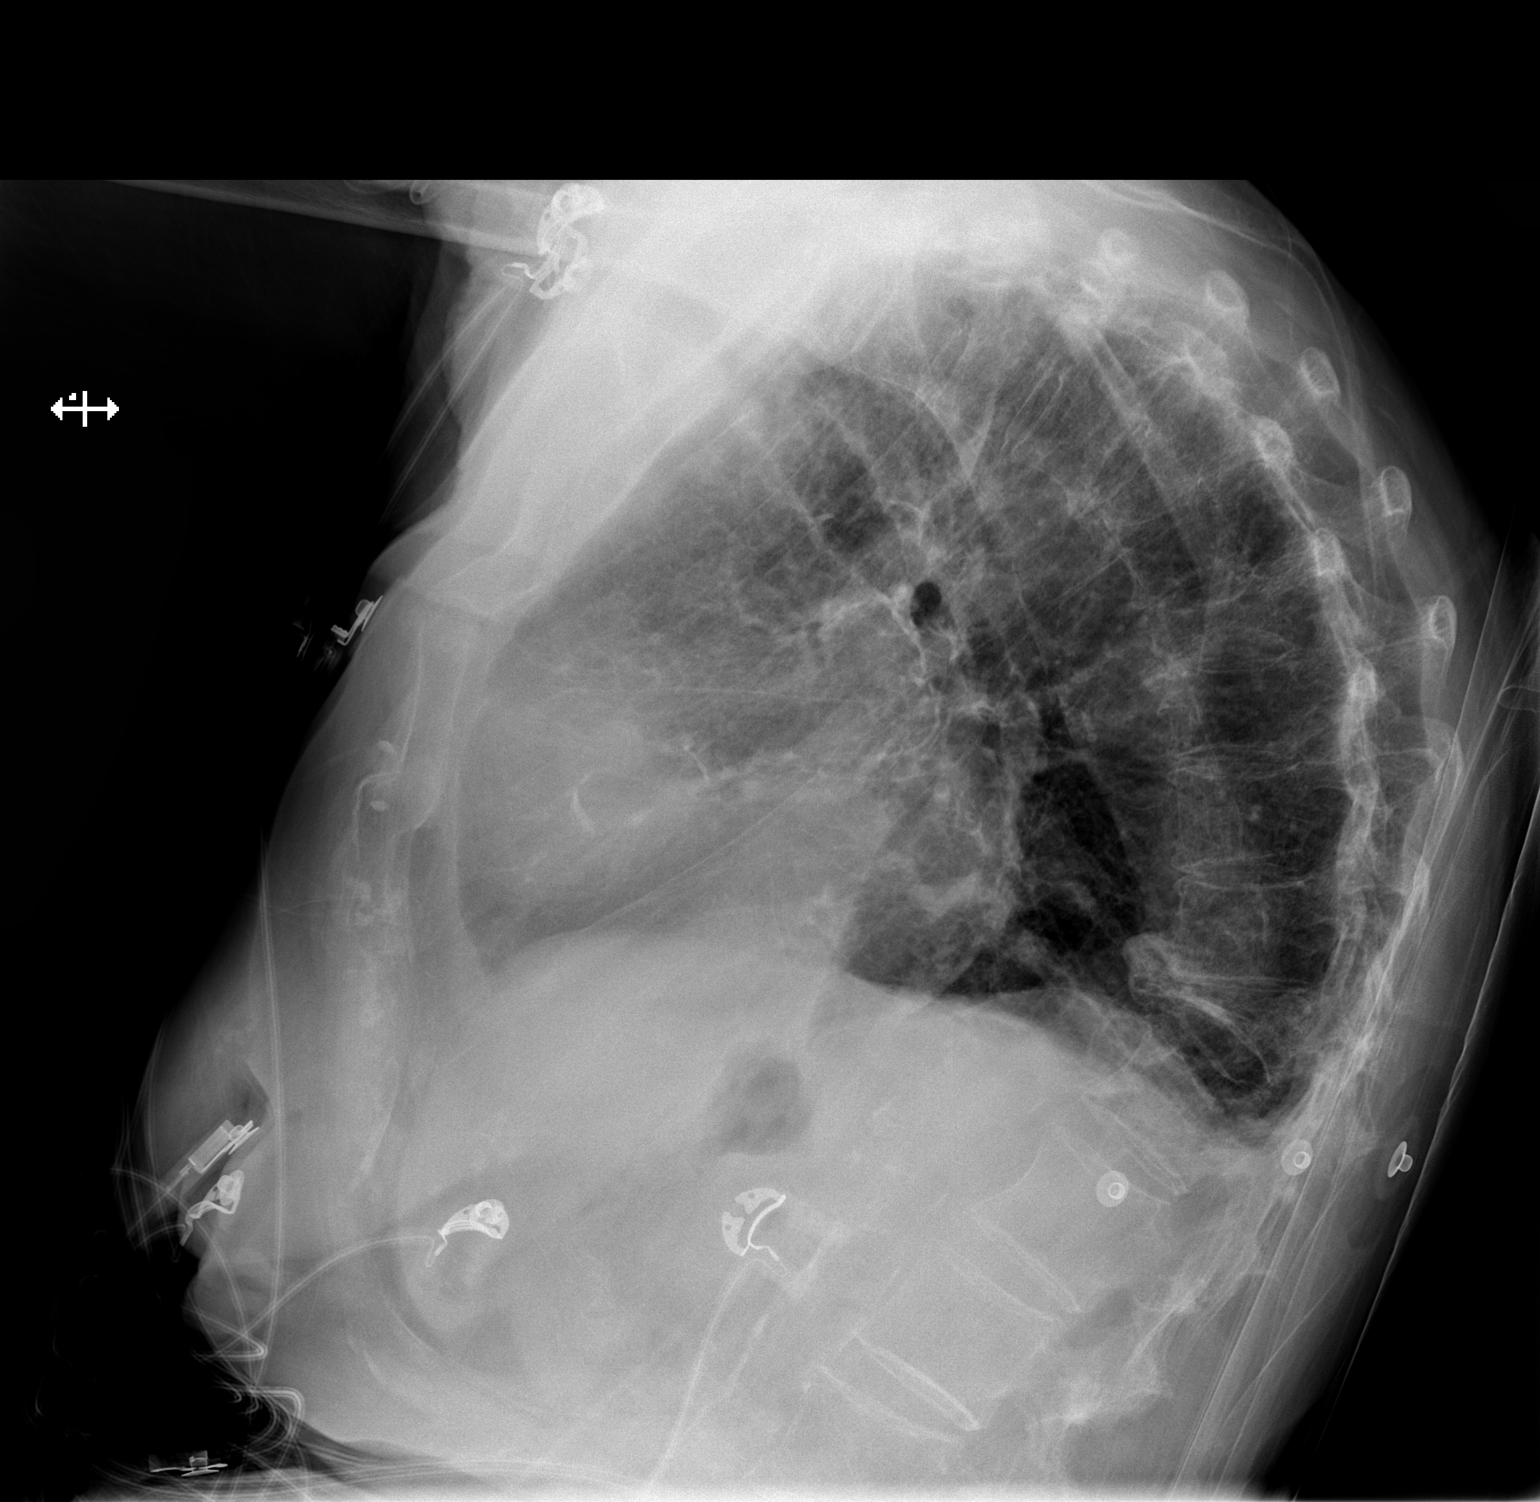

[x chest ap]
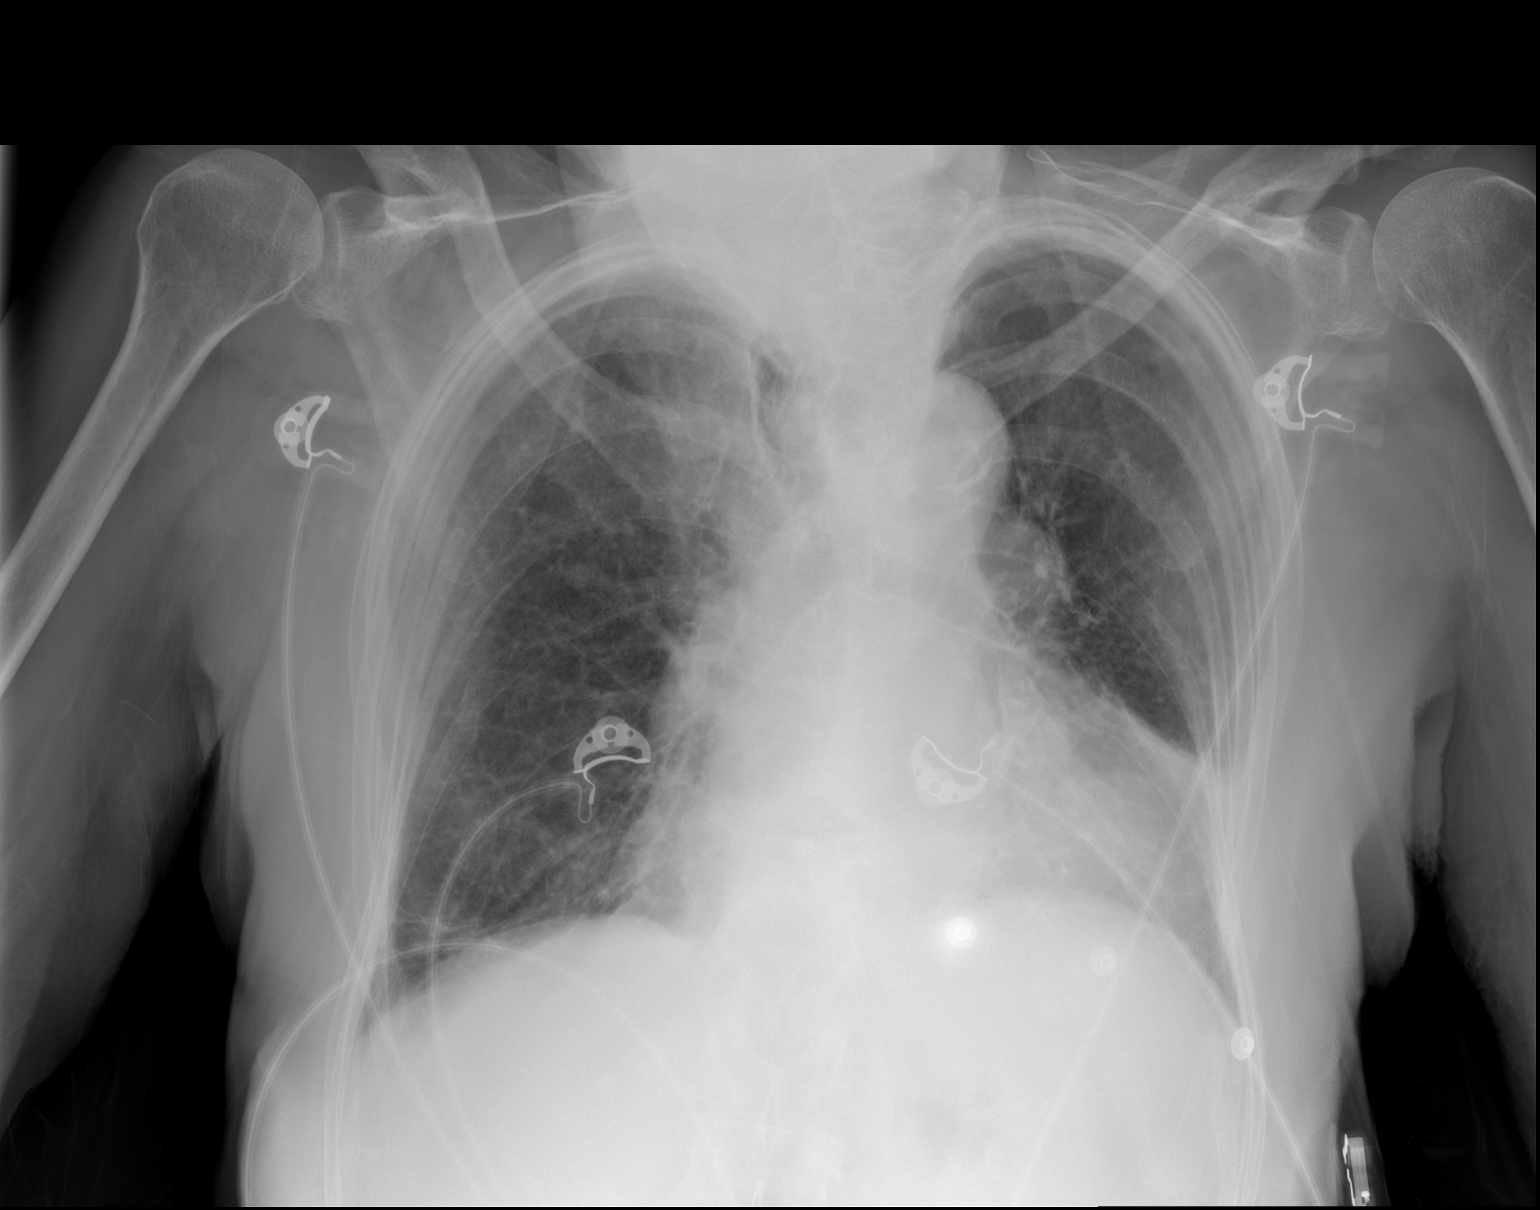

[2 of 2 positions shown; findings below may reference images not displayed]

FINDINGS: Cardiomegaly. No overt edema. No confluent opacities or effusions.
Severe compression fracture in the lower thoracic spine, stable.
IMPRESSION: Cardiomegaly.  No active disease.
